# Patient Record
Sex: Female | Born: 1960 | Race: Black or African American | Hispanic: No | Marital: Married | Smoking: Current every day smoker
Health system: Southern US, Community
[De-identification: ages and names within clinical notes are randomized; demographics above are authoritative.]

## PROBLEM LIST (undated history)

## (undated) DIAGNOSIS — R519 Headache, unspecified: Secondary | ICD-10-CM

## (undated) DIAGNOSIS — G629 Polyneuropathy, unspecified: Secondary | ICD-10-CM

## (undated) DIAGNOSIS — D649 Anemia, unspecified: Secondary | ICD-10-CM

## (undated) DIAGNOSIS — M1990 Unspecified osteoarthritis, unspecified site: Secondary | ICD-10-CM

## (undated) DIAGNOSIS — I10 Essential (primary) hypertension: Secondary | ICD-10-CM

## (undated) DIAGNOSIS — C50919 Malignant neoplasm of unspecified site of unspecified female breast: Secondary | ICD-10-CM

## (undated) DIAGNOSIS — R51 Headache: Secondary | ICD-10-CM

## (undated) DIAGNOSIS — E079 Disorder of thyroid, unspecified: Secondary | ICD-10-CM

## (undated) DIAGNOSIS — Z923 Personal history of irradiation: Secondary | ICD-10-CM

## (undated) HISTORY — DX: Unspecified osteoarthritis, unspecified site: M19.90

## (undated) HISTORY — PX: DILATION AND CURETTAGE OF UTERUS: SHX78

## (undated) HISTORY — PX: NOVASURE ABLATION: SHX5394

## (undated) HISTORY — DX: Disorder of thyroid, unspecified: E07.9

## (undated) HISTORY — DX: Polyneuropathy, unspecified: G62.9

## (undated) HISTORY — PX: ANTERIOR CRUCIATE LIGAMENT REPAIR: SHX115

## (undated) HISTORY — PX: MASTECTOMY: SHX3

## (undated) HISTORY — DX: Anemia, unspecified: D64.9

---

## 2005-03-24 ENCOUNTER — Ambulatory Visit: Payer: Self-pay | Admitting: Unknown Physician Specialty

## 2005-04-20 ENCOUNTER — Ambulatory Visit: Payer: Self-pay | Admitting: Internal Medicine

## 2005-04-30 ENCOUNTER — Ambulatory Visit: Payer: Self-pay | Admitting: Internal Medicine

## 2010-04-28 ENCOUNTER — Ambulatory Visit: Payer: Self-pay | Admitting: Internal Medicine

## 2010-05-11 ENCOUNTER — Ambulatory Visit: Payer: Self-pay | Admitting: Internal Medicine

## 2011-06-15 ENCOUNTER — Ambulatory Visit: Payer: Self-pay | Admitting: Internal Medicine

## 2012-06-15 ENCOUNTER — Ambulatory Visit: Payer: Self-pay | Admitting: Internal Medicine

## 2012-11-01 HISTORY — PX: COLONOSCOPY W/ POLYPECTOMY: SHX1380

## 2013-06-19 ENCOUNTER — Ambulatory Visit: Payer: Self-pay | Admitting: Internal Medicine

## 2013-10-18 ENCOUNTER — Ambulatory Visit: Payer: Self-pay | Admitting: Anesthesiology

## 2013-10-18 DIAGNOSIS — I10 Essential (primary) hypertension: Secondary | ICD-10-CM

## 2013-10-18 LAB — POTASSIUM: Potassium: 3.6 mmol/L (ref 3.5–5.1)

## 2013-10-19 ENCOUNTER — Ambulatory Visit: Payer: Self-pay | Admitting: Obstetrics and Gynecology

## 2013-10-19 DIAGNOSIS — R079 Chest pain, unspecified: Secondary | ICD-10-CM

## 2014-04-15 ENCOUNTER — Ambulatory Visit: Payer: Self-pay | Admitting: Gastroenterology

## 2014-05-23 ENCOUNTER — Ambulatory Visit: Payer: Self-pay | Admitting: Internal Medicine

## 2014-11-01 DIAGNOSIS — Z923 Personal history of irradiation: Secondary | ICD-10-CM

## 2014-11-01 HISTORY — DX: Personal history of irradiation: Z92.3

## 2015-02-21 NOTE — Op Note (Signed)
PATIENT NAME:  Michelle Walker, Michelle Walker MR#:  [id number] DATE OF BIRTH:  1961-09-08  DATE OF PROCEDURE:  10/19/2013  PREOPERATIVE DIAGNOSIS: Menorrhagia and anemia.   POSTOPERATIVE DIAGNOSIS: Menorrhagia and anemia.   PROCEDURE:  NovaSure endometrial ablation.   SURGEON: Elgie Collard, M.D.   ASSISTANT: None.   COMPLICATIONS: None.   ESTIMATED BLOOD LOSS: None.   FINDINGS: Normal uterine cavity. Cavity width was 3 cm, cervical length was 4 cm, cavity length 4 cm, sounding length 8 cm, the time for ablation was 2 minutes at a power of 66 watts. The patient was taken to the OR where she was prepped and draped in the usual sterile fashion in the dorsal supine lithotomy position using bumblebee stirrups. A side-opening speculum was placed in the vagina. The cervix was stabilized using a single-tooth tenaculum. The cervix was serially dilated after sounding and the NovaSure instrument was placed into the endometrial cavity. The cavity width was obtained and CO2 cavity assessment was performed and the cavity was patent. The NovaSure console was deployed and the ablation proceeded without complications. Time 2 minutes, power of 66 watts. At the conclusion of the procedure, the NovaSure instrument was removed from the uterine cavity. All other instruments were removed from the vagina. The cervix was noted to be hemostatic. The patient was returned to the dorsal supine position. She was awakened from anesthesia in the operating room in stable condition and returned to the PACU for postoperative care. Discharge to home with office follow-up.    ____________________________ Shelby Mattocks. Georga Bora, MD [id number] D: 10/19/2013 12:46:11 ET T: 10/19/2013 13:17:09 ET JOB#: [id number]  cc: Shelby Mattocks. Georga Bora, MD, <Dictator>

## 2015-05-27 ENCOUNTER — Other Ambulatory Visit: Payer: Self-pay | Admitting: Internal Medicine

## 2015-05-27 DIAGNOSIS — Z1231 Encounter for screening mammogram for malignant neoplasm of breast: Secondary | ICD-10-CM

## 2015-06-03 ENCOUNTER — Ambulatory Visit: Payer: Managed Care, Other (non HMO) | Attending: Internal Medicine

## 2015-06-24 ENCOUNTER — Ambulatory Visit
Admission: RE | Admit: 2015-06-24 | Discharge: 2015-06-24 | Disposition: A | Discharge status: Home / Self Care | Payer: Managed Care, Other (non HMO) | Source: Ambulatory Visit | Attending: Internal Medicine | Admitting: Internal Medicine

## 2015-06-24 DIAGNOSIS — Z1231 Encounter for screening mammogram for malignant neoplasm of breast: Secondary | ICD-10-CM | POA: Insufficient documentation

## 2015-06-24 DIAGNOSIS — R921 Mammographic calcification found on diagnostic imaging of breast: Secondary | ICD-10-CM | POA: Diagnosis not present

## 2015-06-27 ENCOUNTER — Other Ambulatory Visit: Payer: Self-pay | Admitting: Internal Medicine

## 2015-06-27 DIAGNOSIS — R928 Other abnormal and inconclusive findings on diagnostic imaging of breast: Secondary | ICD-10-CM

## 2015-06-27 DIAGNOSIS — R921 Mammographic calcification found on diagnostic imaging of breast: Secondary | ICD-10-CM

## 2015-07-04 ENCOUNTER — Ambulatory Visit
Admission: RE | Admit: 2015-07-04 | Discharge: 2015-07-04 | Disposition: A | Discharge status: Home / Self Care | Payer: Managed Care, Other (non HMO) | Source: Ambulatory Visit | Attending: Internal Medicine | Admitting: Internal Medicine

## 2015-07-04 DIAGNOSIS — R928 Other abnormal and inconclusive findings on diagnostic imaging of breast: Secondary | ICD-10-CM | POA: Diagnosis present

## 2015-07-04 DIAGNOSIS — R921 Mammographic calcification found on diagnostic imaging of breast: Secondary | ICD-10-CM | POA: Insufficient documentation

## 2015-07-08 ENCOUNTER — Other Ambulatory Visit: Payer: Self-pay | Admitting: Internal Medicine

## 2015-07-08 DIAGNOSIS — R921 Mammographic calcification found on diagnostic imaging of breast: Secondary | ICD-10-CM

## 2015-07-08 DIAGNOSIS — R928 Other abnormal and inconclusive findings on diagnostic imaging of breast: Secondary | ICD-10-CM

## 2015-07-15 ENCOUNTER — Ambulatory Visit: Payer: Managed Care, Other (non HMO)

## 2015-07-17 ENCOUNTER — Ambulatory Visit
Admission: RE | Admit: 2015-07-17 | Discharge: 2015-07-17 | Disposition: A | Discharge status: Home / Self Care | Payer: Managed Care, Other (non HMO) | Source: Ambulatory Visit | Attending: Internal Medicine | Admitting: Internal Medicine

## 2015-07-17 DIAGNOSIS — R921 Mammographic calcification found on diagnostic imaging of breast: Secondary | ICD-10-CM

## 2015-07-17 DIAGNOSIS — R928 Other abnormal and inconclusive findings on diagnostic imaging of breast: Secondary | ICD-10-CM | POA: Insufficient documentation

## 2015-07-17 HISTORY — PX: BREAST BIOPSY: SHX20

## 2015-07-28 ENCOUNTER — Encounter: Payer: Self-pay | Admitting: General Surgery

## 2015-07-29 ENCOUNTER — Telehealth: Payer: Self-pay

## 2015-07-29 NOTE — Telephone Encounter (Signed)
  Oncology Nurse Navigator Documentation    Navigator Encounter Type: Introductory phone call (07/29/15 1700) Patient Visit Type: Initial (07/29/15 1700)   Barriers/Navigation Needs: Education (07/29/15 1700)   Interventions: Coordination of Care;Education Method (Breast Cancer Treatment Handbook) (07/29/15 1700)   Coordination of Care: MD Appointments (07/29/15 1700) Education Method: Verbal (07/29/15 1700)      Time Spent with Patient: 15 (07/29/15 1700)  Phoned patient to introduce Navigation service.  She is scheduled for surgical consult on 08/04/15 with Dr Jamal Collin. Appointment is at 3:00 p.m.   Risk assessment completed for case conference.  Per patient request Breast Cancer Treatment Handbook delivered to [facility] to pick up at consult visit.

## 2015-08-02 DIAGNOSIS — C50919 Malignant neoplasm of unspecified site of unspecified female breast: Secondary | ICD-10-CM

## 2015-08-02 HISTORY — DX: Malignant neoplasm of unspecified site of unspecified female breast: C50.919

## 2015-08-04 ENCOUNTER — Ambulatory Visit (INDEPENDENT_AMBULATORY_CARE_PROVIDER_SITE_OTHER): Payer: Managed Care, Other (non HMO) | Admitting: General Surgery

## 2015-08-04 ENCOUNTER — Encounter: Payer: Self-pay | Admitting: General Surgery

## 2015-08-04 DIAGNOSIS — C50412 Malignant neoplasm of upper-outer quadrant of left female breast: Secondary | ICD-10-CM | POA: Insufficient documentation

## 2015-08-04 DIAGNOSIS — Z170 Estrogen receptor positive status [ER+]: Secondary | ICD-10-CM | POA: Insufficient documentation

## 2015-08-04 DIAGNOSIS — C50411 Malignant neoplasm of upper-outer quadrant of right female breast: Secondary | ICD-10-CM

## 2015-08-04 LAB — SURGICAL PATHOLOGY

## 2015-08-04 NOTE — Progress Notes (Signed)
Patient ID: Michelle Walker, female   DOB: 04-29-1961, 53 y.o.   MRN: [id number]  Chief Complaint  Patient presents with  . Breast Cancer    HPI Michelle Walker is a 54 y.o. female here today for an evaluation for right breast cancer, she had a mammogram 06/24/15 and a  right breast stereotactic biopsy done 07/24/15. She states she did not feel any lumps or tenderness prior to getting her yearly checkup.  She states that sometimes she has to have two mammograms a year. 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  HPI  Past Medical History  Diagnosis Date  . Arthritis   . Anemia   . Thyroid disease   . Hypertension   . Cancer [facility])     Past Surgical History  Procedure Laterality Date  . Breast biopsy Right 07/17/2015    path pending  . Colonoscopy w/ polypectomy  2014  . Dilation and curettage of uterus      20 years ago    Family History  Problem Relation Age of Onset  . Stroke Mother   . Cancer Sister     sarcoma on arm; chemo    Social History Social History  Substance Use Topics  . Smoking status: Current Every Day Smoker -- 0.50 packs/day for 35 years    Types: Cigarettes  . Smokeless tobacco: Never Used  . Alcohol Use: 1.2 oz/week    2 Standard drinks or equivalent per week    No Known Allergies  Current Outpatient Prescriptions  Medication Sig Dispense Refill  . amLODipine (NORVASC) 10 MG tablet Take 10 mg by mouth at bedtime.   0  . losartan (COZAAR) 100 MG tablet Take 100 mg by mouth at bedtime.   0  . metoprolol succinate (TOPROL-XL) 25 MG 24 hr tablet Take 25 mg by mouth at bedtime.   0  . Vitamin D, Ergocalciferol, (DRISDOL) 50000 UNITS CAPS capsule Take 50,000 Units by mouth every 7 (seven) days. On Thursday  0  . allopurinol (ZYLOPRIM) 100 MG tablet Take 100 mg by mouth at bedtime.    [name] HYDROcodone-acetaminophen (NORCO/VICODIN) 5-325 MG tablet Take 1 tablet by mouth every 4 (four) hours as needed for moderate pain. 20 tablet 0    No current facility-administered medications for this visit.    Review of Systems Review of Systems  Constitutional: Negative.   Respiratory: Negative.   Cardiovascular: Negative.     Blood pressure 140/70, pulse 82, resp. rate 12, height 5' 5" (1.651 m), weight 190 lb (86.183 kg).  Physical Exam Physical Exam  Constitutional: She is oriented to person, place, and time. She appears well-developed and well-nourished.  Eyes: Conjunctivae are normal. No scleral icterus.  Neck: Neck supple.  Cardiovascular: Normal rate, regular rhythm and normal heart sounds.   Pulmonary/Chest: Effort normal and breath sounds normal. Right breast exhibits no inverted nipple, no mass, no nipple discharge, no skin change and no tenderness. Left breast exhibits no inverted nipple, no mass, no nipple discharge, no skin change and no tenderness.  Abdominal: Soft. Bowel sounds are normal.  Lymphadenopathy:    She has no cervical adenopathy.    She has no axillary adenopathy.  Neurological: She is alert and oriented to person, place, and time.  Skin: Skin is warm and dry.    Data Reviewed Mammogram and pathology report invasive lobular cancer. Imaging size is 7-56m. T1b,N0 Assessment    Right breast invasive lobular breast cancer, ER+/PR+. Her2 results  being faxed today.    Plan   Traeatment options discussed. Lumpectomy and sentinel node biopsy of right breast recommended, followed by radiation and antihormonal therapy Additional treatment dependent on Her2 results.       PCP: Michelle Carls, MD   Michelle Walker 08/05/2015, 1:11 PM

## 2015-08-04 NOTE — Patient Instructions (Signed)
Lumpectomy A lumpectomy is a form of "breast conserving" or "breast preservation" surgery. It may also be referred to as a partial mastectomy. During a lumpectomy, the portion of the breast that contains the cancerous tumor or breast mass (the lump) is removed. Some normal tissue around the lump may also be removed to make sure all of the tumor has been removed.  LET YOUR HEALTH CARE PROVIDER KNOW ABOUT:  Any allergies you have.  All medicines you are taking, including vitamins, herbs, eye drops, creams, and over-the-counter medicines.  Previous problems you or members of your family have had with the use of anesthetics.  Any blood disorders you have.  Previous surgeries you have had.  Medical conditions you have. RISKS AND COMPLICATIONS Generally, this is a safe procedure. However, problems can occur and include:  Bleeding.  Infection.  Pain.  Temporary swelling.  Change in the shape of the breast, particularly if a large portion is removed. BEFORE THE PROCEDURE  Ask your health care provider about changing or stopping your regular medicines. This is especially important if you are taking diabetes medicines or blood thinners.  Do not eat or drink anything after midnight on the night before the procedure or as directed by your health care provider. Ask your health care provider if you can take a sip of water with any approved medicines.  On the day of surgery, your health care provider will use a mammogram or ultrasound to locate and mark the tumor in your breast. These markings on your breast will show where the cut (incision) will be made. PROCEDURE   An IV tube will be put into one of your veins.  You may be given medicine to help you relax before the surgery (sedative). You will be given one of the following:  A medicine that numbs the area (local anesthetic).  A medicine that makes you fall asleep (general anesthetic).  Your health care provider will use a kind of  electric scalpel that uses heat to minimize bleeding (electrocautery knife).  A curved incision (like a smile or frown) that follows the natural curve of your breast is made, to allow for minimal scarring and better healing.  The tumor will be removed with some of the surrounding tissue. This will be sent to the lab for analysis. Your health care provider may also remove your lymph nodes at this time if needed.  Sometimes, but not always, a rubber tube called a drain will be surgically inserted into your breast area or armpit to collect excess fluid that may accumulate in the space where the tumor was. This drain is connected to a plastic bulb on the outside of your body. This drain creates suction to help remove the fluid.  The incisions will be closed with stitches (sutures).  A bandage may be placed over the incisions. AFTER THE PROCEDURE  You will be taken to the recovery area.  You will be given medicine for pain.  A small rubber drain may be placed in the breast for 2-3 days to prevent a collection of blood (hematoma) from developing in the breast. You will be given instructions on caring for the drain before you go home.  A pressure bandage (dressing) will be applied for 1-2 days to prevent bleeding. Ask your health care provider how to care for your bandage at home. Document Released: 11/29/2006 Document Revised: 03/04/2014 Document Reviewed: 03/23/2013 ExitCare Patient Information 2015 [employer]. This information is not intended to replace advice given to you by   your health care provider. Make sure you discuss any questions you have with your health care provider.  

## 2015-08-05 ENCOUNTER — Encounter: Payer: Self-pay | Admitting: General Surgery

## 2015-08-05 ENCOUNTER — Encounter: Payer: Self-pay | Admitting: Emergency Medicine

## 2015-08-05 ENCOUNTER — Emergency Department
Admission: EM | Admit: 2015-08-05 | Discharge: 2015-08-05 | Disposition: A | Discharge status: Home / Self Care | Payer: Managed Care, Other (non HMO) | Attending: Emergency Medicine | Admitting: Emergency Medicine

## 2015-08-05 ENCOUNTER — Emergency Department: Payer: Managed Care, Other (non HMO)

## 2015-08-05 DIAGNOSIS — R1032 Left lower quadrant pain: Secondary | ICD-10-CM | POA: Diagnosis present

## 2015-08-05 DIAGNOSIS — R109 Unspecified abdominal pain: Secondary | ICD-10-CM

## 2015-08-05 DIAGNOSIS — Z79899 Other long term (current) drug therapy: Secondary | ICD-10-CM | POA: Diagnosis not present

## 2015-08-05 DIAGNOSIS — Z720 Tobacco use: Secondary | ICD-10-CM | POA: Diagnosis not present

## 2015-08-05 HISTORY — DX: Essential (primary) hypertension: I10

## 2015-08-05 LAB — COMPREHENSIVE METABOLIC PANEL
ALT: 12 U/L — ABNORMAL LOW (ref 14–54)
AST: 23 U/L (ref 15–41)
Albumin: 3.5 g/dL (ref 3.5–5.0)
Alkaline Phosphatase: 61 U/L (ref 38–126)
Anion gap: 3 — ABNORMAL LOW (ref 5–15)
BUN: 14 mg/dL (ref 6–20)
CO2: 23 mmol/L (ref 22–32)
Calcium: 9 mg/dL (ref 8.9–10.3)
Chloride: 111 mmol/L (ref 101–111)
Creatinine, Ser: 0.77 mg/dL (ref 0.44–1.00)
GFR calc Af Amer: >60 mL/min (ref >60)
GFR calc non Af Amer: >60 mL/min (ref >60)
Glucose, Bld: 100 mg/dL — ABNORMAL HIGH (ref 65–99)
Potassium: 3.8 mmol/L (ref 3.5–5.1)
Sodium: 137 mmol/L (ref 135–145)
Total Bilirubin: 0.5 mg/dL (ref 0.3–1.2)
Total Protein: 6.8 g/dL (ref 6.5–8.1)

## 2015-08-05 LAB — URINALYSIS COMPLETE WITH MICROSCOPIC (ARMC ONLY)
Bilirubin Urine: NEGATIVE
Glucose, UA: NEGATIVE mg/dL
Hgb urine dipstick: NEGATIVE
Ketones, ur: NEGATIVE mg/dL
Leukocytes, UA: NEGATIVE
Nitrite: NEGATIVE
Protein, ur: NEGATIVE mg/dL
RBC / HPF: NONE SEEN RBC/hpf (ref 0–5)
Specific Gravity, Urine: 1.019 (ref 1.005–1.030)
pH: 5 (ref 5.0–8.0)

## 2015-08-05 LAB — CBC WITH DIFFERENTIAL/PLATELET
Basophils Absolute: 0 10*3/uL (ref 0–0.1)
Basophils Relative: 1 %
Eosinophils Absolute: 0.2 10*3/uL (ref 0–0.7)
Eosinophils Relative: 3 %
HCT: 39.4 % (ref 35.0–47.0)
Hemoglobin: 12.9 g/dL (ref 12.0–16.0)
Lymphocytes Relative: 29 %
Lymphs Abs: 1.9 10*3/uL (ref 1.0–3.6)
MCH: 27 pg (ref 26.0–34.0)
MCHC: 32.8 g/dL (ref 32.0–36.0)
MCV: 82.5 fL (ref 80.0–100.0)
Monocytes Absolute: 0.6 10*3/uL (ref 0.2–0.9)
Monocytes Relative: 9 %
Neutro Abs: 4 10*3/uL (ref 1.4–6.5)
Neutrophils Relative %: 58 %
Platelets: 172 10*3/uL (ref 150–440)
RBC: 4.78 MIL/uL (ref 3.80–5.20)
RDW: 13.2 % (ref 11.5–14.5)
WBC: 6.7 10*3/uL (ref 3.6–11.0)

## 2015-08-05 LAB — LIPASE, BLOOD: Lipase: 36 U/L (ref 22–51)

## 2015-08-05 MED ORDER — HYDROCODONE-ACETAMINOPHEN 5-325 MG PO TABS: 1.0000 | ORAL_TABLET | ORAL | Status: DC | PRN

## 2015-08-05 MED ORDER — ONDANSETRON HCL 4 MG/2ML IJ SOLN
4.0000 mg | Freq: Once | INTRAMUSCULAR | Status: AC
  Administered 2015-08-05: 4 mg via INTRAVENOUS
  Filled 2015-08-05: qty 2

## 2015-08-05 MED ORDER — MORPHINE SULFATE (PF) 4 MG/ML IV SOLN
4.0000 mg | Freq: Once | INTRAVENOUS | Status: AC
  Administered 2015-08-05: 4 mg via INTRAVENOUS
  Filled 2015-08-05: qty 1

## 2015-08-05 MED ORDER — IOHEXOL 240 MG/ML SOLN
25.0000 mL | Freq: Once | INTRAMUSCULAR | Status: AC | PRN
  Administered 2015-08-05: 25 mL via ORAL

## 2015-08-05 MED ORDER — IOHEXOL 300 MG/ML  SOLN
100.0000 mL | Freq: Once | INTRAMUSCULAR | Status: AC | PRN
  Administered 2015-08-05: 100 mL via INTRAVENOUS

## 2015-08-05 NOTE — ED Provider Notes (Signed)
Assumed care from Dr. Archie Balboa. He has been follow-up on the CT scan. CT scan shows no acute abnormalities. I discussed with the patient that cause of her pain is unclear although she reports she has no pain now. I just we do a pelvic ultrasound which she declined and stated she would rather go home and rest. She will return to the ED if her pain returns.  Michelle Drafts, MD 08/05/15 [phone]

## 2015-08-05 NOTE — ED Notes (Signed)
Pt arrived to the ED via EMS from work for severe abdominal pain. Pt states that she went to the bathroom and after she was done, she began to experience LLQ abdominal pain rated at a 10 in a 0-10 pain scale. Pt states that she never had kidney stones but was diagnosed about 1 year ago with gallbladder stones. Pt is AOx4 in severe pain distress. MD notified.

## 2015-08-05 NOTE — ED Provider Notes (Signed)
[facility] Emergency Department Provider Note   ____________________________________________  Time seen: 0525  I have reviewed the triage vital signs and the nursing notes.   HISTORY  Chief Complaint Abdominal Pain   History limited by: Not Limited   HPI Michelle Walker is a 54 y.o. female who presents to the emergency department today with concerns for left lower quadrant pain. The patient states that this pain started abruptly. She states she just finished a bowel movement. She describes the pain as sharp. It is located in the left lower quadrant with some radiation to the rectum. She denies any abnormality about her bowel movement. She denies any blood or diarrhea with the bowel movement. She denies having similar pain in the past. She denies any nausea or vomiting.No recent fevers.    Past Medical History  Diagnosis Date  . Arthritis   . Anemia   . Thyroid disease     Patient Active Problem List   Diagnosis Date Noted  . Breast cancer of upper-outer quadrant of right female breast ([place]) 08/04/2015    Past Surgical History  Procedure Laterality Date  . Breast biopsy Right 07/17/2015    path pending  . Colonoscopy w/ polypectomy  2014  . Dilation and curettage of uterus      20 years ago    Current Outpatient Rx  Name  Route  Sig  Dispense  Refill  . amLODipine (NORVASC) 10 MG tablet   Oral   Take 10 mg by mouth daily.      0   . losartan (COZAAR) 100 MG tablet   Oral   Take 100 mg by mouth daily.       0   . metoprolol succinate (TOPROL-XL) 25 MG 24 hr tablet   Oral   Take 25 mg by mouth daily.       0   . Vitamin D, Ergocalciferol, (DRISDOL) 50000 UNITS CAPS capsule      50,000 Units every 7 (seven) days.       0     Allergies Review of patient's allergies indicates no known allergies.  Family History  Problem Relation Age of Onset  . Stroke Mother   . Cancer Sister     sarcoma on arm; chemo    Social  History Social History  Substance Use Topics  . Smoking status: Current Every Day Smoker -- 0.50 packs/day for 35 years    Types: Cigarettes  . Smokeless tobacco: Never Used  . Alcohol Use: 1.2 oz/week    2 Standard drinks or equivalent per week    Review of Systems  Constitutional: Negative for fever. Cardiovascular: Negative for chest pain. Respiratory: Negative for shortness of breath. Gastrointestinal: Positive for left lower quadrant pain  Genitourinary: Negative for dysuria. Musculoskeletal: Negative for back pain. Skin: Negative for rash. Neurological: Negative for headaches, focal weakness or numbness.   10-point ROS otherwise negative.  ____________________________________________   PHYSICAL EXAM:  VITAL SIGNS: ED Triage Vitals  Enc Vitals Group     BP 08/05/15 0508 124/84 mmHg     Pulse Rate 08/05/15 0508 86     Resp 08/05/15 0508 18     Temp 08/05/15 0508 98.1 F (36.7 C)     Temp Source 08/05/15 0508 Oral     SpO2 08/05/15 0508 97 %     Weight 08/05/15 0508 190 lb (86.183 kg)     Height 08/05/15 0508 5\' 6"  (1.676 m)     Head Cir --  Peak Flow --      Pain Score 08/05/15 0509 8   Constitutional: Alert and oriented. Well appearing and in no distress. Eyes: Conjunctivae are normal. PERRL. Normal extraocular movements. ENT   Head: Normocephalic and atraumatic.   Nose: No congestion/rhinnorhea.   Mouth/Throat: Mucous membranes are moist.   Neck: No stridor. Hematological/Lymphatic/Immunilogical: No cervical lymphadenopathy. Cardiovascular: Normal rate, regular rhythm.  No murmurs, rubs, or gallops. Respiratory: Normal respiratory effort without tachypnea nor retractions. Breath sounds are clear and equal bilaterally. No wheezes/rales/rhonchi. Gastrointestinal: Soft and tender to palpation of the left lower quadrant. No distention. Genitourinary: Deferred Musculoskeletal: Normal range of motion in all extremities. No joint effusions.  No  lower extremity tenderness nor edema. Neurologic:  Normal speech and language. No gross focal neurologic deficits are appreciated. Speech is normal.  Skin:  Skin is warm, dry and intact. No rash noted. Psychiatric: Mood and affect are normal. Speech and behavior are normal. Patient exhibits appropriate insight and judgment.  ____________________________________________    LABS (pertinent positives/negatives)  Labs Reviewed  COMPREHENSIVE METABOLIC PANEL - Abnormal; Notable for the following:    Glucose, Bld 100 (*)    ALT 12 (*)    Anion gap 3 (*)    All other components within normal limits  URINALYSIS COMPLETEWITH MICROSCOPIC ([facility] ONLY) - Abnormal; Notable for the following:    Color, Urine YELLOW (*)    APPearance CLEAR (*)    Bacteria, UA RARE (*)    Squamous Epithelial / LPF 0-5 (*)    All other components within normal limits  CBC WITH DIFFERENTIAL/PLATELET  LIPASE, BLOOD     ____________________________________________   EKG  None  ____________________________________________    RADIOLOGY  CT scan pending ____________________________________________   PROCEDURES  Procedure(s) performed: None  Critical Care performed: No  ____________________________________________   INITIAL IMPRESSION / ASSESSMENT AND PLAN / ED COURSE  Pertinent labs & imaging results that were available during my care of the patient were reviewed by me and considered in my medical decision making (see chart for details).  Patient presents to the emergency department today with a left lower quadrant abdominal pain. Urine without any red blood cells or white Cells. Blood Results without Any concerning Findings. However Given the Significance of Pain Will Plan on Getting a CT Scan to Evaluate for Possible Diverticulitis or other intraabdominal source of pain.  ____________________________________________   FINAL CLINICAL IMPRESSION(S) / ED DIAGNOSES  Abdominal pain  Nance Pear, MD 08/05/15 [phone]

## 2015-08-05 NOTE — Discharge Instructions (Signed)
Abdominal Pain, Women °Abdominal (stomach, pelvic, or belly) pain can be caused by many things. It is important to tell your doctor: °· The location of the pain. °· Does it come and go or is it present all the time? °· Are there things that start the pain (eating certain foods, exercise)? °· Are there other symptoms associated with the pain (fever, nausea, vomiting, diarrhea)? °All of this is helpful to know when trying to find the cause of the pain. °CAUSES  °· Stomach: virus or bacteria infection, or ulcer. °· Intestine: appendicitis (inflamed appendix), regional ileitis (Crohn's disease), ulcerative colitis (inflamed colon), irritable bowel syndrome, diverticulitis (inflamed diverticulum of the colon), or cancer of the stomach or intestine. °· Gallbladder disease or stones in the gallbladder. °· Kidney disease, kidney stones, or infection. °· Pancreas infection or cancer. °· Fibromyalgia (pain disorder). °· Diseases of the female organs: °¨ Uterus: fibroid (non-cancerous) tumors or infection. °¨ Fallopian tubes: infection or tubal pregnancy. °¨ Ovary: cysts or tumors. °¨ Pelvic adhesions (scar tissue). °¨ Endometriosis (uterus lining tissue growing in the pelvis and on the pelvic organs). °¨ Pelvic congestion syndrome (female organs filling up with blood just before the menstrual period). °¨ Pain with the menstrual period. °¨ Pain with ovulation (producing an egg). °¨ Pain with an IUD (intrauterine device, birth control) in the uterus. °¨ Cancer of the female organs. °· Functional pain (pain not caused by a disease, may improve without treatment). °· Psychological pain. °· Depression. °DIAGNOSIS  °Your doctor will decide the seriousness of your pain by doing an examination. °· Blood tests. °· X-rays. °· Ultrasound. °· CT scan (computed tomography, special type of X-ray). °· MRI (magnetic resonance imaging). °· Cultures, for infection. °· Barium enema (dye inserted in the large intestine, to better view it with  X-rays). °· Colonoscopy (looking in intestine with a lighted tube). °· Laparoscopy (minor surgery, looking in abdomen with a lighted tube). °· Major abdominal exploratory surgery (looking in abdomen with a large incision). °TREATMENT  °The treatment will depend on the cause of the pain.  °· Many cases can be observed and treated at home. °· Over-the-counter medicines recommended by your caregiver. °· Prescription medicine. °· Antibiotics, for infection. °· Birth control pills, for painful periods or for ovulation pain. °· Hormone treatment, for endometriosis. °· Nerve blocking injections. °· Physical therapy. °· Antidepressants. °· Counseling with a psychologist or psychiatrist. °· Minor or major surgery. °HOME CARE INSTRUCTIONS  °· Do not take laxatives, unless directed by your caregiver. °· Take over-the-counter pain medicine only if ordered by your caregiver. Do not take aspirin because it can cause an upset stomach or bleeding. °· Try a clear liquid diet (broth or water) as ordered by your caregiver. Slowly move to a bland diet, as tolerated, if the pain is related to the stomach or intestine. °· Have a thermometer and take your temperature several times a day, and record it. °· Bed rest and sleep, if it helps the pain. °· Avoid sexual intercourse, if it causes pain. °· Avoid stressful situations. °· Keep your follow-up appointments and tests, as your caregiver orders. °· If the pain does not go away with medicine or surgery, you may try: °¨ Acupuncture. °¨ Relaxation exercises (yoga, meditation). °¨ Group therapy. °¨ Counseling. °SEEK MEDICAL CARE IF:  °· You notice certain foods cause stomach pain. °· Your home care treatment is not helping your pain. °· You need stronger pain medicine. °· You want your IUD removed. °· You feel faint or   lightheaded. °· You develop nausea and vomiting. °· You develop a rash. °· You are having side effects or an allergy to your medicine. °SEEK IMMEDIATE MEDICAL CARE IF:  °· Your  pain does not go away or gets worse. °· You have a fever. °· Your pain is felt only in portions of the abdomen. The right side could possibly be appendicitis. The left lower portion of the abdomen could be colitis or diverticulitis. °· You are passing blood in your stools (bright red or black tarry stools, with or without vomiting). °· You have blood in your urine. °· You develop chills, with or without a fever. °· You pass out. °MAKE SURE YOU:  °· Understand these instructions. °· Will watch your condition. °· Will get help right away if you are not doing well or get worse. °Document Released: 08/15/2007 Document Revised: 03/04/2014 Document Reviewed: 09/04/2009 °ExitCare® Patient Information ©2015 [employer]. This information is not intended to replace advice given to you by your health care provider. Make sure you discuss any questions you have with your health care provider. ° °

## 2015-08-05 NOTE — ED Notes (Signed)
Pt resting in bed.

## 2015-08-06 ENCOUNTER — Other Ambulatory Visit: Payer: Self-pay | Admitting: General Surgery

## 2015-08-06 DIAGNOSIS — C50411 Malignant neoplasm of upper-outer quadrant of right female breast: Secondary | ICD-10-CM

## 2015-08-08 ENCOUNTER — Other Ambulatory Visit: Payer: Self-pay | Admitting: General Surgery

## 2015-08-08 DIAGNOSIS — C50411 Malignant neoplasm of upper-outer quadrant of right female breast: Secondary | ICD-10-CM

## 2015-08-13 MED ORDER — LIDOCAINE HCL (PF) 1 % IJ SOLN
INTRAMUSCULAR | Status: AC
  Filled 2015-08-13: qty 5

## 2015-08-14 ENCOUNTER — Other Ambulatory Visit: Payer: Managed Care, Other (non HMO)

## 2015-08-18 ENCOUNTER — Encounter: Payer: Self-pay | Admitting: *Deleted

## 2015-08-18 NOTE — Patient Instructions (Signed)
  Your procedure is scheduled on: 08-22-15 Report to [facility] (2ND DESK ON RIGHT) @ 8 AM   Remember: Instructions that are not followed completely may result in serious medical risk, up to and including death, or upon the discretion of your surgeon and anesthesiologist your surgery may need to be rescheduled.    _X___ 1. Do not eat food or drink liquids after midnight. No gum chewing or hard candies.     _X___ 2. No Alcohol for 24 hours before or after surgery.   ____ 3. Bring all medications with you on the day of surgery if instructed.    _X___ 4. Notify your doctor if there is any change in your medical condition     (cold, fever, infections).     Do not wear jewelry, make-up, hairpins, clips or nail polish.  Do not wear lotions, powders, or perfumes. You may wear deodorant.  Do not shave 48 hours prior to surgery. Men may shave face and neck.  Do not bring valuables to the hospital.    [facility] is not responsible for any belongings or valuables.               Contacts, dentures or bridgework may not be worn into surgery.  Leave your suitcase in the car. After surgery it may be brought to your room.  For patients admitted to the hospital, discharge time is determined by your treatment team.   Patients discharged the day of surgery will not be allowed to drive home.   Please read over the following fact sheets that you were given:      ____ Take these medicines the morning of surgery with A SIP OF WATER:    1. NONE  2.   3.   4.  5.  6.  ____ Fleet Enema (as directed)   ____ Use CHG Soap as directed  ____ Use inhalers on the day of surgery  ____ Stop metformin 2 days prior to surgery    ____ Take 1/2 of usual insulin dose the night before surgery and none on the morning of surgery.   ____ Stop Coumadin/Plavix/aspirin-N/A  ____ Stop Anti-inflammatories-NO NSAIDS OR ASPIRIN PRODUCTS-TYLENOL OK   ____ Stop supplements until after surgery.     ____ Bring C-Pap to the hospital.

## 2015-08-19 ENCOUNTER — Encounter
Admission: RE | Admit: 2015-08-19 | Discharge: 2015-08-19 | Disposition: A | Discharge status: Home / Self Care | Payer: Managed Care, Other (non HMO) | Source: Ambulatory Visit | Attending: Anesthesiology | Admitting: Anesthesiology

## 2015-08-19 DIAGNOSIS — Z823 Family history of stroke: Secondary | ICD-10-CM | POA: Diagnosis not present

## 2015-08-19 DIAGNOSIS — E079 Disorder of thyroid, unspecified: Secondary | ICD-10-CM | POA: Diagnosis not present

## 2015-08-19 DIAGNOSIS — F17210 Nicotine dependence, cigarettes, uncomplicated: Secondary | ICD-10-CM | POA: Diagnosis not present

## 2015-08-19 DIAGNOSIS — I10 Essential (primary) hypertension: Secondary | ICD-10-CM | POA: Diagnosis not present

## 2015-08-19 DIAGNOSIS — D0501 Lobular carcinoma in situ of right breast: Secondary | ICD-10-CM | POA: Diagnosis not present

## 2015-08-19 DIAGNOSIS — Z79899 Other long term (current) drug therapy: Secondary | ICD-10-CM | POA: Diagnosis not present

## 2015-08-19 DIAGNOSIS — Z808 Family history of malignant neoplasm of other organs or systems: Secondary | ICD-10-CM | POA: Diagnosis not present

## 2015-08-19 DIAGNOSIS — Z86010 Personal history of colonic polyps: Secondary | ICD-10-CM | POA: Diagnosis not present

## 2015-08-19 DIAGNOSIS — N6091 Unspecified benign mammary dysplasia of right breast: Secondary | ICD-10-CM | POA: Diagnosis not present

## 2015-08-19 DIAGNOSIS — Z170 Estrogen receptor positive status [ER+]: Secondary | ICD-10-CM | POA: Diagnosis not present

## 2015-08-19 DIAGNOSIS — C50911 Malignant neoplasm of unspecified site of right female breast: Secondary | ICD-10-CM | POA: Diagnosis present

## 2015-08-21 ENCOUNTER — Other Ambulatory Visit: Payer: Self-pay | Admitting: *Deleted

## 2015-08-21 DIAGNOSIS — Z3200 Encounter for pregnancy test, result unknown: Secondary | ICD-10-CM

## 2015-08-22 ENCOUNTER — Other Ambulatory Visit
Admission: RE | Admit: 2015-08-22 | Discharge: 2015-08-22 | Disposition: A | Discharge status: Home / Self Care | Payer: Managed Care, Other (non HMO) | Source: Ambulatory Visit | Attending: General Surgery | Admitting: General Surgery

## 2015-08-22 ENCOUNTER — Ambulatory Visit: Payer: Managed Care, Other (non HMO)

## 2015-08-22 ENCOUNTER — Ambulatory Visit: Payer: Managed Care, Other (non HMO) | Admitting: Anesthesiology

## 2015-08-22 ENCOUNTER — Encounter: Payer: Self-pay | Admitting: *Deleted

## 2015-08-22 ENCOUNTER — Encounter
Admission: RE | Disposition: A | Discharge status: Home / Self Care | Payer: Self-pay | Source: Ambulatory Visit | Attending: General Surgery

## 2015-08-22 ENCOUNTER — Other Ambulatory Visit: Payer: Self-pay | Admitting: General Surgery

## 2015-08-22 ENCOUNTER — Encounter: Payer: Self-pay | Admitting: Anesthesiology

## 2015-08-22 ENCOUNTER — Ambulatory Visit
Admission: RE | Admit: 2015-08-22 | Discharge: 2015-08-22 | Disposition: A | Discharge status: Home / Self Care | Payer: Managed Care, Other (non HMO) | Source: Ambulatory Visit | Attending: General Surgery | Admitting: General Surgery

## 2015-08-22 DIAGNOSIS — N6091 Unspecified benign mammary dysplasia of right breast: Secondary | ICD-10-CM | POA: Insufficient documentation

## 2015-08-22 DIAGNOSIS — Z86010 Personal history of colonic polyps: Secondary | ICD-10-CM | POA: Insufficient documentation

## 2015-08-22 DIAGNOSIS — C50911 Malignant neoplasm of unspecified site of right female breast: Secondary | ICD-10-CM

## 2015-08-22 DIAGNOSIS — I10 Essential (primary) hypertension: Secondary | ICD-10-CM | POA: Insufficient documentation

## 2015-08-22 DIAGNOSIS — Z79899 Other long term (current) drug therapy: Secondary | ICD-10-CM | POA: Insufficient documentation

## 2015-08-22 DIAGNOSIS — F17210 Nicotine dependence, cigarettes, uncomplicated: Secondary | ICD-10-CM | POA: Insufficient documentation

## 2015-08-22 DIAGNOSIS — E079 Disorder of thyroid, unspecified: Secondary | ICD-10-CM | POA: Insufficient documentation

## 2015-08-22 DIAGNOSIS — C50411 Malignant neoplasm of upper-outer quadrant of right female breast: Secondary | ICD-10-CM

## 2015-08-22 DIAGNOSIS — D0501 Lobular carcinoma in situ of right breast: Secondary | ICD-10-CM | POA: Insufficient documentation

## 2015-08-22 DIAGNOSIS — Z823 Family history of stroke: Secondary | ICD-10-CM | POA: Insufficient documentation

## 2015-08-22 DIAGNOSIS — Z170 Estrogen receptor positive status [ER+]: Secondary | ICD-10-CM | POA: Insufficient documentation

## 2015-08-22 DIAGNOSIS — Z808 Family history of malignant neoplasm of other organs or systems: Secondary | ICD-10-CM | POA: Insufficient documentation

## 2015-08-22 HISTORY — PX: BREAST LUMPECTOMY WITH SENTINEL LYMPH NODE BIOPSY: SHX5597

## 2015-08-22 HISTORY — DX: Headache: R51

## 2015-08-22 HISTORY — PX: BREAST LUMPECTOMY: SHX2

## 2015-08-22 HISTORY — DX: Headache, unspecified: R51.9

## 2015-08-22 LAB — HCG, QUANTITATIVE, PREGNANCY: hCG, Beta Chain, Quant, S: 1 m[IU]/mL (ref <5)

## 2015-08-22 SURGERY — BREAST LUMPECTOMY WITH SENTINEL LYMPH NODE BX
Anesthesia: General | Laterality: Right | Wound class: Clean

## 2015-08-22 MED ORDER — FAMOTIDINE 20 MG PO TABS
20.0000 mg | ORAL_TABLET | Freq: Once | ORAL | Status: AC
  Administered 2015-08-22: 20 mg via ORAL

## 2015-08-22 MED ORDER — FENTANYL CITRATE (PF) 100 MCG/2ML IJ SOLN
INTRAMUSCULAR | Status: AC
  Administered 2015-08-22: 25 ug via INTRAVENOUS
  Filled 2015-08-22: qty 2

## 2015-08-22 MED ORDER — DEXMEDETOMIDINE HCL IN NACL 200 MCG/50ML IV SOLN
INTRAVENOUS | Status: DC | PRN
  Administered 2015-08-22: 14 ug via INTRAVENOUS

## 2015-08-22 MED ORDER — ACETAMINOPHEN 10 MG/ML IV SOLN
INTRAVENOUS | Status: DC | PRN
  Administered 2015-08-22: 1000 mg via INTRAVENOUS

## 2015-08-22 MED ORDER — FAMOTIDINE 20 MG PO TABS
ORAL_TABLET | ORAL | Status: AC
  Filled 2015-08-22: qty 1

## 2015-08-22 MED ORDER — FENTANYL CITRATE (PF) 100 MCG/2ML IJ SOLN
INTRAMUSCULAR | Status: DC | PRN
  Administered 2015-08-22: 50 ug via INTRAVENOUS

## 2015-08-22 MED ORDER — GLYCOPYRROLATE 0.2 MG/ML IJ SOLN: INTRAMUSCULAR | Status: DC | PRN

## 2015-08-22 MED ORDER — OXYCODONE-ACETAMINOPHEN 5-325 MG PO TABS: 1.0000 | ORAL_TABLET | ORAL | Status: DC | PRN

## 2015-08-22 MED ORDER — CEFAZOLIN SODIUM-DEXTROSE 2-3 GM-% IV SOLR
2.0000 g | INTRAVENOUS | Status: AC
  Administered 2015-08-22: 2 g via INTRAVENOUS

## 2015-08-22 MED ORDER — OXYCODONE-ACETAMINOPHEN 5-325 MG PO TABS
ORAL_TABLET | ORAL | Status: AC
  Administered 2015-08-22: 1 via ORAL
  Filled 2015-08-22: qty 1

## 2015-08-22 MED ORDER — METHYLENE BLUE 1 % INJ SOLN
INTRAMUSCULAR | Status: DC | PRN
  Administered 2015-08-22: 5 mL via SUBMUCOSAL

## 2015-08-22 MED ORDER — METHYLENE BLUE 1 % INJ SOLN
INTRAMUSCULAR | Status: AC
  Filled 2015-08-22: qty 10

## 2015-08-22 MED ORDER — LACTATED RINGERS IV SOLN
INTRAVENOUS | Status: DC
  Administered 2015-08-22 (×2): via INTRAVENOUS

## 2015-08-22 MED ORDER — BUPIVACAINE HCL (PF) 0.5 % IJ SOLN
INTRAMUSCULAR | Status: AC
  Filled 2015-08-22: qty 30

## 2015-08-22 MED ORDER — CHLORHEXIDINE GLUCONATE 4 % EX LIQD: 1.0000 "application " | Freq: Once | CUTANEOUS | Status: DC

## 2015-08-22 MED ORDER — SODIUM CHLORIDE 0.9 % IJ SOLN
INTRAMUSCULAR | Status: AC
  Filled 2015-08-22: qty 50

## 2015-08-22 MED ORDER — ACETAMINOPHEN 10 MG/ML IV SOLN
INTRAVENOUS | Status: AC
  Filled 2015-08-22: qty 100

## 2015-08-22 MED ORDER — PROPOFOL 10 MG/ML IV BOLUS
INTRAVENOUS | Status: DC | PRN
Start: 2015-08-22 — End: 2015-08-22
  Administered 2015-08-22: 50 mg via INTRAVENOUS
  Administered 2015-08-22: 140 mg via INTRAVENOUS

## 2015-08-22 MED ORDER — FENTANYL CITRATE (PF) 100 MCG/2ML IJ SOLN
25.0000 ug | INTRAMUSCULAR | Status: AC | PRN
  Administered 2015-08-22 (×6): 25 ug via INTRAVENOUS

## 2015-08-22 MED ORDER — TECHNETIUM TC 99M SULFUR COLLOID
0.9700 | Freq: Once | INTRAVENOUS | Status: DC | PRN
  Administered 2015-08-22: 0.97 via INTRAVENOUS
  Filled 2015-08-22: qty 0.97

## 2015-08-22 MED ORDER — OXYCODONE-ACETAMINOPHEN 5-325 MG PO TABS
1.0000 | ORAL_TABLET | ORAL | Status: DC | PRN
  Administered 2015-08-22: 1 via ORAL

## 2015-08-22 MED ORDER — MIDAZOLAM HCL 2 MG/2ML IJ SOLN
INTRAMUSCULAR | Status: DC | PRN
  Administered 2015-08-22: 2 mg via INTRAVENOUS

## 2015-08-22 MED ORDER — ONDANSETRON HCL 4 MG/2ML IJ SOLN: 4.0000 mg | Freq: Once | INTRAMUSCULAR | Status: DC | PRN

## 2015-08-22 MED ORDER — CEFAZOLIN SODIUM-DEXTROSE 2-3 GM-% IV SOLR
INTRAVENOUS | Status: AC
Start: 2015-08-22 — End: 2015-08-22
  Administered 2015-08-22: 2 g via INTRAVENOUS
  Filled 2015-08-22: qty 50

## 2015-08-22 MED ORDER — PHENYLEPHRINE HCL 10 MG/ML IJ SOLN
INTRAMUSCULAR | Status: DC | PRN
  Administered 2015-08-22: 100 ug via INTRAVENOUS
  Administered 2015-08-22: 200 ug via INTRAVENOUS

## 2015-08-22 MED ORDER — LIDOCAINE HCL (CARDIAC) 20 MG/ML IV SOLN
INTRAVENOUS | Status: DC | PRN
  Administered 2015-08-22: 60 mg via INTRAVENOUS

## 2015-08-22 SURGICAL SUPPLY — 51 items
APPLIER CLIP 11 MED OPEN (CLIP)
BLADE SURG 15 STRL SS SAFETY (BLADE) ×4 IMPLANT
BULB RESERV EVAC DRAIN JP 100C (MISCELLANEOUS) IMPLANT
CANISTER SUCT 1200ML W/VALVE (MISCELLANEOUS) ×2 IMPLANT
CHLORAPREP W/TINT 26ML (MISCELLANEOUS) ×2 IMPLANT
CLIP APPLIE 11 MED OPEN (CLIP) IMPLANT
CNTNR SPEC 2.5X3XGRAD LEK (MISCELLANEOUS) ×1
CONT SPEC 4OZ STER OR WHT (MISCELLANEOUS) ×1
CONTAINER SPEC 2.5X3XGRAD LEK (MISCELLANEOUS) ×1 IMPLANT
COVER PROBE FLX POLY STRL (MISCELLANEOUS) ×2 IMPLANT
DEVICE LOCALIZATION ULTRAWIRE (WIRE) IMPLANT
DRAIN CHANNEL JP 15F RND 16 (MISCELLANEOUS) IMPLANT
DRAPE LAPAROTOMY TRNSV 106X77 (MISCELLANEOUS) ×2 IMPLANT
DRSG TELFA 3X8 NADH (GAUZE/BANDAGES/DRESSINGS) ×2 IMPLANT
GLOVE BIO SURGEON STRL SZ7 (GLOVE) ×2 IMPLANT
GOWN STRL REUS W/ TWL LRG LVL3 (GOWN DISPOSABLE) ×3 IMPLANT
GOWN STRL REUS W/TWL LRG LVL3 (GOWN DISPOSABLE) ×3
HARMONIC SCALPEL FOCUS (MISCELLANEOUS) IMPLANT
KIT RM TURNOVER STRD PROC AR (KITS) ×2 IMPLANT
LABEL OR SOLS (LABEL) ×2 IMPLANT
LIQUID BAND (GAUZE/BANDAGES/DRESSINGS) ×2 IMPLANT
MARGIN MAP 10MM (MISCELLANEOUS) ×2 IMPLANT
NDL SAFETY 22GX1.5 (NEEDLE) ×2 IMPLANT
NEEDLE HYPO 25X1 1.5 SAFETY (NEEDLE) ×2 IMPLANT
NS IRRIG 1000ML POUR BTL (IV SOLUTION) ×2 IMPLANT
PACK BASIN MINOR ARMC (MISCELLANEOUS) ×2 IMPLANT
PAD GROUND ADULT SPLIT (MISCELLANEOUS) ×2 IMPLANT
SLEVE PROBE SENORX GAMMA FIND (MISCELLANEOUS) ×2 IMPLANT
SPONGE LAP 18X18 5 PK (GAUZE/BANDAGES/DRESSINGS) IMPLANT
STRIP CLOSURE SKIN 1/2X4 (GAUZE/BANDAGES/DRESSINGS) IMPLANT
SUT ETH BLK MONO 3 0 FS 1 12/B (SUTURE) ×2 IMPLANT
SUT MNCRL AB 3-0 PS2 27 (SUTURE) ×2 IMPLANT
SUT SILK 2 0 (SUTURE)
SUT SILK 2-0 18XBRD TIE 12 (SUTURE) IMPLANT
SUT SILK 3 0 (SUTURE)
SUT SILK 3-0 (SUTURE) IMPLANT
SUT SILK 3-0 18XBRD TIE 12 (SUTURE) IMPLANT
SUT SILK 4 0 (SUTURE)
SUT SILK 4-0 18XBRD TIE 12 (SUTURE) IMPLANT
SUT VIC AB 2-0 BRD 54 (SUTURE) ×2 IMPLANT
SUT VIC AB 2-0 CT1 27 (SUTURE)
SUT VIC AB 2-0 CT1 TAPERPNT 27 (SUTURE) IMPLANT
SUT VIC AB 2-0 CT2 27 (SUTURE) ×4 IMPLANT
SUT VIC AB 4-0 FS2 27 (SUTURE) ×2 IMPLANT
SUT VICRYL+ 3-0 144IN (SUTURE) IMPLANT
SWABSTK COMLB BENZOIN TINCTURE (MISCELLANEOUS) IMPLANT
SYR BULB IRRIG 60ML STRL (SYRINGE) IMPLANT
SYRINGE 10CC LL (SYRINGE) ×2 IMPLANT
TAPE TRANSPORE STRL 2 31045 (GAUZE/BANDAGES/DRESSINGS) ×2 IMPLANT
ULTRAWIRE LOCALIZATION DEVICE (WIRE)
WATER STERILE IRR 1000ML POUR (IV SOLUTION) ×2 IMPLANT

## 2015-08-22 NOTE — Anesthesia Preprocedure Evaluation (Addendum)
Anesthesia Evaluation  Patient identified by MRN, date of birth, ID band Patient awake    Reviewed: Allergy & Precautions, NPO status , Patient's Chart, lab work & pertinent test results, reviewed documented beta blocker date and time   Airway Mallampati: II  TM Distance: >3 FB Neck ROM: Full    Dental no notable dental hx. (+) Chipped   Pulmonary Current Smoker,    Pulmonary exam normal breath sounds clear to auscultation       Cardiovascular hypertension, Pt. on medications and Pt. on home beta blockers      Neuro/Psych  Headaches, negative psych ROS   GI/Hepatic negative GI ROS, Neg liver ROS,   Endo/Other  negative endocrine ROS  Renal/GU negative Renal ROS  negative genitourinary   Musculoskeletal  (+) Arthritis , Osteoarthritis,    Abdominal Normal abdominal exam  (+)   Peds negative pediatric ROS (+)  Hematology  (+) anemia ,   Anesthesia Other Findings   Reproductive/Obstetrics                           Anesthesia Physical Anesthesia Plan  ASA: II  Anesthesia Plan: General   Post-op Pain Management:    Induction: Intravenous  Airway Management Planned: LMA  Additional Equipment:   Intra-op Plan:   Post-operative Plan: Extubation in OR  Informed Consent: I have reviewed the patients History and Physical, chart, labs and discussed the procedure including the risks, benefits and alternatives for the proposed anesthesia with the patient or authorized representative who has indicated his/her understanding and acceptance.   Dental advisory given  Plan Discussed with: CRNA and Surgeon  Anesthesia Plan Comments:         Anesthesia Quick Evaluation

## 2015-08-22 NOTE — Discharge Instructions (Signed)
AMBULATORY SURGERY  DISCHARGE INSTRUCTIONS   1) The drugs that you were given will stay in your system until tomorrow so for the next 24 hours you should not:  A) Drive an automobile B) Make any legal decisions C) Drink any alcoholic beverage   2) You may resume regular meals tomorrow.  Today it is better to start with liquids and gradually work up to solid foods.  You may eat anything you prefer, but it is better to start with liquids, then soup and crackers, and gradually work up to solid foods.   3) Please notify your doctor immediately if you have any unusual bleeding, trouble breathing, redness and pain at the surgery site, drainage, fever, or pain not relieved by medication.    4) Additional Instructions:     Breast Biopsy A breast biopsy is a test during which a sample of tissue is taken from your breast. The breast tissue is looked at under a microscope for cancer cells.  BEFORE THE PROCEDURE  Make plans to have someone drive you home after the test.  Do not smoke for 2 weeks before the test. Stop smoking, if you smoke.  Do not drink alcohol for 24 hours before the test.  Wear a good support bra to the test.  Your doctor may do a procedure to place a wire or a seed that gives off radiation in the breast lump. The wire or seed will help your doctor see the lump when doing the biopsy. PROCEDURE  You may be given one of the following:  A medicine to numb the breast area (local anesthetic).  A medicine to make you fall asleep (general anesthetic). There are different types of breast biopsies. They include:  Fine-needle aspiration.  A needle is put into the breast lump.  The needle takes out fluid and cells from the lump.  Ultrasound imaging may be used to help find the lump and to put the needle in the right spot.  Core-needle biopsy.  A needle is put into the breast lump.  The needle is put in your breast 3-6 times.  The needle removes breast  tissue.  An ultrasound image or X-ray is often used to find the right spot to put in the needle.  Stereotactic biopsy.  X-rays and a computer are used to study X-ray pictures of the breast lump.  The computer finds where the needle needs to be put into the breast.  Tissue samples are taken out.  Vacuum-assisted biopsy.  A small cut (incision) is made in your breast.  A biopsy device is put through the cut and into the breast tissue.  The biopsy device draws abnormal breast tissue into the biopsy device.  A large tissue sample is often removed.  No stitches are needed.  Ultrasound-guided core-needle biopsy.  Ultrasound imaging helps guide the needle into the area of the breast that is not normal.  A cut is made in the breast. The needle is put into the breast lump.  Tissue samples are taken out.  Open biopsy.  A large cut is made in the breast.  Your doctor will try to remove the whole breast lump or as much as possible. All tissue, fluid, or cell samples are looked at under a microscope.  AFTER THE PROCEDURE  You will be taken to an area to recover. You will be able to go home once you are doing well and are without problems.  You may have bruising on your breast. This is normal.  A pressure bandage (dressing) may be put on your breast for 24-48 hours. This type of bandage is wrapped tightly around your chest. It helps stop fluid from building up underneath tissues.   This information is not intended to replace advice given to you by your health care provider. Make sure you discuss any questions you have with your health care provider.   Document Released: 01/10/2012 Document Revised: 07/09/2015 Document Reviewed: 01/10/2012 Elsevier Interactive Patient Education 2016 [employer].     Please contact your physician with any problems or Same Day Surgery at [phone], Monday through Friday 6 am to 4 pm, or [place] [facility] number at  [phone].Lumpectomy, Care After Refer to this sheet in the next few weeks. These instructions provide you with information on caring for yourself after your procedure. Your health care provider may also give you more specific instructions. Your treatment has been planned according to current medical practices, but problems sometimes occur. Call your health care provider if you have any problems or questions after your procedure. WHAT TO EXPECT AFTER THE PROCEDURE After your procedure, it is typical to have soreness, bruising, and swelling of your breast. This is normal. You will be given medicines to control your pain. HOME CARE INSTRUCTIONS  Take medicines only as directed by your health care provider.  Resume a normal diet as directed by your health care provider.  Resume normal activity as directed by your health care provider. Avoid strenuous activity that affects the arm on the side that the surgical cut (incision) was made. Avoid playing tennis, swimming, lifting heavy objects (those that weigh more than 10 pounds [4.5 kg]), and pulling for 2 weeks.  Change bandages (dressings) as directed by your health care provider.  Consider wearing a bra to bed if you feel discomfort at the breast. Wearing a bra also helps keep dressings on.  Keep all follow-up visits as directed by your health care provider. This is important.  Call for the results of your procedure as instructed by your surgeon. It is your responsibility to get your test results. Do not assume everything is fine if you have not heard from your health care provider.  Keep the incision site dry.  If the incision site is tender, applying an ice pack may relieve some discomfort. To do this:  Put ice in a plastic bag.  Place a towel between your skin and the bag.  Leave the ice on for 15-20 minutes, 3-4 times a day. SEEK MEDICAL CARE IF:   You have increased bleeding from the incision site.  You notice redness, swelling,  or increasing pain in the incision.  You have pus coming from the incision site.  You have a fever.  You notice a foul smell coming from the incision or dressing. SEEK IMMEDIATE MEDICAL CARE IF:   You develop a rash.  You have shortness of breath.  You have chest pain.   This information is not intended to replace advice given to you by your health care provider. Make sure you discuss any questions you have with your health care provider.   Document Released: 11/03/2006 Document Revised: 11/08/2014 Document Reviewed: 05/17/2013 Elsevier Interactive Patient Education [employer].

## 2015-08-22 NOTE — Progress Notes (Signed)
  Oncology Nurse Navigator Documentation    Navigator Encounter Type: Other (visit during surgery) (08/22/15 1100) Patient Visit Type: Follow-up (08/22/15 1100) Treatment Phase: Other (surgery) (08/22/15 1100) Barriers/Navigation Needs: No barriers at this time (08/22/15 1100)   Interventions: None required (08/22/15 1100)    Met patient today prior to surgery.  Offered support.  She is to call if she has any questions or needs.        Time Spent with Patient: 15 (08/22/15 1100)

## 2015-08-22 NOTE — Interval H&P Note (Signed)
History and Physical Interval Note:  08/22/2015 8:56 AM  Michelle Walker  has presented today for surgery, with the diagnosis of RIGHT BREAST CANCER  The various methods of treatment have been discussed with the patient and family. After consideration of risks, benefits and other options for treatment, the patient has consented to  Procedure(s): BREAST LUMPECTOMY WITH SENTINEL LYMPH NODE BX (Right) AXILLARY LYMPH NODE DISSECTION (Right) as a surgical intervention .  The patient's history has been reviewed, patient examined, no change in status, stable for surgery.  I have reviewed the patient's chart and labs.  Questions were answered to the patient's satisfaction.     Ashon Rosenberg G

## 2015-08-22 NOTE — H&P (View-Only) (Signed)
Patient ID: Michelle Walker, female   DOB: [birth year], 54 y.o.   MRN: [id number]  Chief Complaint  Patient presents with  . Breast Cancer    HPI Michelle Walker is a 54 y.o. female here today for an evaluation for right breast cancer, she had a mammogram 06/24/15 and a  right breast stereotactic biopsy done 07/24/15. She states she did not feel any lumps or tenderness prior to getting her yearly checkup.  She states that sometimes she has to have two mammograms a year. 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  HPI  Past Medical History  Diagnosis Date  . Arthritis   . Anemia   . Thyroid disease   . Hypertension   . Cancer [facility])     Past Surgical History  Procedure Laterality Date  . Breast biopsy Right 07/17/2015    path pending  . Colonoscopy w/ polypectomy  2014  . Dilation and curettage of uterus      20 years ago    Family History  Problem Relation Age of Onset  . Stroke Mother   . Cancer Sister     sarcoma on arm; chemo    Social History Social History  Substance Use Topics  . Smoking status: Current Every Day Smoker -- 0.50 packs/day for 35 years    Types: Cigarettes  . Smokeless tobacco: Never Used  . Alcohol Use: 1.2 oz/week    2 Standard drinks or equivalent per week    No Known Allergies  Current Outpatient Prescriptions  Medication Sig Dispense Refill  . amLODipine (NORVASC) 10 MG tablet Take 10 mg by mouth at bedtime.   0  . losartan (COZAAR) 100 MG tablet Take 100 mg by mouth at bedtime.   0  . metoprolol succinate (TOPROL-XL) 25 MG 24 hr tablet Take 25 mg by mouth at bedtime.   0  . Vitamin D, Ergocalciferol, (DRISDOL) 50000 UNITS CAPS capsule Take 50,000 Units by mouth every 7 (seven) days. On Thursday  0  . allopurinol (ZYLOPRIM) 100 MG tablet Take 100 mg by mouth at bedtime.    [name] HYDROcodone-acetaminophen (NORCO/VICODIN) 5-325 MG tablet Take 1 tablet by mouth every 4 (four) hours as needed for moderate pain. 20 tablet 0    No current facility-administered medications for this visit.    Review of Systems Review of Systems  Constitutional: Negative.   Respiratory: Negative.   Cardiovascular: Negative.     Blood pressure 140/70, pulse 82, resp. rate 12, height 5' 5" (1.651 m), weight 190 lb (86.183 kg).  Physical Exam Physical Exam  Constitutional: She is oriented to person, place, and time. She appears well-developed and well-nourished.  Eyes: Conjunctivae are normal. No scleral icterus.  Neck: Neck supple.  Cardiovascular: Normal rate, regular rhythm and normal heart sounds.   Pulmonary/Chest: Effort normal and breath sounds normal. Right breast exhibits no inverted nipple, no mass, no nipple discharge, no skin change and no tenderness. Left breast exhibits no inverted nipple, no mass, no nipple discharge, no skin change and no tenderness.  Abdominal: Soft. Bowel sounds are normal.  Lymphadenopathy:    She has no cervical adenopathy.    She has no axillary adenopathy.  Neurological: She is alert and oriented to person, place, and time.  Skin: Skin is warm and dry.    Data Reviewed Mammogram and pathology report invasive lobular cancer. Imaging size is 7-41m. T1b,N0 Assessment    Right breast invasive lobular breast cancer, ER+/PR+. Her2 results  being faxed today.    Plan   Traeatment options discussed. Lumpectomy and sentinel node biopsy of right breast recommended, followed by radiation and antihormonal therapy Additional treatment dependent on Her2 results.       PCP: Casilda Carls, MD   Junie Panning G 08/05/2015, 1:11 PM

## 2015-08-22 NOTE — Transfer of Care (Signed)
Immediate Anesthesia Transfer of Care Note  Patient: Michelle Walker  Procedure(s) Performed: Procedure(s): BREAST LUMPECTOMY WITH SENTINEL LYMPH NODE BX (Right)  Patient Location: PACU  Anesthesia Type:General  Level of Consciousness: sedated  Airway & Oxygen Therapy: Patient Spontanous Breathing and Patient connected to face mask oxygen  Post-op Assessment: Report given to RN and Post -op Vital signs reviewed and stable  Post vital signs: Reviewed and stable  Last Vitals: There were no vitals filed for this visit.  Complications: No apparent anesthesia complications

## 2015-08-22 NOTE — Anesthesia Procedure Notes (Signed)
Procedure Name: LMA Insertion Date/Time: 08/22/2015 10:36 AM Performed by: Justus Memory Pre-anesthesia Checklist: Patient identified, Emergency Drugs available, Suction available and Patient being monitored Patient Re-evaluated:Patient Re-evaluated prior to inductionOxygen Delivery Method: Circle system utilized Preoxygenation: Pre-oxygenation with 100% oxygen Intubation Type: IV induction Ventilation: Mask ventilation without difficulty LMA: LMA inserted LMA Size: 4.5 Dental Injury: Teeth and Oropharynx as per pre-operative assessment

## 2015-08-22 NOTE — Op Note (Signed)
Preop diagnosis: Carcinoma right breast  Post op diagnosis: Same  Operation: Right breast lumpectomy and sentinel node biopsy  Surgeon: S.G.Krystie Leiter  Assistant:     Anesthesia: Gen.  Complications: None  EBL: Less than 25 mL  Drains: None  Description: Patient underwent nuclear contrast injection preoperatively along with wire localization of the previously biopsied site along with an gentle adjacent the area of microcalcification 2 wires were used for this. Patient was brought to the operating room put to sleep in the supine position the operating table. The right breast and axilla were prepped and draped as sterile field. 10 mL of 50% diluted methylene blue was injected in the subareolar tissue for additional sentinel node identification. Timeout was performed the Gamma finder showed intense activity in a focal area in the inferior medial portion the axilla. Skin incision was made overlying this in a transverse orientation. Bleeding was controlled cautery and dissection was carried out until the sentinel node containing intense activity as long and along with the bluish discoloration was identified. This was removed and sent off as sentinel node #1. An additional node just superior medial to this was identified which was bluish in color but had only limited activity on the Gamma finder. This one was removed and sent off as sentinel node #2. Touch implants in both these did not reveal any evidence of macro metastases. Lumpectomy was then performed the area that was previously biopsied was located slightly superior to the other area of microcalcification which is 4 cm from that. A curvilinear incision was made and skin and subcutaneous tissue with an elevated on both sides both wires were free from the skin. Lumpectomy was accomplished by going around both wires not allowing for an adequate amount of tissue on all sides excision was completed and the specimen was tagged for margins. Specimen was  sent for mammogram showing presence of both the calcifications of concern and also the clip where the previous biopsy had been completed. The wound was irrigated and closed both incision closed with the 2 layers of 2-0 Vicryl in the deep tissue and the skin with subcuticular 3-0 Monocryl.Liqui ban was applied. Patient subsequently returned to PACU in stable condition.

## 2015-08-25 LAB — SURGICAL PATHOLOGY

## 2015-08-28 ENCOUNTER — Encounter: Payer: Self-pay | Admitting: General Surgery

## 2015-08-28 ENCOUNTER — Ambulatory Visit (INDEPENDENT_AMBULATORY_CARE_PROVIDER_SITE_OTHER): Payer: Managed Care, Other (non HMO) | Admitting: General Surgery

## 2015-08-28 VITALS — BP 152/82 | HR 86 | Resp 14 | Ht 65.0 in | Wt 192.0 lb

## 2015-08-28 DIAGNOSIS — C50411 Malignant neoplasm of upper-outer quadrant of right female breast: Secondary | ICD-10-CM

## 2015-08-28 NOTE — Patient Instructions (Signed)
Follow up with Dr. Donella Stade to discuss radiation therapy. Call the office if any questions or concerns in meantime.

## 2015-08-28 NOTE — Progress Notes (Signed)
Here today for postoperative visit, right breast lumpectomy and SN biopsy on 08-22-15. She states she is doing well, the area is a little "sore". I have reviewed the history of present illness with the patient.  Pathology report showed LCIS, ADH, no residual invasive CA- complete surgical resection was achieved during procedure. ER PR positive.Her 2 negative. SN(2) neg  Surgical site from lumpectomy healing well with no signs of infection. Advised patient to continue wearing compression bra over site to decrease risk of seroma formation.   Will set up appointment to discuss potential radiation and further treatment with Dr. Baruch Gouty. Will discuss antihormonal therapy after radiation is complete.         PCP:  Casilda Carls

## 2015-08-29 ENCOUNTER — Telehealth: Payer: Self-pay

## 2015-08-29 NOTE — Telephone Encounter (Signed)
Message left for patient to inform her that her appointment with Dr Baruch Gouty at the [facility] is scheduled for Wednesday November 2nd at 9:00 am. She may call here with any questions.

## 2015-09-01 NOTE — Anesthesia Postprocedure Evaluation (Signed)
  Anesthesia Post-op Note  Patient: Michelle Walker  Procedure(s) Performed: Procedure(s): BREAST LUMPECTOMY WITH SENTINEL LYMPH NODE BX (Right)  Anesthesia type:General  Patient location: PACU  Post pain: Pain level controlled  Post assessment: Post-op Vital signs reviewed, Patient's Cardiovascular Status Stable, Respiratory Function Stable, Patent Airway and No signs of Nausea or vomiting  Post vital signs: Reviewed and stable  Last Vitals:  Filed Vitals:   08/22/15 1359  BP: 124/83  Pulse: 71  Temp:   Resp: 16    Level of consciousness: awake, alert  and patient cooperative  Complications: No apparent anesthesia complications

## 2015-09-03 ENCOUNTER — Encounter: Payer: Self-pay | Admitting: Radiation Oncology

## 2015-09-03 ENCOUNTER — Encounter (INDEPENDENT_AMBULATORY_CARE_PROVIDER_SITE_OTHER): Payer: Self-pay

## 2015-09-03 ENCOUNTER — Ambulatory Visit
Admission: RE | Admit: 2015-09-03 | Discharge: 2015-09-03 | Disposition: A | Discharge status: Home / Self Care | Payer: Managed Care, Other (non HMO) | Source: Ambulatory Visit | Attending: Radiation Oncology | Admitting: Radiation Oncology

## 2015-09-03 VITALS — BP 142/90 | HR 76 | Temp 98.6°F | Wt 194.6 lb

## 2015-09-03 DIAGNOSIS — F17210 Nicotine dependence, cigarettes, uncomplicated: Secondary | ICD-10-CM | POA: Insufficient documentation

## 2015-09-03 DIAGNOSIS — C50411 Malignant neoplasm of upper-outer quadrant of right female breast: Secondary | ICD-10-CM | POA: Insufficient documentation

## 2015-09-03 DIAGNOSIS — Z510 Encounter for antineoplastic radiation therapy: Secondary | ICD-10-CM | POA: Insufficient documentation

## 2015-09-03 DIAGNOSIS — C50911 Malignant neoplasm of unspecified site of right female breast: Secondary | ICD-10-CM

## 2015-09-03 DIAGNOSIS — I10 Essential (primary) hypertension: Secondary | ICD-10-CM | POA: Insufficient documentation

## 2015-09-03 DIAGNOSIS — Z170 Estrogen receptor positive status [ER+]: Secondary | ICD-10-CM | POA: Insufficient documentation

## 2015-09-03 DIAGNOSIS — Z79899 Other long term (current) drug therapy: Secondary | ICD-10-CM | POA: Insufficient documentation

## 2015-09-03 NOTE — Consult Note (Signed)
Except an outstanding is perfect of Radiation Oncology NEW PATIENT EVALUATION  Name: Michelle Walker  MRN: [id number]  Date:   09/03/2015     DOB: Feb 08, 1961   This 54 y.o. female patient presents to the clinic for initial evaluation of stage IA (T1 1 N0 M0) invasive lobular carcinoma of the right breast status post wide local excision and sentinel node biopsy tumor is ER/PR positive.  REFERRING PHYSICIAN: Casilda Carls, MD  CHIEF COMPLAINT:  Chief Complaint  Patient presents with  . Breast Cancer    Initial evaluation    DIAGNOSIS: The encounter diagnosis was Malignant neoplasm of right female breast, unspecified site of breast ([place]).   PREVIOUS INVESTIGATIONS:  Surgical pathology reports reviewed Clinical notes reviewed Mammogram ultrasound reviewed  HPI: Patient is a 54 year old female who presented with an abnormal screening mammogram showing calcifications which were suspicious in the right breast. Follow-up films show suspicious calcifications the posterior upper outer quadrant of the right breast. She underwent a needle localization showing a 1.5 mm region of invasive lobular carcinoma. She went on to have a wide local excision showing no residual invasive carcinoma although lobular carcinoma in situ was present. 2 sentinel lymph nodes were negative for metastatic disease. She has done well postoperatively. She is referred today for consideration of adjuvant radiation therapy. She specifically denies breast tenderness cough or bone pain.  PLANNED TREATMENT REGIMEN: Whole breast radiation  PAST MEDICAL HISTORY:  has a past medical history of Arthritis; Anemia; Thyroid disease; Hypertension; Cancer ([place]); and Headache.    PAST SURGICAL HISTORY:  Past Surgical History  Procedure Laterality Date  . Colonoscopy w/ polypectomy  2014  . Dilation and curettage of uterus      20 years ago  . Anterior cruciate ligament repair Right   . Novasure ablation    . Breast lumpectomy  with sentinel lymph node biopsy Right 08/22/2015    Procedure: BREAST LUMPECTOMY WITH SENTINEL LYMPH NODE BX;  Surgeon: Christene Lye, MD;  Location: [facility] ORS;  Service: General;  Laterality: Right;  . Breast biopsy Right 07/17/2015    INVASIVE LOBULAR CARCINOMA, CLASSIC TYPE (1.5 MM), ARISING IN A     FAMILY HISTORY: family history includes Cancer in her sister; Stroke in her mother.  SOCIAL HISTORY:  reports that she has been smoking Cigarettes.  She has a 17.5 pack-year smoking history. She has never used smokeless tobacco. She reports that she drinks about 1.2 oz of alcohol per week. She reports that she does not use illicit drugs.  ALLERGIES: Tape  MEDICATIONS:  Current Outpatient Prescriptions  Medication Sig Dispense Refill  . allopurinol (ZYLOPRIM) 100 MG tablet Take 100 mg by mouth at bedtime.    [name] amLODipine (NORVASC) 10 MG tablet Take 10 mg by mouth at bedtime.   0  . losartan (COZAAR) 100 MG tablet Take 100 mg by mouth at bedtime.   0  . metoprolol succinate (TOPROL-XL) 25 MG 24 hr tablet Take 25 mg by mouth at bedtime.   0  . Vitamin D, Ergocalciferol, (DRISDOL) 50000 UNITS CAPS capsule Take 50,000 Units by mouth every 7 (seven) days. On Thursday  0   No current facility-administered medications for this encounter.    ECOG PERFORMANCE STATUS:  0 - Asymptomatic  REVIEW OF SYSTEMS:  Patient denies any weight loss, fatigue, weakness, fever, chills or night sweats. Patient denies any loss of vision, blurred vision. Patient denies any ringing  of the ears or hearing loss. No irregular heartbeat. Patient denies  heart murmur or history of fainting. Patient denies any chest pain or pain radiating to her upper extremities. Patient denies any shortness of breath, difficulty breathing at night, cough or hemoptysis. Patient denies any swelling in the lower legs. Patient denies any nausea vomiting, vomiting of blood, or coffee ground material in the vomitus. Patient denies any  stomach pain. Patient states has had normal bowel movements no significant constipation or diarrhea. Patient denies any dysuria, hematuria or significant nocturia. Patient denies any problems walking, swelling in the joints or loss of balance. Patient denies any skin changes, loss of hair or loss of weight. Patient denies any excessive worrying or anxiety or significant depression. Patient denies any problems with insomnia. Patient denies excessive thirst, polyuria, polydipsia. Patient denies any swollen glands, patient denies easy bruising or easy bleeding. Patient denies any recent infections, allergies or URI. Patient "s visual fields have not changed significantly in recent time.    PHYSICAL EXAM: BP 142/90 mmHg  Pulse 76  Temp(Src) 98.6 F (37 C)  Wt 194 lb 8.9 oz (88.25 kg)  LMP 07/19/2015 Patient status post wide local excision in approximately the 9:00 position of the right breast with some scarring in lumpectomy cavity noted. No other dominant mass or nodularity is noted in either breast. Breasts are large and pendulous. No axillary or supraclavicular adenopathy is appreciated. Well-developed well-nourished patient in NAD. HEENT reveals PERLA, EOMI, discs not visualized.  Oral cavity is clear. No oral mucosal lesions are identified. Neck is clear without evidence of cervical or supraclavicular adenopathy. Lungs are clear to A&P. Cardiac examination is essentially unremarkable with regular rate and rhythm without murmur rub or thrill. Abdomen is benign with no organomegaly or masses noted. Motor sensory and DTR levels are equal and symmetric in the upper and lower extremities. Cranial nerves II through XII are grossly intact. Proprioception is intact. No peripheral adenopathy or edema is identified. No motor or sensory levels are noted. Crude visual fields are within normal range.  LABORATORY DATA: Pathology reports reviewed    RADIOLOGY RESULTS: Mammograms and ultrasound  reviewed   IMPRESSION: Stage Ia invasive lobular carcinoma the right breast status post wide local excision and sentinel node biopsy ER/PR positive in 54 year old female  PLAN: I have done a literature review of treating lobular carcinoma in situ with accelerated partial breast radiation and it is not a strong contraindication but according to some studies including a [place] series up to 40% breast recurrence risk after accelerated partial breast irradiation for lobular carcinoma in situ. I believe based in her young age and LCIS patient would benefit from probable tamoxifen therapy plus to whole breast radiation. I would recommend 5000 cGy over 5 weeks boosting her scar another 1600 cGy using electrons. Risks and benefits of treatment including skin reaction which may be accentuated secondary to her large breast size, fatigue, alteration of blood counts, and inclusion of some superficial lung all were discussed in detail with the patient. She seems to comprehend my treatment plan well. I have set up and ordered CT simulation on her this week. I've discussed the case personally with surgeon.  I would like to take this opportunity for allowing me to participate in the care of your patient.Armstead Peaks., MD

## 2015-09-04 ENCOUNTER — Ambulatory Visit
Admission: RE | Admit: 2015-09-04 | Discharge: 2015-09-04 | Disposition: A | Discharge status: Home / Self Care | Payer: Managed Care, Other (non HMO) | Source: Ambulatory Visit | Attending: Radiation Oncology | Admitting: Radiation Oncology

## 2015-09-04 DIAGNOSIS — F17210 Nicotine dependence, cigarettes, uncomplicated: Secondary | ICD-10-CM | POA: Diagnosis not present

## 2015-09-04 DIAGNOSIS — Z510 Encounter for antineoplastic radiation therapy: Secondary | ICD-10-CM | POA: Diagnosis present

## 2015-09-04 DIAGNOSIS — Z79899 Other long term (current) drug therapy: Secondary | ICD-10-CM | POA: Diagnosis not present

## 2015-09-04 DIAGNOSIS — Z170 Estrogen receptor positive status [ER+]: Secondary | ICD-10-CM | POA: Diagnosis not present

## 2015-09-04 DIAGNOSIS — I10 Essential (primary) hypertension: Secondary | ICD-10-CM | POA: Diagnosis not present

## 2015-09-04 DIAGNOSIS — C50411 Malignant neoplasm of upper-outer quadrant of right female breast: Secondary | ICD-10-CM | POA: Diagnosis not present

## 2015-09-05 ENCOUNTER — Other Ambulatory Visit: Payer: Self-pay | Admitting: *Deleted

## 2015-09-05 DIAGNOSIS — C50411 Malignant neoplasm of upper-outer quadrant of right female breast: Secondary | ICD-10-CM

## 2015-09-10 DIAGNOSIS — Z510 Encounter for antineoplastic radiation therapy: Secondary | ICD-10-CM | POA: Diagnosis not present

## 2015-09-11 ENCOUNTER — Ambulatory Visit
Admission: RE | Admit: 2015-09-11 | Discharge: 2015-09-11 | Disposition: A | Discharge status: Home / Self Care | Payer: Managed Care, Other (non HMO) | Source: Ambulatory Visit | Attending: Radiation Oncology | Admitting: Radiation Oncology

## 2015-09-11 DIAGNOSIS — Z510 Encounter for antineoplastic radiation therapy: Secondary | ICD-10-CM | POA: Diagnosis not present

## 2015-09-15 ENCOUNTER — Ambulatory Visit: Payer: Managed Care, Other (non HMO)

## 2015-09-15 ENCOUNTER — Ambulatory Visit
Admission: RE | Admit: 2015-09-15 | Discharge: 2015-09-15 | Disposition: A | Discharge status: Home / Self Care | Payer: Managed Care, Other (non HMO) | Source: Ambulatory Visit | Attending: Radiation Oncology | Admitting: Radiation Oncology

## 2015-09-15 DIAGNOSIS — Z510 Encounter for antineoplastic radiation therapy: Secondary | ICD-10-CM | POA: Diagnosis not present

## 2015-09-16 ENCOUNTER — Ambulatory Visit
Admission: RE | Admit: 2015-09-16 | Discharge: 2015-09-16 | Disposition: A | Discharge status: Home / Self Care | Payer: Managed Care, Other (non HMO) | Source: Ambulatory Visit | Attending: Radiation Oncology | Admitting: Radiation Oncology

## 2015-09-16 ENCOUNTER — Ambulatory Visit: Payer: Managed Care, Other (non HMO)

## 2015-09-16 DIAGNOSIS — Z510 Encounter for antineoplastic radiation therapy: Secondary | ICD-10-CM | POA: Diagnosis not present

## 2015-09-17 ENCOUNTER — Ambulatory Visit
Admission: RE | Admit: 2015-09-17 | Discharge: 2015-09-17 | Disposition: A | Discharge status: Home / Self Care | Payer: Managed Care, Other (non HMO) | Source: Ambulatory Visit | Attending: Radiation Oncology | Admitting: Radiation Oncology

## 2015-09-17 ENCOUNTER — Ambulatory Visit: Payer: Managed Care, Other (non HMO)

## 2015-09-17 DIAGNOSIS — Z510 Encounter for antineoplastic radiation therapy: Secondary | ICD-10-CM | POA: Diagnosis not present

## 2015-09-18 ENCOUNTER — Ambulatory Visit: Payer: Managed Care, Other (non HMO)

## 2015-09-18 ENCOUNTER — Ambulatory Visit
Admission: RE | Admit: 2015-09-18 | Discharge: 2015-09-18 | Disposition: A | Discharge status: Home / Self Care | Payer: Managed Care, Other (non HMO) | Source: Ambulatory Visit | Attending: Radiation Oncology | Admitting: Radiation Oncology

## 2015-09-18 DIAGNOSIS — Z510 Encounter for antineoplastic radiation therapy: Secondary | ICD-10-CM | POA: Diagnosis not present

## 2015-09-19 ENCOUNTER — Ambulatory Visit: Payer: Managed Care, Other (non HMO)

## 2015-09-19 ENCOUNTER — Ambulatory Visit
Admission: RE | Admit: 2015-09-19 | Discharge: 2015-09-19 | Disposition: A | Discharge status: Home / Self Care | Payer: Managed Care, Other (non HMO) | Source: Ambulatory Visit | Attending: Radiation Oncology | Admitting: Radiation Oncology

## 2015-09-19 DIAGNOSIS — Z510 Encounter for antineoplastic radiation therapy: Secondary | ICD-10-CM | POA: Diagnosis not present

## 2015-09-22 ENCOUNTER — Ambulatory Visit
Admission: RE | Admit: 2015-09-22 | Discharge: 2015-09-22 | Disposition: A | Discharge status: Home / Self Care | Payer: Managed Care, Other (non HMO) | Source: Ambulatory Visit | Attending: Radiation Oncology | Admitting: Radiation Oncology

## 2015-09-22 ENCOUNTER — Ambulatory Visit: Payer: Managed Care, Other (non HMO)

## 2015-09-22 DIAGNOSIS — Z510 Encounter for antineoplastic radiation therapy: Secondary | ICD-10-CM | POA: Diagnosis not present

## 2015-09-23 ENCOUNTER — Inpatient Hospital Stay: Payer: Managed Care, Other (non HMO) | Attending: Radiation Oncology

## 2015-09-23 ENCOUNTER — Ambulatory Visit: Payer: Managed Care, Other (non HMO)

## 2015-09-23 ENCOUNTER — Encounter: Payer: Self-pay | Admitting: General Surgery

## 2015-09-23 ENCOUNTER — Ambulatory Visit: Payer: Managed Care, Other (non HMO) | Admitting: General Surgery

## 2015-09-23 ENCOUNTER — Ambulatory Visit (INDEPENDENT_AMBULATORY_CARE_PROVIDER_SITE_OTHER): Payer: Managed Care, Other (non HMO) | Admitting: General Surgery

## 2015-09-23 ENCOUNTER — Ambulatory Visit
Admission: RE | Admit: 2015-09-23 | Discharge: 2015-09-23 | Disposition: A | Discharge status: Home / Self Care | Payer: Managed Care, Other (non HMO) | Source: Ambulatory Visit | Attending: Radiation Oncology | Admitting: Radiation Oncology

## 2015-09-23 VITALS — BP 130/78 | HR 76 | Resp 12 | Ht 65.0 in | Wt 190.0 lb

## 2015-09-23 DIAGNOSIS — C50411 Malignant neoplasm of upper-outer quadrant of right female breast: Secondary | ICD-10-CM | POA: Insufficient documentation

## 2015-09-23 DIAGNOSIS — Z510 Encounter for antineoplastic radiation therapy: Secondary | ICD-10-CM | POA: Diagnosis not present

## 2015-09-23 DIAGNOSIS — I808 Phlebitis and thrombophlebitis of other sites: Secondary | ICD-10-CM

## 2015-09-23 LAB — CBC
HCT: 42.6 % (ref 35.0–47.0)
Hemoglobin: 14 g/dL (ref 12.0–16.0)
MCH: 26.8 pg (ref 26.0–34.0)
MCHC: 32.8 g/dL (ref 32.0–36.0)
MCV: 81.9 fL (ref 80.0–100.0)
Platelets: 198 10*3/uL (ref 150–440)
RBC: 5.2 MIL/uL (ref 3.80–5.20)
RDW: 13 % (ref 11.5–14.5)
WBC: 5.1 10*3/uL (ref 3.6–11.0)

## 2015-09-23 MED ORDER — MELOXICAM 7.5 MG PO TABS: 7.5000 mg | ORAL_TABLET | Freq: Every day | ORAL | Status: DC

## 2015-09-23 NOTE — Patient Instructions (Signed)
Patient to return in six weeks.  

## 2015-09-23 NOTE — Progress Notes (Signed)
Patient ID: Michelle Walker, female   DOB: 12/12/1960, 54 y.o.   MRN: [id number]  Chief Complaint  Patient presents with  . Other    pain at right axillary site    HPI Michelle Walker is a 54 y.o. female here today for pist op follow up.  Lumpectomy and sentinel node biopsy was done on 08/22/2015. Radiation was started last week.She states she has been having pain in her right arm for about thee week. Patient states it feels like someone is pulling her arm. I have reviewed the history of present illness with the patient. HPI  Past Medical History  Diagnosis Date  . Arthritis   . Anemia   . Thyroid disease   . Hypertension   . Cancer ([place])   . Headache     Past Surgical History  Procedure Laterality Date  . Colonoscopy w/ polypectomy  2014  . Dilation and curettage of uterus      20 years ago  . Anterior cruciate ligament repair Right   . Novasure ablation    . Breast lumpectomy with sentinel lymph node biopsy Right 08/22/2015    Procedure: BREAST LUMPECTOMY WITH SENTINEL LYMPH NODE BX;  Surgeon: Christene Lye, MD;  Location: [facility] ORS;  Service: General;  Laterality: Right;  . Breast biopsy Right 07/17/2015    INVASIVE LOBULAR CARCINOMA, CLASSIC TYPE (1.5 MM), ARISING IN A     Family History  Problem Relation Age of Onset  . Stroke Mother   . Cancer Sister     sarcoma on arm; chemo    Social History Social History  Substance Use Topics  . Smoking status: Current Every Day Smoker -- 0.50 packs/day for 35 years    Types: Cigarettes  . Smokeless tobacco: Never Used  . Alcohol Use: 1.2 oz/week    2 Standard drinks or equivalent per week     Comment: BEER 1-2 QD    Allergies  Allergen Reactions  . Tape     SURGICAL TAPE AFTER BREAST BIOPSY    Current Outpatient Prescriptions  Medication Sig Dispense Refill  . allopurinol (ZYLOPRIM) 100 MG tablet Take 100 mg by mouth at bedtime.    [name] amLODipine (NORVASC) 10 MG tablet Take 10 mg by mouth at bedtime.   0   . losartan (COZAAR) 100 MG tablet Take 100 mg by mouth at bedtime.   0  . meloxicam (MOBIC) 7.5 MG tablet Take 1 tablet (7.5 mg total) by mouth daily. 30 tablet 0  . metoprolol succinate (TOPROL-XL) 25 MG 24 hr tablet Take 25 mg by mouth at bedtime.   0  . Vitamin D, Ergocalciferol, (DRISDOL) 50000 UNITS CAPS capsule Take 50,000 Units by mouth every 7 (seven) days. On Thursday  0   No current facility-administered medications for this visit.    Review of Systems Review of Systems  Constitutional: Negative.   Respiratory: Negative.   Cardiovascular: Negative.     Blood pressure 130/78, pulse 76, resp. rate 12, height 5\' 5"  (1.651 m), weight 190 lb (86.183 kg).  Physical Exam Physical Exam  Constitutional: She is oriented to person, place, and time. She appears well-nourished.  Pulmonary/Chest: Right breast exhibits no inverted nipple, no mass, no nipple discharge, no skin change and no tenderness.  Lumpectomy site and node biopsy  Sites are clean and healed.  Musculoskeletal:       Arms: Lymphadenopathy:    She has no cervical adenopathy.    She has no axillary adenopathy.  Neurological:  She is alert and oriented to person, place, and time.  Skin: Skin is warm and dry.    Data Reviewed Notes reviewed   Assessment   CA right breast, now receiving radiation. Superficial phlebitis right arm. Rx with heat, Mobic 7.5 mg daily.  Pt advised fully     Plan   Rx sent for Mobic 7.5 mg daily     Patient to use heat prn. PCP:  Vidal Schwalbe 09/23/2015, 11:14 AM

## 2015-09-24 ENCOUNTER — Ambulatory Visit: Payer: Managed Care, Other (non HMO)

## 2015-09-24 ENCOUNTER — Inpatient Hospital Stay: Payer: Managed Care, Other (non HMO)

## 2015-09-29 ENCOUNTER — Ambulatory Visit
Admission: RE | Admit: 2015-09-29 | Discharge: 2015-09-29 | Disposition: A | Discharge status: Home / Self Care | Payer: Managed Care, Other (non HMO) | Source: Ambulatory Visit | Attending: Radiation Oncology | Admitting: Radiation Oncology

## 2015-09-29 ENCOUNTER — Ambulatory Visit: Payer: Managed Care, Other (non HMO)

## 2015-09-29 DIAGNOSIS — Z510 Encounter for antineoplastic radiation therapy: Secondary | ICD-10-CM | POA: Diagnosis not present

## 2015-09-30 ENCOUNTER — Ambulatory Visit
Admission: RE | Admit: 2015-09-30 | Discharge: 2015-09-30 | Disposition: A | Discharge status: Home / Self Care | Payer: Managed Care, Other (non HMO) | Source: Ambulatory Visit | Attending: Radiation Oncology | Admitting: Radiation Oncology

## 2015-09-30 ENCOUNTER — Ambulatory Visit: Payer: Managed Care, Other (non HMO)

## 2015-09-30 DIAGNOSIS — Z510 Encounter for antineoplastic radiation therapy: Secondary | ICD-10-CM | POA: Diagnosis not present

## 2015-10-01 ENCOUNTER — Ambulatory Visit
Admission: RE | Admit: 2015-10-01 | Discharge: 2015-10-01 | Disposition: A | Discharge status: Home / Self Care | Payer: Managed Care, Other (non HMO) | Source: Ambulatory Visit | Attending: Radiation Oncology | Admitting: Radiation Oncology

## 2015-10-01 ENCOUNTER — Ambulatory Visit: Payer: Managed Care, Other (non HMO)

## 2015-10-01 DIAGNOSIS — Z510 Encounter for antineoplastic radiation therapy: Secondary | ICD-10-CM | POA: Diagnosis not present

## 2015-10-02 ENCOUNTER — Ambulatory Visit
Admission: RE | Admit: 2015-10-02 | Discharge: 2015-10-02 | Disposition: A | Discharge status: Home / Self Care | Payer: Managed Care, Other (non HMO) | Source: Ambulatory Visit | Attending: Radiation Oncology | Admitting: Radiation Oncology

## 2015-10-02 ENCOUNTER — Ambulatory Visit: Admission: RE | Admit: 2015-10-02 | Payer: Managed Care, Other (non HMO) | Source: Ambulatory Visit

## 2015-10-02 ENCOUNTER — Ambulatory Visit: Payer: Managed Care, Other (non HMO)

## 2015-10-03 ENCOUNTER — Ambulatory Visit
Admission: RE | Admit: 2015-10-03 | Discharge: 2015-10-03 | Disposition: A | Discharge status: Home / Self Care | Payer: Managed Care, Other (non HMO) | Source: Ambulatory Visit | Attending: Radiation Oncology | Admitting: Radiation Oncology

## 2015-10-03 ENCOUNTER — Ambulatory Visit: Payer: Managed Care, Other (non HMO)

## 2015-10-03 DIAGNOSIS — Z510 Encounter for antineoplastic radiation therapy: Secondary | ICD-10-CM | POA: Diagnosis not present

## 2015-10-06 ENCOUNTER — Ambulatory Visit: Payer: Managed Care, Other (non HMO)

## 2015-10-06 ENCOUNTER — Ambulatory Visit
Admission: RE | Admit: 2015-10-06 | Discharge: 2015-10-06 | Disposition: A | Discharge status: Home / Self Care | Payer: Managed Care, Other (non HMO) | Source: Ambulatory Visit | Attending: Radiation Oncology | Admitting: Radiation Oncology

## 2015-10-06 DIAGNOSIS — Z510 Encounter for antineoplastic radiation therapy: Secondary | ICD-10-CM | POA: Diagnosis not present

## 2015-10-07 ENCOUNTER — Ambulatory Visit: Payer: Managed Care, Other (non HMO)

## 2015-10-07 ENCOUNTER — Ambulatory Visit
Admission: RE | Admit: 2015-10-07 | Discharge: 2015-10-07 | Disposition: A | Discharge status: Home / Self Care | Payer: Managed Care, Other (non HMO) | Source: Ambulatory Visit | Attending: Radiation Oncology | Admitting: Radiation Oncology

## 2015-10-07 DIAGNOSIS — Z510 Encounter for antineoplastic radiation therapy: Secondary | ICD-10-CM | POA: Diagnosis not present

## 2015-10-08 ENCOUNTER — Ambulatory Visit: Payer: Managed Care, Other (non HMO)

## 2015-10-08 ENCOUNTER — Inpatient Hospital Stay: Payer: Managed Care, Other (non HMO) | Attending: Radiation Oncology

## 2015-10-08 ENCOUNTER — Ambulatory Visit
Admission: RE | Admit: 2015-10-08 | Discharge: 2015-10-08 | Disposition: A | Discharge status: Home / Self Care | Payer: Managed Care, Other (non HMO) | Source: Ambulatory Visit | Attending: Radiation Oncology | Admitting: Radiation Oncology

## 2015-10-08 DIAGNOSIS — C50411 Malignant neoplasm of upper-outer quadrant of right female breast: Secondary | ICD-10-CM | POA: Diagnosis present

## 2015-10-08 DIAGNOSIS — Z510 Encounter for antineoplastic radiation therapy: Secondary | ICD-10-CM | POA: Diagnosis not present

## 2015-10-08 LAB — CBC
HCT: 42.2 % (ref 35.0–47.0)
Hemoglobin: 13.7 g/dL (ref 12.0–16.0)
MCH: 26.7 pg (ref 26.0–34.0)
MCHC: 32.5 g/dL (ref 32.0–36.0)
MCV: 82.3 fL (ref 80.0–100.0)
Platelets: 176 10*3/uL (ref 150–440)
RBC: 5.12 MIL/uL (ref 3.80–5.20)
RDW: 12.5 % (ref 11.5–14.5)
WBC: 4.6 10*3/uL (ref 3.6–11.0)

## 2015-10-09 ENCOUNTER — Ambulatory Visit: Payer: Managed Care, Other (non HMO)

## 2015-10-09 ENCOUNTER — Ambulatory Visit
Admission: RE | Admit: 2015-10-09 | Discharge: 2015-10-09 | Disposition: A | Discharge status: Home / Self Care | Payer: Managed Care, Other (non HMO) | Source: Ambulatory Visit | Attending: Radiation Oncology | Admitting: Radiation Oncology

## 2015-10-09 DIAGNOSIS — Z510 Encounter for antineoplastic radiation therapy: Secondary | ICD-10-CM | POA: Diagnosis not present

## 2015-10-10 ENCOUNTER — Ambulatory Visit
Admission: RE | Admit: 2015-10-10 | Discharge: 2015-10-10 | Disposition: A | Discharge status: Home / Self Care | Payer: Managed Care, Other (non HMO) | Source: Ambulatory Visit | Attending: Radiation Oncology | Admitting: Radiation Oncology

## 2015-10-10 ENCOUNTER — Ambulatory Visit: Payer: Managed Care, Other (non HMO)

## 2015-10-10 DIAGNOSIS — Z510 Encounter for antineoplastic radiation therapy: Secondary | ICD-10-CM | POA: Diagnosis not present

## 2015-10-13 ENCOUNTER — Ambulatory Visit: Payer: Managed Care, Other (non HMO)

## 2015-10-13 ENCOUNTER — Ambulatory Visit
Admission: RE | Admit: 2015-10-13 | Discharge: 2015-10-13 | Disposition: A | Discharge status: Home / Self Care | Payer: Managed Care, Other (non HMO) | Source: Ambulatory Visit | Attending: Radiation Oncology | Admitting: Radiation Oncology

## 2015-10-13 DIAGNOSIS — Z510 Encounter for antineoplastic radiation therapy: Secondary | ICD-10-CM | POA: Diagnosis not present

## 2015-10-14 ENCOUNTER — Encounter: Payer: Self-pay | Admitting: Pathology

## 2015-10-14 ENCOUNTER — Ambulatory Visit
Admission: RE | Admit: 2015-10-14 | Discharge: 2015-10-14 | Disposition: A | Discharge status: Home / Self Care | Payer: Managed Care, Other (non HMO) | Source: Ambulatory Visit | Attending: Radiation Oncology | Admitting: Radiation Oncology

## 2015-10-14 ENCOUNTER — Ambulatory Visit: Payer: Managed Care, Other (non HMO)

## 2015-10-14 DIAGNOSIS — Z510 Encounter for antineoplastic radiation therapy: Secondary | ICD-10-CM | POA: Diagnosis not present

## 2015-10-15 ENCOUNTER — Ambulatory Visit: Payer: Managed Care, Other (non HMO)

## 2015-10-15 ENCOUNTER — Ambulatory Visit
Admission: RE | Admit: 2015-10-15 | Discharge: 2015-10-15 | Disposition: A | Discharge status: Home / Self Care | Payer: Managed Care, Other (non HMO) | Source: Ambulatory Visit | Attending: Radiation Oncology | Admitting: Radiation Oncology

## 2015-10-15 DIAGNOSIS — Z510 Encounter for antineoplastic radiation therapy: Secondary | ICD-10-CM | POA: Diagnosis not present

## 2015-10-16 ENCOUNTER — Ambulatory Visit: Payer: Managed Care, Other (non HMO)

## 2015-10-16 ENCOUNTER — Ambulatory Visit
Admission: RE | Admit: 2015-10-16 | Discharge: 2015-10-16 | Disposition: A | Discharge status: Home / Self Care | Payer: Managed Care, Other (non HMO) | Source: Ambulatory Visit | Attending: Radiation Oncology | Admitting: Radiation Oncology

## 2015-10-16 DIAGNOSIS — Z510 Encounter for antineoplastic radiation therapy: Secondary | ICD-10-CM | POA: Diagnosis not present

## 2015-10-17 ENCOUNTER — Ambulatory Visit
Admission: RE | Admit: 2015-10-17 | Discharge: 2015-10-17 | Disposition: A | Discharge status: Home / Self Care | Payer: Managed Care, Other (non HMO) | Source: Ambulatory Visit | Attending: Radiation Oncology | Admitting: Radiation Oncology

## 2015-10-17 ENCOUNTER — Ambulatory Visit: Payer: Managed Care, Other (non HMO)

## 2015-10-17 DIAGNOSIS — Z510 Encounter for antineoplastic radiation therapy: Secondary | ICD-10-CM | POA: Diagnosis not present

## 2015-10-20 ENCOUNTER — Ambulatory Visit
Admission: RE | Admit: 2015-10-20 | Discharge: 2015-10-20 | Disposition: A | Discharge status: Home / Self Care | Payer: Managed Care, Other (non HMO) | Source: Ambulatory Visit | Attending: Radiation Oncology | Admitting: Radiation Oncology

## 2015-10-20 ENCOUNTER — Ambulatory Visit: Payer: Managed Care, Other (non HMO)

## 2015-10-20 DIAGNOSIS — Z510 Encounter for antineoplastic radiation therapy: Secondary | ICD-10-CM | POA: Diagnosis not present

## 2015-10-21 ENCOUNTER — Ambulatory Visit: Payer: Managed Care, Other (non HMO)

## 2015-10-21 ENCOUNTER — Ambulatory Visit
Admission: RE | Admit: 2015-10-21 | Discharge: 2015-10-21 | Disposition: A | Discharge status: Home / Self Care | Payer: Managed Care, Other (non HMO) | Source: Ambulatory Visit | Attending: Radiation Oncology | Admitting: Radiation Oncology

## 2015-10-21 DIAGNOSIS — Z510 Encounter for antineoplastic radiation therapy: Secondary | ICD-10-CM | POA: Diagnosis not present

## 2015-10-22 ENCOUNTER — Inpatient Hospital Stay: Payer: Managed Care, Other (non HMO)

## 2015-10-22 ENCOUNTER — Ambulatory Visit: Payer: Managed Care, Other (non HMO)

## 2015-10-23 ENCOUNTER — Ambulatory Visit: Payer: Managed Care, Other (non HMO)

## 2015-10-23 ENCOUNTER — Ambulatory Visit
Admission: RE | Admit: 2015-10-23 | Discharge: 2015-10-23 | Disposition: A | Discharge status: Home / Self Care | Payer: Managed Care, Other (non HMO) | Source: Ambulatory Visit | Attending: Radiation Oncology | Admitting: Radiation Oncology

## 2015-10-23 DIAGNOSIS — Z510 Encounter for antineoplastic radiation therapy: Secondary | ICD-10-CM | POA: Diagnosis not present

## 2015-10-24 ENCOUNTER — Ambulatory Visit
Admission: RE | Admit: 2015-10-24 | Discharge: 2015-10-24 | Disposition: A | Discharge status: Home / Self Care | Payer: Managed Care, Other (non HMO) | Source: Ambulatory Visit | Attending: Radiation Oncology | Admitting: Radiation Oncology

## 2015-10-24 ENCOUNTER — Ambulatory Visit: Payer: Managed Care, Other (non HMO)

## 2015-10-24 DIAGNOSIS — Z510 Encounter for antineoplastic radiation therapy: Secondary | ICD-10-CM | POA: Diagnosis not present

## 2015-10-27 ENCOUNTER — Ambulatory Visit: Payer: Managed Care, Other (non HMO)

## 2015-10-28 ENCOUNTER — Ambulatory Visit
Admission: RE | Admit: 2015-10-28 | Discharge: 2015-10-28 | Disposition: A | Discharge status: Home / Self Care | Payer: Managed Care, Other (non HMO) | Source: Ambulatory Visit | Attending: Radiation Oncology | Admitting: Radiation Oncology

## 2015-10-28 ENCOUNTER — Ambulatory Visit: Payer: Managed Care, Other (non HMO)

## 2015-10-28 DIAGNOSIS — Z510 Encounter for antineoplastic radiation therapy: Secondary | ICD-10-CM | POA: Diagnosis not present

## 2015-10-29 ENCOUNTER — Ambulatory Visit: Payer: Managed Care, Other (non HMO)

## 2015-10-29 DIAGNOSIS — Z510 Encounter for antineoplastic radiation therapy: Secondary | ICD-10-CM | POA: Diagnosis not present

## 2015-10-30 ENCOUNTER — Ambulatory Visit
Admission: RE | Admit: 2015-10-30 | Discharge: 2015-10-30 | Disposition: A | Discharge status: Home / Self Care | Payer: Managed Care, Other (non HMO) | Source: Ambulatory Visit | Attending: Radiation Oncology | Admitting: Radiation Oncology

## 2015-10-30 ENCOUNTER — Ambulatory Visit: Payer: Managed Care, Other (non HMO)

## 2015-10-30 DIAGNOSIS — Z510 Encounter for antineoplastic radiation therapy: Secondary | ICD-10-CM | POA: Diagnosis not present

## 2015-10-31 ENCOUNTER — Ambulatory Visit
Admission: RE | Admit: 2015-10-31 | Discharge: 2015-10-31 | Disposition: A | Discharge status: Home / Self Care | Payer: Managed Care, Other (non HMO) | Source: Ambulatory Visit | Attending: Radiation Oncology | Admitting: Radiation Oncology

## 2015-10-31 DIAGNOSIS — Z510 Encounter for antineoplastic radiation therapy: Secondary | ICD-10-CM | POA: Diagnosis not present

## 2015-11-03 ENCOUNTER — Ambulatory Visit: Payer: Managed Care, Other (non HMO)

## 2015-11-04 ENCOUNTER — Ambulatory Visit
Admission: RE | Admit: 2015-11-04 | Discharge: 2015-11-04 | Disposition: A | Discharge status: Home / Self Care | Payer: Managed Care, Other (non HMO) | Source: Ambulatory Visit | Attending: Radiation Oncology | Admitting: Radiation Oncology

## 2015-11-04 DIAGNOSIS — Z510 Encounter for antineoplastic radiation therapy: Secondary | ICD-10-CM | POA: Diagnosis not present

## 2015-11-05 ENCOUNTER — Ambulatory Visit
Admission: RE | Admit: 2015-11-05 | Discharge: 2015-11-05 | Disposition: A | Discharge status: Home / Self Care | Payer: Managed Care, Other (non HMO) | Source: Ambulatory Visit | Attending: Radiation Oncology | Admitting: Radiation Oncology

## 2015-11-05 ENCOUNTER — Encounter: Payer: Self-pay | Admitting: General Surgery

## 2015-11-05 ENCOUNTER — Ambulatory Visit (INDEPENDENT_AMBULATORY_CARE_PROVIDER_SITE_OTHER): Payer: Managed Care, Other (non HMO) | Admitting: General Surgery

## 2015-11-05 VITALS — BP 146/86 | HR 88 | Resp 12 | Ht 65.0 in | Wt 199.0 lb

## 2015-11-05 DIAGNOSIS — C50911 Malignant neoplasm of unspecified site of right female breast: Secondary | ICD-10-CM

## 2015-11-05 DIAGNOSIS — Z510 Encounter for antineoplastic radiation therapy: Secondary | ICD-10-CM | POA: Diagnosis not present

## 2015-11-05 MED ORDER — TAMOXIFEN CITRATE 20 MG PO TABS: 20.0000 mg | ORAL_TABLET | Freq: Every day | ORAL | Status: DC

## 2015-11-05 NOTE — Patient Instructions (Signed)
Patient to return in three months with a right breast diagnotic mammogram.

## 2015-11-05 NOTE — Progress Notes (Signed)
Patient ID: Michelle Walker, female   DOB: 02-16-1961, 55 y.o.   MRN: [id number]  Chief Complaint  Patient presents with  . Follow-up    breast cancer    HPI Michelle Walker is a 55 y.o. female here today following up from right breast cancer. Patient states she is still doing radiation treatments.Patient last treatment is next Wednesday.  Her right arm is doing better- not swelling much except in dorsum of hand. I have reviewed the history of present illness with the patient.  HPI  Past Medical History  Diagnosis Date  . Arthritis   . Anemia   . Thyroid disease   . Hypertension   . Cancer ([place])   . Headache     Past Surgical History  Procedure Laterality Date  . Colonoscopy w/ polypectomy  2014  . Dilation and curettage of uterus      20 years ago  . Anterior cruciate ligament repair Right   . Novasure ablation    . Breast lumpectomy with sentinel lymph node biopsy Right 08/22/2015    Procedure: BREAST LUMPECTOMY WITH SENTINEL LYMPH NODE BX;  Surgeon: Christene Lye, MD;  Location: [facility] ORS;  Service: General;  Laterality: Right;  . Breast biopsy Right 07/17/2015    INVASIVE LOBULAR CARCINOMA, CLASSIC TYPE (1.5 MM), ARISING IN A     Family History  Problem Relation Age of Onset  . Stroke Mother   . Cancer Sister     sarcoma on arm; chemo    Social History Social History  Substance Use Topics  . Smoking status: Current Every Day Smoker -- 0.50 packs/day for 35 years    Types: Cigarettes  . Smokeless tobacco: Never Used  . Alcohol Use: 1.2 oz/week    2 Standard drinks or equivalent per week     Comment: BEER 1-2 QD    Allergies  Allergen Reactions  . Tape     SURGICAL TAPE AFTER BREAST BIOPSY    Current Outpatient Prescriptions  Medication Sig Dispense Refill  . allopurinol (ZYLOPRIM) 100 MG tablet Take 100 mg by mouth at bedtime.    [name] amLODipine (NORVASC) 10 MG tablet Take 10 mg by mouth at bedtime.   0  . losartan (COZAAR) 100 MG tablet Take 100  mg by mouth at bedtime.   0  . meloxicam (MOBIC) 7.5 MG tablet Take 1 tablet (7.5 mg total) by mouth daily. 30 tablet 0  . metoprolol succinate (TOPROL-XL) 25 MG 24 hr tablet Take 25 mg by mouth at bedtime.   0  . Vitamin D, Ergocalciferol, (DRISDOL) 50000 UNITS CAPS capsule Take 50,000 Units by mouth every 7 (seven) days. On Thursday  0  . tamoxifen (NOLVADEX) 20 MG tablet Take 1 tablet (20 mg total) by mouth daily. 30 tablet 12   No current facility-administered medications for this visit.    Review of Systems Review of Systems  Constitutional: Negative.   Respiratory: Negative.   Cardiovascular: Negative.     Blood pressure 146/86, pulse 88, resp. rate 12, height '5\' 5"'  (1.651 m), weight 199 lb (90.266 kg), last menstrual period 11/02/2015.  Physical Exam Physical Exam  Constitutional: She is oriented to person, place, and time. She appears well-developed and well-nourished.  Eyes: Conjunctivae are normal. No scleral icterus.  Neck: Neck supple.  Pulmonary/Chest: Left breast exhibits skin change ( do to radiation). Left breast exhibits no inverted nipple, no mass, no nipple discharge and no tenderness.  Lymphadenopathy:    She has no cervical  adenopathy.  Neurological: She is alert and oriented to person, place, and time.  Skin: Skin is warm and dry.    Data Reviewed Notes reviewed  Assessment    Right breast stage 1, Invasive Lobular Carcinoma. T1a,N0,M0. ER/PR pos, Her2 neg. Completing radiation. To start on antihormonal therapy. Pt still has periods. Discussed with oncology, will start on Tamoxifen and switch to AI in 1-2 yrs. Pt advised fully including side effects of anti hormonal therapoy    Plan  Start Tamoxifen a couple of days after radiation. Patient to return in three months with a right breast diagnotic mammogram. PCP:  Rosario Jacks,  This information has been scribed by Gaspar Cola CMA.    Brandyce Dimario G 11/05/2015, 9:26 AM

## 2015-11-06 ENCOUNTER — Ambulatory Visit: Payer: Managed Care, Other (non HMO)

## 2015-11-06 ENCOUNTER — Ambulatory Visit
Admission: RE | Admit: 2015-11-06 | Discharge: 2015-11-06 | Disposition: A | Discharge status: Home / Self Care | Payer: Managed Care, Other (non HMO) | Source: Ambulatory Visit | Attending: Radiation Oncology | Admitting: Radiation Oncology

## 2015-11-06 DIAGNOSIS — Z510 Encounter for antineoplastic radiation therapy: Secondary | ICD-10-CM | POA: Diagnosis not present

## 2015-11-06 NOTE — Addendum Note (Signed)
Encounter addended by: Coral Spikes, Rad Tech on: 11/06/2015  1:21 PM<BR>     Documentation filed: Charges VN

## 2015-11-06 NOTE — Addendum Note (Signed)
Encounter addended by: Wayne Both on: 11/06/2015  1:45 PM<BR>     Documentation filed: Charges VN

## 2015-11-07 ENCOUNTER — Ambulatory Visit
Admission: RE | Admit: 2015-11-07 | Discharge: 2015-11-07 | Disposition: A | Discharge status: Home / Self Care | Payer: Managed Care, Other (non HMO) | Source: Ambulatory Visit | Attending: Radiation Oncology | Admitting: Radiation Oncology

## 2015-11-07 ENCOUNTER — Ambulatory Visit: Payer: Managed Care, Other (non HMO)

## 2015-11-07 DIAGNOSIS — Z510 Encounter for antineoplastic radiation therapy: Secondary | ICD-10-CM | POA: Diagnosis not present

## 2015-11-10 ENCOUNTER — Ambulatory Visit
Admission: RE | Admit: 2015-11-10 | Discharge: 2015-11-10 | Disposition: A | Discharge status: Home / Self Care | Payer: Managed Care, Other (non HMO) | Source: Ambulatory Visit | Attending: Radiation Oncology | Admitting: Radiation Oncology

## 2015-11-10 ENCOUNTER — Ambulatory Visit: Payer: Managed Care, Other (non HMO)

## 2015-11-11 ENCOUNTER — Ambulatory Visit
Admission: RE | Admit: 2015-11-11 | Discharge: 2015-11-11 | Disposition: A | Discharge status: Home / Self Care | Payer: Managed Care, Other (non HMO) | Source: Ambulatory Visit | Attending: Radiation Oncology | Admitting: Radiation Oncology

## 2015-11-11 DIAGNOSIS — Z510 Encounter for antineoplastic radiation therapy: Secondary | ICD-10-CM | POA: Diagnosis not present

## 2015-11-12 ENCOUNTER — Ambulatory Visit: Payer: Managed Care, Other (non HMO)

## 2015-11-12 ENCOUNTER — Ambulatory Visit
Admission: RE | Admit: 2015-11-12 | Discharge: 2015-11-12 | Disposition: A | Discharge status: Home / Self Care | Payer: Managed Care, Other (non HMO) | Source: Ambulatory Visit | Attending: Radiation Oncology | Admitting: Radiation Oncology

## 2015-11-12 DIAGNOSIS — Z510 Encounter for antineoplastic radiation therapy: Secondary | ICD-10-CM | POA: Diagnosis not present

## 2015-11-13 ENCOUNTER — Ambulatory Visit
Admission: RE | Admit: 2015-11-13 | Discharge: 2015-11-13 | Disposition: A | Discharge status: Home / Self Care | Payer: Managed Care, Other (non HMO) | Source: Ambulatory Visit | Attending: Radiation Oncology | Admitting: Radiation Oncology

## 2015-11-13 DIAGNOSIS — Z510 Encounter for antineoplastic radiation therapy: Secondary | ICD-10-CM | POA: Diagnosis not present

## 2015-12-02 ENCOUNTER — Other Ambulatory Visit: Payer: Self-pay | Admitting: *Deleted

## 2015-12-02 DIAGNOSIS — C50411 Malignant neoplasm of upper-outer quadrant of right female breast: Secondary | ICD-10-CM

## 2015-12-09 ENCOUNTER — Encounter: Payer: Self-pay | Admitting: *Deleted

## 2015-12-09 NOTE — Progress Notes (Signed)
  Oncology Nurse Navigator Documentation  Navigator Location: [facility] (12/09/15 1300) Navigator Encounter Type: Letter/Fax/Email (card) (12/09/15 1300)           Patient Visit Type: Other (12/09/15 1300)                              Time Spent with Patient: 15 (12/09/15 1300)   Thinking of you card mailed to patient.

## 2015-12-10 ENCOUNTER — Encounter: Payer: Self-pay | Admitting: *Deleted

## 2015-12-11 ENCOUNTER — Telehealth: Payer: Self-pay | Admitting: *Deleted

## 2015-12-11 MED ORDER — FLUCONAZOLE 150 MG PO TABS: 150.0000 mg | ORAL_TABLET | ORAL | Status: DC

## 2015-12-11 NOTE — Telephone Encounter (Signed)
She called with complaints of a yeast infection with vaginal burning and itching.  She completed her radiation 11-13-15 and started her tamoxifen 2 weeks ago. She has tried OTC creams/ointments with no relief. She would like diflucan, ok per Dr Jamal Collin #2. RX sent

## 2015-12-15 ENCOUNTER — Ambulatory Visit: Payer: Managed Care, Other (non HMO) | Attending: Radiation Oncology | Admitting: Radiation Oncology

## 2015-12-17 ENCOUNTER — Encounter: Payer: Self-pay | Admitting: *Deleted

## 2016-01-01 ENCOUNTER — Ambulatory Visit (INDEPENDENT_AMBULATORY_CARE_PROVIDER_SITE_OTHER): Payer: Managed Care, Other (non HMO) | Admitting: Podiatry

## 2016-01-01 ENCOUNTER — Encounter: Payer: Self-pay | Admitting: Podiatry

## 2016-01-01 VITALS — BP 141/92 | HR 94 | Resp 18

## 2016-01-01 DIAGNOSIS — B351 Tinea unguium: Secondary | ICD-10-CM | POA: Diagnosis not present

## 2016-01-01 DIAGNOSIS — L600 Ingrowing nail: Secondary | ICD-10-CM | POA: Diagnosis not present

## 2016-01-01 NOTE — Patient Instructions (Signed)
Ingrown Toenail  An ingrown toenail occurs when the corner or sides of your toenail grow into the surrounding skin. The big toe is most commonly affected, but it can happen to any of your toes. If your ingrown toenail is not treated, you will be at risk for infection.  CAUSES  This condition may be caused by:  · Wearing shoes that are too small or tight.  · Injury or trauma, such as stubbing your toe or having your toe stepped on.  · Improper cutting or care of your toenails.  · Being born with (congenital) nail or foot abnormalities, such as having a nail that is too big for your toe.  RISK FACTORS  Risk factors for an ingrown toenail include:  · Age. Your nails tend to thicken as you get older, so ingrown nails are more common in older people.  · Diabetes.  · Cutting your toenails incorrectly.  · Blood circulation problems.  SYMPTOMS  Symptoms may include:  · Pain, soreness, or tenderness.  · Redness.  · Swelling.  · Hardening of the skin surrounding the toe.  Your ingrown toenail may be infected if there is fluid, pus, or drainage.  DIAGNOSIS   An ingrown toenail may be diagnosed by medical history and physical exam. If your toenail is infected, your health care provider may test a sample of the drainage.  TREATMENT  Treatment depends on the severity of your ingrown toenail. Some ingrown toenails may be treated at home. More severe or infected ingrown toenails may require surgery to remove all or part of the nail. Infected ingrown toenails may also be treated with antibiotic medicines.  HOME CARE INSTRUCTIONS  · If you were prescribed an antibiotic medicine, finish all of it even if you start to feel better.  · Soak your foot in warm soapy water for 20 minutes, 3 times per day or as directed by your health care provider.  · Carefully lift the edge of the nail away from the sore skin by wedging a small piece of cotton under the corner of the nail. This may help with the pain.  Be careful not to cause more injury  to the area.  · Wear shoes that fit well. If your ingrown toenail is causing you pain, try wearing sandals, if possible.  · Trim your toenails regularly and carefully. Do not cut them in a curved shape. Cut your toenails straight across. This prevents injury to the skin at the corners of the toenail.  · Keep your feet clean and dry.  · If you are having trouble walking and are given crutches by your health care provider, use them as directed.  · Do not pick at your toenail or try to remove it yourself.  · Take medicines only as directed by your health care provider.  · Keep all follow-up visits as directed by your health care provider. This is important.  SEEK MEDICAL CARE IF:  · Your symptoms do not improve with treatment.  SEEK IMMEDIATE MEDICAL CARE IF:  · You have red streaks that start at your foot and go up your leg.  · You have a fever.  · You have increased redness, swelling, or pain.  · You have fluid, blood, or pus coming from your toenail.     This information is not intended to replace advice given to you by your health care provider. Make sure you discuss any questions you have with your health care provider.     Document Released:   10/15/2000 Document Revised: 03/04/2015 Document Reviewed: 09/11/2014  Elsevier Interactive Patient Education ©2016 [employer]

## 2016-01-01 NOTE — Progress Notes (Signed)
   Subjective:    Patient ID: Michelle Walker, female    DOB: Sep 23, 1961, 55 y.o.   MRN: [id number]  HPI  55 year old female presents the office they for concerns of an ingrown toenail to the right big toe. She said that she had a pedicure done 3 weeks ago and afterwards the toenail became very sore. She denies any redness or drainage or any swelling. She was taking ibuprofen which seems to help. She states that over the last day or so the pain is resolved however. No other complaints at this time.   Review of Systems  All other systems reviewed and are negative.      Objective:   Physical Exam General: AAO x3, NAD  Dermatological: Nails appear to be hypertrophic, dystrophic, brittle, discolored. There is incurvation along the nail border of the right hallux toenail. There is no edema, erythema, drainage or pus or other signs of infection. There is no tenderness the other nails.  Vascular:  DP/PT pulses 2/4, CRT less than 3 seconds. There is no pain with calf compression, swelling, warmth, erythema.   Neruologic: Grossly intact via light touch bilateral. Vibratory intact via tuning fork bilateral. Protective threshold with Semmes Wienstein monofilament intact to all pedal sites bilateral. Patellar and Achilles deep tendon reflexes 2+ bilateral. No Babinski or clonus noted bilateral.   Musculoskeletal: No gross boney pedal deformities bilateral. No pain, crepitus, or limitation noted with foot and ankle range of motion bilateral. Muscular strength 5/5 in all groups tested bilateral.  Gait: Unassisted, Nonantalgic.      Assessment & Plan:   55 year old female ingrown toenail right hallux without infection  -Treatment options discussed including all alternatives, risks, and complications -Etiology of symptoms were discussed -Discussed with a partial nail avulsion however she wishes to hold off. That is one of the nail was debrided without any complications or bleeding. Epson salt soaks  recommend it. If the area continues to be symptomatic she'll likely have the partial nail avulsion performed.  -Follow-up visit symptoms continue or worsen or any change. Call if questions or concerns.   Celesta Gentile, DPM

## 2016-02-03 ENCOUNTER — Other Ambulatory Visit: Payer: Self-pay | Admitting: General Surgery

## 2016-02-03 ENCOUNTER — Ambulatory Visit
Admission: RE | Admit: 2016-02-03 | Discharge: 2016-02-03 | Disposition: A | Discharge status: Home / Self Care | Payer: Managed Care, Other (non HMO) | Source: Ambulatory Visit | Attending: General Surgery | Admitting: General Surgery

## 2016-02-03 DIAGNOSIS — C50411 Malignant neoplasm of upper-outer quadrant of right female breast: Secondary | ICD-10-CM

## 2016-02-03 DIAGNOSIS — Z853 Personal history of malignant neoplasm of breast: Secondary | ICD-10-CM | POA: Diagnosis present

## 2016-02-03 DIAGNOSIS — N63 Unspecified lump in breast: Secondary | ICD-10-CM | POA: Insufficient documentation

## 2016-02-03 HISTORY — DX: Malignant neoplasm of unspecified site of unspecified female breast: C50.919

## 2016-02-04 ENCOUNTER — Other Ambulatory Visit: Payer: Managed Care, Other (non HMO)

## 2016-02-09 ENCOUNTER — Encounter: Payer: Self-pay | Admitting: General Surgery

## 2016-02-09 ENCOUNTER — Ambulatory Visit (INDEPENDENT_AMBULATORY_CARE_PROVIDER_SITE_OTHER): Payer: Managed Care, Other (non HMO) | Admitting: General Surgery

## 2016-02-09 VITALS — BP 124/72 | HR 74 | Resp 14 | Ht 66.0 in | Wt 197.0 lb

## 2016-02-09 DIAGNOSIS — C50411 Malignant neoplasm of upper-outer quadrant of right female breast: Secondary | ICD-10-CM

## 2016-02-09 NOTE — Progress Notes (Signed)
Patient ID: Michelle Walker, female   DOB: 04/23/1961, 55 y.o.   MRN: [id number]  Chief Complaint  Patient presents with  . Follow-up    mammogram    HPI Michelle Walker is a 55 y.o. female.  who presents for a breast cancer follow up. The most recent mammogram was done on 02-03-16 .  Patient does perform regular self breast checks and gets regular mammograms done.  Only c/o some mild pain in right breast lumpectomy site. I have reviewed the history of present illness with the patient.  HPI  Past Medical History  Diagnosis Date  . Arthritis   . Anemia   . Thyroid disease   . Hypertension   . Cancer ([place])   . Headache   . Breast cancer [facility])     Radiation + right breast    Past Surgical History  Procedure Laterality Date  . Colonoscopy w/ polypectomy  2014  . Dilation and curettage of uterus      20 years ago  . Anterior cruciate ligament repair Right   . Novasure ablation    . Breast lumpectomy with sentinel lymph node biopsy Right 08/22/2015    Procedure: BREAST LUMPECTOMY WITH SENTINEL LYMPH NODE BX;  Surgeon: Michelle Lye, MD;  Location: [facility] ORS;  Service: General;  Laterality: Right;  . Breast biopsy Right 07/17/2015    INVASIVE LOBULAR CARCINOMA, CLASSIC TYPE (1.5 MM), ARISING IN A     Family History  Problem Relation Age of Onset  . Stroke Mother   . Cancer Sister     sarcoma on arm; chemo    Social History Social History  Substance Use Topics  . Smoking status: Current Every Day Smoker -- 0.50 packs/day for 35 years    Types: Cigarettes  . Smokeless tobacco: Never Used  . Alcohol Use: 1.2 oz/week    2 Standard drinks or equivalent per week     Comment: BEER 1-2 QD    Allergies  Allergen Reactions  . Tape     SURGICAL TAPE AFTER BREAST BIOPSY    Current Outpatient Prescriptions  Medication Sig Dispense Refill  . allopurinol (ZYLOPRIM) 100 MG tablet Take 100 mg by mouth at bedtime.    [name] amLODipine (NORVASC) 10 MG tablet Take 10 mg by mouth  at bedtime.   0  . fluconazole (DIFLUCAN) 150 MG tablet Take 1 tablet (150 mg total) by mouth as directed. Take one tablet today and then one tablet 2 days later 2 tablet 0  . losartan (COZAAR) 100 MG tablet Take 100 mg by mouth at bedtime.   0  . meloxicam (MOBIC) 7.5 MG tablet Take 1 tablet (7.5 mg total) by mouth daily. 30 tablet 0  . metoprolol succinate (TOPROL-XL) 25 MG 24 hr tablet Take 25 mg by mouth at bedtime.   0  . tamoxifen (NOLVADEX) 20 MG tablet Take 1 tablet (20 mg total) by mouth daily. 30 tablet 12  . Vitamin D, Ergocalciferol, (DRISDOL) 50000 UNITS CAPS capsule Take 50,000 Units by mouth every 7 (seven) days. On Thursday  0   No current facility-administered medications for this visit.    Review of Systems Review of Systems  Constitutional: Negative.   Respiratory: Negative.   Cardiovascular: Negative.     Blood pressure 124/72, pulse 74, resp. rate 14, height 5\' 6"  (1.676 m), weight 197 lb (89.359 kg).  Physical Exam Physical Exam  Constitutional: She is oriented to person, place, and time. She appears well-developed and well-nourished.  Eyes: Conjunctivae are normal. No scleral icterus.  Cardiovascular: Normal rate, regular rhythm and normal heart sounds.   Pulmonary/Chest: Effort normal and breath sounds normal. Right breast exhibits no inverted nipple, no mass, no nipple discharge, no skin change and no tenderness. Left breast exhibits no inverted nipple, no mass, no nipple discharge, no skin change and no tenderness.  Right breast - some radiational changes , lumpectomy scar  Left breast  - normal   Abdominal: Soft. Bowel sounds are normal. She exhibits no mass. There is no hepatomegaly. There is no tenderness.  Lymphadenopathy:    She has no cervical adenopathy.    She has no axillary adenopathy.  Neurological: She is alert and oriented to person, place, and time.  Skin: Skin is warm and dry.    Data Reviewed Mammogram reviewed-post surgical changes.    Assessment    Stable Exam. Invasive lobular cancer right breast, T1,N0,M0. ER/PR pos, her 2 neg. 60mos post lumpectomy/radiation    Plan    The patient has been asked to return to the office in 6 months with a bilateral diagnostic mammogram.  Consider switching off of Tamoxofen  To AI in one year.   She has not had  GYN eval in a while. Advised on need to be monitored for endometrial/ovarian CA     An appointment has been scheduled for patient to see Dr. Enzo Walker at [facility] for 02-26-16 at 1:30 pm.  PCP:  Michelle Walker This information has been scribed by Michelle Walker CMA.  SANKAR,SEEPLAPUTHUR G 02/09/2016, 1:42 PM

## 2016-02-09 NOTE — Patient Instructions (Addendum)
The patient has been asked to return to the office in 6 months with a bilateral diagnostic mammogram.  Concider switching off of Tamoxofen in one year.  An appointment has been scheduled for patient to see Dr. Enzo Bi at [facility] for 02-26-16 at 1:30 pm.

## 2016-02-26 ENCOUNTER — Ambulatory Visit (INDEPENDENT_AMBULATORY_CARE_PROVIDER_SITE_OTHER): Payer: Managed Care, Other (non HMO) | Admitting: Obstetrics and Gynecology

## 2016-02-26 ENCOUNTER — Encounter: Payer: Self-pay | Admitting: Obstetrics and Gynecology

## 2016-02-26 VITALS — BP 126/71 | HR 84 | Ht 66.0 in | Wt 193.0 lb

## 2016-02-26 DIAGNOSIS — Z9889 Other specified postprocedural states: Secondary | ICD-10-CM | POA: Diagnosis not present

## 2016-02-26 DIAGNOSIS — C50919 Malignant neoplasm of unspecified site of unspecified female breast: Secondary | ICD-10-CM | POA: Diagnosis not present

## 2016-02-26 NOTE — Patient Instructions (Signed)
1. Pelvic ultrasound is ordered 2. Follow-up in 3 months for repeat examination and ultrasound

## 2016-02-26 NOTE — Progress Notes (Signed)
Patient ID: Michelle Walker, female   DOB: [birth year], 55 y.o.   MRN: [id number] ANNUAL PRE[facility]IVE C[facility] GYN  ENCOUNTER NOTE  Subjective:       Michelle Walker is a 55 y.o. G19P2002 female here For consultation regarding tamoxifen therapy and breast cancer.   Patient has history of invasive lobular breast cancer, right, T1, and 00, ER/PR positive, HER-2/neu negative. Status post lumpectomy with sentinel node biopsy and XRT therapy Currently on tamoxifen therapy since February 2017  Patient states that she has developed significant vasomotor symptoms since starting tamoxifen therapy. She also states that she DOES not like how she feels on hormone". She cannot specifically described her symptomatology. Patient reports significant concerns about the tamoxifen therapy and has a fear of developing endometrial cancer. This is compounded by the fact that she has had an endometrial ablation and does understand that there is no easy way to monitor for abnormal stimulation of the endometrium.  Gynecologic  History No LMP recorded. Patient has had an ablation. Contraception: none Last Pap: 05/2015 . Results were: normal Last mammogram: 05/2015. Results were: abnormal  Obstetric History OB History  Gravida Para Term Preterm AB SAB TAB Ectopic Multiple Living  '2 2 2 ' 0 0 0 0 0 0 2    # Outcome Date GA Lbr Len/2nd Weight Sex Delivery Anes PTL Lv  2 Term 1999    M Vag-Spont   Y  1 Term 1996    F Vag-Spont   Y    Obstetric Comments  1st Menstrual Cycle:  10  1st Pregnancy:  32    Past Medical History  Diagnosis Date  . Arthritis   . Anemia   . Hypertension   . Headache   . Cancer ([place])   . Breast cancer ([place]) 08/2015    Radiation + right breast  . Thyroid disease     Past Surgical History  Procedure Laterality Date  . Colonoscopy w/ polypectomy  2014  . Anterior cruciate ligament repair Right   . Novasure ablation    . Breast lumpectomy with sentinel lymph node biopsy Right 08/22/2015    Procedure: BREAST LUMPECTOMY WITH SENTINEL LYMPH NODE BX;  Surgeon: Christene Lye, MD;  Location: [facility] ORS;  Service: General;  Laterality: Right;  . Breast biopsy Right 07/17/2015    INVASIVE LOBULAR CARCINOMA, CLASSIC TYPE (1.5 MM), ARISING IN A   . Dilation and curettage of uterus      20 years ago    Current Outpatient Prescriptions on File Prior to Visit  Medication Sig Dispense Refill  . allopurinol (ZYLOPRIM) 100 MG tablet Take 100 mg by mouth at bedtime.    [name] amLODipine (NORVASC) 10 MG tablet Take 10 mg by mouth at bedtime.   0  . losartan (COZAAR) 100 MG tablet Take 100 mg by mouth at bedtime.   0  . metoprolol succinate (TOPROL-XL) 25 MG 24 hr tablet Take 25 mg by mouth at bedtime.   0  . tamoxifen (NOLVADEX) 20 MG tablet Take 1 tablet (20 mg total) by mouth daily. 30 tablet 12  . Vitamin D, Ergocalciferol, (DRISDOL) 50000 UNITS CAPS capsule Take 50,000 Units by mouth every 7 (seven) days. On Thursday  0   No current facility-administered medications on file prior to visit.    Allergies  Allergen Reactions  . Tape     SURGICAL TAPE AFTER BREAST BIOPSY    Social History   Social History  . Marital Status: Married    Spouse  Name: N/A  . Number of Children: N/A  . Years of Education: N/A   Occupational History  . Not on file.   Social History Main Topics  . Smoking status: Current Every Day Smoker -- 0.50 packs/day for 35 years    Types: Cigarettes  . Smokeless tobacco: Never Used  . Alcohol Use: 1.2 oz/week    2 Standard drinks or equivalent per week     Comment: BEER 1-2 QD  . Drug Use: No  . Sexual Activity: Yes     Comment: ablation   Other Topics Concern  . Not on file   Social History Narrative    Family History  Problem Relation Age of Onset  . Stroke Mother   . Cancer Sister     sarcoma on arm; chemo  . Diabetes Sister   . Diabetes Brother   . Breast cancer Neg Hx   . Ovarian cancer Neg Hx   . Colon cancer Neg Hx   . Heart disease  Neg Hx     The following portions of the patient's history were reviewed and updated as appropriate: allergies, current medications, past family history, past medical history, past social history, past surgical history and problem list.  Review of Systems ROS Review of Systems - General ROS: negative for - chills, fatigue, fever, hot flashes, night sweats, weight gain or weight loss Psychological ROS: negative for - anxiety, decreased libido, depression, mood swings, physical abuse or sexual abuse Ophthalmic ROS: negative for - blurry vision, eye pain or loss of vision ENT ROS: negative for - headaches, hearing change, visual changes or vocal changes Allergy and Immunology ROS: negative for - hives, itchy/watery eyes or seasonal allergies Hematological and Lymphatic ROS: negative for - bleeding problems, bruising, swollen lymph nodes or weight loss Endocrine ROS: negative for - galactorrhea, hair pattern changes, hot flashes, malaise/lethargy, mood swings, palpitations, polydipsia/polyuria, skin changes, temperature intolerance or unexpected weight changes Breast ROS: negative for - new or changing breast lumps or nipple discharge Respiratory ROS: negative for - cough or shortness of breath Cardiovascular ROS: negative for - chest pain, irregular heartbeat, palpitations or shortness of breath Gastrointestinal ROS: no abdominal pain, change in bowel habits, or black or bloody stools Genito-Urinary ROS: no dysuria, trouble voiding, or hematuria Musculoskeletal ROS: negative for - joint pain or joint stiffness Neurological ROS: negative for - bowel and bladder control changes Dermatological ROS: negative for rash and skin lesion changes   Objective:   BP 126/71 mmHg  Pulse 84  Ht '5\' 6"'  (1.676 m)  Wt 193 lb (87.544 kg)  BMI 31.17 kg/m2 CONSTITUTIONAL: Well-developed, well-nourished female in no acute distress.  PSYCHIATRIC: Normal mood and affect. Normal behavior. Normal judgment and  thought content. [place]: Alert and oriented to person, place, and time. Normal muscle tone coordination. No cranial nerve deficit noted. HENT:  Normocephalic, atraumatic, External right and left ear normal. Oropharynx is clear and moist EYES: Conjunctivae and EOM are normal. Pupils are equal, round, and reactive to light. No scleral icterus.  NECK: Not examined SKIN: Skin is warm and dry. No rash noted. Not diaphoretic. No erythema. No pallor. CARDIOVASCULAR: Not examined RESPIRATORY: Not examined BREASTS: Not examined ABDOMEN: Soft, normal bowel sounds, no distention noted.  No tenderness, rebound or guarding.  BLADDER: Normal PELVIC:  External Genitalia: Normal  BUS: Normal  Vagina: Normal  Cervix: Normal; no lesions; no cervical motion tenderness  Uterus: Normal; midplane, normal size and shape, mobile, nontender  Adnexa: Normal  RV: External Exam  NormaI  MUSCULOSKELETAL: Normal range of motion. No tenderness.  No cyanosis, clubbing, or edema.  2+ distal pulses. LYMPHATIC: No Axillary, Supraclavicular, or Inguinal Adenopathy.    Assessment:   1. History of invasive lobular cancer, right breast. ER/PR positive; HER-2 negative; status post lumpectomy and radiation therapy 2.  History of endometrial ablation 3. Currently on tamoxifen therapy  Plan:  1. Management options of patients on tamoxifen therapy with history of endometrial ablation are as follows:  Serial ultrasounds to assess for stability of endometrium, with endometrial sampling if ultrasound changes are identified or if patient develops abnormal uterine bleeding or atypical discharge  Consider laparoscopic BSO with subsequent discontinuation of tamoxifen and consideration of alternative therapies for breast cancer recurrence risk reduction  2. Pelvic ultrasound is ordered 3. Return in 3 months for follow-up and repeat ultrasound 4. Follow-up as needed.  A total of 30 minutes were spent face-to-face with the  patient during the encounter with greater than 50% dealing with counseling and coordination of care.   Brayton Mars, MD   Note: This dictation was prepared with Dragon dictation along with smaller phrase technology. Any transcriptional errors that result from this process are unintentional.

## 2016-03-04 ENCOUNTER — Other Ambulatory Visit: Payer: Managed Care, Other (non HMO)

## 2016-03-10 ENCOUNTER — Other Ambulatory Visit: Payer: Managed Care, Other (non HMO)

## 2016-03-16 ENCOUNTER — Inpatient Hospital Stay: Payer: Managed Care, Other (non HMO) | Attending: Oncology | Admitting: Oncology

## 2016-03-16 VITALS — BP 128/81 | HR 76 | Temp 98.3°F | Resp 16 | Ht 66.54 in | Wt 192.5 lb

## 2016-03-16 DIAGNOSIS — E079 Disorder of thyroid, unspecified: Secondary | ICD-10-CM | POA: Diagnosis not present

## 2016-03-16 DIAGNOSIS — C50911 Malignant neoplasm of unspecified site of right female breast: Secondary | ICD-10-CM | POA: Insufficient documentation

## 2016-03-16 DIAGNOSIS — Z79899 Other long term (current) drug therapy: Secondary | ICD-10-CM | POA: Insufficient documentation

## 2016-03-16 DIAGNOSIS — F17210 Nicotine dependence, cigarettes, uncomplicated: Secondary | ICD-10-CM | POA: Diagnosis not present

## 2016-03-16 DIAGNOSIS — M1990 Unspecified osteoarthritis, unspecified site: Secondary | ICD-10-CM | POA: Insufficient documentation

## 2016-03-16 DIAGNOSIS — F419 Anxiety disorder, unspecified: Secondary | ICD-10-CM | POA: Insufficient documentation

## 2016-03-16 DIAGNOSIS — C50411 Malignant neoplasm of upper-outer quadrant of right female breast: Secondary | ICD-10-CM

## 2016-03-16 DIAGNOSIS — R232 Flushing: Secondary | ICD-10-CM | POA: Diagnosis not present

## 2016-03-16 DIAGNOSIS — Z170 Estrogen receptor positive status [ER+]: Secondary | ICD-10-CM | POA: Insufficient documentation

## 2016-03-16 DIAGNOSIS — Z79810 Long term (current) use of selective estrogen receptor modulators (SERMs): Secondary | ICD-10-CM | POA: Insufficient documentation

## 2016-03-16 DIAGNOSIS — I10 Essential (primary) hypertension: Secondary | ICD-10-CM | POA: Diagnosis not present

## 2016-03-16 NOTE — Progress Notes (Signed)
Patient has completed XRT in 11/2015 and has not been evaluated by medical oncologist.  Her surgeon, Dr. Jamal Collin, prescribed Tamoxifen.  Since taking the Tamoxifen she is having bad hot flashes.

## 2016-03-21 NOTE — Progress Notes (Signed)
[facility]  [phone] [phone]  ID: Michelle Walker OB: 18-Jul-1961  MR#: [id number]  [id number]  Patient Care Team: Casilda Carls, MD as PCP - General (Internal Medicine) Casilda Carls, MD as Consulting Physician (Internal Medicine) Christene Lye, MD (General Surgery)  CHIEF COMPLAINT:  Chief Complaint  Patient presents with  . Breast Cancer    INTERVAL HISTORY: Patient is a 55 year old female who is status post lumpectomy and XRT for a stage Ia lobular breast cancer diagnosed in September 2016.  Despite malignancy, patient has never been evaluated by a medical oncologist. She is having significant hot flashes with tamoxifen and has been referred for further evaluation. She also concerned about developing endometrial cancer while on tamoxifen. She otherwise feels well. She has no neurologic complaints. She denies any recent fevers or illnesses. She has good appetite and denies weight loss. She denies any pain. She denies any chest pain or shortness of breath. She denies any nausea, vomiting, constipation, or diarrhea. She has no urinary complaints. Patient otherwise feels well and offers no further specific complaints.  REVIEW OF SYSTEMS:   Review of Systems  Constitutional: Negative.  Negative for fever, weight loss and malaise/fatigue.  Respiratory: Negative.  Negative for cough and shortness of breath.   Cardiovascular: Negative.  Negative for chest pain.  Gastrointestinal: Negative.   Genitourinary: Negative.   Musculoskeletal: Negative.   Neurological: Positive for sensory change. Negative for weakness.  Psychiatric/Behavioral: The patient is nervous/anxious.     As per HPI. Otherwise, a complete review of systems is negatve.  PAST MEDICAL HISTORY: Past Medical History  Diagnosis Date  . Arthritis   . Anemia   . Hypertension   . Headache   . Cancer ([place])   . Breast cancer ([place]) 08/2015    Radiation + right breast   . Thyroid disease     PAST SURGICAL HISTORY: Past Surgical History  Procedure Laterality Date  . Colonoscopy w/ polypectomy  2014  . Anterior cruciate ligament repair Right   . Novasure ablation    . Breast lumpectomy with sentinel lymph node biopsy Right 08/22/2015    Procedure: BREAST LUMPECTOMY WITH SENTINEL LYMPH NODE BX;  Surgeon: Christene Lye, MD;  Location: [facility] ORS;  Service: General;  Laterality: Right;  . Breast biopsy Right 07/17/2015    INVASIVE LOBULAR CARCINOMA, CLASSIC TYPE (1.5 MM), ARISING IN A   . Dilation and curettage of uterus      20 years ago    FAMILY HISTORY Family History  Problem Relation Age of Onset  . Stroke Mother   . Cancer Sister     sarcoma on arm; chemo  . Diabetes Sister   . Diabetes Brother   . Breast cancer Neg Hx   . Ovarian cancer Neg Hx   . Colon cancer Neg Hx   . Heart disease Neg Hx        ADVANCED DIRECTIVES:    HEALTH MAINTENANCE: Social History  Substance Use Topics  . Smoking status: Current Every Day Smoker -- 0.50 packs/day for 35 years    Types: Cigarettes  . Smokeless tobacco: Never Used  . Alcohol Use: 1.2 oz/week    2 Standard drinks or equivalent per week     Comment: BEER 1-2 QD     Colonoscopy:  PAP:  Bone density:  Lipid panel:  Allergies  Allergen Reactions  . Tape     SURGICAL TAPE AFTER BREAST BIOPSY    Current Outpatient Prescriptions  Medication  Sig Dispense Refill  . allopurinol (ZYLOPRIM) 100 MG tablet Take 100 mg by mouth at bedtime.    [name] amLODipine (NORVASC) 10 MG tablet Take 10 mg by mouth at bedtime.   0  . losartan (COZAAR) 100 MG tablet Take 100 mg by mouth at bedtime.   0  . metoprolol succinate (TOPROL-XL) 25 MG 24 hr tablet Take 25 mg by mouth at bedtime.   0  . tamoxifen (NOLVADEX) 20 MG tablet Take 1 tablet (20 mg total) by mouth daily. 30 tablet 12  . Vitamin D, Ergocalciferol, (DRISDOL) 50000 UNITS CAPS capsule Take 50,000 Units by mouth every 7 (seven) days. On  Thursday  0   No current facility-administered medications for this visit.    OBJECTIVE: Filed Vitals:   03/16/16 1109  BP: 128/81  Pulse: 76  Temp: 98.3 F (36.8 C)  Resp: 16     Body mass index is 30.57 kg/(m^2).    ECOG FS:0 - Asymptomatic  General: Well-developed, well-nourished, no acute distress. Eyes: Pink conjunctiva, anicteric sclera. HEENT: Normocephalic, moist mucous membranes, clear oropharnyx. Breasts: Patient requested exam be deferred today. Lungs: Clear to auscultation bilaterally. Heart: Regular rate and rhythm. No rubs, murmurs, or gallops. Abdomen: Soft, nontender, nondistended. No organomegaly noted, normoactive bowel sounds. Musculoskeletal: No edema, cyanosis, or clubbing. Neuro: Alert, answering all questions appropriately. Cranial nerves grossly intact. Skin: No rashes or petechiae noted. Psych: Normal affect. Lymphatics: No cervical, calvicular, axillary or inguinal LAD.   LAB RESULTS:  Lab Results  Component Value Date   NA 137 08/05/2015   K 3.8 08/05/2015   CL 111 08/05/2015   CO2 23 08/05/2015   GLUCOSE 100* 08/05/2015   BUN 14 08/05/2015   CREATININE 0.77 08/05/2015   CALCIUM 9.0 08/05/2015   PROT 6.8 08/05/2015   ALBUMIN 3.5 08/05/2015   AST 23 08/05/2015   ALT 12* 08/05/2015   ALKPHOS 61 08/05/2015   BILITOT 0.5 08/05/2015   GFRNONAA >60 08/05/2015   GFRAA >60 08/05/2015    Lab Results  Component Value Date   WBC 4.6 10/08/2015   NEUTROABS 4.0 08/05/2015   HGB 13.7 10/08/2015   HCT 42.2 10/08/2015   MCV 82.3 10/08/2015   PLT 176 10/08/2015     STUDIES: No results found.  ASSESSMENT: Stage Ia ER/PR positive invasive lobular carcinoma of the right breast.  PLAN:    1. Breast cancer: Patient had her lumpectomy in October 2016. She completed XRT in approximately January 2017. She was initiated on tamoxifen in February 2017 and has had poor toleration of this medication. She is also concerned about the risk of endometrial  cancer stating she has had an endometrial ablation procedure in the past. Patient has seen gynecology and continues to have routine monitoring and checkup with them. She also is unsure if she is perimenopausal. After lengthy discussion with the patient, she will hold tamoxifen to see if she feels improved and her symptoms resolve. She will return to clinic in 3 months for further evaluation and additional discussion of reinitiating either tamoxifen or an aromatase inhibitor.  Approximately 45 minutes was spent in discussion of which greater than 50% was consultation.  Patient expressed understanding and was in agreement with this plan. She also understands that She can call clinic at any time with any questions, concerns, or complaints.    Lloyd Huger, MD   03/21/2016 8:54 AM

## 2016-06-01 ENCOUNTER — Ambulatory Visit: Payer: Managed Care, Other (non HMO) | Admitting: Obstetrics and Gynecology

## 2016-06-02 IMAGING — MG MM BREAST SURGICAL SPECIMEN
1 series · 1 of 1 positions shown · non-contrast
Comparison: Previous exam(s).

CLINICAL DATA: Specimen radiograph status post lumpectomy of the
right breast.

EXAM:
SPECIMEN RADIOGRAPH OF THE RIGHT BREAST

[R SPECIMEN]
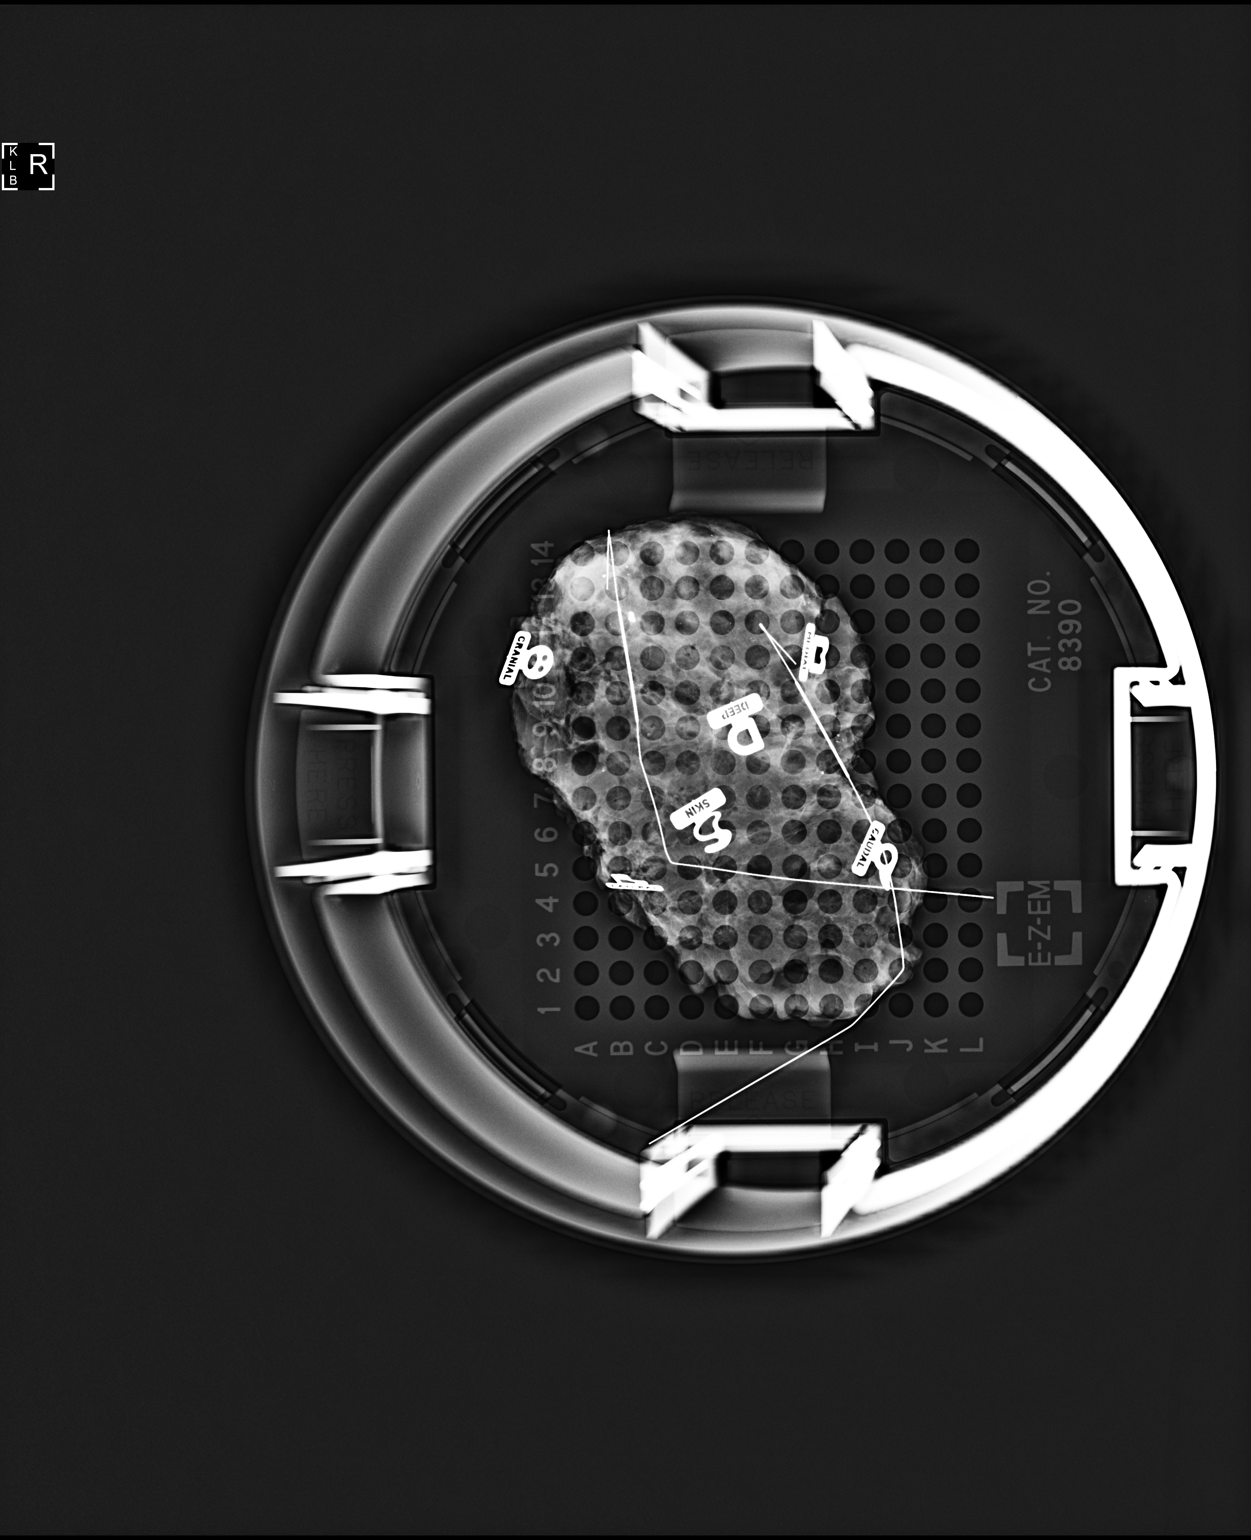

[1 of 1 positions shown; findings below may reference images not displayed]

FINDINGS: Status post excision of the right breast. The 2 wire tips, biopsy
marker clip and calcifications are present within the specimen.
IMPRESSION: Specimen radiograph of the right breast.

## 2016-06-08 ENCOUNTER — Ambulatory Visit: Payer: Managed Care, Other (non HMO) | Admitting: Obstetrics and Gynecology

## 2016-06-15 ENCOUNTER — Ambulatory Visit: Payer: Managed Care, Other (non HMO) | Admitting: Obstetrics and Gynecology

## 2016-06-15 NOTE — Progress Notes (Signed)
[place]  [phone] Fax:(336) [phone]  ID: Michelle Walker OB: 08-21-1961  MR#: [id number]  [id number]  Patient Care Team: Casilda Carls, MD as PCP - General (Internal Medicine) Casilda Carls, MD as Consulting Physician (Internal Medicine) Christene Lye, MD (General Surgery)  CHIEF COMPLAINT: Stage Ia ER/PR positive invasive lobular carcinoma of the upper outer quadrant of the right breast.  INTERVAL HISTORY: Patient returns to clinic today for routine 3 month valuation. She has been off tamoxifen since her last clinic appointment and feels back to her baseline. She is no longer having hot flashes. She currently feels well and is asymptomatic. She has no neurologic complaints. She denies any recent fevers or illnesses. She has good appetite and denies weight loss. She denies any pain. She denies any chest pain or shortness of breath. She denies any nausea, vomiting, constipation, or diarrhea. She has no urinary complaints. Patient offers no specific complaints today.  REVIEW OF SYSTEMS:   Review of Systems  Constitutional: Negative.  Negative for fever, malaise/fatigue and weight loss.  Respiratory: Negative.  Negative for cough and shortness of breath.   Cardiovascular: Negative.  Negative for chest pain.  Gastrointestinal: Negative.   Genitourinary: Negative.   Musculoskeletal: Negative.   Neurological: Negative for sensory change and weakness.  Psychiatric/Behavioral: The patient is not nervous/anxious.     As per HPI. Otherwise, a complete review of systems is negatve.  PAST MEDICAL HISTORY: Past Medical History:  Diagnosis Date  . Anemia   . Arthritis   . Breast cancer ([place]) 08/2015   Radiation + right breast  . Cancer ([place])   . Headache   . Hypertension   . Thyroid disease     PAST SURGICAL HISTORY: Past Surgical History:  Procedure Laterality Date  . ANTERIOR CRUCIATE LIGAMENT REPAIR Right   . BREAST BIOPSY Right  07/17/2015   INVASIVE LOBULAR CARCINOMA, CLASSIC TYPE (1.5 MM), ARISING IN A   . BREAST LUMPECTOMY WITH SENTINEL LYMPH NODE BIOPSY Right 08/22/2015   Procedure: BREAST LUMPECTOMY WITH SENTINEL LYMPH NODE BX;  Surgeon: Christene Lye, MD;  Location: [facility] ORS;  Service: General;  Laterality: Right;  . COLONOSCOPY W/ POLYPECTOMY  2014  . DILATION AND CURETTAGE OF UTERUS     20 years ago  . NOVASURE ABLATION      FAMILY HISTORY Family History  Problem Relation Age of Onset  . Stroke Mother   . Cancer Sister     sarcoma on arm; chemo  . Diabetes Sister   . Diabetes Brother   . Breast cancer Neg Hx   . Ovarian cancer Neg Hx   . Colon cancer Neg Hx   . Heart disease Neg Hx        ADVANCED DIRECTIVES:    HEALTH MAINTENANCE: Social History  Substance Use Topics  . Smoking status: Current Every Day Smoker    Packs/day: 0.50    Years: 35.00    Types: Cigarettes  . Smokeless tobacco: Never Used  . Alcohol use 1.2 oz/week    2 Standard drinks or equivalent per week     Comment: BEER 1-2 QD     Colonoscopy:  PAP:  Bone density:  Lipid panel:  Allergies  Allergen Reactions  . Tape     SURGICAL TAPE AFTER BREAST BIOPSY    Current Outpatient Prescriptions  Medication Sig Dispense Refill  . allopurinol (ZYLOPRIM) 100 MG tablet Take 100 mg by mouth at bedtime.    [name] amLODipine (NORVASC) 10 MG  tablet Take 10 mg by mouth at bedtime.   0  . losartan (COZAAR) 100 MG tablet Take 100 mg by mouth at bedtime.   0  . metoprolol succinate (TOPROL-XL) 25 MG 24 hr tablet Take 25 mg by mouth at bedtime.   0  . tamoxifen (NOLVADEX) 20 MG tablet Take 1 tablet (20 mg total) by mouth daily. (Patient not taking: Reported on 06/16/2016) 30 tablet 12   No current facility-administered medications for this visit.     OBJECTIVE: Vitals:   06/16/16 1043  BP: (!) 146/89  Pulse: 90  Temp: 97.2 F (36.2 C)     Body mass index is 31.27 kg/m.    ECOG FS:0 - Asymptomatic  General:  Well-developed, well-nourished, no acute distress. Eyes: Pink conjunctiva, anicteric sclera. Breasts: Patient requested exam be deferred today. Lungs: Clear to auscultation bilaterally. Heart: Regular rate and rhythm. No rubs, murmurs, or gallops. Abdomen: Soft, nontender, nondistended. No organomegaly noted, normoactive bowel sounds. Musculoskeletal: No edema, cyanosis, or clubbing. Neuro: Alert, answering all questions appropriately. Cranial nerves grossly intact. Skin: No rashes or petechiae noted. Psych: Normal affect.   LAB RESULTS:  Lab Results  Component Value Date   NA 137 08/05/2015   K 3.8 08/05/2015   CL 111 08/05/2015   CO2 23 08/05/2015   GLUCOSE 100 (H) 08/05/2015   BUN 14 08/05/2015   CREATININE 0.77 08/05/2015   CALCIUM 9.0 08/05/2015   PROT 6.8 08/05/2015   ALBUMIN 3.5 08/05/2015   AST 23 08/05/2015   ALT 12 (L) 08/05/2015   ALKPHOS 61 08/05/2015   BILITOT 0.5 08/05/2015   GFRNONAA >60 08/05/2015   GFRAA >60 08/05/2015    Lab Results  Component Value Date   WBC 4.6 10/08/2015   NEUTROABS 4.0 08/05/2015   HGB 13.7 10/08/2015   HCT 42.2 10/08/2015   MCV 82.3 10/08/2015   PLT 176 10/08/2015     STUDIES: No results found.  ASSESSMENT: Stage Ia ER/PR positive invasive lobular carcinoma of the upper outer quadrant of the right breast.  PLAN:    1. Stage Ia ER/PR positive invasive lobular carcinoma of the upper outer quadrant of the right breast: Patient had her lumpectomy in October 2016. She completed XRT in approximately January 2017. She was initiated on tamoxifen in February 2017 and has had poor toleration of this medication. Patient's symptoms resolved after discontinue tamoxifen. After lengthy discussion, she does not wish to reinitiate treatment or attempt another treatment such as letrozole. Patient expressed understanding that by not taking adjuvant hormonal therapy this increases her risk of recurrence. No further intervention is needed at  this time. Patient's most recent mammogram on February 03, 2016 was reported as BI-RADS 2, repeat in April 2018.  Return to clinic in 6 months with repeat laboratory work and further evaluation.   Patient expressed understanding and was in agreement with this plan. She also understands that She can call clinic at any time with any questions, concerns, or complaints.    Lloyd Huger, MD   06/16/2016 11:19 AM

## 2016-06-16 ENCOUNTER — Inpatient Hospital Stay: Payer: Managed Care, Other (non HMO) | Attending: Oncology | Admitting: Oncology

## 2016-06-16 ENCOUNTER — Encounter (INDEPENDENT_AMBULATORY_CARE_PROVIDER_SITE_OTHER): Payer: Self-pay

## 2016-06-16 VITALS — BP 146/89 | HR 90 | Temp 97.2°F | Wt 196.9 lb

## 2016-06-16 DIAGNOSIS — Z79899 Other long term (current) drug therapy: Secondary | ICD-10-CM | POA: Diagnosis not present

## 2016-06-16 DIAGNOSIS — Z923 Personal history of irradiation: Secondary | ICD-10-CM | POA: Diagnosis not present

## 2016-06-16 DIAGNOSIS — D649 Anemia, unspecified: Secondary | ICD-10-CM | POA: Insufficient documentation

## 2016-06-16 DIAGNOSIS — I10 Essential (primary) hypertension: Secondary | ICD-10-CM | POA: Insufficient documentation

## 2016-06-16 DIAGNOSIS — M129 Arthropathy, unspecified: Secondary | ICD-10-CM | POA: Diagnosis not present

## 2016-06-16 DIAGNOSIS — Z8669 Personal history of other diseases of the nervous system and sense organs: Secondary | ICD-10-CM | POA: Insufficient documentation

## 2016-06-16 DIAGNOSIS — Z808 Family history of malignant neoplasm of other organs or systems: Secondary | ICD-10-CM | POA: Diagnosis not present

## 2016-06-16 DIAGNOSIS — Z853 Personal history of malignant neoplasm of breast: Secondary | ICD-10-CM | POA: Diagnosis not present

## 2016-06-16 DIAGNOSIS — E079 Disorder of thyroid, unspecified: Secondary | ICD-10-CM | POA: Diagnosis not present

## 2016-06-16 DIAGNOSIS — F17210 Nicotine dependence, cigarettes, uncomplicated: Secondary | ICD-10-CM

## 2016-06-16 DIAGNOSIS — C50411 Malignant neoplasm of upper-outer quadrant of right female breast: Secondary | ICD-10-CM

## 2016-06-16 NOTE — Progress Notes (Signed)
Patient ambulates without assistance, brought to exam room 9.  Patient denies pain and discomfort at this time.  Vitals: BP 146/89 HR 90, vitals documented. Medication record updated information provided by patient

## 2016-07-19 ENCOUNTER — Other Ambulatory Visit: Payer: Self-pay | Admitting: General Surgery

## 2016-07-19 DIAGNOSIS — C50411 Malignant neoplasm of upper-outer quadrant of right female breast: Secondary | ICD-10-CM

## 2016-07-26 ENCOUNTER — Encounter: Payer: Self-pay | Admitting: *Deleted

## 2016-08-19 ENCOUNTER — Other Ambulatory Visit: Payer: Managed Care, Other (non HMO)

## 2016-08-19 ENCOUNTER — Ambulatory Visit: Payer: Managed Care, Other (non HMO)

## 2016-08-31 ENCOUNTER — Ambulatory Visit: Payer: Managed Care, Other (non HMO) | Admitting: General Surgery

## 2016-09-02 ENCOUNTER — Ambulatory Visit
Admission: RE | Admit: 2016-09-02 | Discharge: 2016-09-02 | Disposition: A | Discharge status: Home / Self Care | Payer: Managed Care, Other (non HMO) | Source: Ambulatory Visit | Attending: General Surgery | Admitting: General Surgery

## 2016-09-02 ENCOUNTER — Other Ambulatory Visit: Payer: Self-pay | Admitting: General Surgery

## 2016-09-02 DIAGNOSIS — C50411 Malignant neoplasm of upper-outer quadrant of right female breast: Secondary | ICD-10-CM | POA: Diagnosis present

## 2016-09-02 HISTORY — DX: Personal history of irradiation: Z92.3

## 2016-09-06 ENCOUNTER — Encounter: Payer: Self-pay | Admitting: *Deleted

## 2016-09-08 ENCOUNTER — Encounter: Payer: Self-pay | Admitting: General Surgery

## 2016-09-08 ENCOUNTER — Ambulatory Visit (INDEPENDENT_AMBULATORY_CARE_PROVIDER_SITE_OTHER): Payer: Managed Care, Other (non HMO) | Admitting: General Surgery

## 2016-09-08 VITALS — BP 128/74 | HR 76 | Resp 12 | Ht 66.0 in | Wt 196.0 lb

## 2016-09-08 DIAGNOSIS — C50911 Malignant neoplasm of unspecified site of right female breast: Secondary | ICD-10-CM

## 2016-09-08 NOTE — Progress Notes (Signed)
Patient ID: Michelle Walker, female   DOB: 04/22/1961, 55 y.o.   MRN: [id number]  Chief Complaint  Patient presents with  . Follow-up    mammogram    HPI Michelle Walker is a 55 y.o. female who presents for her 6 month follow up of right breast cancer. History of right breast lumpectomy and radiation. The most recent mammogram was done on 09-02-16. Patient does perform regular self breast checks and gets regular mammograms done.   She stopped taking Tamoxifen about 1.5 months ago because she "felt uneasy taking it" and did not like how it affected her body, including symptoms such as hot flashes and mood swings. She states she is going through menopause. I have reviewed the history of present illness with the patient.   HPI  Past Medical History:  Diagnosis Date  . Anemia   . Arthritis   . Breast cancer ([place]) 08/2015   Radiation + right breast  . Cancer ([place])   . Headache   . Hypertension   . Personal history of radiation therapy   . Thyroid disease     Past Surgical History:  Procedure Laterality Date  . ANTERIOR CRUCIATE LIGAMENT REPAIR Right   . BREAST BIOPSY Right 07/17/2015   INVASIVE LOBULAR CARCINOMA, CLASSIC TYPE (1.5 MM), ARISING IN A   . BREAST LUMPECTOMY WITH SENTINEL LYMPH NODE BIOPSY Right 08/22/2015   Procedure: BREAST LUMPECTOMY WITH SENTINEL LYMPH NODE BX;  Surgeon: Michelle Lye, MD;  Location: [facility] ORS;  Service: General;  Laterality: Right;  . COLONOSCOPY W/ POLYPECTOMY  2014  . DILATION AND CURETTAGE OF UTERUS     20 years ago  . NOVASURE ABLATION      Family History  Problem Relation Age of Onset  . Stroke Mother   . Cancer Sister     sarcoma on arm; chemo  . Diabetes Sister   . Diabetes Brother   . Breast cancer Neg Hx   . Ovarian cancer Neg Hx   . Colon cancer Neg Hx   . Heart disease Neg Hx     Social History Social History  Substance Use Topics  . Smoking status: Current Every Day Smoker    Packs/day: 0.50    Years: 35.00     Types: Cigarettes  . Smokeless tobacco: Never Used  . Alcohol use 1.2 oz/week    2 Standard drinks or equivalent per week     Comment: BEER 1-2 QD    Allergies  Allergen Reactions  . Tape     SURGICAL TAPE AFTER BREAST BIOPSY    Current Outpatient Prescriptions  Medication Sig Dispense Refill  . allopurinol (ZYLOPRIM) 100 MG tablet Take 100 mg by mouth at bedtime.    [name] amLODipine (NORVASC) 10 MG tablet Take 10 mg by mouth at bedtime.   0  . losartan (COZAAR) 100 MG tablet Take 100 mg by mouth at bedtime.   0  . metoprolol succinate (TOPROL-XL) 25 MG 24 hr tablet Take 25 mg by mouth at bedtime.   0  . Vitamin D, Ergocalciferol, (DRISDOL) 50000 units CAPS capsule Take 50,000 Units by mouth every 7 (seven) days.      No current facility-administered medications for this visit.     Review of Systems Review of Systems  Constitutional: Negative.   Respiratory: Negative.   Cardiovascular: Negative.     Blood pressure 128/74, pulse 76, resp. rate 12, height 5\' 6"  (1.676 m), weight 196 lb (88.9 kg).  Physical Exam Physical Exam  Constitutional: She is oriented to person, place, and time. She appears well-developed and well-nourished.  HENT:  Mouth/Throat: Oropharynx is clear and moist.  Eyes: Conjunctivae are normal. No scleral icterus.  Neck: Neck supple.  Cardiovascular: Normal rate, regular rhythm and normal heart sounds.   Pulmonary/Chest: Effort normal and breath sounds normal. Right breast exhibits skin change. Right breast exhibits no inverted nipple, no mass, no nipple discharge and no tenderness. Left breast exhibits no inverted nipple, no mass, no nipple discharge, no skin change and no tenderness.  Right breast skin changes due to radiation   Abdominal: Soft. Normal appearance. There is no hepatomegaly. There is no tenderness.  Lymphadenopathy:    She has no cervical adenopathy.    She has no axillary adenopathy.  Neurological: She is alert and oriented to person,  place, and time.  Skin: Skin is warm and dry.  Psychiatric: Her behavior is normal.    Data Reviewed Mammogram reviewed.  Assessment    1 year post right breast lumpectomy, invasive lobular breast cancer, T1,N0. ER/PR pos, Her 2 neg Completed radiation but has stopped taking Tamoxifen    Plan     Advised to discuss use of alternative medication with Dr. Grayland Ormond, as she is no longer taking Tamoxifen. Explained the risks associated with discontinuation of medication. The patient has been asked to return to the office in one year with a bilateral diagnostic mammogram.      This information has been scribed by Michelle Fetch RN, BSN,BC.   Michelle Walker G 09/08/2016, 11:52 AM

## 2016-09-08 NOTE — Patient Instructions (Signed)
The patient is aware to call back for any questions or concerns.  

## 2016-12-19 NOTE — Progress Notes (Signed)
[place]  Telephone:(336) [phone] Fax:(336) [phone]  ID: Michelle Walker OB: 10/29/1961  MR#: [id number]  [id number]  Patient Care Team: Casilda Carls as PCP - General (Internal Medicine) Casilda Carls as Consulting Physician (Internal Medicine) Christene Lye, MD (General Surgery)  CHIEF COMPLAINT: Stage Ia ER/PR positive invasive lobular carcinoma of the upper outer quadrant of the right breast.  INTERVAL HISTORY: Patient returns to clinic today for routine 3 month valuation. She no longer is taking hormonal treatment and feels back to her baseline. She is no longer having hot flashes. She currently feels well and is asymptomatic. She has no neurologic complaints. She denies any recent fevers or illnesses. She has a good appetite and denies weight loss. She denies any pain. She denies any chest pain or shortness of breath. She denies any nausea, vomiting, constipation, or diarrhea. She has no urinary complaints. Patient offers no specific complaints today.  REVIEW OF SYSTEMS:   Review of Systems  Constitutional: Negative.  Negative for fever, malaise/fatigue and weight loss.  Respiratory: Negative.  Negative for cough and shortness of breath.   Cardiovascular: Negative.  Negative for chest pain and leg swelling.  Gastrointestinal: Negative.  Negative for abdominal pain.  Genitourinary: Negative.   Musculoskeletal: Negative.   Neurological: Negative.  Negative for sensory change and weakness.  Psychiatric/Behavioral: The patient is not nervous/anxious.     As per HPI. Otherwise, a complete review of systems is negative.  PAST MEDICAL HISTORY: Past Medical History:  Diagnosis Date  . Anemia   . Arthritis   . Breast cancer ([place]) 08/2015   Radiation + right breast  . Headache   . Hypertension   . Personal history of radiation therapy   . Thyroid disease     PAST SURGICAL HISTORY: Past Surgical History:  Procedure Laterality Date  .  ANTERIOR CRUCIATE LIGAMENT REPAIR Right   . BREAST BIOPSY Right 07/17/2015   INVASIVE LOBULAR CARCINOMA, CLASSIC TYPE (1.5 MM), ARISING IN A   . BREAST LUMPECTOMY WITH SENTINEL LYMPH NODE BIOPSY Right 08/22/2015   Procedure: BREAST LUMPECTOMY WITH SENTINEL LYMPH NODE BX;  Surgeon: Christene Lye, MD;  Location: [facility] ORS;  Service: General;  Laterality: Right;  . COLONOSCOPY W/ POLYPECTOMY  2014  . DILATION AND CURETTAGE OF UTERUS     20 years ago  . NOVASURE ABLATION      FAMILY HISTORY Family History  Problem Relation Age of Onset  . Stroke Mother   . Cancer Sister     sarcoma on arm; chemo  . Diabetes Sister   . Stroke Sister   . Diabetes Brother   . Breast cancer Neg Hx   . Ovarian cancer Neg Hx   . Colon cancer Neg Hx   . Heart disease Neg Hx        ADVANCED DIRECTIVES:    HEALTH MAINTENANCE: Social History  Substance Use Topics  . Smoking status: Current Every Day Smoker    Packs/day: 0.50    Years: 35.00    Types: Cigarettes  . Smokeless tobacco: Never Used  . Alcohol use 1.2 oz/week    2 Standard drinks or equivalent per week     Comment: BEER 1-2 QD     Colonoscopy:  PAP:  Bone density:  Lipid panel:  Allergies  Allergen Reactions  . Tape     SURGICAL TAPE AFTER BREAST BIOPSY    Current Outpatient Prescriptions  Medication Sig Dispense Refill  . allopurinol (ZYLOPRIM) 100 MG tablet Take 100  mg by mouth at bedtime.    [name] amLODipine (NORVASC) 10 MG tablet Take 10 mg by mouth at bedtime.   0  . losartan (COZAAR) 100 MG tablet Take 100 mg by mouth at bedtime.   0  . metoprolol succinate (TOPROL-XL) 25 MG 24 hr tablet Take 25 mg by mouth at bedtime.   0  . Vitamin D, Ergocalciferol, (DRISDOL) 50000 units CAPS capsule Take 50,000 Units by mouth every 7 (seven) days.      No current facility-administered medications for this visit.     OBJECTIVE: Vitals:   12/20/16 1025  BP: (!) 160/94  Pulse: 93  Resp: 18  Temp: 98.3 F (36.8 C)     Body  mass index is 31.35 kg/m.    ECOG FS:0 - Asymptomatic  General: Well-developed, well-nourished, no acute distress. Eyes: Pink conjunctiva, anicteric sclera. Breasts: Patient requested exam be deferred today. Lungs: Clear to auscultation bilaterally. Heart: Regular rate and rhythm. No rubs, murmurs, or gallops. Abdomen: Soft, nontender, nondistended. No organomegaly noted, normoactive bowel sounds. Musculoskeletal: No edema, cyanosis, or clubbing. Neuro: Alert, answering all questions appropriately. Cranial nerves grossly intact. Skin: No rashes or petechiae noted. Psych: Normal affect.   LAB RESULTS:  Lab Results  Component Value Date   NA 137 08/05/2015   K 3.8 08/05/2015   CL 111 08/05/2015   CO2 23 08/05/2015   GLUCOSE 100 (H) 08/05/2015   BUN 14 08/05/2015   CREATININE 0.77 08/05/2015   CALCIUM 9.0 08/05/2015   PROT 6.8 08/05/2015   ALBUMIN 3.5 08/05/2015   AST 23 08/05/2015   ALT 12 (L) 08/05/2015   ALKPHOS 61 08/05/2015   BILITOT 0.5 08/05/2015   GFRNONAA >60 08/05/2015   GFRAA >60 08/05/2015    Lab Results  Component Value Date   WBC 4.6 10/08/2015   NEUTROABS 4.0 08/05/2015   HGB 13.7 10/08/2015   HCT 42.2 10/08/2015   MCV 82.3 10/08/2015   PLT 176 10/08/2015     STUDIES: No results found.  ASSESSMENT: Stage Ia ER/PR positive invasive lobular carcinoma of the upper outer quadrant of the right breast.  PLAN:    1. Stage Ia ER/PR positive invasive lobular carcinoma of the upper outer quadrant of the right breast: Patient had her lumpectomy in October 2016. She completed XRT in approximately January 2017. She was initiated on tamoxifen in February 2017 and has had poor toleration of this medication. Patient's symptoms resolved after discontinue tamoxifen. After lengthy discussion, she did not wish to reinitiate treatment or attempt another treatment such as letrozole. Patient expressed understanding that by not taking adjuvant hormonal therapy this  increases her risk of recurrence. No further intervention is needed at this time. Patient's most recent mammogram on February 03, 2016 was reported as BI-RADS 2, repeat in April 2018.  Return to clinic in 6 months with repeat laboratory work and further evaluation. At that point, can consider discharging patient from clinic and having her primary care physician continue to monitor mammograms.  Approximately 20 minutes was spent in discussion of which greater than 50% was consultation.  Patient expressed understanding and was in agreement with this plan. She also understands that She can call clinic at any time with any questions, concerns, or complaints.    Lloyd Huger, MD   12/21/2016 4:18 PM

## 2016-12-20 ENCOUNTER — Inpatient Hospital Stay: Payer: Managed Care, Other (non HMO) | Attending: Oncology | Admitting: Oncology

## 2016-12-20 ENCOUNTER — Encounter: Payer: Self-pay | Admitting: Oncology

## 2016-12-20 ENCOUNTER — Encounter (INDEPENDENT_AMBULATORY_CARE_PROVIDER_SITE_OTHER): Payer: Self-pay

## 2016-12-20 VITALS — BP 160/94 | HR 93 | Temp 98.3°F | Resp 18 | Ht 66.0 in | Wt 194.2 lb

## 2016-12-20 DIAGNOSIS — F17210 Nicotine dependence, cigarettes, uncomplicated: Secondary | ICD-10-CM | POA: Diagnosis not present

## 2016-12-20 DIAGNOSIS — M129 Arthropathy, unspecified: Secondary | ICD-10-CM | POA: Diagnosis not present

## 2016-12-20 DIAGNOSIS — E079 Disorder of thyroid, unspecified: Secondary | ICD-10-CM | POA: Diagnosis not present

## 2016-12-20 DIAGNOSIS — I10 Essential (primary) hypertension: Secondary | ICD-10-CM | POA: Diagnosis not present

## 2016-12-20 DIAGNOSIS — Z853 Personal history of malignant neoplasm of breast: Secondary | ICD-10-CM | POA: Insufficient documentation

## 2016-12-20 DIAGNOSIS — C50411 Malignant neoplasm of upper-outer quadrant of right female breast: Secondary | ICD-10-CM

## 2016-12-20 DIAGNOSIS — Z923 Personal history of irradiation: Secondary | ICD-10-CM

## 2016-12-20 DIAGNOSIS — D649 Anemia, unspecified: Secondary | ICD-10-CM | POA: Insufficient documentation

## 2016-12-20 DIAGNOSIS — Z170 Estrogen receptor positive status [ER+]: Secondary | ICD-10-CM

## 2016-12-20 DIAGNOSIS — Z809 Family history of malignant neoplasm, unspecified: Secondary | ICD-10-CM | POA: Diagnosis not present

## 2016-12-20 DIAGNOSIS — Z79899 Other long term (current) drug therapy: Secondary | ICD-10-CM | POA: Diagnosis not present

## 2016-12-20 NOTE — Progress Notes (Signed)
No concerns. 

## 2017-02-02 ENCOUNTER — Ambulatory Visit: Payer: Managed Care, Other (non HMO) | Admitting: General Surgery

## 2017-06-20 ENCOUNTER — Inpatient Hospital Stay: Payer: Managed Care, Other (non HMO) | Attending: Oncology | Admitting: Oncology

## 2017-06-20 ENCOUNTER — Encounter: Payer: Self-pay | Admitting: Oncology

## 2017-06-20 ENCOUNTER — Inpatient Hospital Stay: Payer: Managed Care, Other (non HMO)

## 2017-06-20 VITALS — BP 165/98 | HR 80 | Temp 96.7°F | Resp 18 | Ht 66.0 in | Wt 198.6 lb

## 2017-06-20 DIAGNOSIS — Z170 Estrogen receptor positive status [ER+]: Principal | ICD-10-CM

## 2017-06-20 DIAGNOSIS — Z79899 Other long term (current) drug therapy: Secondary | ICD-10-CM

## 2017-06-20 DIAGNOSIS — C50411 Malignant neoplasm of upper-outer quadrant of right female breast: Secondary | ICD-10-CM | POA: Insufficient documentation

## 2017-06-20 DIAGNOSIS — E079 Disorder of thyroid, unspecified: Secondary | ICD-10-CM | POA: Diagnosis not present

## 2017-06-20 DIAGNOSIS — Z808 Family history of malignant neoplasm of other organs or systems: Secondary | ICD-10-CM | POA: Diagnosis not present

## 2017-06-20 DIAGNOSIS — Z923 Personal history of irradiation: Secondary | ICD-10-CM | POA: Diagnosis not present

## 2017-06-20 DIAGNOSIS — F17210 Nicotine dependence, cigarettes, uncomplicated: Secondary | ICD-10-CM

## 2017-06-20 DIAGNOSIS — I10 Essential (primary) hypertension: Secondary | ICD-10-CM | POA: Diagnosis not present

## 2017-06-20 DIAGNOSIS — D649 Anemia, unspecified: Secondary | ICD-10-CM | POA: Diagnosis not present

## 2017-06-20 NOTE — Progress Notes (Signed)
[place]  [phone] [phone]) [phone]  ID: Michelle Walker OB: 04-04-1961  MR#: [id number]  AOZ#:[id number]  Patient Care Team: Casilda Carls, MD as PCP - General (Internal Medicine) Casilda Carls, MD as Consulting Physician (Internal Medicine) Christene Lye, MD (General Surgery)  CHIEF COMPLAINT: Stage Ia ER/PR positive invasive lobular carcinoma of the upper outer quadrant of the right breast.  INTERVAL HISTORY: Patient returns to clinic today for routine 3 month valuation. She is no longer on hormonal treatment because she was having severe hot flashes and body aches. She also states that she researched Tamoxifen and the riskd did not outweigh the benefits. She was on Tamoxifen for 5 months. A few weeks ago she developed a cyst on her right breast, below her nipple that she was able to drain with "white pus". She called Sr. Jamal Collin to see if she should be seen and he stated she did not need to be. Otherwise, she feels well and offers no complaints. She has no neurologic complaints. She denies any recent fevers or illnesses. She has a good appetite and denies weight loss. She denies any pain. She denies any chest pain or shortness of breath. She denies any nausea, vomiting, constipation, or diarrhea. She has no urinary complaints. Patient offers no specific complaints today.  REVIEW OF SYSTEMS:   Review of Systems  Constitutional: Negative.  Negative for fever, malaise/fatigue and weight loss.  Respiratory: Negative.  Negative for cough and shortness of breath.   Cardiovascular: Negative.  Negative for chest pain and leg swelling.  Gastrointestinal: Negative.  Negative for abdominal pain.  Genitourinary: Negative.   Musculoskeletal: Negative.   Skin: Negative for itching and rash.       Right breast cyst below nipple: Resolved about 2 weeks ago  Neurological: Negative.  Negative for sensory change and weakness.  Psychiatric/Behavioral: The  patient is not nervous/anxious.     As per HPI. Otherwise, a complete review of systems is negative.  PAST MEDICAL HISTORY: Past Medical History:  Diagnosis Date  . Anemia   . Arthritis   . Breast cancer ([place]) 08/2015   Radiation + right breast  . Headache   . Hypertension   . Personal history of radiation therapy   . Thyroid disease     PAST SURGICAL HISTORY: Past Surgical History:  Procedure Laterality Date  . ANTERIOR CRUCIATE LIGAMENT REPAIR Right   . BREAST BIOPSY Right 07/17/2015   INVASIVE LOBULAR CARCINOMA, CLASSIC TYPE (1.5 MM), ARISING IN A   . BREAST LUMPECTOMY WITH SENTINEL LYMPH NODE BIOPSY Right 08/22/2015   Procedure: BREAST LUMPECTOMY WITH SENTINEL LYMPH NODE BX;  Surgeon: Christene Lye, MD;  Location: [facility] ORS;  Service: General;  Laterality: Right;  . COLONOSCOPY W/ POLYPECTOMY  2014  . DILATION AND CURETTAGE OF UTERUS     20 years ago  . NOVASURE ABLATION      FAMILY HISTORY Family History  Problem Relation Age of Onset  . Stroke Mother   . Cancer Sister        sarcoma on arm; chemo  . Diabetes Sister   . Stroke Sister   . Diabetes Brother   . Breast cancer Neg Hx   . Ovarian cancer Neg Hx   . Colon cancer Neg Hx   . Heart disease Neg Hx        ADVANCED DIRECTIVES:    HEALTH MAINTENANCE: Social History  Substance Use Topics  . Smoking status: Current Every Day Smoker  Packs/day: 0.50    Years: 35.00    Types: Cigarettes  . Smokeless tobacco: Never Used  . Alcohol use 1.2 oz/week    2 Standard drinks or equivalent per week     Comment: BEER 1-2 QD     Colonoscopy:  PAP:  Bone density:  Lipid panel:  Allergies  Allergen Reactions  . Tape     SURGICAL TAPE AFTER BREAST BIOPSY    Current Outpatient Prescriptions  Medication Sig Dispense Refill  . allopurinol (ZYLOPRIM) 100 MG tablet Take 100 mg by mouth at bedtime.    [name] amLODipine (NORVASC) 10 MG tablet Take 10 mg by mouth at bedtime.   0  . losartan (COZAAR) 100  MG tablet Take 100 mg by mouth at bedtime.   0  . metoprolol succinate (TOPROL-XL) 25 MG 24 hr tablet Take 25 mg by mouth at bedtime.   0  . Vitamin D, Ergocalciferol, (DRISDOL) 50000 units CAPS capsule Take 50,000 Units by mouth every 7 (seven) days.      No current facility-administered medications for this visit.     OBJECTIVE: Vitals:   06/20/17 1023  BP: (!) 165/98  Pulse: 80  Resp: 18  Temp: (!) 96.7 F (35.9 C)     Body mass index is 32.05 kg/m.    ECOG FS:0 - Asymptomatic  General: Well-developed, well-nourished, no acute distress. Eyes: Pink conjunctiva, anicteric sclera. Breasts: Breast exam unremarkable. Right breast small scar from recent cyst. Scar from lumpectomy with some scar tissue noted on right breast at 8 o'clock to left of nipple. Next mammogram in November 2018. Lungs: Clear to auscultation bilaterally. Heart: Regular rate and rhythm. No rubs, murmurs, or gallops. Abdomen: Soft, nontender, nondistended. No organomegaly noted, normoactive bowel sounds. Musculoskeletal: No edema, cyanosis, or clubbing. Neuro: Alert, answering all questions appropriately. Cranial nerves grossly intact. Skin: No rashes or petechiae noted. Psych: Normal affect.   LAB RESULTS:  Lab Results  Component Value Date   NA 137 08/05/2015   K 3.8 08/05/2015   CL 111 08/05/2015   CO2 23 08/05/2015   GLUCOSE 100 (H) 08/05/2015   BUN 14 08/05/2015   CREATININE 0.77 08/05/2015   CALCIUM 9.0 08/05/2015   PROT 6.8 08/05/2015   ALBUMIN 3.5 08/05/2015   AST 23 08/05/2015   ALT 12 (L) 08/05/2015   ALKPHOS 61 08/05/2015   BILITOT 0.5 08/05/2015   GFRNONAA >60 08/05/2015   GFRAA >60 08/05/2015    Lab Results  Component Value Date   WBC 4.6 10/08/2015   NEUTROABS 4.0 08/05/2015   HGB 13.7 10/08/2015   HCT 42.2 10/08/2015   MCV 82.3 10/08/2015   PLT 176 10/08/2015     STUDIES: No results found.  ASSESSMENT: Stage Ia ER/PR positive invasive lobular carcinoma of the upper  outer quadrant of the right breast.  PLAN:    1. Stage Ia ER/PR positive invasive lobular carcinoma of the upper outer quadrant of the right breast: Patient had her lumpectomy in October 2016. She completed XRT in approximately January 2017. She was initiated on tamoxifen in February 2017 and had poor toleration of this medication. Patient's symptoms resolved after discontinue tamoxifen. After lengthy discussion, she did not wish to reinitiate treatment or attempt another treatment such as letrozole. Today, she is wanting to know more about additional hormonal therapy to prevent recurrence. After discussing with Dr. Grayland Ormond, it was decided to check her FSH/LH level to see if she is postmenopausal and an AI is an option. Her last mammogram  was April, 2017 and was reported as BI-RADS 2. Next mammogram scheduled for November 2018. She would like to continue being monitored at this time. Will call her with results of lab and see if she would like more information on Aromatase Inhibitors if this is an option.   Patient expressed understanding and was in agreement with this plan. She also understands that She can call clinic at any time with any questions, concerns, or complaints.    Jacquelin Hawking, NP   06/21/2017 10:14 AM

## 2017-06-20 NOTE — Progress Notes (Signed)
Pt her with concerns about her care with doctor. I have addressed with pt that a member of management about her concerns.  About 2 months ago she had a milk duct that was stopped up and it got better with warm compresses and she called her surgeon.

## 2017-06-21 LAB — FSH/LH
FSH: 68.5 m[IU]/mL
LH: 61.7 m[IU]/mL

## 2017-06-23 ENCOUNTER — Telehealth: Payer: Self-pay | Admitting: Oncology

## 2017-06-23 NOTE — Telephone Encounter (Signed)
Appointment rescheduled from Dr Woodfin Ganja to Dr Janese Banks, per Freida Busman (verbal). 06/23/17 MF Patient is unhappy with Dr Valda Lamb services. Freida Busman and Judeen Hammans notified of new appointment date and time with Dr Janese Banks.

## 2017-09-05 ENCOUNTER — Ambulatory Visit
Admission: RE | Admit: 2017-09-05 | Discharge: 2017-09-05 | Disposition: A | Discharge status: Home / Self Care | Payer: Managed Care, Other (non HMO) | Source: Ambulatory Visit | Attending: Oncology | Admitting: Oncology

## 2017-09-05 ENCOUNTER — Other Ambulatory Visit: Payer: Self-pay | Admitting: Oncology

## 2017-09-05 DIAGNOSIS — Z170 Estrogen receptor positive status [ER+]: Principal | ICD-10-CM

## 2017-09-05 DIAGNOSIS — C50411 Malignant neoplasm of upper-outer quadrant of right female breast: Secondary | ICD-10-CM

## 2017-09-05 DIAGNOSIS — Z853 Personal history of malignant neoplasm of breast: Secondary | ICD-10-CM | POA: Diagnosis not present

## 2017-09-07 ENCOUNTER — Encounter: Payer: Self-pay | Admitting: General Surgery

## 2017-09-07 ENCOUNTER — Ambulatory Visit: Payer: 59 | Admitting: General Surgery

## 2017-09-07 VITALS — BP 138/82 | HR 90 | Resp 12 | Ht 66.0 in | Wt 196.0 lb

## 2017-09-07 DIAGNOSIS — C50911 Malignant neoplasm of unspecified site of right female breast: Secondary | ICD-10-CM

## 2017-09-07 NOTE — Patient Instructions (Addendum)
The patient is aware to call back for any questions or new concerns.  The patient has been asked to return to the office in one year with a bilateral diagnostic mammogram with Dr Bary Castilla.

## 2017-09-07 NOTE — Progress Notes (Signed)
Patient ID: Michelle Walker, female   DOB: 12-06-1960, 56 y.o.   MRN: [id number]  Chief Complaint  Patient presents with  . Follow-up    HPI Michelle Walker is a 56 y.o. female.  who presents for her follow up right breast cancer (invasive lobular carcinoma, T1, N0, ER/PR positive, Her2 negative, s/p lumpectomy and radiation) and a breast evaluation. The most recent mammogram was done on 09-05-17. Stopped taking tamoxifen over 1 year ago, because she "felt uneasy taking it" and did not like tolerate side effects well, including symptoms such as hot flashes and mood swings. Last menstrual period was March 2018. Admits to premenopausal symptoms. No new breast issues.  HPI  Past Medical History:  Diagnosis Date  . Anemia   . Arthritis   . Breast cancer ([place]) 08/2015   Radiation + right breast, T1,N0. ER/PR pos, Her 2 neg  . Headache   . Hypertension   . Personal history of radiation therapy   . Thyroid disease     Past Surgical History:  Procedure Laterality Date  . ANTERIOR CRUCIATE LIGAMENT REPAIR Right   . BREAST BIOPSY Right 07/17/2015   INVASIVE LOBULAR CARCINOMA, CLASSIC TYPE (1.5 MM), ARISING IN A   . COLONOSCOPY W/ POLYPECTOMY  2014  . DILATION AND CURETTAGE OF UTERUS     20 years ago  . NOVASURE ABLATION      Family History  Problem Relation Age of Onset  . Stroke Mother   . Cancer Sister        sarcoma on arm; chemo  . Diabetes Sister   . Stroke Sister   . Diabetes Brother   . Breast cancer Neg Hx   . Ovarian cancer Neg Hx   . Colon cancer Neg Hx   . Heart disease Neg Hx     Social History Social History   Tobacco Use  . Smoking status: Current Every Day Smoker    Packs/day: 0.50    Years: 35.00    Pack years: 17.50    Types: Cigarettes  . Smokeless tobacco: Never Used  Substance Use Topics  . Alcohol use: Yes    Alcohol/week: 1.2 oz    Types: 2 Standard drinks or equivalent per week    Comment: BEER 1-2 QD  . Drug use: No    Allergies  Allergen  Reactions  . Tape     SURGICAL TAPE AFTER BREAST BIOPSY    Current Outpatient Medications  Medication Sig Dispense Refill  . allopurinol (ZYLOPRIM) 100 MG tablet Take 100 mg by mouth at bedtime.    [name] amLODipine (NORVASC) 10 MG tablet Take 10 mg by mouth at bedtime.   0  . losartan (COZAAR) 100 MG tablet Take 100 mg by mouth at bedtime.   0  . metoprolol succinate (TOPROL-XL) 25 MG 24 hr tablet Take 25 mg by mouth at bedtime.   0  . Vitamin D, Ergocalciferol, (DRISDOL) 50000 units CAPS capsule Take 50,000 Units by mouth every 7 (seven) days.      No current facility-administered medications for this visit.     Review of Systems Review of Systems  Constitutional: Negative.   Respiratory: Negative.   Cardiovascular: Negative.     Blood pressure 138/82, pulse 90, resp. rate 12, height '5\' 6"'  (1.676 m), weight 196 lb (88.9 kg), last menstrual period 01/05/2017.  Physical Exam Physical Exam  Constitutional: She is oriented to person, place, and time. She appears well-developed and well-nourished.  HENT:  Mouth/Throat: Oropharynx is  clear and moist.  Eyes: Conjunctivae are normal. No scleral icterus.  Neck: Neck supple.  Cardiovascular: Normal rate, regular rhythm and normal heart sounds.  Pulmonary/Chest: Effort normal and breath sounds normal. No respiratory distress. Right breast exhibits skin change ( Moderate skin changed noted in upper outer quadrant due to radiation, stable). Right breast exhibits no inverted nipple, no mass, no nipple discharge and no tenderness. Left breast exhibits no inverted nipple, no mass, no nipple discharge, no skin change and no tenderness.    Moderate skin changes right breast, incision clean  Abdominal: Soft. Normal appearance. There is no hepatosplenomegaly.  Lymphadenopathy:    She has no cervical adenopathy.    She has no axillary adenopathy.  Neurological: She is alert and oriented to person, place, and time.  Skin: Skin is warm and dry.   Psychiatric: Her behavior is normal.    Data Reviewed Mammogram, previous notes Mammogram revealed stable lumpecomy changes in the right breast. No evidence of malignancy in either breast.     Assessment    2 years post right breast cancer- invasive lobular carcinoma, T1, N0, ER/PR positive, Her2 negative, s/p lumpectomy and radiation. Stopped taking tamoxifen over one year ago, did not tolerate side effects well. Discussed importance of medications for cancer prevention with patient. Encouraged her to discuss medication options other than tamoxifen with her oncologist for optimal prevention management. Mammogram and physical exam stable.      Plan    Follow up with by Dr Grayland Ormond as scheduled.   The patient has been asked to return to the office in one year with a bilateral diagnostic mammogram with Dr Bary Castilla.   HPI, Physical Exam, Assessment and Plan have been scribed under the direction and in the presence of Mckinley Jewel, MD Karie Fetch, RN  I have completed the exam and reviewed the above documentation for accuracy and completeness.  I agree with the above.  Haematologist has been used and any errors in dictation or transcription are unintentional.  Seeplaputhur G. Jamal Collin, M.D., F.A.C.S.  Junie Panning G 09/09/2017, 11:59 AM

## 2017-09-19 ENCOUNTER — Ambulatory Visit: Payer: Managed Care, Other (non HMO) | Admitting: Oncology

## 2017-09-19 NOTE — Progress Notes (Signed)
Hematology/Oncology Consult note [facility]  [phone] Fax:([phone]  Patient Care Team: Casilda Carls, MD as PCP - General (Internal Medicine) Casilda Carls, MD as Consulting Physician (Internal Medicine) Christene Lye, MD (General Surgery)   Name of the patient: Michelle Walker  [id number]  Mar 21, 1961   Date of visit: 09/19/17  Diagnosis- Stage Ia ER/PR positive invasive lobular carcinoma of the upper outer quadrant of the right breast.   Chief complaint/ Reason for visit- routine f/u of breast cancer  Heme/Onc history: Patient was diagnosed with stage Ia ER PR positive invasive lobular carcinoma of the right breast in 2016 status post lumpectomy in October 2016.  Initial tumor on biopsy was 1.5 mm grade 1 tumor.  LCIS and atypical ductal hyperplasia was noted on final pathology as well.  No residual carcinoma was identified on final pathology.  She completed radiation treatment in January 2017.  She did not require adjuvant chemotherapy.  She was started on tamoxifen in February 2017 but did not tolerate the medication well and stopped it after 6 months.  She did not want to try another treatment with letrozole.  She had hormone levels checked and her LH levels were 61.7 and FSH was 68.5.  Estradiol levels were not checked.  Her last regular menstrual cycle was in March 2018.  She has had endometrial ablation treatment done 2 years ago and since then her periods have been minimal to irregular  Interval history-patient has been off hormone therapy for over a year now.  She does not wish to go back on tamoxifen.  She still has on and off hot flashes which are not as bad as they were when she was on tamoxifen.  She also reports baseline arthritis  ECOG PS- 0 Pain scale- 0   Review of systems- Review of Systems  Constitutional: Negative for chills, fever, malaise/fatigue and weight loss.  HENT: Negative for congestion, ear  discharge and nosebleeds.   Eyes: Negative for blurred vision.  Respiratory: Negative for cough, hemoptysis, sputum production, shortness of breath and wheezing.   Cardiovascular: Negative for chest pain, palpitations, orthopnea and claudication.  Gastrointestinal: Negative for abdominal pain, blood in stool, constipation, diarrhea, heartburn, melena, nausea and vomiting.  Genitourinary: Negative for dysuria, flank pain, frequency, hematuria and urgency.  Musculoskeletal: Negative for back pain, joint pain and myalgias.  Skin: Negative for rash.  Neurological: Negative for dizziness, tingling, focal weakness, seizures, weakness and headaches.  Endo/Heme/Allergies: Does not bruise/bleed easily.  Psychiatric/Behavioral: Negative for depression and suicidal ideas. The patient does not have insomnia.        Allergies  Allergen Reactions  . Tape     SURGICAL TAPE AFTER BREAST BIOPSY     Past Medical History:  Diagnosis Date  . Anemia   . Arthritis   . Breast cancer ([place]) 08/2015   Radiation + right breast, T1,N0. ER/PR pos, Her 2 neg  . Headache   . Hypertension   . Personal history of radiation therapy   . Thyroid disease      Past Surgical History:  Procedure Laterality Date  . ANTERIOR CRUCIATE LIGAMENT REPAIR Right   . BREAST BIOPSY Right 07/17/2015   INVASIVE LOBULAR CARCINOMA, CLASSIC TYPE (1.5 MM), ARISING IN A   . BREAST LUMPECTOMY WITH SENTINEL LYMPH NODE BIOPSY Right 08/22/2015   Procedure: BREAST LUMPECTOMY WITH SENTINEL LYMPH NODE BX;  Surgeon: Christene Lye, MD;  Location: [facility] ORS;  Service: General;  Laterality: Right;  . COLONOSCOPY  W/ POLYPECTOMY  2014  . DILATION AND CURETTAGE OF UTERUS     20 years ago  . [place]      Social History   Socioeconomic History  . Marital status: Married    Spouse name: Not on file  . Number of children: Not on file  . Years of education: Not on file  . Highest education level: Not on file  Social  Needs  . Financial resource strain: Not on file  . Food insecurity - worry: Not on file  . Food insecurity - inability: Not on file  . Transportation needs - medical: Not on file  . Transportation needs - non-medical: Not on file  Occupational History  . Not on file  Tobacco Use  . Smoking status: Current Every Day Smoker    Packs/day: 0.50    Years: 35.00    Pack years: 17.50    Types: Cigarettes  . Smokeless tobacco: Never Used  Substance and Sexual Activity  . Alcohol use: Yes    Alcohol/week: 1.2 oz    Types: 2 Standard drinks or equivalent per week    Comment: BEER 1-2 QD  . Drug use: No  . Sexual activity: Yes    Comment: ablation  Other Topics Concern  . Not on file  Social History Narrative  . Not on file    Family History  Problem Relation Age of Onset  . Stroke Mother   . Cancer Sister        sarcoma on arm; chemo  . Diabetes Sister   . Stroke Sister   . Diabetes Brother   . Breast cancer Neg Hx   . Ovarian cancer Neg Hx   . Colon cancer Neg Hx   . Heart disease Neg Hx      Current Outpatient Medications:  .  allopurinol (ZYLOPRIM) 100 MG tablet, Take 100 mg by mouth at bedtime., Disp: , Rfl:  .  amLODipine (NORVASC) 10 MG tablet, Take 10 mg by mouth at bedtime. , Disp: , Rfl: 0 .  calcium-vitamin D (OSCAL WITH D) 500-200 MG-UNIT tablet, Take 1 tablet by mouth daily with breakfast., Disp: , Rfl:  .  losartan (COZAAR) 100 MG tablet, Take 100 mg by mouth at bedtime. , Disp: , Rfl: 0 .  metoprolol succinate (TOPROL-XL) 25 MG 24 hr tablet, Take 25 mg by mouth at bedtime. , Disp: , Rfl: 0  Physical exam:  Vitals:   09/20/17 1151  BP: (!) 146/90  Pulse: 91  Temp: 98 F (36.7 C)  TempSrc: Tympanic  Weight: 197 lb 14.4 oz (89.8 kg)   Physical Exam  Constitutional: She is oriented to person, place, and time and well-developed, well-nourished, and in no distress.  HENT:  Head: Normocephalic and atraumatic.  Eyes: EOM are normal. Pupils are equal,  round, and reactive to light.  Neck: Normal range of motion.  Cardiovascular: Normal rate, regular rhythm and normal heart sounds.  Pulmonary/Chest: Effort normal and breath sounds normal.  Abdominal: Soft. Bowel sounds are normal.  Neurological: She is alert and oriented to person, place, and time.  Skin: Skin is warm and dry.     CMP Latest Ref Rng & Units 08/05/2015  Glucose 65 - 99 mg/dL 100(H)  BUN 6 - 20 mg/dL 14  Creatinine 0.44 - 1.00 mg/dL 0.77  Sodium 135 - 145 mmol/L 137  Potassium 3.5 - 5.1 mmol/L 3.8  Chloride 101 - 111 mmol/L 111  CO2 22 - 32 mmol/L 23  Calcium  8.9 - 10.3 mg/dL 9.0  Total Protein 6.5 - 8.1 g/dL 6.8  Total Bilirubin 0.3 - 1.2 mg/dL 0.5  Alkaline Phos 38 - 126 U/L 61  AST 15 - 41 U/L 23  ALT 14 - 54 U/L 12(L)   CBC Latest Ref Rng & Units 10/08/2015  WBC 3.6 - 11.0 K/uL 4.6  Hemoglobin 12.0 - 16.0 g/dL 13.7  Hematocrit 35.0 - 47.0 % 42.2  Platelets 150 - 440 K/uL 176    No images are attached to the encounter.  Mm Diag Breast Tomo Bilateral  Result Date: 09/05/2017 CLINICAL DATA:  History of right breast cancer status post lumpectomy in 2016. EXAM: 2D DIGITAL DIAGNOSTIC BILATERAL MAMMOGRAM WITH CAD AND ADJUNCT TOMO COMPARISON:  Previous exam(s). ACR Breast Density Category b: There are scattered areas of fibroglandular density. FINDINGS: Stable lumpectomy changes are seen in the right breast. No mass or suspicious type microcalcifications identified in either breast. Mammographic images were processed with CAD. IMPRESSION: No evidence of malignancy in either breast. RECOMMENDATION: Bilateral diagnostic mammogram in 1 year is recommended. I have discussed the findings and recommendations with the patient. Results were also provided in writing at the conclusion of the visit. If applicable, a reminder letter will be sent to the patient regarding the next appointment. BI-RADS CATEGORY  2: Benign. Electronically Signed   By: Lillia Mountain M.D.   On: 09/05/2017  10:28     Assessment and plan- Patient is a 56 y.o. female with history of stage Ia invasive lobular carcinoma of the right breast ER PR positive HER-2/neu negative status post lumpectomy and adjuvant radiation treatment  I again discussed her pathology results from initial biopsy as well as final lumpectomy specimen.  Overall she had a very small 1.6 mm invasive lobular carcinoma.  There were also features of associated LCIS and atypical ductal hyperplasia noted on her final pathology.  She is therefore at an increased risk of recurrent breast cancer.  Given that she was ER PR positive in the past she ideally needs 5 years of hormone treatment  Patient is not definitively menopausal as her last menstrual cycle was in March 2018.  [place] and LH levels checked in August 2018 showed that her [facility] was 68.5 which would be in the menopausal range.  However I would like to obtain one more set of value today and I would like to check [facility] LH and estradiol to a certain if she is menopausal or not  If she is not menopausal than the only hormone therapy option would be tamoxifen which she does not want to try at this time  If she is menopausal based on her hormone levels she would like to think about trying aromatase inhibitor at this time.  Discussed risks and benefits of aromatase inhibitor including all but not limited to fatigue, hot flashes, mood swings, hypercholesterolemia, arthralgias and worsening bone health.-Patient is hesitant to start aromatase inhibitor given her pre-existing arthritis.  She also wanted to know if her chances of getting a recurrent breast cancer after taking aromatase inhibitors would be 0 and I said that would not be the case.  Endocrine therapy would only reduce her chances of having a recurrent breast cancer but would not eliminate the risk completely  We will call the patient after the results of her LH [facility] and estradiol are back and she will let us know if she is willing to  consider aromatase inhibitor therapy at this time.  Written information about Arimidex has been given  to her  I will see her back in February 2019.  No blood work.  She did have recent mammogram in November 2018 which did not show any evidence of malignancy.  She will be due for repeat mammogram in November 2019.   Visit Diagnosis 1. Malignant neoplasm of upper-outer quadrant of right breast in female, estrogen receptor positive ([place])      Dr. [name], MD, MPH [facility] Pager- [phone] 09/20/2017 12:51 PM

## 2017-09-20 ENCOUNTER — Encounter: Payer: Self-pay | Admitting: Oncology

## 2017-09-20 ENCOUNTER — Inpatient Hospital Stay: Payer: Managed Care, Other (non HMO)

## 2017-09-20 ENCOUNTER — Inpatient Hospital Stay: Payer: Managed Care, Other (non HMO) | Attending: Oncology | Admitting: Oncology

## 2017-09-20 ENCOUNTER — Other Ambulatory Visit: Payer: Self-pay

## 2017-09-20 VITALS — BP 146/90 | HR 91 | Temp 98.0°F | Wt 197.9 lb

## 2017-09-20 DIAGNOSIS — I10 Essential (primary) hypertension: Secondary | ICD-10-CM | POA: Insufficient documentation

## 2017-09-20 DIAGNOSIS — Z79899 Other long term (current) drug therapy: Secondary | ICD-10-CM | POA: Diagnosis not present

## 2017-09-20 DIAGNOSIS — E079 Disorder of thyroid, unspecified: Secondary | ICD-10-CM | POA: Diagnosis not present

## 2017-09-20 DIAGNOSIS — C50411 Malignant neoplasm of upper-outer quadrant of right female breast: Secondary | ICD-10-CM | POA: Insufficient documentation

## 2017-09-20 DIAGNOSIS — M1990 Unspecified osteoarthritis, unspecified site: Secondary | ICD-10-CM | POA: Diagnosis not present

## 2017-09-20 DIAGNOSIS — F17210 Nicotine dependence, cigarettes, uncomplicated: Secondary | ICD-10-CM | POA: Diagnosis not present

## 2017-09-20 DIAGNOSIS — Z170 Estrogen receptor positive status [ER+]: Secondary | ICD-10-CM | POA: Diagnosis not present

## 2017-09-20 DIAGNOSIS — Z808 Family history of malignant neoplasm of other organs or systems: Secondary | ICD-10-CM | POA: Diagnosis not present

## 2017-09-20 DIAGNOSIS — Z9223 Personal history of estrogen therapy: Secondary | ICD-10-CM

## 2017-09-20 DIAGNOSIS — G43909 Migraine, unspecified, not intractable, without status migrainosus: Secondary | ICD-10-CM | POA: Insufficient documentation

## 2017-09-20 DIAGNOSIS — Z923 Personal history of irradiation: Secondary | ICD-10-CM | POA: Diagnosis not present

## 2017-09-20 DIAGNOSIS — E119 Type 2 diabetes mellitus without complications: Secondary | ICD-10-CM | POA: Insufficient documentation

## 2017-09-20 DIAGNOSIS — M109 Gout, unspecified: Secondary | ICD-10-CM | POA: Insufficient documentation

## 2017-09-20 DIAGNOSIS — F32A Depression, unspecified: Secondary | ICD-10-CM | POA: Insufficient documentation

## 2017-09-20 DIAGNOSIS — F329 Major depressive disorder, single episode, unspecified: Secondary | ICD-10-CM | POA: Insufficient documentation

## 2017-09-20 HISTORY — DX: Essential (primary) hypertension: I10

## 2017-09-20 HISTORY — DX: Migraine, unspecified, not intractable, without status migrainosus: G43.909

## 2017-09-21 ENCOUNTER — Telehealth: Payer: Self-pay | Admitting: *Deleted

## 2017-09-21 LAB — FSH/LH
FSH: 68 m[IU]/mL
LH: 61.5 m[IU]/mL

## 2017-09-21 LAB — ESTRADIOL: Estradiol: 13.2 pg/mL

## 2017-09-21 NOTE — Telephone Encounter (Signed)
Left vm for patient regarding lab results from yesterday. Per Dr. Janese Banks patient is post menopausal according to labs that were drawn. Dr. Janese Banks recommends that patient started to AI if patient agrees.

## 2017-09-26 ENCOUNTER — Telehealth: Payer: Self-pay | Admitting: *Deleted

## 2017-09-26 NOTE — Telephone Encounter (Signed)
Left VM message on patient's cell phone to call back to discuss lab results and aromatase inhibitor.  dhs

## 2017-10-04 ENCOUNTER — Telehealth: Payer: Self-pay | Admitting: *Deleted

## 2017-10-04 NOTE — Progress Notes (Signed)
We will just wait for her next appointment

## 2017-10-04 NOTE — Telephone Encounter (Signed)
Left VM message for patient to call us back regarding lab results and starting aromatase inhibitor.     dhs

## 2017-10-20 ENCOUNTER — Inpatient Hospital Stay: Payer: 59 | Attending: Oncology | Admitting: Oncology

## 2017-10-20 VITALS — BP 156/92 | HR 98 | Temp 97.6°F | Wt 197.0 lb

## 2017-10-20 DIAGNOSIS — F17210 Nicotine dependence, cigarettes, uncomplicated: Secondary | ICD-10-CM | POA: Diagnosis not present

## 2017-10-20 DIAGNOSIS — Z923 Personal history of irradiation: Secondary | ICD-10-CM | POA: Insufficient documentation

## 2017-10-20 DIAGNOSIS — Z780 Asymptomatic menopausal state: Secondary | ICD-10-CM | POA: Diagnosis not present

## 2017-10-20 DIAGNOSIS — Z170 Estrogen receptor positive status [ER+]: Secondary | ICD-10-CM | POA: Diagnosis not present

## 2017-10-20 DIAGNOSIS — C50411 Malignant neoplasm of upper-outer quadrant of right female breast: Secondary | ICD-10-CM | POA: Diagnosis present

## 2017-10-20 DIAGNOSIS — M1990 Unspecified osteoarthritis, unspecified site: Secondary | ICD-10-CM | POA: Diagnosis not present

## 2017-10-20 DIAGNOSIS — Z79811 Long term (current) use of aromatase inhibitors: Secondary | ICD-10-CM | POA: Diagnosis not present

## 2017-10-20 DIAGNOSIS — Z85828 Personal history of other malignant neoplasm of skin: Secondary | ICD-10-CM

## 2017-10-20 DIAGNOSIS — R232 Flushing: Secondary | ICD-10-CM | POA: Insufficient documentation

## 2017-10-20 DIAGNOSIS — E079 Disorder of thyroid, unspecified: Secondary | ICD-10-CM | POA: Insufficient documentation

## 2017-10-20 DIAGNOSIS — Z79899 Other long term (current) drug therapy: Secondary | ICD-10-CM

## 2017-10-20 DIAGNOSIS — I10 Essential (primary) hypertension: Secondary | ICD-10-CM | POA: Diagnosis not present

## 2017-10-20 MED ORDER — ANASTROZOLE 1 MG PO TABS: 1.0000 mg | ORAL_TABLET | Freq: Every day | ORAL | 3 refills | Status: DC

## 2017-10-20 NOTE — Progress Notes (Signed)
Hematology/Oncology Consult note [facility]  Telephone:([phone] [phone]  Patient Care Team: Casilda Carls, MD as PCP - General (Internal Medicine) Casilda Carls, MD as Consulting Physician (Internal Medicine) Christene Lye, MD (General Surgery)   Name of the patient: Michelle Walker  [id number]  04-Mar-1961   Date of visit: 10/20/17  Diagnosis- Stage Ia ER/PR positive invasive lobular carcinoma of the upper outer quadrant of the right breast.   Chief complaint/ Reason for visit- routine f/u of breast cancer  Heme/Onc history: Patient was diagnosed with stage Ia ER PR positive invasive lobular carcinoma of the right breast in 2016 status post lumpectomy in October 2016.  Initial tumor on biopsy was 1.5 mm grade 1 tumor.  LCIS and atypical ductal hyperplasia was noted on final pathology as well.  No residual carcinoma was identified on final pathology.  She completed radiation treatment in January 2017.  She did not require adjuvant chemotherapy.  She was started on tamoxifen in February 2017 but did not tolerate the medication well and stopped it after 6 months.  She did not want to try another treatment with letrozole.  She had hormone levels checked and her LH levels were 61.7 and FSH was 68.5.  Estradiol levels were not checked.  Her last regular menstrual cycle was in March 2018.  She has had endometrial ablation treatment done 2 years ago and since then her periods have been minimal to irregular  patient has been off hormone therapy for over a year now.  She does not wish to go back on tamoxifen.  She still has on and off hot flashes which are not as bad as they were when she was on tamoxifen.  She also reports baseline arthritis  LSH estradiol levels checked on 09/20/17 were consistent in the post menopausal range  Interval history- Patient is now willing to try AI for hormone therapy. Doing well. Has baseline arthritis. Denies  other complaints  ECOG PS- 0 Pain scale- 0   Review of systems- Review of Systems  Constitutional: Negative for chills, fever, malaise/fatigue and weight loss.  HENT: Negative for congestion, ear discharge and nosebleeds.   Eyes: Negative for blurred vision.  Respiratory: Negative for cough, hemoptysis, sputum production, shortness of breath and wheezing.   Cardiovascular: Negative for chest pain, palpitations, orthopnea and claudication.  Gastrointestinal: Negative for abdominal pain, blood in stool, constipation, diarrhea, heartburn, melena, nausea and vomiting.  Genitourinary: Negative for dysuria, flank pain, frequency, hematuria and urgency.  Musculoskeletal: Negative for back pain, joint pain and myalgias.  Skin: Negative for rash.  Neurological: Negative for dizziness, tingling, focal weakness, seizures, weakness and headaches.  Endo/Heme/Allergies: Does not bruise/bleed easily.  Psychiatric/Behavioral: Negative for depression and suicidal ideas. The patient does not have insomnia.       Allergies  Allergen Reactions  . Tape     SURGICAL TAPE AFTER BREAST BIOPSY     Past Medical History:  Diagnosis Date  . Anemia   . Arthritis   . Breast cancer ([place]) 08/2015   Radiation + right breast, T1,N0. ER/PR pos, Her 2 neg  . Headache   . Hypertension   . Personal history of radiation therapy   . Thyroid disease      Past Surgical History:  Procedure Laterality Date  . ANTERIOR CRUCIATE LIGAMENT REPAIR Right   . BREAST BIOPSY Right 07/17/2015   INVASIVE LOBULAR CARCINOMA, CLASSIC TYPE (1.5 MM), ARISING IN A   . BREAST LUMPECTOMY WITH SENTINEL LYMPH  NODE BIOPSY Right 08/22/2015   Procedure: BREAST LUMPECTOMY WITH SENTINEL LYMPH NODE BX;  Surgeon: Christene Lye, MD;  Location: [facility] ORS;  Service: General;  Laterality: Right;  . COLONOSCOPY W/ POLYPECTOMY  2014  . DILATION AND CURETTAGE OF UTERUS     20 years ago  . [place]      Social History    Socioeconomic History  . Marital status: Married    Spouse name: Not on file  . Number of children: Not on file  . Years of education: Not on file  . Highest education level: Not on file  Social Needs  . Financial resource strain: Not on file  . Food insecurity - worry: Not on file  . Food insecurity - inability: Not on file  . Transportation needs - medical: Not on file  . Transportation needs - non-medical: Not on file  Occupational History  . Not on file  Tobacco Use  . Smoking status: Current Every Day Smoker    Packs/day: 0.50    Years: 35.00    Pack years: 17.50    Types: Cigarettes  . Smokeless tobacco: Never Used  Substance and Sexual Activity  . Alcohol use: Yes    Alcohol/week: 1.2 oz    Types: 2 Standard drinks or equivalent per week    Comment: BEER 1-2 QD  . Drug use: No  . Sexual activity: Yes    Comment: ablation  Other Topics Concern  . Not on file  Social History Narrative  . Not on file    Family History  Problem Relation Age of Onset  . Stroke Mother   . Cancer Sister        sarcoma on arm; chemo  . Diabetes Sister   . Stroke Sister   . Diabetes Brother   . Breast cancer Neg Hx   . Ovarian cancer Neg Hx   . Colon cancer Neg Hx   . Heart disease Neg Hx      Current Outpatient Medications:  .  allopurinol (ZYLOPRIM) 100 MG tablet, Take 100 mg by mouth at bedtime., Disp: , Rfl:  .  amLODipine (NORVASC) 10 MG tablet, Take 10 mg by mouth at bedtime. , Disp: , Rfl: 0 .  calcium-vitamin D (OSCAL WITH D) 500-200 MG-UNIT tablet, Take 1 tablet by mouth daily with breakfast., Disp: , Rfl:  .  losartan (COZAAR) 100 MG tablet, Take 100 mg by mouth at bedtime. , Disp: , Rfl: 0 .  metoprolol succinate (TOPROL-XL) 25 MG 24 hr tablet, Take 25 mg by mouth at bedtime. , Disp: , Rfl: 0 .  anastrozole (ARIMIDEX) 1 MG tablet, Take 1 tablet (1 mg total) by mouth daily., Disp: 30 tablet, Rfl: 3  Physical exam:  Vitals:   10/20/17 1449  BP: (!) 156/92   Pulse: 98  Temp: 97.6 F (36.4 C)  TempSrc: Tympanic  Weight: 197 lb (89.4 kg)   Physical Exam  Constitutional: She is oriented to person, place, and time and well-developed, well-nourished, and in no distress.  HENT:  Head: Normocephalic and atraumatic.  Eyes: EOM are normal. Pupils are equal, round, and reactive to light.  Neck: Normal range of motion.  Cardiovascular: Normal rate, regular rhythm and normal heart sounds.  Pulmonary/Chest: Effort normal and breath sounds normal.  Abdominal: Soft. Bowel sounds are normal.  Neurological: She is alert and oriented to person, place, and time.  Skin: Skin is warm and dry.     CMP Latest Ref Rng & Units  08/05/2015  Glucose 65 - 99 mg/dL 100(H)  BUN 6 - 20 mg/dL 14  Creatinine 0.44 - 1.00 mg/dL 0.77  Sodium 135 - 145 mmol/L 137  Potassium 3.5 - 5.1 mmol/L 3.8  Chloride 101 - 111 mmol/L 111  CO2 22 - 32 mmol/L 23  Calcium 8.9 - 10.3 mg/dL 9.0  Total Protein 6.5 - 8.1 g/dL 6.8  Total Bilirubin 0.3 - 1.2 mg/dL 0.5  Alkaline Phos 38 - 126 U/L 61  AST 15 - 41 U/L 23  ALT 14 - 54 U/L 12(L)   CBC Latest Ref Rng & Units 10/08/2015  WBC 3.6 - 11.0 K/uL 4.6  Hemoglobin 12.0 - 16.0 g/dL 13.7  Hematocrit 35.0 - 47.0 % 42.2  Platelets 150 - 440 K/uL 176     Assessment and plan- Patient is a 56 y.o. female history of stage Ia invasive lobular carcinoma of the right breast ER PR positive HER-2/neu negative status post lumpectomy and adjuvant radiation treatment  Since patient is willing to try AI for hormone therapy, I discussed risks and benefits of Arimidex including all but not limited to fatigue, joint pains, worsening bone health, hypercholesterolemia.  Patient understands and agrees to proceed.  We will prescribe Arimidex 1 mg p.o. daily.  She will also take calcium and vitamin D 1200 mg and 800 international units respectively daily.  Recent mammogram from 09/05/2017 was within normal limits and she is due for next mammogram in November  2019.  I will obtain a baseline bone density at this time.  I will see her back in 6 weeks time to see how she is tolerating her Arimidex.  Bone density prior   Visit Diagnosis 1. Malignant neoplasm of upper-outer quadrant of right breast in female, estrogen receptor positive ([place])   2. Postmenopausal      Dr. [name], MD, MPH [facility] Pager- [phone] 10/20/2017 3:23 PM

## 2017-10-24 ENCOUNTER — Encounter: Payer: Self-pay | Admitting: Oncology

## 2017-10-31 ENCOUNTER — Other Ambulatory Visit: Payer: Managed Care, Other (non HMO)

## 2017-12-07 ENCOUNTER — Inpatient Hospital Stay: Payer: 59 | Attending: Oncology

## 2017-12-07 DIAGNOSIS — M1990 Unspecified osteoarthritis, unspecified site: Secondary | ICD-10-CM | POA: Insufficient documentation

## 2017-12-07 DIAGNOSIS — C50411 Malignant neoplasm of upper-outer quadrant of right female breast: Secondary | ICD-10-CM | POA: Insufficient documentation

## 2017-12-07 DIAGNOSIS — R232 Flushing: Secondary | ICD-10-CM | POA: Diagnosis not present

## 2017-12-07 DIAGNOSIS — M25552 Pain in left hip: Secondary | ICD-10-CM | POA: Insufficient documentation

## 2017-12-07 DIAGNOSIS — Z79899 Other long term (current) drug therapy: Secondary | ICD-10-CM | POA: Diagnosis not present

## 2017-12-07 DIAGNOSIS — Z170 Estrogen receptor positive status [ER+]: Secondary | ICD-10-CM | POA: Insufficient documentation

## 2017-12-07 DIAGNOSIS — Z79811 Long term (current) use of aromatase inhibitors: Secondary | ICD-10-CM | POA: Diagnosis not present

## 2017-12-07 DIAGNOSIS — F17210 Nicotine dependence, cigarettes, uncomplicated: Secondary | ICD-10-CM | POA: Insufficient documentation

## 2017-12-07 DIAGNOSIS — E079 Disorder of thyroid, unspecified: Secondary | ICD-10-CM | POA: Insufficient documentation

## 2017-12-07 DIAGNOSIS — Z780 Asymptomatic menopausal state: Secondary | ICD-10-CM | POA: Insufficient documentation

## 2017-12-07 DIAGNOSIS — Z85828 Personal history of other malignant neoplasm of skin: Secondary | ICD-10-CM | POA: Diagnosis not present

## 2017-12-07 DIAGNOSIS — Z923 Personal history of irradiation: Secondary | ICD-10-CM | POA: Diagnosis not present

## 2017-12-07 DIAGNOSIS — I10 Essential (primary) hypertension: Secondary | ICD-10-CM | POA: Insufficient documentation

## 2017-12-07 LAB — COMPREHENSIVE METABOLIC PANEL
ALT: 9 U/L — ABNORMAL LOW (ref 14–54)
AST: 18 U/L (ref 15–41)
Albumin: 3.5 g/dL (ref 3.5–5.0)
Alkaline Phosphatase: 87 U/L (ref 38–126)
Anion gap: 12 (ref 5–15)
BUN: 16 mg/dL (ref 6–20)
CO2: 24 mmol/L (ref 22–32)
Calcium: 9.8 mg/dL (ref 8.9–10.3)
Chloride: 103 mmol/L (ref 101–111)
Creatinine, Ser: 0.77 mg/dL (ref 0.44–1.00)
GFR calc Af Amer: >60 mL/min (ref >60)
GFR calc non Af Amer: >60 mL/min (ref >60)
Glucose, Bld: 90 mg/dL (ref 65–99)
Potassium: 3.8 mmol/L (ref 3.5–5.1)
Sodium: 139 mmol/L (ref 135–145)
Total Bilirubin: 0.4 mg/dL (ref 0.3–1.2)
Total Protein: 7.5 g/dL (ref 6.5–8.1)

## 2017-12-08 ENCOUNTER — Inpatient Hospital Stay: Payer: 59 | Admitting: Oncology

## 2017-12-08 ENCOUNTER — Other Ambulatory Visit: Payer: Self-pay | Admitting: *Deleted

## 2017-12-08 ENCOUNTER — Encounter: Payer: Self-pay | Admitting: Oncology

## 2017-12-08 VITALS — BP 160/97 | HR 87 | Temp 98.3°F | Resp 18 | Ht 66.0 in | Wt 195.2 lb

## 2017-12-08 DIAGNOSIS — Z923 Personal history of irradiation: Secondary | ICD-10-CM | POA: Diagnosis not present

## 2017-12-08 DIAGNOSIS — M25552 Pain in left hip: Secondary | ICD-10-CM

## 2017-12-08 DIAGNOSIS — F17210 Nicotine dependence, cigarettes, uncomplicated: Secondary | ICD-10-CM | POA: Diagnosis not present

## 2017-12-08 DIAGNOSIS — C50411 Malignant neoplasm of upper-outer quadrant of right female breast: Secondary | ICD-10-CM

## 2017-12-08 DIAGNOSIS — Z79899 Other long term (current) drug therapy: Secondary | ICD-10-CM

## 2017-12-08 DIAGNOSIS — R232 Flushing: Secondary | ICD-10-CM

## 2017-12-08 DIAGNOSIS — Z79811 Long term (current) use of aromatase inhibitors: Secondary | ICD-10-CM | POA: Diagnosis not present

## 2017-12-08 DIAGNOSIS — Z85828 Personal history of other malignant neoplasm of skin: Secondary | ICD-10-CM

## 2017-12-08 DIAGNOSIS — E079 Disorder of thyroid, unspecified: Secondary | ICD-10-CM

## 2017-12-08 DIAGNOSIS — Z170 Estrogen receptor positive status [ER+]: Secondary | ICD-10-CM | POA: Diagnosis not present

## 2017-12-08 DIAGNOSIS — Z780 Asymptomatic menopausal state: Secondary | ICD-10-CM | POA: Diagnosis not present

## 2017-12-08 DIAGNOSIS — I10 Essential (primary) hypertension: Secondary | ICD-10-CM | POA: Diagnosis not present

## 2017-12-08 NOTE — Progress Notes (Signed)
Patient only c/o of (right hip pain).

## 2017-12-09 NOTE — Progress Notes (Signed)
Hematology/Oncology Consult note [facility]  Telephone:([phone] [phone]  Patient Care Team: Casilda Carls, MD as PCP - General (Internal Medicine) Casilda Carls, MD as Consulting Physician (Internal Medicine) Christene Lye, MD (General Surgery)   Name of the patient: Michelle Walker  [id number]  09-17-1961   Date of visit: 12/09/17   Diagnosis-Stage IA ER/PR positive invasive lobular carcinoma of the upper outer quadrant of the right breast.   Chief complaint/ Reason for visit-routine f/u of breast cancer  Heme/Onc history:Patient was diagnosed with stage Ia ER PR positive invasive lobular carcinoma of the right breast in 2016 status post lumpectomy in October 2016. Initial tumor on biopsy was 1.5 mm grade 1 tumor. LCIS and atypical ductalhyperplasia was noted on final pathology as well. No residual carcinoma was identified on final pathology. She completed radiation treatment in January 2017. She did not require adjuvant chemotherapy. She was started on tamoxifen in February 2017 but did not tolerate the medication well and stopped it after 6 months. She did not want to try another treatment with letrozole. She had hormone levels checked and her LH levels were 61.7 and FSH was 68.5. Estradiol levels were not checked.Her last regular menstrual cycle was in March 2018. She has had endometrial ablation treatment done 2 years ago and since then her periods have been minimal to irregular  patient has been off hormone therapy for over a year now. She does not wish to go back on tamoxifen. She still has on and off hot flashes which are not as bad as they were when she was on tamoxifen. She also reports baseline arthritis  LSH estradiol levels checked on 09/20/17 were consistent in the post menopausal range   Interval history- reports some pain in her left hip and knees since she started arimidex which is self limited  and not affecting her day to day life. Also has self limited hot flashes  ECOG PS- 0 Pain scale- 0   Review of systems- Review of Systems  Constitutional: Negative for chills, fever, malaise/fatigue and weight loss.  HENT: Negative for congestion, ear discharge and nosebleeds.   Eyes: Negative for blurred vision.  Respiratory: Negative for cough, hemoptysis, sputum production, shortness of breath and wheezing.   Cardiovascular: Negative for chest pain, palpitations, orthopnea and claudication.  Gastrointestinal: Negative for abdominal pain, blood in stool, constipation, diarrhea, heartburn, melena, nausea and vomiting.  Genitourinary: Negative for dysuria, flank pain, frequency, hematuria and urgency.  Musculoskeletal: Positive for joint pain. Negative for back pain and myalgias.  Skin: Negative for rash.  Neurological: Negative for dizziness, tingling, focal weakness, seizures, weakness and headaches.  Endo/Heme/Allergies: Does not bruise/bleed easily.       Hot flashes +  Psychiatric/Behavioral: Negative for depression and suicidal ideas. The patient does not have insomnia.      Allergies  Allergen Reactions  . Tape     SURGICAL TAPE AFTER BREAST BIOPSY     Past Medical History:  Diagnosis Date  . Anemia   . Arthritis   . Breast cancer ([place]) 08/2015   Radiation + right breast, T1,N0. ER/PR pos, Her 2 neg  . Headache   . Hypertension   . Personal history of radiation therapy   . Thyroid disease      Past Surgical History:  Procedure Laterality Date  . ANTERIOR CRUCIATE LIGAMENT REPAIR Right   . BREAST BIOPSY Right 07/17/2015   INVASIVE LOBULAR CARCINOMA, CLASSIC TYPE (1.5 MM), ARISING IN A   .  BREAST LUMPECTOMY WITH SENTINEL LYMPH NODE BIOPSY Right 08/22/2015   Procedure: BREAST LUMPECTOMY WITH SENTINEL LYMPH NODE BX;  Surgeon: Christene Lye, MD;  Location: [facility] ORS;  Service: General;  Laterality: Right;  . COLONOSCOPY W/ POLYPECTOMY  2014  . DILATION AND  CURETTAGE OF UTERUS     20 years ago  . [place]      Social History   Socioeconomic History  . Marital status: Married    Spouse name: Not on file  . Number of children: Not on file  . Years of education: Not on file  . Highest education level: Not on file  Social Needs  . Financial resource strain: Not on file  . Food insecurity - worry: Not on file  . Food insecurity - inability: Not on file  . Transportation needs - medical: Not on file  . Transportation needs - non-medical: Not on file  Occupational History  . Not on file  Tobacco Use  . Smoking status: Current Every Day Smoker    Packs/day: 0.50    Years: 35.00    Pack years: 17.50    Types: Cigarettes  . Smokeless tobacco: Never Used  Substance and Sexual Activity  . Alcohol use: Yes    Alcohol/week: 1.2 oz    Types: 2 Standard drinks or equivalent per week    Comment: BEER 1-2 QD  . Drug use: No  . Sexual activity: Yes    Comment: ablation  Other Topics Concern  . Not on file  Social History Narrative  . Not on file    Family History  Problem Relation Age of Onset  . Stroke Mother   . Cancer Sister        sarcoma on arm; chemo  . Diabetes Sister   . Stroke Sister   . Diabetes Brother   . Breast cancer Neg Hx   . Ovarian cancer Neg Hx   . Colon cancer Neg Hx   . Heart disease Neg Hx      Current Outpatient Medications:  .  allopurinol (ZYLOPRIM) 100 MG tablet, Take 100 mg by mouth at bedtime., Disp: , Rfl:  .  amLODipine (NORVASC) 10 MG tablet, Take 10 mg by mouth at bedtime. , Disp: , Rfl: 0 .  anastrozole (ARIMIDEX) 1 MG tablet, Take 1 tablet (1 mg total) by mouth daily., Disp: 30 tablet, Rfl: 3 .  calcium-vitamin D (OSCAL WITH D) 500-200 MG-UNIT tablet, Take 1 tablet by mouth daily with breakfast., Disp: , Rfl:  .  losartan (COZAAR) 100 MG tablet, Take 100 mg by mouth at bedtime. , Disp: , Rfl: 0 .  metoprolol succinate (TOPROL-XL) 25 MG 24 hr tablet, Take 25 mg by mouth at bedtime. ,  Disp: , Rfl: 0  Physical exam:  Vitals:   12/08/17 1537  BP: (!) 160/97  Pulse: 87  Resp: 18  Temp: 98.3 F (36.8 C)  TempSrc: Tympanic  Weight: 195 lb 3.2 oz (88.5 kg)  Height: '5\' 6"'  (1.676 m)   Physical Exam  Constitutional: She is oriented to person, place, and time and well-developed, well-nourished, and in no distress.  HENT:  Head: Normocephalic and atraumatic.  Eyes: EOM are normal. Pupils are equal, round, and reactive to light.  Neck: Normal range of motion.  Cardiovascular: Normal rate, regular rhythm and normal heart sounds.  Pulmonary/Chest: Effort normal and breath sounds normal.  Abdominal: Soft. Bowel sounds are normal.  Neurological: She is alert and oriented to person, place, and time.  Skin: Skin is warm and dry.     CMP Latest Ref Rng & Units 12/07/2017  Glucose 65 - 99 mg/dL 90  BUN 6 - 20 mg/dL 16  Creatinine 0.44 - 1.00 mg/dL 0.77  Sodium 135 - 145 mmol/L 139  Potassium 3.5 - 5.1 mmol/L 3.8  Chloride 101 - 111 mmol/L 103  CO2 22 - 32 mmol/L 24  Calcium 8.9 - 10.3 mg/dL 9.8  Total Protein 6.5 - 8.1 g/dL 7.5  Total Bilirubin 0.3 - 1.2 mg/dL 0.4  Alkaline Phos 38 - 126 U/L 87  AST 15 - 41 U/L 18  ALT 14 - 54 U/L 9(L)   CBC Latest Ref Rng & Units 10/08/2015  WBC 3.6 - 11.0 K/uL 4.6  Hemoglobin 12.0 - 16.0 g/dL 13.7  Hematocrit 35.0 - 47.0 % 42.2  Platelets 150 - 440 K/uL 176    No images are attached to the encounter.  No results found.   Assessment and plan- Patient is a 57 y.o. female of stage Ia invasive lobular carcinoma of the right breast ER PR positive HER-2/neu negative status post lumpectomy and adjuvant radiation treatment. Patent took tamoxifen for a year then stopped due to side effects. Now back on arimidex after gap of 2 years  Patient is tolerating arimidex relatively well with minor arthralgias and hot flashes. She wishes to continue taking it at this time. She will also take calcium and Vit D supplements. Bone density scan  scheduled this month. Hormone levels are post menopausal.  I will see her back in 4 months with cmp     Visit Diagnosis 1. Malignant neoplasm of upper-outer quadrant of right breast in female, estrogen receptor positive ([place])      Dr. [name], MD, MPH [place] [facility] Pager- [phone] 12/09/2017 1:01 PM

## 2017-12-14 ENCOUNTER — Other Ambulatory Visit: Payer: Managed Care, Other (non HMO)

## 2017-12-20 ENCOUNTER — Ambulatory Visit: Payer: Managed Care, Other (non HMO) | Admitting: Oncology

## 2017-12-21 ENCOUNTER — Other Ambulatory Visit: Payer: Managed Care, Other (non HMO)

## 2018-03-09 ENCOUNTER — Ambulatory Visit: Payer: 59 | Admitting: Oncology

## 2018-03-09 ENCOUNTER — Other Ambulatory Visit: Payer: 59

## 2018-04-06 ENCOUNTER — Inpatient Hospital Stay: Payer: 59

## 2018-04-06 ENCOUNTER — Inpatient Hospital Stay: Payer: 59 | Admitting: Oncology

## 2018-04-13 ENCOUNTER — Inpatient Hospital Stay: Payer: 59 | Attending: Oncology

## 2018-04-13 ENCOUNTER — Inpatient Hospital Stay (HOSPITAL_BASED_OUTPATIENT_CLINIC_OR_DEPARTMENT_OTHER): Payer: 59 | Admitting: Oncology

## 2018-04-13 ENCOUNTER — Encounter: Payer: Self-pay | Admitting: Oncology

## 2018-04-13 VITALS — BP 149/97 | HR 92 | Temp 96.7°F | Resp 18 | Ht 66.0 in | Wt 189.2 lb

## 2018-04-13 DIAGNOSIS — C50411 Malignant neoplasm of upper-outer quadrant of right female breast: Secondary | ICD-10-CM | POA: Diagnosis present

## 2018-04-13 DIAGNOSIS — E079 Disorder of thyroid, unspecified: Secondary | ICD-10-CM | POA: Diagnosis not present

## 2018-04-13 DIAGNOSIS — R5381 Other malaise: Secondary | ICD-10-CM | POA: Diagnosis not present

## 2018-04-13 DIAGNOSIS — Z85828 Personal history of other malignant neoplasm of skin: Secondary | ICD-10-CM | POA: Diagnosis not present

## 2018-04-13 DIAGNOSIS — R5383 Other fatigue: Secondary | ICD-10-CM | POA: Insufficient documentation

## 2018-04-13 DIAGNOSIS — Z9223 Personal history of estrogen therapy: Secondary | ICD-10-CM | POA: Insufficient documentation

## 2018-04-13 DIAGNOSIS — I10 Essential (primary) hypertension: Secondary | ICD-10-CM | POA: Diagnosis not present

## 2018-04-13 DIAGNOSIS — F17210 Nicotine dependence, cigarettes, uncomplicated: Secondary | ICD-10-CM

## 2018-04-13 DIAGNOSIS — Z923 Personal history of irradiation: Secondary | ICD-10-CM

## 2018-04-13 DIAGNOSIS — Z170 Estrogen receptor positive status [ER+]: Secondary | ICD-10-CM | POA: Diagnosis not present

## 2018-04-13 DIAGNOSIS — Z780 Asymptomatic menopausal state: Secondary | ICD-10-CM

## 2018-04-13 DIAGNOSIS — M1990 Unspecified osteoarthritis, unspecified site: Secondary | ICD-10-CM | POA: Diagnosis not present

## 2018-04-13 DIAGNOSIS — Z79899 Other long term (current) drug therapy: Secondary | ICD-10-CM

## 2018-04-13 DIAGNOSIS — Z08 Encounter for follow-up examination after completed treatment for malignant neoplasm: Secondary | ICD-10-CM

## 2018-04-13 DIAGNOSIS — Z853 Personal history of malignant neoplasm of breast: Secondary | ICD-10-CM

## 2018-04-13 NOTE — Progress Notes (Signed)
No new changes noted today 

## 2018-04-14 NOTE — Progress Notes (Signed)
Hematology/Oncology Consult note [facility]  [phone] [phone]  Patient Care Team: Casilda Carls, MD as PCP - General (Internal Medicine) Casilda Carls, MD as Consulting Physician (Internal Medicine) Christene Lye, MD (General Surgery)   Name of the patient: Michelle Walker  [id number]  [birth year]   Date of visit: 04/14/18  Diagnosis- Stage IA ER/PR positive invasive lobular carcinoma of the upper outer quadrant of the right breast.  Chief complaint/ Reason for visit- routine f/u of breast cancer  Heme/Onc history: Patient was diagnosed with stage Ia ER PR positive invasive lobular carcinoma of the right breast in 2016 status post lumpectomy in October 2016. Initial tumor on biopsy was 1.5 mm grade 1 tumor. LCIS and atypical ductalhyperplasia was noted on final pathology as well. No residual carcinoma was identified on final pathology. She completed radiation treatment in January 2017. She did not require adjuvant chemotherapy. She was started on tamoxifen in February 2017 but did not tolerate the medication well and stopped it after 6 months. She did not want to try another treatment with letrozole. She had hormone levels checked and her LH levels were 61.7 and FSH was 68.5. Estradiol levels were not checked.Her last regular menstrual cycle was in March 2018. She has had endometrial ablation treatment done 2 years ago and since then her periods have been minimal to irregular  patient has been off hormone therapy for over a year now. She does not wish to go back on tamoxifen. She still has on and off hot flashes which are not as bad as they were when she was on tamoxifen. She also reports baseline arthritis  LSH estradiol levels checked on 09/20/17 were consistent in the post menopausal range. Patient started arimidex in jan 2019 but stopped 2-3 months later due to arthralgias and mood swings   Interval  history- Patient reports having a menstrual cycle last month after not having had that in a long time. She is now off arimidex due to modd swings and arthralgias  ECOG PS- 0 Pain scale- 0   Review of systems- Review of Systems  Constitutional: Positive for malaise/fatigue. Negative for chills, fever and weight loss.  HENT: Negative for congestion, ear discharge and nosebleeds.   Eyes: Negative for blurred vision.  Respiratory: Negative for cough, hemoptysis, sputum production, shortness of breath and wheezing.   Cardiovascular: Negative for chest pain, palpitations, orthopnea and claudication.  Gastrointestinal: Negative for abdominal pain, blood in stool, constipation, diarrhea, heartburn, melena, nausea and vomiting.  Genitourinary: Negative for dysuria, flank pain, frequency, hematuria and urgency.  Musculoskeletal: Negative for back pain, joint pain and myalgias.  Skin: Negative for rash.  Neurological: Negative for dizziness, tingling, focal weakness, seizures, weakness and headaches.  Endo/Heme/Allergies: Does not bruise/bleed easily.  Psychiatric/Behavioral: Negative for depression and suicidal ideas. The patient does not have insomnia.       Allergies  Allergen Reactions  . Tape     SURGICAL TAPE AFTER BREAST BIOPSY     Past Medical History:  Diagnosis Date  . Anemia   . Arthritis   . Breast cancer ([place]) 08/2015   Radiation + right breast, T1,N0. ER/PR pos, Her 2 neg  . Headache   . Hypertension   . Personal history of radiation therapy   . Thyroid disease      Past Surgical History:  Procedure Laterality Date  . ANTERIOR CRUCIATE LIGAMENT REPAIR Right   . BREAST BIOPSY Right 07/17/2015   INVASIVE LOBULAR CARCINOMA, CLASSIC TYPE (  1.5 MM), ARISING IN A   . BREAST LUMPECTOMY WITH SENTINEL LYMPH NODE BIOPSY Right 08/22/2015   Procedure: BREAST LUMPECTOMY WITH SENTINEL LYMPH NODE BX;  Surgeon: Christene Lye, MD;  Location: [facility] ORS;  Service: General;   Laterality: Right;  . COLONOSCOPY W/ POLYPECTOMY  2014  . DILATION AND CURETTAGE OF UTERUS     20 years ago  . [place]      Social History   Socioeconomic History  . Marital status: Married    Spouse name: Not on file  . Number of children: Not on file  . Years of education: Not on file  . Highest education level: Not on file  Occupational History  . Not on file  Social Needs  . Financial resource strain: Not on file  . Food insecurity:    Worry: Not on file    Inability: Not on file  . Transportation needs:    Medical: Not on file    Non-medical: Not on file  Tobacco Use  . Smoking status: Current Every Day Smoker    Packs/day: 0.50    Years: 35.00    Pack years: 17.50    Types: Cigarettes  . Smokeless tobacco: Never Used  Substance and Sexual Activity  . Alcohol use: Yes    Alcohol/week: 1.2 oz    Types: 2 Standard drinks or equivalent per week    Comment: BEER 1-2 QD  . Drug use: No  . Sexual activity: Yes    Comment: ablation  Lifestyle  . Physical activity:    Days per week: Not on file    Minutes per session: Not on file  . Stress: Not on file  Relationships  . Social connections:    Talks on phone: Not on file    Gets together: Not on file    Attends religious service: Not on file    Active member of club or organization: Not on file    Attends meetings of clubs or organizations: Not on file    Relationship status: Not on file  . Intimate partner violence:    Fear of current or ex partner: Not on file    Emotionally abused: Not on file    Physically abused: Not on file    Forced sexual activity: Not on file  Other Topics Concern  . Not on file  Social History Narrative  . Not on file    Family History  Problem Relation Age of Onset  . Stroke Mother   . Cancer Sister        sarcoma on arm; chemo  . Diabetes Sister   . Stroke Sister   . Diabetes Brother   . Breast cancer Neg Hx   . Ovarian cancer Neg Hx   . Colon cancer Neg Hx     . Heart disease Neg Hx      Current Outpatient Medications:  .  allopurinol (ZYLOPRIM) 100 MG tablet, Take 100 mg by mouth at bedtime., Disp: , Rfl:  .  amLODipine (NORVASC) 10 MG tablet, Take 10 mg by mouth at bedtime. , Disp: , Rfl: 0 .  calcium-vitamin D (OSCAL WITH D) 500-200 MG-UNIT tablet, Take 1 tablet by mouth daily with breakfast., Disp: , Rfl:  .  losartan (COZAAR) 100 MG tablet, Take 100 mg by mouth at bedtime. , Disp: , Rfl: 0 .  metoprolol succinate (TOPROL-XL) 25 MG 24 hr tablet, Take 25 mg by mouth at bedtime. , Disp: , Rfl: 0 .  anastrozole (  ARIMIDEX) 1 MG tablet, Take 1 tablet (1 mg total) by mouth daily. (Patient not taking: Reported on 04/13/2018), Disp: 30 tablet, Rfl: 3  Physical exam:  Vitals:   04/13/18 0934  BP: (!) 149/97  Pulse: 92  Resp: 18  Temp: (!) 96.7 F (35.9 C)  TempSrc: Tympanic  Weight: 189 lb 3.2 oz (85.8 kg)  Height: '5\' 6"'  (1.676 m)   Physical Exam  Constitutional: She is oriented to person, place, and time. She appears well-developed and well-nourished.  HENT:  Head: Normocephalic and atraumatic.  Eyes: Pupils are equal, round, and reactive to light. EOM are normal.  Neck: Normal range of motion.  Cardiovascular: Normal rate, regular rhythm and normal heart sounds.  Pulmonary/Chest: Effort normal and breath sounds normal.  Abdominal: Soft. Bowel sounds are normal.  Neurological: She is alert and oriented to person, place, and time.  Skin: Skin is warm and dry.   Breast exam was performed in seated and lying down position. Patient is status post right lumpectomy with a well-healed surgical scar. No evidence of any palpable masses. No evidence of axillary adenopathy. No evidence of any palpable masses or lumps in the left breast. No evidence of leftt axillary adenopathy   CMP Latest Ref Rng & Units 12/07/2017  Glucose 65 - 99 mg/dL 90  BUN 6 - 20 mg/dL 16  Creatinine 0.44 - 1.00 mg/dL 0.77  Sodium 135 - 145 mmol/L 139  Potassium 3.5 -  5.1 mmol/L 3.8  Chloride 101 - 111 mmol/L 103  CO2 22 - 32 mmol/L 24  Calcium 8.9 - 10.3 mg/dL 9.8  Total Protein 6.5 - 8.1 g/dL 7.5  Total Bilirubin 0.3 - 1.2 mg/dL 0.4  Alkaline Phos 38 - 126 U/L 87  AST 15 - 41 U/L 18  ALT 14 - 54 U/L 9(L)   CBC Latest Ref Rng & Units 10/08/2015  WBC 3.6 - 11.0 K/uL 4.6  Hemoglobin 12.0 - 16.0 g/dL 13.7  Hematocrit 35.0 - 47.0 % 42.2  Platelets 150 - 440 K/uL 176    Assessment and plan- Patient is a 57 y.o. female female of stage Ia invasive lobular carcinoma of the right breast ER PR positive HER-2/neu negative status post lumpectomy and adjuvant radiation treatment. Patent took tamoxifen for a year then stopped due to side effects. She could not tolerate arimidex as well  Given that patient had a menstrual cycle a month ago despite post menopausal hormone levels, she is still pre menopausal and cannot be rechallenged with any other AI. She has not tolerated tamoxifen as well. She will therefore remain off endocrine therapy at this time and only remain on clinical surveillance.  Clinically she is doing well. No evidence of recurrence on todays exam. We will schedule mammogram in nov 2019   Visit Diagnosis 1. Malignant neoplasm of upper-outer quadrant of right breast in female, estrogen receptor positive ([place])   2. Encounter for follow-up surveillance of breast cancer      Dr. [name], MD, MPH [facility] [phone] 04/14/2018 1:21 PM

## 2018-09-11 ENCOUNTER — Ambulatory Visit
Admission: RE | Admit: 2018-09-11 | Discharge: 2018-09-11 | Disposition: A | Discharge status: Home / Self Care | Payer: 59 | Source: Ambulatory Visit | Attending: Oncology | Admitting: Oncology

## 2018-09-11 DIAGNOSIS — C50411 Malignant neoplasm of upper-outer quadrant of right female breast: Secondary | ICD-10-CM | POA: Insufficient documentation

## 2018-09-11 DIAGNOSIS — Z170 Estrogen receptor positive status [ER+]: Principal | ICD-10-CM

## 2018-09-19 ENCOUNTER — Ambulatory Visit: Payer: 59 | Admitting: General Surgery

## 2018-10-12 ENCOUNTER — Ambulatory Visit: Payer: 59 | Admitting: General Surgery

## 2018-10-13 ENCOUNTER — Ambulatory Visit: Payer: 59 | Admitting: Oncology

## 2018-11-06 ENCOUNTER — Inpatient Hospital Stay: Payer: 59 | Attending: Oncology | Admitting: Oncology

## 2018-11-07 ENCOUNTER — Ambulatory Visit: Payer: 59 | Admitting: General Surgery

## 2018-12-12 ENCOUNTER — Other Ambulatory Visit: Payer: Self-pay

## 2018-12-12 ENCOUNTER — Ambulatory Visit (INDEPENDENT_AMBULATORY_CARE_PROVIDER_SITE_OTHER): Payer: 59 | Admitting: General Surgery

## 2018-12-12 ENCOUNTER — Encounter: Payer: Self-pay | Admitting: General Surgery

## 2018-12-12 VITALS — BP 138/78 | HR 83 | Resp 12 | Ht 66.0 in | Wt 189.0 lb

## 2018-12-12 DIAGNOSIS — C50911 Malignant neoplasm of unspecified site of right female breast: Secondary | ICD-10-CM | POA: Diagnosis not present

## 2018-12-12 NOTE — Progress Notes (Signed)
Patient ID: Michelle Walker, female   DOB: 1960-12-03, 58 y.o.   MRN: [id number]  Chief Complaint  Patient presents with  . Follow-up    f/u bil diag mammo [facility] 09/11/18(Dr Janese Banks)    HPI Michelle Walker is a 58 y.o. female.  who presents for her right breast cancer and a breast evaluation, former patient of Dr Jamal Collin. The most recent mammogram was done on 09-11-18.  Patient does perform regular self breast checks and gets regular mammograms done.   She states she was not able to tolerate the Arimidex due to the hot flashes and irritability and stopped it around January 2019.  HPI  Past Medical History:  Diagnosis Date  . Anemia   . Arthritis   . Breast cancer ([place]) 08/2015   Radiation + right breast, T1,N0. ER/PR pos, Her 2 neg  . Headache   . Hypertension   . Personal history of radiation therapy   . Thyroid disease     Past Surgical History:  Procedure Laterality Date  . ANTERIOR CRUCIATE LIGAMENT REPAIR Right   . BREAST BIOPSY Right 07/17/2015   INVASIVE LOBULAR CARCINOMA, CLASSIC TYPE (1.5 MM), ARISING IN A   . BREAST LUMPECTOMY Right 08/22/2015  . BREAST LUMPECTOMY WITH SENTINEL LYMPH NODE BIOPSY Right 08/22/2015   Procedure: BREAST LUMPECTOMY WITH SENTINEL LYMPH NODE BX;  Surgeon: Christene Lye, MD;  Location: [facility] ORS;  Service: General;  Laterality: Right;  . COLONOSCOPY W/ POLYPECTOMY  2014  . DILATION AND CURETTAGE OF UTERUS     20 years ago  . NOVASURE ABLATION      Family History  Problem Relation Age of Onset  . Stroke Mother   . Cancer Sister        sarcoma on arm; chemo  . Diabetes Sister   . Stroke Sister   . Diabetes Brother   . Breast cancer Neg Hx   . Ovarian cancer Neg Hx   . Colon cancer Neg Hx   . Heart disease Neg Hx     Social History Social History   Tobacco Use  . Smoking status: Current Every Day Smoker    Packs/day: 0.50    Years: 35.00    Pack years: 17.50    Types: Cigarettes  . Smokeless tobacco: Never Used  Substance  Use Topics  . Alcohol use: Yes    Alcohol/week: 2.0 standard drinks    Types: 2 Standard drinks or equivalent per week    Comment: BEER 1-2 QD  . Drug use: No    Allergies  Allergen Reactions  . Tape     SURGICAL TAPE AFTER BREAST BIOPSY    Current Outpatient Medications  Medication Sig Dispense Refill  . allopurinol (ZYLOPRIM) 100 MG tablet Take 100 mg by mouth at bedtime.    [name] amLODipine (NORVASC) 10 MG tablet Take 10 mg by mouth at bedtime.   0  . calcium-vitamin D (OSCAL WITH D) 500-200 MG-UNIT tablet Take 1 tablet by mouth daily with breakfast.    . losartan (COZAAR) 100 MG tablet Take 100 mg by mouth at bedtime.   0  . metoprolol succinate (TOPROL-XL) 25 MG 24 hr tablet Take 25 mg by mouth at bedtime.   0   No current facility-administered medications for this visit.     Review of Systems Review of Systems  Constitutional: Negative.   Respiratory: Negative.   Cardiovascular: Negative.     Blood pressure 138/78, pulse 83, resp. rate 12, height 5\' 6"  (1.676  m), weight 189 lb (85.7 kg), SpO2 97 %.  Physical Exam Physical Exam Exam conducted with a chaperone present.  Constitutional:      Appearance: Normal appearance.  Eyes:     Pupils: Pupils are equal, round, and reactive to light.  Neck:     Musculoskeletal: Normal range of motion and neck supple.  Cardiovascular:     Rate and Rhythm: Normal rate and regular rhythm.     Heart sounds: Normal heart sounds.  Pulmonary:     Effort: Pulmonary effort is normal.     Breath sounds: Normal breath sounds.  Chest:    Lymphadenopathy:     Cervical: No cervical adenopathy.     Upper Body:     Right upper body: No supraclavicular or axillary adenopathy.     Left upper body: No supraclavicular or axillary adenopathy.  Neurological:     Mental Status: She is alert.     Data Reviewed Bilateral diagnostic mammograms dated September 11, 2018 were reviewed.  Postsurgical changes.  BI-RADS-2.  Assessment    No  evidence of recurrent disease.    Plan  We will arrange for a follow-up examination at the end of 2020 to keep in line with her scheduled mammograms.  Patient made use of tamoxifen for 6 months and discontinued secondary to vasomotor symptoms.  Declined restart.  Aware of slight increased risk of recurrence without estrogen blockade.  This is been reviewed with the patient by Dr. Janese Banks.    HPI, assessment, plan and physical exam has been scribed under the direction and in the presence of Robert Bellow, MD. Karie Fetch, RN     I have completed the exam and reviewed the above documentation for accuracy and completeness.  I agree with the above.  Haematologist has been used and any errors in dictation or transcription are unintentional.  Hervey Ard, M.D., F.A.C.S.    Michelle Walker 12/13/2018, 7:03 PM

## 2018-12-12 NOTE — Patient Instructions (Addendum)
[facility];  [address] [place] [address]  [employer] [facility] [address] [place] [address]

## 2018-12-13 ENCOUNTER — Encounter: Payer: Self-pay | Admitting: General Surgery

## 2019-02-07 ENCOUNTER — Telehealth: Payer: Self-pay

## 2019-02-07 NOTE — Telephone Encounter (Signed)
LVM informing patient that her colonoscopy is due in June 2020 with Dr. Allen Norris and to contact office to schedule.  Thanks [employer]

## 2019-02-20 ENCOUNTER — Encounter: Payer: Self-pay | Admitting: Gastroenterology

## 2019-02-26 ENCOUNTER — Encounter: Payer: Self-pay | Admitting: *Deleted

## 2019-03-14 ENCOUNTER — Telehealth: Payer: Self-pay | Admitting: Gastroenterology

## 2019-03-14 NOTE — Telephone Encounter (Signed)
Pt is calling to schedule procedure for a Friday  She is aware Sharyn Lull will call her from a Private call

## 2019-03-15 NOTE — Telephone Encounter (Signed)
Unable to contact patient to schedule her colonoscopy.  Her voicemail is currently full.  Thanks [employer]

## 2019-04-05 ENCOUNTER — Other Ambulatory Visit: Payer: Self-pay

## 2019-04-05 DIAGNOSIS — Z86010 Personal history of colonic polyps: Secondary | ICD-10-CM

## 2019-04-10 ENCOUNTER — Other Ambulatory Visit: Payer: Self-pay

## 2019-04-10 ENCOUNTER — Encounter: Payer: Self-pay | Admitting: *Deleted

## 2019-04-12 ENCOUNTER — Other Ambulatory Visit
Admission: RE | Admit: 2019-04-12 | Discharge: 2019-04-12 | Disposition: A | Discharge status: Home / Self Care | Payer: 59 | Source: Ambulatory Visit | Attending: Gastroenterology | Admitting: Gastroenterology

## 2019-04-12 ENCOUNTER — Other Ambulatory Visit: Payer: Self-pay

## 2019-04-12 ENCOUNTER — Telehealth: Payer: Self-pay

## 2019-04-12 DIAGNOSIS — Z1159 Encounter for screening for other viral diseases: Secondary | ICD-10-CM | POA: Insufficient documentation

## 2019-04-13 ENCOUNTER — Other Ambulatory Visit: Payer: Self-pay

## 2019-04-13 LAB — NOVEL CORONAVIRUS, NAA (HOSP ORDER, SEND-OUT TO REF LAB; TAT 18-24 HRS): SARS-CoV-2, NAA: NOT DETECTED

## 2019-04-13 NOTE — Discharge Instructions (Signed)
General Anesthesia, Adult, Care After  This sheet gives you information about how to care for yourself after your procedure. Your health care provider may also give you more specific instructions. If you have problems or questions, contact your health care provider.  What can I expect after the procedure?  After the procedure, the following side effects are common:  Pain or discomfort at the IV site.  Nausea.  Vomiting.  Sore throat.  Trouble concentrating.  Feeling cold or chills.  Weak or tired.  Sleepiness and fatigue.  Soreness and body aches. These side effects can affect parts of the body that were not involved in surgery.  Follow these instructions at home:    For at least 24 hours after the procedure:  Have a responsible adult stay with you. It is important to have someone help care for you until you are awake and alert.  Rest as needed.  Do not:  Participate in activities in which you could fall or become injured.  Drive.  Use heavy machinery.  Drink alcohol.  Take sleeping pills or medicines that cause drowsiness.  Make important decisions or sign legal documents.  Take care of children on your own.  Eating and drinking  Follow any instructions from your health care provider about eating or drinking restrictions.  When you feel hungry, start by eating small amounts of foods that are soft and easy to digest (bland), such as toast. Gradually return to your regular diet.  Drink enough fluid to keep your urine pale yellow.  If you vomit, rehydrate by drinking water, juice, or clear broth.  General instructions  If you have sleep apnea, surgery and certain medicines can increase your risk for breathing problems. Follow instructions from your health care provider about wearing your sleep device:  Anytime you are sleeping, including during daytime naps.  While taking prescription pain medicines, sleeping medicines, or medicines that make you drowsy.  Return to your normal activities as told by your health care  provider. Ask your health care provider what activities are safe for you.  Take over-the-counter and prescription medicines only as told by your health care provider.  If you smoke, do not smoke without supervision.  Keep all follow-up visits as told by your health care provider. This is important.  Contact a health care provider if:  You have nausea or vomiting that does not get better with medicine.  You cannot eat or drink without vomiting.  You have pain that does not get better with medicine.  You are unable to pass urine.  You develop a skin rash.  You have a fever.  You have redness around your IV site that gets worse.  Get help right away if:  You have difficulty breathing.  You have chest pain.  You have blood in your urine or stool, or you vomit blood.  Summary  After the procedure, it is common to have a sore throat or nausea. It is also common to feel tired.  Have a responsible adult stay with you for the first 24 hours after general anesthesia. It is important to have someone help care for you until you are awake and alert.  When you feel hungry, start by eating small amounts of foods that are soft and easy to digest (bland), such as toast. Gradually return to your regular diet.  Drink enough fluid to keep your urine pale yellow.  Return to your normal activities as told by your health care provider. Ask your health care   provider what activities are safe for you.  This information is not intended to replace advice given to you by your health care provider. Make sure you discuss any questions you have with your health care provider.  Document Released: 01/24/2001 Document Revised: 06/03/2017 Document Reviewed: 06/03/2017  Elsevier Interactive Patient Education  2019 [employer] Inc[employer]

## 2019-04-16 ENCOUNTER — Ambulatory Visit: Payer: 59 | Admitting: Anesthesiology

## 2019-04-16 ENCOUNTER — Ambulatory Visit
Admission: RE | Admit: 2019-04-16 | Discharge: 2019-04-16 | Disposition: A | Discharge status: Home / Self Care | Payer: 59 | Attending: Gastroenterology | Admitting: Gastroenterology

## 2019-04-16 ENCOUNTER — Other Ambulatory Visit: Payer: Self-pay

## 2019-04-16 ENCOUNTER — Encounter
Admission: RE | Disposition: A | Discharge status: Home / Self Care | Payer: Self-pay | Source: Home / Self Care | Attending: Gastroenterology

## 2019-04-16 DIAGNOSIS — F329 Major depressive disorder, single episode, unspecified: Secondary | ICD-10-CM | POA: Diagnosis not present

## 2019-04-16 DIAGNOSIS — D649 Anemia, unspecified: Secondary | ICD-10-CM | POA: Insufficient documentation

## 2019-04-16 DIAGNOSIS — Z833 Family history of diabetes mellitus: Secondary | ICD-10-CM | POA: Diagnosis not present

## 2019-04-16 DIAGNOSIS — D125 Benign neoplasm of sigmoid colon: Secondary | ICD-10-CM | POA: Diagnosis not present

## 2019-04-16 DIAGNOSIS — Z853 Personal history of malignant neoplasm of breast: Secondary | ICD-10-CM | POA: Insufficient documentation

## 2019-04-16 DIAGNOSIS — E669 Obesity, unspecified: Secondary | ICD-10-CM | POA: Insufficient documentation

## 2019-04-16 DIAGNOSIS — K641 Second degree hemorrhoids: Secondary | ICD-10-CM | POA: Diagnosis not present

## 2019-04-16 DIAGNOSIS — Z6829 Body mass index (BMI) 29.0-29.9, adult: Secondary | ICD-10-CM | POA: Diagnosis not present

## 2019-04-16 DIAGNOSIS — E079 Disorder of thyroid, unspecified: Secondary | ICD-10-CM | POA: Diagnosis not present

## 2019-04-16 DIAGNOSIS — F17210 Nicotine dependence, cigarettes, uncomplicated: Secondary | ICD-10-CM | POA: Diagnosis not present

## 2019-04-16 DIAGNOSIS — I10 Essential (primary) hypertension: Secondary | ICD-10-CM | POA: Insufficient documentation

## 2019-04-16 DIAGNOSIS — Z923 Personal history of irradiation: Secondary | ICD-10-CM | POA: Diagnosis not present

## 2019-04-16 DIAGNOSIS — Z1211 Encounter for screening for malignant neoplasm of colon: Secondary | ICD-10-CM | POA: Insufficient documentation

## 2019-04-16 DIAGNOSIS — K5730 Diverticulosis of large intestine without perforation or abscess without bleeding: Secondary | ICD-10-CM | POA: Insufficient documentation

## 2019-04-16 DIAGNOSIS — D122 Benign neoplasm of ascending colon: Secondary | ICD-10-CM | POA: Diagnosis not present

## 2019-04-16 DIAGNOSIS — K635 Polyp of colon: Secondary | ICD-10-CM

## 2019-04-16 DIAGNOSIS — Z809 Family history of malignant neoplasm, unspecified: Secondary | ICD-10-CM | POA: Diagnosis not present

## 2019-04-16 DIAGNOSIS — R51 Headache: Secondary | ICD-10-CM | POA: Diagnosis not present

## 2019-04-16 DIAGNOSIS — Z823 Family history of stroke: Secondary | ICD-10-CM | POA: Insufficient documentation

## 2019-04-16 DIAGNOSIS — E119 Type 2 diabetes mellitus without complications: Secondary | ICD-10-CM | POA: Diagnosis not present

## 2019-04-16 DIAGNOSIS — M1990 Unspecified osteoarthritis, unspecified site: Secondary | ICD-10-CM | POA: Insufficient documentation

## 2019-04-16 DIAGNOSIS — Z79899 Other long term (current) drug therapy: Secondary | ICD-10-CM | POA: Diagnosis not present

## 2019-04-16 DIAGNOSIS — Z86010 Personal history of colonic polyps: Secondary | ICD-10-CM | POA: Insufficient documentation

## 2019-04-16 HISTORY — PX: POLYPECTOMY: SHX5525

## 2019-04-16 HISTORY — PX: COLONOSCOPY WITH PROPOFOL: SHX5780

## 2019-04-16 LAB — POCT PREGNANCY, URINE: Preg Test, Ur: NEGATIVE

## 2019-04-16 SURGERY — COLONOSCOPY WITH PROPOFOL
Anesthesia: General | Site: Rectum

## 2019-04-16 MED ORDER — PROPOFOL 10 MG/ML IV BOLUS
INTRAVENOUS | Status: DC | PRN
  Administered 2019-04-16: 30 mg via INTRAVENOUS
  Administered 2019-04-16: 20 mg via INTRAVENOUS
  Administered 2019-04-16: 50 mg via INTRAVENOUS
  Administered 2019-04-16: 20 mg via INTRAVENOUS
  Administered 2019-04-16: 30 mg via INTRAVENOUS
  Administered 2019-04-16: 100 mg via INTRAVENOUS
  Administered 2019-04-16: 50 mg via INTRAVENOUS
  Administered 2019-04-16: 20 mg via INTRAVENOUS
  Administered 2019-04-16: 50 mg via INTRAVENOUS
  Administered 2019-04-16: 30 mg via INTRAVENOUS
  Administered 2019-04-16: 50 mg via INTRAVENOUS

## 2019-04-16 MED ORDER — LIDOCAINE HCL (CARDIAC) PF 100 MG/5ML IV SOSY
PREFILLED_SYRINGE | INTRAVENOUS | Status: DC | PRN
  Administered 2019-04-16: 40 mg via INTRAVENOUS

## 2019-04-16 MED ORDER — STERILE WATER FOR IRRIGATION IR SOLN
Status: DC | PRN
  Administered 2019-04-16: 15 mL

## 2019-04-16 MED ORDER — LACTATED RINGERS IV SOLN
INTRAVENOUS | Status: DC
  Administered 2019-04-16: 09:00:00 via INTRAVENOUS

## 2019-04-16 MED ORDER — SODIUM CHLORIDE 0.9 % IV SOLN: INTRAVENOUS | Status: DC

## 2019-04-16 MED ORDER — ONDANSETRON HCL 4 MG/2ML IJ SOLN: 4.0000 mg | Freq: Once | INTRAMUSCULAR | Status: DC | PRN

## 2019-04-16 SURGICAL SUPPLY — 7 items
CANISTER SUCT 1200ML W/VALVE (MISCELLANEOUS) ×2 IMPLANT
GOWN CVR UNV OPN BCK APRN NK (MISCELLANEOUS) ×2 IMPLANT
GOWN ISOL THUMB LOOP REG UNIV (MISCELLANEOUS) ×2
KIT ENDO PROCEDURE OLY (KITS) ×2 IMPLANT
SNARE SHORT THROW 13M SML OVAL (MISCELLANEOUS) ×2 IMPLANT
TRAP ETRAP POLY (MISCELLANEOUS) ×2 IMPLANT
WATER STERILE IRR 250ML POUR (IV SOLUTION) ×2 IMPLANT

## 2019-04-16 NOTE — Anesthesia Postprocedure Evaluation (Signed)
Anesthesia Post Note  Patient: Michelle Walker  Procedure(s) Performed: COLONOSCOPY WITH BIOPSIES (N/A Rectum) POLYPECTOMY (N/A Rectum)  Patient location during evaluation: PACU Anesthesia Type: General Level of consciousness: awake Pain management: pain level controlled Vital Signs Assessment: post-procedure vital signs reviewed and stable Respiratory status: respiratory function stable Cardiovascular status: stable Postop Assessment: no signs of nausea or vomiting Anesthetic complications: no    Veda Canning

## 2019-04-16 NOTE — H&P (Signed)
Lucilla Lame, MD [facility] [address] [place], [place] [address] Phone:[phone] Fax : [phone]  Primary Care Physician:  Casilda Carls, MD Primary Gastroenterologist:  Dr. Allen Norris  Pre-Procedure History & Physical: HPI:  Michelle Walker is a 58 y.o. female is here for an colonoscopy.   Past Medical History:  Diagnosis Date  . Anemia   . Arthritis   . Breast cancer ([place]) 08/2015   1.5 mm invasive lobular cancer, 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 Wide excision/ RT, T1a,N0. ER/PR pos, Her 2.neg. Tamoxifen x 6 months, d/c secondary to vasomotor sympotms.   . Headache   . Hypertension   . Personal history of radiation therapy   . Thyroid disease     Past Surgical History:  Procedure Laterality Date  . ANTERIOR CRUCIATE LIGAMENT REPAIR Right   . BREAST BIOPSY Right 07/17/2015   INVASIVE LOBULAR CARCINOMA, CLASSIC TYPE (1.5 MM), ARISING IN A   . BREAST LUMPECTOMY Right 08/22/2015  . BREAST LUMPECTOMY WITH SENTINEL LYMPH NODE BIOPSY Right 08/22/2015   Procedure: BREAST LUMPECTOMY WITH SENTINEL LYMPH NODE BX;  Surgeon: Christene Lye, MD;  Location: [facility] ORS;  Service: General;  Laterality: Right;  . COLONOSCOPY W/ POLYPECTOMY  2014  . DILATION AND CURETTAGE OF UTERUS     20 years ago  . NOVASURE ABLATION      Prior to Admission medications   Medication Sig Start Date End Date Taking? Authorizing Provider  allopurinol (ZYLOPRIM) 100 MG tablet Take 100 mg by mouth at bedtime.   Yes [provider]  amLODipine (NORVASC) 10 MG tablet Take 10 mg by mouth at bedtime.    Yes [provider]  calcium-vitamin D (OSCAL WITH D) 500-200 MG-UNIT tablet Take 1 tablet by mouth daily with breakfast.   Yes [provider]  losartan (COZAAR) 100 MG tablet Take 100 mg by mouth at bedtime.    Yes [provider]  metoprolol succinate (TOPROL-XL) 25 MG 24 hr tablet Take 25 mg by mouth at bedtime.    Yes [provider]    Allergies as of  04/05/2019 - Review Complete 12/12/2018  Allergen Reaction Noted  . Tape  08/18/2015    Family History  Problem Relation Age of Onset  . Stroke Mother   . Cancer Sister        sarcoma on arm; chemo  . Diabetes Sister   . Stroke Sister   . Diabetes Brother   . Breast cancer Neg Hx   . Ovarian cancer Neg Hx   . Colon cancer Neg Hx   . Heart disease Neg Hx     Social History   Socioeconomic History  . Marital status: Married    Spouse name: Not on file  . Number of children: Not on file  . Years of education: Not on file  . Highest education level: Not on file  Occupational History  . Not on file  Social Needs  . Financial resource strain: Not on file  . Food insecurity    Worry: Not on file    Inability: Not on file  . Transportation needs    Medical: Not on file    Non-medical: Not on file  Tobacco Use  . Smoking status: Current Every Day Smoker    Packs/day: 0.50    Years: 40.00    Pack years: 20.00    Types: Cigarettes  . Smokeless tobacco: Never Used  Substance and Sexual Activity  . Alcohol use: Yes    Alcohol/week: 2.0  standard drinks    Types: 2 Standard drinks or equivalent per week    Comment: BEER 1-2 QD  . Drug use: No  . Sexual activity: Yes    Comment: ablation  Lifestyle  . Physical activity    Days per week: Not on file    Minutes per session: Not on file  . Stress: Not on file  Relationships  . Social Herbalist on phone: Not on file    Gets together: Not on file    Attends religious service: Not on file    Active member of club or organization: Not on file    Attends meetings of clubs or organizations: Not on file    Relationship status: Not on file  . Intimate partner violence    Fear of current or ex partner: Not on file    Emotionally abused: Not on file    Physically abused: Not on file    Forced sexual activity: Not on file  Other Topics Concern  . Not on file  Social History Narrative  . Not on file    Review  of Systems: See HPI, otherwise negative ROS  Physical Exam: BP (!) 144/97   Pulse 94   Temp 98.1 F (36.7 C) (Temporal)   Ht 5\' 6"  (1.676 m)   Wt 82.1 kg   SpO2 100%   BMI 29.21 kg/m  General:   Alert,  pleasant and cooperative in NAD Head:  Normocephalic and atraumatic. Neck:  Supple; no masses or thyromegaly. Lungs:  Clear throughout to auscultation.    Heart:  Regular rate and rhythm. Abdomen:  Soft, nontender and nondistended. Normal bowel sounds, without guarding, and without rebound.   Neurologic:  Alert and  oriented x4;  grossly normal neurologically.  Impression/Plan: Michelle Walker is here for an colonoscopy to be performed for history of colon polyps 04/15/2014  Risks, benefits, limitations, and alternatives regarding  colonoscopy have been reviewed with the patient.  Questions have been answered.  All parties agreeable.   Lucilla Lame, MD  04/16/2019, 9:15 AM

## 2019-04-16 NOTE — Anesthesia Procedure Notes (Signed)
Procedure Name: MAC Date/Time: 04/16/2019 9:42 AM Performed by: Janna Arch, CRNA Pre-anesthesia Checklist: Patient identified, Emergency Drugs available, Suction available, Timeout performed and Patient being monitored Patient Re-evaluated:Patient Re-evaluated prior to induction Oxygen Delivery Method: Nasal cannula Placement Confirmation: positive ETCO2

## 2019-04-16 NOTE — Op Note (Signed)
[facility] Patient Name: Michelle Walker Procedure Date: 04/16/2019 9:35 AM MRN: [id number] Account #: [id number] Date of Birth: [birth year] Admit Type: Outpatient Age: 58 Room: [facility] OR ROOM 01 Gender: Female Note Status: Finalized Procedure:            Colonoscopy Indications:          High risk colon cancer surveillance: Personal history                        of colonic polyps Providers:            Lucilla Lame MD, MD Referring MD:         Casilda Carls, MD (Referring MD) Medicines:            Propofol per Anesthesia Complications:        No immediate complications. Procedure:            Pre-Anesthesia Assessment:                       - Prior to the procedure, a History and Physical was                        performed, and patient medications and allergies were                        reviewed. The patient's tolerance of previous                        anesthesia was also reviewed. The risks and benefits of                        the procedure and the sedation options and risks were                        discussed with the patient. All questions were                        answered, and informed consent was obtained. Prior                        Anticoagulants: The patient has taken no previous                        anticoagulant or antiplatelet agents. ASA Grade                        Assessment: II - A patient with mild systemic disease.                        After reviewing the risks and benefits, the patient was                        deemed in satisfactory condition to undergo the                        procedure.                       After obtaining informed consent, the colonoscope was  passed under direct vision. Throughout the procedure,                        the patient's blood pressure, pulse, and oxygen                        saturations were monitored continuously. The was   introduced through the anus and advanced to the the                        cecum, identified by appendiceal orifice and ileocecal                        valve. The colonoscopy was performed without                        difficulty. The patient tolerated the procedure well.                        The quality of the bowel preparation was excellent. Findings:      The perianal and digital rectal examinations were normal.      A 4 mm polyp was found in the ascending colon. The polyp was sessile.       The polyp was removed with a cold snare. Resection and retrieval were       complete.      A 5 mm polyp was found in the sigmoid colon. The polyp was sessile. The       polyp was removed with a cold snare. Resection and retrieval were       complete.      Multiple small-mouthed diverticula were found in the entire colon.      Non-bleeding internal hemorrhoids were found during retroflexion. The       hemorrhoids were Grade II (internal hemorrhoids that prolapse but reduce       spontaneously). Impression:           - One 4 mm polyp in the ascending colon, removed with a                        cold snare. Resected and retrieved.                       - One 5 mm polyp in the sigmoid colon, removed with a                        cold snare. Resected and retrieved.                       - Diverticulosis in the entire examined colon.                       - Non-bleeding internal hemorrhoids. Recommendation:       - Discharge patient to home.                       - Resume previous diet.                       - Continue present medications.                       -  Await pathology results.                       - Repeat colonoscopy in 5 years for surveillance. Procedure Code(s):    --- Professional ---                       [phone], Colonoscopy, flexible; with removal of tumor(s),                        polyp(s), or other lesion(s) by snare technique Diagnosis Code(s):    --- Professional ---                        Z86.010, Personal history of colonic polyps                       K63.5, Polyp of colon CPT copyright 2019 American Medical Association. All rights reserved. The codes documented in this report are preliminary and upon coder review may  be revised to meet current compliance requirements. Lucilla Lame MD, MD 04/16/2019 10:09:42 AM This report has been signed electronically. Number of Addenda: 0 Note Initiated On: 04/16/2019 9:35 AM Scope Withdrawal Time: 0 hours 7 minutes 44 seconds  Total Procedure Duration: 0 hours 14 minutes 39 seconds  Estimated Blood Loss: Estimated blood loss: none.      [facility]

## 2019-04-16 NOTE — Transfer of Care (Signed)
Immediate Anesthesia Transfer of Care Note  Patient: Michelle Walker  Procedure(s) Performed: COLONOSCOPY WITH BIOPSIES (N/A Rectum) POLYPECTOMY (N/A Rectum)  Patient Location: PACU  Anesthesia Type: General  Level of Consciousness: awake, alert  and patient cooperative  Airway and Oxygen Therapy: Patient Spontanous Breathing and Patient connected to supplemental oxygen  Post-op Assessment: Post-op Vital signs reviewed, Patient's Cardiovascular Status Stable, Respiratory Function Stable, Patent Airway and No signs of Nausea or vomiting  Post-op Vital Signs: Reviewed and stable  Complications: No apparent anesthesia complications

## 2019-04-16 NOTE — Anesthesia Preprocedure Evaluation (Signed)
Anesthesia Evaluation  Patient identified by MRN, date of birth, ID band Patient awake    Reviewed: Allergy & Precautions, NPO status , Patient's Chart, lab work & pertinent test results  Airway Mallampati: II  TM Distance: >3 FB     Dental   Pulmonary Current Smoker,    breath sounds clear to auscultation       Cardiovascular hypertension,  Rhythm:Regular Rate:Normal     Neuro/Psych  Headaches, Depression    GI/Hepatic   Endo/Other  diabetes, Type 2Obesity - BMI 32  Renal/GU      Musculoskeletal  (+) Arthritis ,   Abdominal   Peds  Hematology   Anesthesia Other Findings Hx breast cancer  Reproductive/Obstetrics                             Anesthesia Physical Anesthesia Plan  ASA: III  Anesthesia Plan: General   Post-op Pain Management:    Induction: Intravenous  PONV Risk Score and Plan:   Airway Management Planned: Natural Airway and Nasal Cannula  Additional Equipment:   Intra-op Plan:   Post-operative Plan:   Informed Consent: I have reviewed the patients History and Physical, chart, labs and discussed the procedure including the risks, benefits and alternatives for the proposed anesthesia with the patient or authorized representative who has indicated his/her understanding and acceptance.       Plan Discussed with: CRNA  Anesthesia Plan Comments:         Anesthesia Quick Evaluation

## 2019-04-17 ENCOUNTER — Encounter: Payer: Self-pay | Admitting: Gastroenterology

## 2019-04-18 ENCOUNTER — Encounter: Payer: Self-pay | Admitting: Gastroenterology

## 2019-04-18 NOTE — Telephone Encounter (Signed)
error 

## 2019-05-14 ENCOUNTER — Telehealth: Payer: Self-pay | Admitting: Gastroenterology

## 2019-05-14 NOTE — Telephone Encounter (Signed)
Patient called & thinks her diverticulitis has flare up. Please advise.

## 2019-05-14 NOTE — Telephone Encounter (Signed)
Pt will need an appt. She hasn't seen Dr. Allen Norris in 5 years and that was for a colonoscopy. She can also call her PCP if she needs to get in sooner.

## 2019-05-17 NOTE — Telephone Encounter (Signed)
I CALLED & L/M FOR PATIENT  (SEE GINGER'S MESSAGE)

## 2019-05-23 ENCOUNTER — Encounter: Payer: Self-pay | Admitting: General Surgery

## 2019-06-25 ENCOUNTER — Encounter (INDEPENDENT_AMBULATORY_CARE_PROVIDER_SITE_OTHER): Payer: Self-pay

## 2019-06-25 ENCOUNTER — Encounter: Payer: Self-pay | Admitting: Gastroenterology

## 2019-06-25 ENCOUNTER — Other Ambulatory Visit: Payer: Self-pay

## 2019-06-25 ENCOUNTER — Ambulatory Visit (INDEPENDENT_AMBULATORY_CARE_PROVIDER_SITE_OTHER): Payer: 59 | Admitting: Gastroenterology

## 2019-06-25 VITALS — BP 161/97 | HR 87 | Temp 97.5°F | Ht 66.0 in | Wt 183.8 lb

## 2019-06-25 DIAGNOSIS — K5730 Diverticulosis of large intestine without perforation or abscess without bleeding: Secondary | ICD-10-CM | POA: Diagnosis not present

## 2019-06-25 DIAGNOSIS — R1084 Generalized abdominal pain: Secondary | ICD-10-CM

## 2019-06-25 NOTE — Progress Notes (Signed)
Primary Care Physician: Casilda Carls, MD  Primary Gastroenterologist:  Dr. Lucilla Lame  Chief Complaint  Patient presents with  . history of diverticulitis    HPI: Michelle Walker is a 58 y.o. female here who comes here for follow-up after having her colonoscopy.  The patient was found to have diverticulosis throughout her entire colon.  The patient thinks that she has been having attacks of diverticulitis although she does not have any fevers or chills.  She does report some abdominal pain that was diffuse on both sides and lasted for about 5 days.  The patient did not seek help nor did she have a CT scan.  The patient was not been on antibiotics and the symptoms resolved on their own.  The patient denies any abdominal pain at the present time.  There is no report of any black stools or bloody stools.  Current Outpatient Medications  Medication Sig Dispense Refill  . allopurinol (ZYLOPRIM) 100 MG tablet Take 100 mg by mouth at bedtime.    [name] amLODipine (NORVASC) 10 MG tablet Take 10 mg by mouth at bedtime.   0  . calcium-vitamin D (OSCAL WITH D) 500-200 MG-UNIT tablet Take 1 tablet by mouth daily with breakfast.    . losartan (COZAAR) 100 MG tablet Take 100 mg by mouth at bedtime.   0  . metoprolol succinate (TOPROL-XL) 25 MG 24 hr tablet Take 25 mg by mouth at bedtime.   0   No current facility-administered medications for this visit.     Allergies as of 06/25/2019 - Review Complete 06/25/2019  Allergen Reaction Noted  . Tape Rash 08/18/2015    ROS:  General: Negative for anorexia, weight loss, fever, chills, fatigue, weakness. ENT: Negative for hoarseness, difficulty swallowing , nasal congestion. CV: Negative for chest pain, angina, palpitations, dyspnea on exertion, peripheral edema.  Respiratory: Negative for dyspnea at rest, dyspnea on exertion, cough, sputum, wheezing.  GI: See history of present illness. GU:  Negative for dysuria, hematuria, urinary incontinence,  urinary frequency, nocturnal urination.  Endo: Negative for unusual weight change.    Physical Examination:   BP (!) 161/97   Pulse 87   Temp (!) 97.5 F (36.4 C) (Temporal)   Ht 5\' 6"  (1.676 m)   Wt 183 lb 12.8 oz (83.4 kg)   BMI 29.67 kg/m   General: Well-nourished, well-developed in no acute distress.  Eyes: No icterus. Conjunctivae pink. Mouth: Oropharyngeal mucosa moist and pink , no lesions erythema or exudate. Lungs: Clear to auscultation bilaterally. Non-labored. Heart: Regular rate and rhythm, no murmurs rubs or gallops.  Abdomen: Bowel sounds are normal, nontender, nondistended, no hepatosplenomegaly or masses, no abdominal bruits or hernia , no rebound or guarding.   Extremities: No lower extremity edema. No clubbing or deformities. Neuro: Alert and oriented x 3.  Grossly intact. Skin: Warm and dry, no jaundice.   Psych: Alert and cooperative, normal mood and affect.  Labs:    Imaging Studies: No results found.  Assessment and Plan:   Michelle Walker is a 58 y.o. y/o female who reports intermittent attacks of abdominal pain that she thinks may be related to her diverticulosis although she does not have overt symptoms of diverticulitis when she has these attacks.  Patient has been told that when she gets these attacks again that she should undergo a CT scan to document that she is actually having diverticulitis.  She is also been told that since her diverticulosis is throughout the entire colon  surgery is not an easy option for her.  The patient has also been told that her polyp was adenomatous and reminded to have a repeat colonoscopy in 5 years.  The patient has been explained the plan and agrees with it.    Lucilla Lame, MD. Marval Regal   Note: This dictation was prepared with Dragon dictation along with smaller phrase technology. Any transcriptional errors that result from this process are unintentional.

## 2019-07-16 ENCOUNTER — Other Ambulatory Visit: Payer: Self-pay

## 2019-07-16 DIAGNOSIS — C50911 Malignant neoplasm of unspecified site of right female breast: Secondary | ICD-10-CM

## 2019-09-13 ENCOUNTER — Ambulatory Visit
Admission: RE | Admit: 2019-09-13 | Discharge: 2019-09-13 | Disposition: A | Discharge status: Home / Self Care | Payer: 59 | Source: Ambulatory Visit | Attending: Surgery | Admitting: Surgery

## 2019-09-13 DIAGNOSIS — C50911 Malignant neoplasm of unspecified site of right female breast: Secondary | ICD-10-CM

## 2019-09-17 ENCOUNTER — Telehealth: Payer: Self-pay

## 2019-09-17 NOTE — Telephone Encounter (Signed)
Patient notified of mammogram results and reminded of follow up appointment.

## 2019-09-19 ENCOUNTER — Ambulatory Visit: Payer: 59 | Admitting: Surgery

## 2019-09-25 ENCOUNTER — Ambulatory Visit: Payer: 59 | Admitting: Surgery

## 2019-09-25 ENCOUNTER — Other Ambulatory Visit: Payer: Self-pay

## 2019-09-25 ENCOUNTER — Encounter: Payer: Self-pay | Admitting: Surgery

## 2019-09-25 VITALS — BP 158/94 | HR 91 | Temp 97.5°F | Resp 16 | Ht 66.0 in | Wt 184.6 lb

## 2019-09-25 DIAGNOSIS — C50411 Malignant neoplasm of upper-outer quadrant of right female breast: Secondary | ICD-10-CM | POA: Diagnosis not present

## 2019-09-25 DIAGNOSIS — Z170 Estrogen receptor positive status [ER+]: Secondary | ICD-10-CM

## 2019-09-25 NOTE — Progress Notes (Signed)
Patient ID: Michelle Walker, female   DOB: [birth year], 58 y.o.   MRN: [id number]  Chief Complaint:   History of Present Illness Michelle Walker is a 58 y.o. 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  I understand she tolerated tamoxifen for about 6 months, tried an aromatase inhibitor but also could not tolerate it.  She presents today with follow-up imaging, and no complaints of breast pain, weight loss, bone pain/joint pain.  Denies abdominal pain, shortness of breath or cough.  Past Medical History Past Medical History:  Diagnosis Date  . Anemia   . Arthritis   . 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 Wide excision/ RT, T1a,N0. ER/PR pos, Her 2.neg. Tamoxifen x 6 months, d/c secondary to vasomotor sympotms.   . Headache   . Hypertension   . Personal history of radiation therapy 2016   RIGHT lumpectomy  . Thyroid disease       Past Surgical History:  Procedure Laterality Date  . ANTERIOR CRUCIATE LIGAMENT REPAIR Right   . BREAST BIOPSY Right 07/17/2015   INVASIVE LOBULAR CARCINOMA, CLASSIC TYPE (1.5 MM), ARISING IN A   . BREAST LUMPECTOMY Right 08/22/2015  . BREAST LUMPECTOMY WITH SENTINEL LYMPH NODE BIOPSY Right 08/22/2015   Procedure: BREAST LUMPECTOMY WITH SENTINEL LYMPH NODE BX;  Surgeon: Christene Lye, MD;  Location: [facility];  Service: General;  Laterality: Right;  . COLONOSCOPY W/ POLYPECTOMY  2014  . COLONOSCOPY WITH PROPOFOL N/A 04/16/2019   Procedure: COLONOSCOPY WITH BIOPSIES;  Surgeon: Lucilla Lame, MD;  Location: [place];  Service: Endoscopy;  Laterality: N/A;  . DILATION AND CURETTAGE OF UTERUS     20 years ago  . NOVASURE ABLATION    . POLYPECTOMY N/A 04/16/2019   Procedure: POLYPECTOMY;  Surgeon: Lucilla Lame, MD;  Location: [place];  Service: Endoscopy;  Laterality: N/A;    Allergies  Allergen Reactions  . Tape  Rash    SURGICAL TAPE AFTER BREAST BIOPSY    Current Outpatient Medications  Medication Sig Dispense Refill  . allopurinol (ZYLOPRIM) 100 MG tablet Take 100 mg by mouth at bedtime.    [name] amLODipine (NORVASC) 10 MG tablet Take 10 mg by mouth at bedtime.   0  . calcium-vitamin D (OSCAL WITH D) 500-200 MG-UNIT tablet Take 1 tablet by mouth daily with breakfast.    . losartan (COZAAR) 100 MG tablet Take 100 mg by mouth at bedtime.   0  . metoprolol succinate (TOPROL-XL) 25 MG 24 hr tablet Take 25 mg by mouth at bedtime.   0  . PROVENTIL HFA 108 (90 Base) MCG/ACT inhaler      No current facility-administered medications for this visit.     Family History Family History  Problem Relation Age of Onset  . Stroke Mother   . Cancer Sister        sarcoma on arm; chemo  . Diabetes Sister   . Stroke Sister   . Diabetes Brother   . Breast cancer Neg Hx   . Ovarian cancer Neg Hx   . Colon cancer Neg Hx   . Heart disease Neg Hx       Social History Social History   Tobacco Use  . Smoking status: Current Every Day Smoker    Packs/day: 0.50    Years: 40.00    Pack years: 20.00    Types: Cigarettes  . Smokeless  tobacco: Never Used  Substance Use Topics  . Alcohol use: Yes    Alcohol/week: 2.0 standard drinks    Types: 2 Standard drinks or equivalent per week    Comment: BEER 1-2 QD  . Drug use: No        Review of Systems  Constitutional: Negative.   HENT: Negative.   Eyes: Negative.   Respiratory: Negative.   Cardiovascular: Negative.   Gastrointestinal: Negative.   Skin: Negative.       Physical Exam Blood pressure (!) 158/94, pulse 91, temperature (!) 97.5 F (36.4 C), temperature source Temporal, resp. rate 16, height '5\' 6"'  (1.676 m), weight 184 lb 9.6 oz (83.7 kg), SpO2 98 %. Last Weight  Most recent update: 09/25/2019  9:22 AM   Weight  83.7 kg (184 lb 9.6 oz)            CONSTITUTIONAL: Well developed, and nourished, appropriately responsive and aware  without distress.  EYES: Sclera non-icteric.   EARS, NOSE, MOUTH AND THROAT: Mask worn.  Hearing is intact to voice.  NECK: Trachea is midline, and there is no jugular venous distension.  LYMPH NODES:  Lymph nodes in the neck are not enlarged. RESPIRATORY:  Lungs are clear, and breath sounds are equal bilaterally. Normal respiratory effort without pathologic use of accessory muscles. CARDIOVASCULAR: Heart is regular in rate and rhythm. GI: The abdomen is soft, nontender, and nondistended. There were no palpable masses. I did not appreciate hepatosplenomegaly.  GU: Left breast exam is unremarkable, no suspicious nodularity or palpable masses present.  Right breast is consistent with post radiation, lumpectomy and sentinel lymph node changes with the scar is appropriately healed.  Major density is surrounding the prior lumpectomy site in the upper outer quadrant, this is consistent with post radiation.  There are no suspicious nodules to the skin, subcutaneous tissues or breast tissues. MUSCULOSKELETAL:  Symmetrical muscle tone appreciated in all four extremities.    SKIN: Skin turgor is normal. No pathologic skin lesions appreciated.  NEUROLOGIC:  Motor and sensation appear grossly normal.  Cranial nerves are grossly without defect. PSYCH:  Alert and oriented to person, place and time. Affect is appropriate for situation.  Data Reviewed I have personally reviewed what is currently available of the patient's imaging, recent labs and medical records.    CLINICAL DATA:  Post right lumpectomy and radiation therapy for breast cancer in 2016.  EXAM: DIGITAL DIAGNOSTIC BILATERAL MAMMOGRAM WITH CAD AND TOMO  COMPARISON:  Previous exam(s).  ACR Breast Density Category b: There are scattered areas of fibroglandular density.  FINDINGS: Stable post lumpectomy changes on the right. No interval findings suspicious for malignancy in either breast.  Mammographic images were processed with  CAD.  IMPRESSION: No evidence of malignancy.  RECOMMENDATION: Bilateral diagnostic mammogram in 1 year.  I have discussed the findings and recommendations with the patient. If applicable, a reminder letter will be sent to the patient regarding the next appointment.  BI-RADS CATEGORY  2: Benign.   Electronically Signed   By: Claudie Revering M.D.   On: 09/13/2019 15:16   Assessment    History of T1N0 right breast cancer following breast conservation therapy, currently intolerant of hormonal blockade and therefore has not been able to complete the intended 5-year course.  Follow-up screening appears to be consistent without any evidence of active disease. Patient Active Problem List   Diagnosis Date Noted  . History of colonic polyps   . Polyp of ascending colon   . Depression 09/20/2017  .  DM2 (diabetes mellitus, type 2) ([place]) 09/20/2017  . Gout 09/20/2017  . HTN (hypertension) 09/20/2017  . Migraine 09/20/2017  . History of endometrial ablation 02/26/2016  . Breast cancer of upper-outer quadrant of right female breast ([place]) 08/04/2015    Plan    Will have follow-up annual diagnostic mammography and clinical exam in the office in 1 year.   Face-to-face time spent with the patient and accompanying care providers(if present) was 25 minutes, with more than 50% of the time spent counseling, educating, and coordinating care of the patient.      [name] 09/25/2019, 9:38 AM

## 2019-09-25 NOTE — Patient Instructions (Signed)
We will contact you in October 2021 to schedule your mammogram and breast exam for November 2021.   Please call if you have questions or concerns.

## 2020-07-25 ENCOUNTER — Other Ambulatory Visit: Payer: Self-pay

## 2020-07-25 DIAGNOSIS — C50411 Malignant neoplasm of upper-outer quadrant of right female breast: Secondary | ICD-10-CM

## 2020-08-11 ENCOUNTER — Encounter: Payer: Self-pay | Admitting: Podiatry

## 2020-08-11 ENCOUNTER — Ambulatory Visit (INDEPENDENT_AMBULATORY_CARE_PROVIDER_SITE_OTHER): Payer: 59 | Admitting: Podiatry

## 2020-08-11 ENCOUNTER — Ambulatory Visit: Payer: 59

## 2020-08-11 ENCOUNTER — Encounter: Payer: Self-pay | Admitting: *Deleted

## 2020-08-11 ENCOUNTER — Other Ambulatory Visit: Payer: Self-pay

## 2020-08-11 DIAGNOSIS — M722 Plantar fascial fibromatosis: Secondary | ICD-10-CM

## 2020-08-11 DIAGNOSIS — Q828 Other specified congenital malformations of skin: Secondary | ICD-10-CM | POA: Diagnosis not present

## 2020-08-11 NOTE — Progress Notes (Signed)
Subjective:  Patient ID: Michelle Walker, female    DOB: November 12, 1960,  MRN: [id number] HPI Chief Complaint  Patient presents with  . Callouses    Patient presents today for painful callous lesions bilat forefeet and painful corn right 5th toe x 1 month    59 y.o. female presents with the above complaint.   ROS: Denies fever chills nausea vomiting muscle aches pains calf pain back pain chest pain shortness of breath.  Past Medical History:  Diagnosis Date  . Anemia   . Arthritis   . 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 Wide excision/ RT, T1a,N0. ER/PR pos, Her 2.neg. Tamoxifen x 6 months, d/c secondary to vasomotor sympotms.   . Headache   . Hypertension   . Personal history of radiation therapy 2016   RIGHT lumpectomy  . Thyroid disease    Past Surgical History:  Procedure Laterality Date  . ANTERIOR CRUCIATE LIGAMENT REPAIR Right   . BREAST BIOPSY Right 07/17/2015   INVASIVE LOBULAR CARCINOMA, CLASSIC TYPE (1.5 MM), ARISING IN A   . BREAST LUMPECTOMY Right 08/22/2015  . BREAST LUMPECTOMY WITH SENTINEL LYMPH NODE BIOPSY Right 08/22/2015   Procedure: BREAST LUMPECTOMY WITH SENTINEL LYMPH NODE BX;  Surgeon: Christene Lye, MD;  Location: [facility];  Service: General;  Laterality: Right;  . COLONOSCOPY W/ POLYPECTOMY  2014  . COLONOSCOPY WITH PROPOFOL N/A 04/16/2019   Procedure: COLONOSCOPY WITH BIOPSIES;  Surgeon: Lucilla Lame, MD;  Location: [place];  Service: Endoscopy;  Laterality: N/A;  . DILATION AND CURETTAGE OF UTERUS     20 years ago  . NOVASURE ABLATION    . POLYPECTOMY N/A 04/16/2019   Procedure: POLYPECTOMY;  Surgeon: Lucilla Lame, MD;  Location: [place];  Service: Endoscopy;  Laterality: N/A;    Current Outpatient Medications:  .  allopurinol (ZYLOPRIM) 100 MG tablet, Take 100 mg by mouth at bedtime., Disp: , Rfl:  .  amLODipine (NORVASC) 10 MG tablet, Take 10 mg by mouth at bedtime. ,  Disp: , Rfl: 0 .  calcium-vitamin D (OSCAL WITH D) 500-200 MG-UNIT tablet, Take 1 tablet by mouth daily with breakfast., Disp: , Rfl:  .  D3-50 1.25 MG (50000 UT) capsule, Take 50,000 Units by mouth once a week., Disp: , Rfl:  .  losartan (COZAAR) 100 MG tablet, Take 100 mg by mouth at bedtime. , Disp: , Rfl: 0 .  metoprolol succinate (TOPROL-XL) 25 MG 24 hr tablet, Take 25 mg by mouth at bedtime. , Disp: , Rfl: 0 .  PROVENTIL HFA 108 (90 Base) MCG/ACT inhaler, , Disp: , Rfl:   Allergies  Allergen Reactions  . Triamcinolone Other (See Comments)    Hypopigmentation s/p steroid injection  . Tape Rash    SURGICAL TAPE AFTER BREAST BIOPSY   Review of Systems Objective:  There were no vitals filed for this visit.  General: Well developed, nourished, in no acute distress, alert and oriented x3   Dermatological: Skin is warm, dry and supple bilateral. Nails x 10 are well maintained; remaining integument appears unremarkable at this time. There are no open sores, no preulcerative lesions, no rash or signs of infection present.  Porokeratotic lesion subfirst and subfifth bilateral plantarflexed metatarsals pronated foot type  Vascular: Dorsalis Pedis artery and Posterior Tibial artery pedal pulses are 2/4 bilateral with immedate capillary fill time. Pedal hair growth present. No varicosities and no lower extremity edema present bilateral.   Neruologic: Grossly intact via  light touch bilateral. Vibratory intact via tuning fork bilateral. Protective threshold with Semmes Wienstein monofilament intact to all pedal sites bilateral. Patellar and Achilles deep tendon reflexes 2+ bilateral. No Babinski or clonus noted bilateral.   Musculoskeletal: No gross boney pedal deformities bilateral. No pain, crepitus, or limitation noted with foot and ankle range of motion bilateral. Muscular strength 5/5 in all groups tested bilateral.  Pronated foot type flexible in nature.  Gait: Unassisted, Nonantalgic.     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  She felt much better while she left the office.  I do want her to follow-up with Lil[facility] Channel for orthotics which will have a dropout subfirst and subfifth or at the site of the porokeratosis.     Carlesha Seiple T. [place], [place]

## 2020-09-03 ENCOUNTER — Ambulatory Visit (INDEPENDENT_AMBULATORY_CARE_PROVIDER_SITE_OTHER): Payer: 59 | Admitting: Orthotics

## 2020-09-03 ENCOUNTER — Other Ambulatory Visit: Payer: Self-pay

## 2020-09-03 DIAGNOSIS — Q828 Other specified congenital malformations of skin: Secondary | ICD-10-CM

## 2020-09-03 DIAGNOSIS — M722 Plantar fascial fibromatosis: Secondary | ICD-10-CM | POA: Diagnosis not present

## 2020-09-03 NOTE — Progress Notes (Signed)
Cast for f/o to address painful 1/5 keratomas; she works for [employer] and is on feet most of shift;  She will benefit with 1/5 offloads in a device w/ zote cover and paddding.

## 2020-09-15 ENCOUNTER — Ambulatory Visit
Admission: RE | Admit: 2020-09-15 | Discharge: 2020-09-15 | Disposition: A | Discharge status: Home / Self Care | Payer: 59 | Source: Ambulatory Visit | Attending: Surgery | Admitting: Surgery

## 2020-09-15 ENCOUNTER — Other Ambulatory Visit: Payer: Self-pay

## 2020-09-15 DIAGNOSIS — C50411 Malignant neoplasm of upper-outer quadrant of right female breast: Secondary | ICD-10-CM | POA: Diagnosis present

## 2020-09-15 DIAGNOSIS — Z170 Estrogen receptor positive status [ER+]: Secondary | ICD-10-CM | POA: Insufficient documentation

## 2020-09-16 ENCOUNTER — Other Ambulatory Visit: Payer: Self-pay | Admitting: Surgery

## 2020-09-16 DIAGNOSIS — R928 Other abnormal and inconclusive findings on diagnostic imaging of breast: Secondary | ICD-10-CM

## 2020-09-19 ENCOUNTER — Other Ambulatory Visit: Payer: Self-pay

## 2020-09-19 ENCOUNTER — Ambulatory Visit
Admission: RE | Admit: 2020-09-19 | Discharge: 2020-09-19 | Disposition: A | Discharge status: Home / Self Care | Payer: 59 | Source: Ambulatory Visit | Attending: Surgery | Admitting: Surgery

## 2020-09-19 DIAGNOSIS — R928 Other abnormal and inconclusive findings on diagnostic imaging of breast: Secondary | ICD-10-CM

## 2020-09-19 HISTORY — PX: BREAST BIOPSY: SHX20

## 2020-09-22 NOTE — Progress Notes (Signed)
Navigation initiated.  Patient scheduled with Dr. Christian Mate, and Dr. Janese Banks for consults on 10/07/20.  Hx or right breast cancer with lumpectomy 2016.   Patient states she stopped Tamoxifen related to side effects .

## 2020-10-07 ENCOUNTER — Encounter: Payer: Self-pay | Admitting: Surgery

## 2020-10-07 ENCOUNTER — Inpatient Hospital Stay: Payer: 59 | Attending: Oncology | Admitting: Oncology

## 2020-10-07 ENCOUNTER — Other Ambulatory Visit: Payer: Self-pay

## 2020-10-07 ENCOUNTER — Encounter: Payer: Self-pay | Admitting: Oncology

## 2020-10-07 ENCOUNTER — Ambulatory Visit (INDEPENDENT_AMBULATORY_CARE_PROVIDER_SITE_OTHER): Payer: 59 | Admitting: Surgery

## 2020-10-07 VITALS — BP 136/83 | HR 98 | Temp 98.2°F | Resp 16 | Ht 66.0 in | Wt 192.6 lb

## 2020-10-07 VITALS — BP 128/83 | HR 82 | Temp 97.9°F | Ht 66.0 in | Wt 193.2 lb

## 2020-10-07 DIAGNOSIS — I10 Essential (primary) hypertension: Secondary | ICD-10-CM | POA: Insufficient documentation

## 2020-10-07 DIAGNOSIS — F17210 Nicotine dependence, cigarettes, uncomplicated: Secondary | ICD-10-CM | POA: Insufficient documentation

## 2020-10-07 DIAGNOSIS — Z923 Personal history of irradiation: Secondary | ICD-10-CM | POA: Diagnosis not present

## 2020-10-07 DIAGNOSIS — C50412 Malignant neoplasm of upper-outer quadrant of left female breast: Secondary | ICD-10-CM

## 2020-10-07 DIAGNOSIS — E119 Type 2 diabetes mellitus without complications: Secondary | ICD-10-CM | POA: Insufficient documentation

## 2020-10-07 DIAGNOSIS — Z170 Estrogen receptor positive status [ER+]: Secondary | ICD-10-CM

## 2020-10-07 DIAGNOSIS — Z79899 Other long term (current) drug therapy: Secondary | ICD-10-CM | POA: Insufficient documentation

## 2020-10-07 DIAGNOSIS — C50411 Malignant neoplasm of upper-outer quadrant of right female breast: Secondary | ICD-10-CM | POA: Insufficient documentation

## 2020-10-07 DIAGNOSIS — Z79811 Long term (current) use of aromatase inhibitors: Secondary | ICD-10-CM | POA: Diagnosis not present

## 2020-10-07 DIAGNOSIS — F32A Depression, unspecified: Secondary | ICD-10-CM | POA: Diagnosis not present

## 2020-10-07 NOTE — Progress Notes (Signed)
Patient ID: Michelle Walker, female   DOB: 1961-06-24, 59 y.o.   MRN: [id number]  Chief Complaint: Left breast cancer  History of Present Illness Michelle Walker is a 59 y.o. female with a history of T1N0 HER-2 negative, ER/PR positive, who underwent her breast conservation therapy in 2016.  I understand she tolerated tamoxifen for about 6 months, tried an aromatase inhibitor but also could not tolerate it.   Recent screening imaging revealed suspicious lesions in the left breast, along with thickened cortex of an axillary node.  Ultrasound biopsies of 2 separate areas of the upper outer left breast were completed along with the axillary lymph node.  All came back positive for invasive mammary carcinoma, ER/PR positive and HER-2 2+ equivocal.  As far as we know the FISH was not sent, or is pending. Patient was made aware over the phone from the radiology department that she had cancer. She presents today to begin surgical planning for treatment.  She has an appointment to see Dr. Janese Banks later this afternoon.  Past Medical History Past Medical History:  Diagnosis Date  . Anemia   . Arthritis   . 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 Wide excision/ RT, T1a,N0. ER/PR pos, Her 2.neg. Tamoxifen x 6 months, d/c secondary to vasomotor sympotms.   . Headache   . Hypertension   . Personal history of radiation therapy 2016   RIGHT lumpectomy  . Thyroid disease       Past Surgical History:  Procedure Laterality Date  . ANTERIOR CRUCIATE LIGAMENT REPAIR Right   . BREAST BIOPSY Right 07/17/2015   INVASIVE LOBULAR CARCINOMA, CLASSIC TYPE (1.5 MM), ARISING IN A   . BREAST BIOPSY Left 09/19/2020   [place] bx 3 areas/ 1oc q clip/ 12 oc vision clip, axilla lymph node hydromark shape 3/ path pending  . BREAST LUMPECTOMY Right 08/22/2015  . BREAST LUMPECTOMY WITH SENTINEL LYMPH NODE BIOPSY Right 08/22/2015   Procedure: BREAST LUMPECTOMY WITH SENTINEL LYMPH NODE BX;   Surgeon: Christene Lye, MD;  Location: [facility];  Service: General;  Laterality: Right;  . COLONOSCOPY W/ POLYPECTOMY  2014  . COLONOSCOPY WITH PROPOFOL N/A 04/16/2019   Procedure: COLONOSCOPY WITH BIOPSIES;  Surgeon: Lucilla Lame, MD;  Location: [facility];  Service: Endoscopy;  Laterality: N/A;  . DILATION AND CURETTAGE OF UTERUS     20 years ago  . NOVASURE ABLATION    . POLYPECTOMY N/A 04/16/2019   Procedure: POLYPECTOMY;  Surgeon: Lucilla Lame, MD;  Location: [facility];  Service: Endoscopy;  Laterality: N/A;    Allergies  Allergen Reactions  . Triamcinolone Other (See Comments)    Hypopigmentation s/p steroid injection  . Tape Rash    SURGICAL TAPE AFTER BREAST BIOPSY    Current Outpatient Medications  Medication Sig Dispense Refill  . allopurinol (ZYLOPRIM) 100 MG tablet Take 100 mg by mouth at bedtime.    [name] amLODipine (NORVASC) 10 MG tablet Take 10 mg by mouth at bedtime.   0  . calcium-vitamin D (OSCAL WITH D) 500-200 MG-UNIT tablet Take 1 tablet by mouth daily with breakfast.    . D3-50 1.25 MG (50000 UT) capsule Take 50,000 Units by mouth once a week.    . losartan (COZAAR) 100 MG tablet Take 100 mg by mouth at bedtime.   0  . metoprolol succinate (TOPROL-XL) 25 MG 24 hr tablet Take 25 mg by mouth at bedtime.   0  . PROVENTIL  HFA 108 (90 Base) MCG/ACT inhaler Inhale 2 puffs into the lungs every 6 (six) hours as needed.      No current facility-administered medications for this visit.    Family History Family History  Problem Relation Age of Onset  . Stroke Mother   . Cancer Sister        sarcoma on arm; chemo  . Diabetes Sister   . Stroke Sister   . Diabetes Brother   . Breast cancer Neg Hx   . Ovarian cancer Neg Hx   . Colon cancer Neg Hx   . Heart disease Neg Hx       Social History Social History   Tobacco Use  . Smoking status: Current Every Day Smoker    Packs/day: 0.50    Years: 40.00    Pack years: 20.00    Types:  Cigarettes  . Smokeless tobacco: Never Used  Vaping Use  . Vaping Use: Never used  Substance Use Topics  . Alcohol use: Yes    Alcohol/week: 2.0 standard drinks    Types: 2 Standard drinks or equivalent per week    Comment: BEER 1-2 QD  . Drug use: No        Review of Systems  Constitutional: Negative.   HENT: Negative.   Eyes: Negative.   Respiratory: Negative.   Cardiovascular: Negative.   Gastrointestinal: Negative.   Genitourinary: Negative.   Skin: Negative.   Neurological: Negative.   Psychiatric/Behavioral: Negative.       Physical Exam Blood pressure 128/83, pulse 82, temperature 97.9 F (36.6 C), temperature source Oral, height 5' 6" (1.676 m), weight 193 lb 3.2 oz (87.6 kg), SpO2 94 %. Last Weight  Most recent update: 10/07/2020 10:02 AM   Weight  87.6 kg (193 lb 3.2 oz)             CONSTITUTIONAL: Well developed, and nourished, appropriately responsive and aware without distress.  EYES: Sclera non-icteric.   EARS, NOSE, MOUTH AND THROAT: Mask worn.  Hearing is intact to voice.  NECK: Trachea is midline, and there is no jugular venous distension.  LYMPH NODES:  Lymph nodes in the neck are not enlarged. RESPIRATORY:  Lungs are clear, and breath sounds are equal bilaterally. Normal respiratory effort without pathologic use of accessory muscles. CARDIOVASCULAR: Heart is regular in rate and rhythm. GI: The abdomen is soft, nontender, and nondistended. There were no palpable masses. I did not appreciate hepatosplenomegaly.  GU: Left breast exam vague mass upper left breast.  Right breast is consistent with post radiation, lumpectomy and sentinel lymph node changes with the scar is appropriately healed.  Major density is surrounding the prior lumpectomy site in the upper outer quadrant, this is consistent with post radiation.  There are no suspicious nodules to the skin, subcutaneous tissues or breast tissues. MUSCULOSKELETAL:  Symmetrical muscle tone appreciated in  all four extremities.    SKIN: Skin turgor is normal. No pathologic skin lesions appreciated.  NEUROLOGIC:  Motor and sensation appear grossly normal.  Cranial nerves are grossly without defect. PSYCH:  Alert and oriented to person, place and time. Affect is appropriate for situation.   Data Reviewed I have personally reviewed what is currently available of the patient's imaging, recent labs and medical records.   Labs:  CBC Latest Ref Rng & Units 10/08/2015 09/23/2015 08/05/2015  WBC 3.6 - 11.0 K/uL 4.6 5.1 6.7  Hemoglobin 12.0 - 16.0 g/dL 13.7 14.0 12.9  Hematocrit 35 - 47 % 42.2 42.6 39.4  Platelets 150 - 440 K/uL 176 198 172   CMP Latest Ref Rng & Units 12/07/2017 08/05/2015 10/18/2013  Glucose 65 - 99 mg/dL 90 100(H) -  BUN 6 - 20 mg/dL 16 14 -  Creatinine 0.44 - 1.00 mg/dL 0.77 0.77 -  Sodium 135 - 145 mmol/L 139 137 -  Potassium 3.5 - 5.1 mmol/L 3.8 3.8 3.6  Chloride 101 - 111 mmol/L 103 111 -  CO2 22 - 32 mmol/L 24 23 -  Calcium 8.9 - 10.3 mg/dL 9.8 9.0 -  Total Protein 6.5 - 8.1 g/dL 7.5 6.8 -  Total Bilirubin 0.3 - 1.2 mg/dL 0.4 0.5 -  Alkaline Phos 38 - 126 U/L 87 61 -  AST 15 - 41 U/L 18 23 -  ALT 14 - 54 U/L 9(L) 12(L) -     Imaging: Radiology review:   CLINICAL DATA:  Evaluate placement of Q biopsy clip (1 o'clock position mass), VISION biopsy clip (12 o'clock position mass) and HydroMARK biopsy clip (LEFT axillary lymph node) following ultrasound-guided LEFT breast and LEFT axillary biopsies.  EXAM: 3D DIAGNOSTIC LEFT MAMMOGRAM POST ULTRASOUND BIOPSY  COMPARISON:  Previous exam(s).  FINDINGS: 3D Mammographic images were obtained following ultrasound guided biopsies of the 0.4 cm mass at the 1 o'clock position 5 cm from the nipple (Q clip), the 2.2 cm mass at the 12 o'clock position (VISION clip) and a LEFT axillary lymph node (HydroMARK clip).  The Q biopsy marking clip is in expected position at the site of biopsy of the 0.4 cm mass at the 1 o'clock  position 5 cm from the nipple.  The VISION biopsy marking clip is in expected position at the site of biopsy of the 2.2 cm mass at the 12 o'clock position.  The Q and VISION clips are separated by a distance of 4.5 cm.  The [facility] clip is in expected position at the site of LEFT axillary lymph node biopsy.  IMPRESSION: Appropriate positioning of the Q shaped biopsy marking clip at the site of biopsy in the UPPER OUTER LEFT breast.  Appropriate positioning of the VISION shaped biopsy marking clip at the site of biopsy in the UPPER LEFT breast.  The Q and VISION clips are separated by a distance of 4.5 cm.  Appropriate positioning of the HydroMARK clip in the LEFT axilla.  Final Assessment: Post Procedure Mammograms for Marker Placement   Electronically Signed   By: Margarette Canada M.D.   On: 09/19/2020 10:44  CLINICAL DATA:  Patient with history of right breast lumpectomy 2016.  EXAM: DIGITAL DIAGNOSTIC BILATERAL MAMMOGRAM WITH CAD AND TOMO  ULTRASOUND LEFT BREAST  COMPARISON:  Previous exam(s).  ACR Breast Density Category c: The breast tissue is heterogeneously dense, which may obscure small masses.  FINDINGS: Within the upper-outer left breast extending from behind the nipple towards the outer left breast there are 3 focal areas of distortion demonstrated both on the cc and true lateral views. No additional new suspicious findings within the right or left breast. Stable postlumpectomy changes right breast.  Mammographic images were processed with CAD.  Targeted ultrasound is performed, showing a 2.2 x 1.9 x 1.9 cm irregular hypoechoic mass left breast 12 o'clock position 1 cm from the nipple.  There is a 0.8 x 0.5 x 0.7 cm irregular hypoechoic mass left breast 1 o'clock position 3 cm from nipple.  There is a 0.3 x 0.4 x 0.4 cm irregular hypoechoic mass left breast 1 o'clock position 5 cm from nipple.  There  is a 0.5 x 0.3 x 0.4 cm  lobular round mass left breast 12 o'clock position 5 cm from nipple.  There is a 0.5 x 0.3 x 0.4 cm lobular hypoechoic mass left breast 11 o'clock position 5 cm from nipple.  There is a 0.3 x 0.6 x 0.6 cm irregular hypoechoic mass left breast 12 o'clock position 6 cm from nipple.  Cortically thickened left axillary lymph node measuring 4 mm.  IMPRESSION: 1. Interval development of at least 3 focal areas of architectural distortion within the retroareolar and upper outer left breast. There are multiple new suspicious masses identified within the left breast from 11 o'clock-1 o'clock position as detailed above. 2. Stable postlumpectomy changes right breast.  RECOMMENDATION: 1. Ultrasound-guided core needle biopsy left breast mass 12 o'clock position 1 cm from the nipple. 2. Ultrasound-guided core needle biopsy left breast mass 1 o'clock position 5 cm from nipple. 3. Ultrasound-guided core needle biopsy left axillary lymph node. 4. Patient will need bilateral breast MRI for extent of disease. 5. If breast conservation is being considered, additional biopsies of the above described masses will need to be obtained for extent of disease confirmation.  I have discussed the findings and recommendations with the patient. If applicable, a reminder letter will be sent to the patient regarding the next appointment.  BI-RADS CATEGORY  5: Highly suggestive of malignancy.   Electronically Signed   By: Lovey Newcomer M.D.   On: 09/15/2020 15:42  Assessment    Second breast cancer, advanced with lymph node involvement now left breast, with prior history of right breast cancer. Patient Active Problem List   Diagnosis Date Noted  . History of colonic polyps   . Polyp of ascending colon   . Depression 09/20/2017  . DM2 (diabetes mellitus, type 2) ([place]) 09/20/2017  . Gout 09/20/2017  . HTN (hypertension) 09/20/2017  . Migraine 09/20/2017  . History of endometrial ablation  02/26/2016  . Breast cancer of upper-outer quadrant of right female breast ([place]) 08/04/2015    Plan    We discussed the role of MRI as far as utilizing it for monitoring response to chemotherapy.  I do not believe with the multiple lesions noted, that breast conservation would ever be considered should MRI she was an amazing response.  We are anticipating chemotherapy upfront due to her positive lymph node status.  We discussed the option of surgery first, and explained the strategies of chemo upfront. At the present she would likely need a left modified radical mastectomy with axillary dissection.  We did not discuss immediate reconstruction at this time, primarily because we are anticipating chemotherapy first. We did discuss placement of a Port-A-Cath if this is to be our approach from a multidisciplinary standpoint. I suspect ideally the Port-A-Cath should be done after the MRI is completed. Risks of port placement discussed with patient, and she seems to have prior experience with a relative.  Face-to-face time spent with the patient and accompanying care providers(if present) was 45 minutes, with more than 50% of the time spent counseling, educating, and coordinating care of the patient.      [name] M.D., FACS 10/07/2020, 12:15 PM

## 2020-10-07 NOTE — Patient Instructions (Addendum)
We have put in a MRI of the breasts. Michelle Walker from [facility] will call you with your appointment. Our surgery scheduler will call you within 24-48 hours to get you scheduled for port placement. If you have not heard anything after 48 hours, please call our office.     Implanted [facility] Guide An implanted port is a device that is placed under the skin. It is usually placed in the chest. The device can be used to give IV medicine, to take blood, or for dialysis. You may have an implanted port if:  You need IV medicine that would be irritating to the small veins in your hands or arms.  You need IV medicines, such as antibiotics, for a long period of time.  You need IV nutrition for a long period of time.  You need dialysis. Having a port means that your health care provider will not need to use the veins in your arms for these procedures. You may have fewer limitations when using a port than you would if you used other types of long-term IVs, and you will likely be able to return to normal activities after your incision heals. An implanted port has two main parts:  Reservoir. The reservoir is the part where a needle is inserted to give medicines or draw blood. The reservoir is round. After it is placed, it appears as a small, raised area under your skin.  Catheter. The catheter is a thin, flexible tube that connects the reservoir to a vein. Medicine that is inserted into the reservoir goes into the catheter and then into the vein. How is my port accessed? To access your port:  A numbing cream may be placed on the skin over the port site.  Your health care provider will put on a mask and sterile gloves.  The skin over your port will be cleaned carefully with a germ-killing soap and allowed to dry.  Your health care provider will gently pinch the port and insert a needle into it.  Your health care provider will check for a blood return to make sure the port is in the vein and  is not clogged.  If your port needs to remain accessed to get medicine continuously (constant infusion), your health care provider will place a clear bandage (dressing) over the needle site. The dressing and needle will need to be changed every week, or as told by your health care provider. What is flushing? Flushing helps keep the port from getting clogged. Follow instructions from your health care provider about how and when to flush the port. Ports are usually flushed with saline solution or a medicine called heparin. The need for flushing will depend on how the port is used:  If the port is only used from time to time to give medicines or draw blood, the port may need to be flushed: ? Before and after medicines have been given. ? Before and after blood has been drawn. ? As part of routine maintenance. Flushing may be recommended every 4-6 weeks.  If a constant infusion is running, the port may not need to be flushed.  Throw away any syringes in a disposal container that is meant for sharp items (sharps container). You can buy a sharps container from a pharmacy, or you can make one by using an empty hard plastic bottle with a cover. How long will my port stay implanted? The port can stay in for as long as your health care provider thinks it  is needed. When it is time for the port to come out, a surgery will be done to remove it. The surgery will be similar to the procedure that was done to put the port in. Follow these instructions at home:   Flush your port as told by your health care provider.  If you need an infusion over several days, follow instructions from your health care provider about how to take care of your port site. Make sure you: ? Wash your hands with soap and water before you change your dressing. If soap and water are not available, use alcohol-based hand sanitizer. ? Change your dressing as told by your health care provider. ? Place any used dressings or infusion bags  into a plastic bag. Throw that bag in the trash. ? Keep the dressing that covers the needle clean and dry. Do not get it wet. ? Do not use scissors or sharp objects near the tube. ? Keep the tube clamped, unless it is being used.  Check your port site every day for signs of infection. Check for: ? Redness, swelling, or pain. ? Fluid or blood. ? Pus or a bad smell.  Protect the skin around the port site. ? Avoid wearing bra straps that rub or irritate the site. ? Protect the skin around your port from seat belts. Place a soft pad over your chest if needed.  Bathe or shower as told by your health care provider. The site may get wet as long as you are not actively receiving an infusion.  Return to your normal activities as told by your health care provider. Ask your health care provider what activities are safe for you.  Carry a medical alert card or wear a medical alert bracelet at all times. This will let health care providers know that you have an implanted port in case of an emergency. Get help right away if:  You have redness, swelling, or pain at the port site.  You have fluid or blood coming from your port site.  You have pus or a bad smell coming from the port site.  You have a fever. Summary  Implanted ports are usually placed in the chest for long-term IV access.  Follow instructions from your health care provider about flushing the port and changing bandages (dressings).  Take care of the area around your port by avoiding clothing that puts pressure on the area, and by watching for signs of infection.  Protect the skin around your port from seat belts. Place a soft pad over your chest if needed.  Get help right away if you have a fever or you have redness, swelling, pain, drainage, or a bad smell at the port site. This information is not intended to replace advice given to you by your health care provider. Make sure you discuss any questions you have with your health  care provider. Document Revised: 02/09/2019 Document Reviewed: 11/20/2016 Elsevier Patient Education  2020 [employer].

## 2020-10-07 NOTE — Progress Notes (Signed)
Pt here for new dx of breast cancer on left side. Prior breast cancer on right in 2016. Pt is tearful about that she was told by surgeon she will need chemo. She is is here to speak with Janese Banks about plan for pt.

## 2020-10-09 ENCOUNTER — Other Ambulatory Visit: Payer: Self-pay | Admitting: *Deleted

## 2020-10-09 ENCOUNTER — Encounter: Payer: Self-pay | Admitting: Oncology

## 2020-10-09 DIAGNOSIS — C50411 Malignant neoplasm of upper-outer quadrant of right female breast: Secondary | ICD-10-CM

## 2020-10-09 NOTE — Progress Notes (Signed)
Hematology/Oncology Consult note [facility] Telephone:([phone] [phone]  Patient Care Team: Casilda Carls, MD as PCP - General (Internal Medicine) Casilda Carls, MD as Consulting Physician (Internal Medicine) Christene Lye, MD (General Surgery) Garrel Ridgel, DPM as Consulting Physician (Podiatry)   Name of the patient: Michelle Walker  [id number]  1961/05/11    Reason for referral-recurrent breast cancer lost to follow-up   Referring physician-Dr. 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  She did not require adjuvant chemotherapy.  She was initially on tamoxifen which she could not tolerate and was switched to letrozole.  Last seen by me in June 2019 at which point she had a menstrual cycle and therefore not given another AI.  She had subsequently did not follow-up with me and remained off hormone therapy.  More recently patient underwent a diagnostic bilateral mammogram which showed a 2.2 x 1.9 x 1.9 cm mass at the 12 o'clock position of the left breast 1 cm from the nipple.  She was also found to have 5 other smaller breast masses ranging from 0.3 to 0.8 cm.  Noted to have a solitary left axillary lymph node with cortical thickening.  She had a biopsy of the dominant left breast mass as well as another smaller breast mass and the left axillary lymph node all of which were positive for metastatic invasive mammary carcinoma grade 2.  Tumor was ER 91 200% positive PR 71 to 80% positive and HER-2 IHC equivocal.  Patient was seen by Dr. Christian Mate and referred to medical oncology for further management.  Currently patient reports mild fatigue but denies other complaints at this time.  She is tearful and anxious about her neck steps.  Patient reports she  has not had any menstrual cycles for the last year and a half  ECOG PS- 1  Pain scale- 0   Review of systems- Review of Systems  Constitutional: Positive for malaise/fatigue. Negative for chills, fever and weight loss.  HENT: Negative for congestion, ear discharge and nosebleeds.   Eyes: Negative for blurred vision.  Respiratory: Negative for cough, hemoptysis, sputum production, shortness of breath and wheezing.   Cardiovascular: Negative for chest pain, palpitations, orthopnea and claudication.  Gastrointestinal: Negative for abdominal pain, blood in stool, constipation, diarrhea, heartburn, melena, nausea and vomiting.  Genitourinary: Negative for dysuria, flank pain, frequency, hematuria and urgency.  Musculoskeletal: Negative for back pain, joint pain and myalgias.  Skin: Negative for rash.  Neurological: Negative for dizziness, tingling, focal weakness, seizures, weakness and headaches.  Endo/Heme/Allergies: Does not bruise/bleed easily.  Psychiatric/Behavioral: Negative for depression and suicidal ideas. The patient is nervous/anxious. The patient does not have insomnia.     Allergies  Allergen Reactions  . Triamcinolone Other (See Comments)    Hypopigmentation s/p steroid injection  . Tape Rash    SURGICAL TAPE AFTER BREAST BIOPSY    Patient Active Problem List   Diagnosis Date Noted  . History of colonic polyps   . Polyp of ascending colon   . Depression 09/20/2017  . DM2 (diabetes mellitus, type 2) ([place]) 09/20/2017  . Gout 09/20/2017  . HTN (hypertension) 09/20/2017  . Migraine 09/20/2017  . History of endometrial ablation 02/26/2016  . Breast cancer of upper-outer quadrant of right female breast ([place]) 08/04/2015     Past Medical  History:  Diagnosis Date  . Anemia   . Arthritis   . 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 Wide excision/ RT, T1a,N0. ER/PR pos, Her 2.neg. Tamoxifen x 6 months, d/c secondary to  vasomotor sympotms.   . Headache   . Hypertension   . Personal history of radiation therapy 2016   RIGHT lumpectomy  . Thyroid disease      Past Surgical History:  Procedure Laterality Date  . ANTERIOR CRUCIATE LIGAMENT REPAIR Right   . BREAST BIOPSY Right 07/17/2015   INVASIVE LOBULAR CARCINOMA, CLASSIC TYPE (1.5 MM), ARISING IN A   . BREAST BIOPSY Left 09/19/2020   us bx 3 areas/ 1oc q clip/ 12 oc vision clip, axilla lymph node hydromark shape 3/ path pending  . BREAST LUMPECTOMY Right 08/22/2015  . BREAST LUMPECTOMY WITH SENTINEL LYMPH NODE BIOPSY Right 08/22/2015   Procedure: BREAST LUMPECTOMY WITH SENTINEL LYMPH NODE BX;  Surgeon: Seeplaputhur G Sankar, MD;  Location: [facility];  Service: General;  Laterality: Right;  . COLONOSCOPY W/ POLYPECTOMY  2014  . COLONOSCOPY WITH PROPOFOL N/A 04/16/2019   Procedure: COLONOSCOPY WITH BIOPSIES;  Surgeon: Wohl, Darren, MD;  Location: [facility];  Service: Endoscopy;  Laterality: N/A;  . DILATION AND CURETTAGE OF UTERUS     20 years ago  . NOVASURE ABLATION    . POLYPECTOMY N/A 04/16/2019   Procedure: POLYPECTOMY;  Surgeon: Wohl, Darren, MD;  Location: [facility];  Service: Endoscopy;  Laterality: N/A;    Social History   Socioeconomic History  . Marital status: Married    Spouse name: Not on file  . Number of children: Not on file  . Years of education: Not on file  . Highest education level: Not on file  Occupational History  . Not on file  Tobacco Use  . Smoking status: Current Every Day Smoker    Packs/day: 0.50    Years: 40.00    Pack years: 20.00    Types: Cigarettes  . Smokeless tobacco: Never Used  Vaping Use  . Vaping Use: Never used  Substance and Sexual Activity  . Alcohol use: Yes    Alcohol/week: 2.0 standard drinks    Types: 2 Standard drinks or equivalent per week    Comment: BEER 1-2 QD  . Drug use: No  . Sexual activity: Yes    Comment: ablation  Other Topics Concern  . Not on file   Social History Narrative  . Not on file   Social Determinants of Health   Financial Resource Strain: Not on file  Food Insecurity: Not on file  Transportation Needs: Not on file  Physical Activity: Not on file  Stress: Not on file  Social Connections: Not on file  Intimate Partner Violence: Not on file     Family History  Problem Relation Age of Onset  . Stroke Mother   . Cancer Sister        sarcoma on arm; chemo  . Diabetes Sister   . Stroke Sister   . Diabetes Brother   . Breast cancer Neg Hx   . Ovarian cancer Neg Hx   . Colon cancer Neg Hx   . Heart disease Neg Hx      Current Outpatient Medications:  .  allopurinol (ZYLOPRIM) 100 MG tablet, Take 100 mg by mouth at bedtime., Disp: , Rfl:  .  amLODipine (NORVASC) 10 MG tablet, Take 10 mg by mouth at bedtime. , Disp: , Rfl:   0 .  calcium-vitamin D (OSCAL WITH D) 500-200 MG-UNIT tablet, Take 1 tablet by mouth daily with breakfast., Disp: , Rfl:  .  D3-50 1.25 MG (50000 UT) capsule, Take 50,000 Units by mouth once a week., Disp: , Rfl:  .  losartan (COZAAR) 100 MG tablet, Take 100 mg by mouth at bedtime. , Disp: , Rfl: 0 .  metoprolol succinate (TOPROL-XL) 25 MG 24 hr tablet, Take 25 mg by mouth at bedtime. , Disp: , Rfl: 0 .  PROVENTIL HFA 108 (90 Base) MCG/ACT inhaler, Inhale 2 puffs into the lungs every 6 (six) hours as needed. , Disp: , Rfl:    Physical exam:  Vitals:   10/07/20 1109  BP: 136/83  Pulse: 98  Resp: 16  Temp: 98.2 F (36.8 C)  TempSrc: Oral  Weight: 192 lb 9.6 oz (87.4 kg)  Height: 5' 6" (1.676 m)   Physical Exam Constitutional:      General: She is not in acute distress. Eyes:     Extraocular Movements: EOM normal.  Cardiovascular:     Rate and Rhythm: Normal rate and regular rhythm.     Heart sounds: Normal heart sounds.  Pulmonary:     Effort: Pulmonary effort is normal.     Breath sounds: Normal breath sounds.  Abdominal:     General: Bowel sounds are normal.     Palpations:  Abdomen is soft.  Skin:    General: Skin is warm and dry.  Neurological:     Mental Status: She is alert and oriented to person, place, and time.   Breast: Breast exam performed in sitting and lying down position.  Patient is s/p right lumpectomy with a well-healed surgical scar.  Left breast masses not distinctly palpable.  No palpable left axillary adenopathy    CMP Latest Ref Rng & Units 12/07/2017  Glucose 65 - 99 mg/dL 90  BUN 6 - 20 mg/dL 16  Creatinine 0.44 - 1.00 mg/dL 0.77  Sodium 135 - 145 mmol/L 139  Potassium 3.5 - 5.1 mmol/L 3.8  Chloride 101 - 111 mmol/L 103  CO2 22 - 32 mmol/L 24  Calcium 8.9 - 10.3 mg/dL 9.8  Total Protein 6.5 - 8.1 g/dL 7.5  Total Bilirubin 0.3 - 1.2 mg/dL 0.4  Alkaline Phos 38 - 126 U/L 87  AST 15 - 41 U/L 18  ALT 14 - 54 U/L 9(L)   CBC Latest Ref Rng & Units 10/08/2015  WBC 3.6 - 11.0 K/uL 4.6  Hemoglobin 12.0 - 16.0 g/dL 13.7  Hematocrit 35.0 - 47.0 % 42.2  Platelets 150 - 440 K/uL 176    No images are attached to the encounter.  US BREAST LTD UNI LEFT INC AXILLA  Result Date: 09/15/2020 CLINICAL DATA:  Patient with history of right breast lumpectomy 2016. EXAM: DIGITAL DIAGNOSTIC BILATERAL MAMMOGRAM WITH CAD AND TOMO ULTRASOUND LEFT BREAST COMPARISON:  Previous exam(s). ACR Breast Density Category c: The breast tissue is heterogeneously dense, which may obscure small masses. FINDINGS: Within the upper-outer left breast extending from behind the nipple towards the outer left breast there are 3 focal areas of distortion demonstrated both on the cc and true lateral views. No additional new suspicious findings within the right or left breast. Stable postlumpectomy changes right breast. Mammographic images were processed with CAD. Targeted ultrasound is performed, showing a 2.2 x 1.9 x 1.9 cm irregular hypoechoic mass left breast 12 o'clock position 1 cm from the nipple. There is a 0.8 x 0.5 x 0.7 cm   irregular hypoechoic mass left breast 1 o'clock  position 3 cm from nipple. There is a 0.3 x 0.4 x 0.4 cm irregular hypoechoic mass left breast 1 o'clock position 5 cm from nipple. There is a 0.5 x 0.3 x 0.4 cm lobular round mass left breast 12 o'clock position 5 cm from nipple. There is a 0.5 x 0.3 x 0.4 cm lobular hypoechoic mass left breast 11 o'clock position 5 cm from nipple. There is a 0.3 x 0.6 x 0.6 cm irregular hypoechoic mass left breast 12 o'clock position 6 cm from nipple. Cortically thickened left axillary lymph node measuring 4 mm. IMPRESSION: 1. Interval development of at least 3 focal areas of architectural distortion within the retroareolar and upper outer left breast. There are multiple new suspicious masses identified within the left breast from 11 o'clock-1 o'clock position as detailed above. 2. Stable postlumpectomy changes right breast. RECOMMENDATION: 1. Ultrasound-guided core needle biopsy left breast mass 12 o'clock position 1 cm from the nipple. 2. Ultrasound-guided core needle biopsy left breast mass 1 o'clock position 5 cm from nipple. 3. Ultrasound-guided core needle biopsy left axillary lymph node. 4. Patient will need bilateral breast MRI for extent of disease. 5. If breast conservation is being considered, additional biopsies of the above described masses will need to be obtained for extent of disease confirmation. I have discussed the findings and recommendations with the patient. If applicable, a reminder letter will be sent to the patient regarding the next appointment. BI-RADS CATEGORY  5: Highly suggestive of malignancy. Electronically Signed   By: Lovey Newcomer M.D.   On: 09/15/2020 15:42   MM DIAG BREAST TOMO BILATERAL  Result Date: 09/15/2020 CLINICAL DATA:  Patient with history of right breast lumpectomy 2016. EXAM: DIGITAL DIAGNOSTIC BILATERAL MAMMOGRAM WITH CAD AND TOMO ULTRASOUND LEFT BREAST COMPARISON:  Previous exam(s). ACR Breast Density Category c: The breast tissue is heterogeneously dense, which may obscure  small masses. FINDINGS: Within the upper-outer left breast extending from behind the nipple towards the outer left breast there are 3 focal areas of distortion demonstrated both on the cc and true lateral views. No additional new suspicious findings within the right or left breast. Stable postlumpectomy changes right breast. Mammographic images were processed with CAD. Targeted ultrasound is performed, showing a 2.2 x 1.9 x 1.9 cm irregular hypoechoic mass left breast 12 o'clock position 1 cm from the nipple. There is a 0.8 x 0.5 x 0.7 cm irregular hypoechoic mass left breast 1 o'clock position 3 cm from nipple. There is a 0.3 x 0.4 x 0.4 cm irregular hypoechoic mass left breast 1 o'clock position 5 cm from nipple. There is a 0.5 x 0.3 x 0.4 cm lobular round mass left breast 12 o'clock position 5 cm from nipple. There is a 0.5 x 0.3 x 0.4 cm lobular hypoechoic mass left breast 11 o'clock position 5 cm from nipple. There is a 0.3 x 0.6 x 0.6 cm irregular hypoechoic mass left breast 12 o'clock position 6 cm from nipple. Cortically thickened left axillary lymph node measuring 4 mm. IMPRESSION: 1. Interval development of at least 3 focal areas of architectural distortion within the retroareolar and upper outer left breast. There are multiple new suspicious masses identified within the left breast from 11 o'clock-1 o'clock position as detailed above. 2. Stable postlumpectomy changes right breast. RECOMMENDATION: 1. Ultrasound-guided core needle biopsy left breast mass 12 o'clock position 1 cm from the nipple. 2. Ultrasound-guided core needle biopsy left breast mass 1 o'clock position 5 cm from  nipple. 3. Ultrasound-guided core needle biopsy left axillary lymph node. 4. Patient will need bilateral breast MRI for extent of disease. 5. If breast conservation is being considered, additional biopsies of the above described masses will need to be obtained for extent of disease confirmation. I have discussed the findings and  recommendations with the patient. If applicable, a reminder letter will be sent to the patient regarding the next appointment. BI-RADS CATEGORY  5: Highly suggestive of malignancy. Electronically Signed   By: Lovey Newcomer M.D.   On: 09/15/2020 15:42   MM CLIP PLACEMENT LEFT  Result Date: 09/19/2020 CLINICAL DATA:  Evaluate placement of Q biopsy clip (1 o'clock position mass), VISION biopsy clip (12 o'clock position mass) and HydroMARK biopsy clip (LEFT axillary lymph node) following ultrasound-guided LEFT breast and LEFT axillary biopsies. EXAM: 3D DIAGNOSTIC LEFT MAMMOGRAM POST ULTRASOUND BIOPSY COMPARISON:  Previous exam(s). FINDINGS: 3D Mammographic images were obtained following ultrasound guided biopsies of the 0.4 cm mass at the 1 o'clock position 5 cm from the nipple (Q clip), the 2.2 cm mass at the 12 o'clock position (VISION clip) and a LEFT axillary lymph node (HydroMARK clip). The Q biopsy marking clip is in expected position at the site of biopsy of the 0.4 cm mass at the 1 o'clock position 5 cm from the nipple. The VISION biopsy marking clip is in expected position at the site of biopsy of the 2.2 cm mass at the 12 o'clock position. The Q and VISION clips are separated by a distance of 4.5 cm. The [facility] clip is in expected position at the site of LEFT axillary lymph node biopsy. IMPRESSION: Appropriate positioning of the Q shaped biopsy marking clip at the site of biopsy in the UPPER OUTER LEFT breast. Appropriate positioning of the VISION shaped biopsy marking clip at the site of biopsy in the UPPER LEFT breast. The Q and VISION clips are separated by a distance of 4.5 cm. Appropriate positioning of the HydroMARK clip in the LEFT axilla. Final Assessment: Post Procedure Mammograms for Marker Placement Electronically Signed   By: Margarette Canada M.D.   On: 09/19/2020 10:44   [place] LT BREAST BX W LOC DEV 1ST LESION IMG BX SPEC US GUIDE  Addendum Date: 09/23/2020   ADDENDUM REPORT: 09/23/2020 13:01  ADDENDUM: PATHOLOGY revealed: Site A. LEFT BREAST, 1:00 5CMFN; ULTRASOUND-GUIDED BIOPSY: - INVASIVE MAMMARY CARCINOMA, NO SPECIAL TYPE. 5 mm in this sample. Grade 2. 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 Lymphovascular invasion: Not identified. Pathology results are CONCORDANT with imaging findings, per Dr. Hassan Rowan with surgical excision recommended. PATHOLOGY revealed: Site B. LEFT BREAST, 12:00 1CMFN; ULTRASOUND-GUIDED BIOPSY: - INVASIVE MAMMARY CARCINOMA, AS DESCRIBED ABOVE (MORPHOLOGICALLY SIMILAR TO PART A). Pathology results are CONCORDANT with imaging findings, per Dr. Hassan Rowan. PATHOLOGY revealed: Site C. LYMPH NODE, LEFT AXILLARY; ULTRASOUND-GUIDED BIOPSY: - METASTATIC MAMMARY CARCINOMA, 3 MM IN THIS SAMPLE. Pathology results are CONCORDANT with imaging findings, per Dr. Hassan Rowan. Pathology results and recommendations below were discussed with patient by telephone on 09/22/2020. Patient reported biopsy site within normal limits with slight tenderness at the site. Post biopsy care instructions were reviewed, questions were answered and my direct phone number was provided to patient. Patient was instructed to call [facility] if any concerns or questions arise related to the biopsy. Recommendations: 1. Surgical and oncological consultations: Request for surgical and oncological consultation relayed to Al Pimple RN and Tanya Nones RN at [facility] by Electa Sniff RN on 11/23/2019. Patient  is currently established with Dr. Denny Rodenburg (surgeon) and Dr. Archana Rao (medical oncologist). Nurse navigator, Anne Shaver will contact these providers to inform of patient's current diagnosis. 2. If breast conservation is being considered, additional biopsies of additional LEFT breast masses are indicated, as well as bilateral MRI to assess for extent of breast disease. Pathology results reported by Linda Nash RN on 09/23/2020. Electronically Signed   By: Jeffrey  Hu  M.D.   On: 09/23/2020 13:01   Result Date: 09/23/2020 CLINICAL DATA:  58-year-old female with multiple new masses and areas of distortion within the LEFT breast and an abnormal LEFT axillary lymph node. For tissue sampling of the 0.4 cm 1 o'clock position mass, the 2.2 cm 12 o'clock position mass, and the abnormal LEFT axillary lymph node. EXAM: ULTRASOUND GUIDED LEFT BREAST CORE NEEDLE BIOPSY x 2 ULTRASOUND-GUIDED CORE NEEDLE BIOPSY OF A LEFT AXILLARY LYMPH NODE. COMPARISON:  Previous exam(s). FINDINGS: I met with the patient and we discussed the procedures of ultrasound-guided biopsy, including benefits and alternatives. We discussed the high likelihood of successful procedures. We discussed the risks of the procedures, including infection, bleeding, tissue injury, clip migration, and inadequate sampling. Informed written consent was given. The usual time-out protocol was performed immediately prior to the procedures. ULTRASOUND-GUIDED LEFT BREAST CORE NEEDLE BIOPSY #1 (0.4 cm mass at the 1 o'clock position 5 cm from the nipple - Q clip): Using sterile technique and 1% Lidocaine as local anesthetic, under direct ultrasound visualization, a 12 gauge spring-loaded device was used to perform biopsy of 0.4 cm mass at the 1 o'clock position of the LEFT breast 5 cm from the nipple using a MEDIAL approach. At the conclusion of the procedure a Q tissue marker clip was deployed into the biopsy cavity. Follow up 2 view mammogram was performed and dictated separately. ULTRASOUND-GUIDED LEFT BREAST CORE NEEDLE BIOPSY #2 (2.2 cm mass at the 12 o'clock position - VISION clip): Using sterile technique and 1% Lidocaine as local anesthetic, under direct ultrasound visualization, a 12 gauge spring-loaded device was used to perform biopsy of the 2.2 cm mass at the 12 o'clock position of the LEFT breast using a MEDIAL approach. At the conclusion of the procedure a VISION tissue marker clip was deployed into the biopsy cavity.  Follow up 2 view mammogram was performed and dictated separately. ULTRASOUND-GUIDED CORE NEEDLE BIOPSY OF A LEFT AXILLARY LYMPH NODE (HydroMARK clip): Using sterile technique and 1% Lidocaine as local anesthetic, under direct ultrasound visualization, a 14 gauge spring-loaded device was used to perform biopsy of the LEFT axillary lymph node with cortical thickening using a MEDIAL approach. At the conclusion of the procedure a HydroMARK tissue marker clip was deployed into the biopsy cavity. Follow up 2 view mammogram was performed and dictated separately. IMPRESSION: Ultrasound guided biopsies of a 0.4 cm UPPER-OUTER LEFT breast mass (Q clip), 2.2 cm UPPER LEFT breast mass (VISION clip) and an abnormal LEFT axillary lymph node (HydroMARK clip). No apparent complications. Electronically Signed: By: Jeffrey  Hu M.D. On: 09/19/2020 10:40   US LT BREAST BX W LOC DEV EA ADD LESION IMG BX SPEC US GUIDE  Addendum Date: 09/23/2020   ADDENDUM REPORT: 09/23/2020 13:01 ADDENDUM: PATHOLOGY revealed: Site A. LEFT BREAST, 1:00 5CMFN; ULTRASOUND-GUIDED BIOPSY: - INVASIVE MAMMARY CARCINOMA, NO SPECIAL TYPE. 5 mm in this sample. Grade 2. 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 Lymphovascular invasion: Not identified. Pathology results are CONCORDANT with imaging findings, per Dr. Jeff Hu with surgical excision recommended. PATHOLOGY revealed: Site B.   LEFT BREAST, 12:00 1CMFN; ULTRASOUND-GUIDED BIOPSY: - INVASIVE MAMMARY CARCINOMA, AS DESCRIBED ABOVE (MORPHOLOGICALLY SIMILAR TO PART A). Pathology results are CONCORDANT with imaging findings, per Dr. Hassan Rowan. PATHOLOGY revealed: Site C. LYMPH NODE, LEFT AXILLARY; ULTRASOUND-GUIDED BIOPSY: - METASTATIC MAMMARY CARCINOMA, 3 MM IN THIS SAMPLE. Pathology results are CONCORDANT with imaging findings, per Dr. Hassan Rowan. Pathology results and recommendations below were discussed with patient by telephone on 09/22/2020. Patient reported biopsy site within normal limits with  slight tenderness at the site. Post biopsy care instructions were reviewed, questions were answered and my direct phone number was provided to patient. Patient was instructed to call [facility] if any concerns or questions arise related to the biopsy. Recommendations: 1. Surgical and oncological consultations: Request for surgical and oncological consultation relayed to Al Pimple RN and Tanya Nones RN at [facility] by Electa Sniff RN on 11/23/2019. Patient is currently established with Dr. Budd Palmer (surgeon) and Dr. [name] (medical oncologist). Nurse navigator, Al Pimple will contact these providers to inform of patient's current diagnosis. 2. If breast conservation is being considered, additional biopsies of additional LEFT breast masses are indicated, as well as bilateral MRI to assess for extent of breast disease. Pathology results reported by Electa Sniff RN on 09/23/2020. Electronically Signed   By: Margarette Canada M.D.   On: 09/23/2020 13:01   Result Date: 09/23/2020 CLINICAL DATA:  59 year old female with multiple new masses and areas of distortion within the LEFT breast and an abnormal LEFT axillary lymph node. For tissue sampling of the 0.4 cm 1 o'clock position mass, the 2.2 cm 12 o'clock position mass, and the abnormal LEFT axillary lymph node. EXAM: ULTRASOUND GUIDED LEFT BREAST CORE NEEDLE BIOPSY x 2 ULTRASOUND-GUIDED CORE NEEDLE BIOPSY OF A LEFT AXILLARY LYMPH NODE. COMPARISON:  Previous exam(s). FINDINGS: I met with the patient and we discussed the procedures of ultrasound-guided biopsy, including benefits and alternatives. We discussed the high likelihood of successful procedures. We discussed the risks of the procedures, including infection, bleeding, tissue injury, clip migration, and inadequate sampling. Informed written consent was given. The usual time-out protocol was performed immediately prior to the procedures. ULTRASOUND-GUIDED LEFT BREAST CORE  NEEDLE BIOPSY #1 (0.4 cm mass at the 1 o'clock position 5 cm from the nipple - Q clip): Using sterile technique and 1% Lidocaine as local anesthetic, under direct ultrasound visualization, a 12 gauge spring-loaded device was used to perform biopsy of 0.4 cm mass at the 1 o'clock position of the LEFT breast 5 cm from the nipple using a MEDIAL approach. At the conclusion of the procedure a Q tissue marker clip was deployed into the biopsy cavity. Follow up 2 view mammogram was performed and dictated separately. ULTRASOUND-GUIDED LEFT BREAST CORE NEEDLE BIOPSY #2 (2.2 cm mass at the 12 o'clock position - VISION clip): Using sterile technique and 1% Lidocaine as local anesthetic, under direct ultrasound visualization, a 12 gauge spring-loaded device was used to perform biopsy of the 2.2 cm mass at the 12 o'clock position of the LEFT breast using a MEDIAL approach. At the conclusion of the procedure a VISION tissue marker clip was deployed into the biopsy cavity. Follow up 2 view mammogram was performed and dictated separately. ULTRASOUND-GUIDED CORE NEEDLE BIOPSY OF A LEFT AXILLARY LYMPH NODE (HydroMARK clip): Using sterile technique and 1% Lidocaine as local anesthetic, under direct ultrasound visualization, a 14 gauge spring-loaded device was used to perform biopsy of the LEFT axillary lymph node with cortical thickening using a MEDIAL  approach. At the conclusion of the procedure a HydroMARK tissue marker clip was deployed into the biopsy cavity. Follow up 2 view mammogram was performed and dictated separately. IMPRESSION: Ultrasound guided biopsies of a 0.4 cm UPPER-OUTER LEFT breast mass (Q clip), 2.2 cm UPPER LEFT breast mass (VISION clip) and an abnormal LEFT axillary lymph node (HydroMARK clip). No apparent complications. Electronically Signed: By: Jeffrey  Hu M.D. On: 09/19/2020 10:40   US LT BREAST BX W LOC DEV EA ADD LESION IMG BX SPEC US GUIDE  Addendum Date: 09/23/2020   ADDENDUM REPORT: 09/23/2020  13:01 ADDENDUM: PATHOLOGY revealed: Site A. LEFT BREAST, 1:00 5CMFN; ULTRASOUND-GUIDED BIOPSY: - INVASIVE MAMMARY CARCINOMA, NO SPECIAL TYPE. 5 mm in this sample. Grade 2. 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 Lymphovascular invasion: Not identified. Pathology results are CONCORDANT with imaging findings, per Dr. Jeff Hu with surgical excision recommended. PATHOLOGY revealed: Site B. LEFT BREAST, 12:00 1CMFN; ULTRASOUND-GUIDED BIOPSY: - INVASIVE MAMMARY CARCINOMA, AS DESCRIBED ABOVE (MORPHOLOGICALLY SIMILAR TO PART A). Pathology results are CONCORDANT with imaging findings, per Dr. Jeff Hu. PATHOLOGY revealed: Site C. LYMPH NODE, LEFT AXILLARY; ULTRASOUND-GUIDED BIOPSY: - METASTATIC MAMMARY CARCINOMA, 3 MM IN THIS SAMPLE. Pathology results are CONCORDANT with imaging findings, per Dr. Jeff Hu. Pathology results and recommendations below were discussed with patient by telephone on 09/22/2020. Patient reported biopsy site within normal limits with slight tenderness at the site. Post biopsy care instructions were reviewed, questions were answered and my direct phone number was provided to patient. Patient was instructed to call [facility] if any concerns or questions arise related to the biopsy. Recommendations: 1. Surgical and oncological consultations: Request for surgical and oncological consultation relayed to Anne Shaver RN and Sheena Lambert RN at [facility] by Linda Nash RN on 11/23/2019. Patient is currently established with Dr. Denny Rodenburg (surgeon) and Dr. Archana Rao (medical oncologist). Nurse navigator, Anne Shaver will contact these providers to inform of patient's current diagnosis. 2. If breast conservation is being considered, additional biopsies of additional LEFT breast masses are indicated, as well as bilateral MRI to assess for extent of breast disease. Pathology results reported by Linda Nash RN on 09/23/2020. Electronically Signed   By: Jeffrey   Hu M.D.   On: 09/23/2020 13:01   Result Date: 09/23/2020 CLINICAL DATA:  58-year-old female with multiple new masses and areas of distortion within the LEFT breast and an abnormal LEFT axillary lymph node. For tissue sampling of the 0.4 cm 1 o'clock position mass, the 2.2 cm 12 o'clock position mass, and the abnormal LEFT axillary lymph node. EXAM: ULTRASOUND GUIDED LEFT BREAST CORE NEEDLE BIOPSY x 2 ULTRASOUND-GUIDED CORE NEEDLE BIOPSY OF A LEFT AXILLARY LYMPH NODE. COMPARISON:  Previous exam(s). FINDINGS: I met with the patient and we discussed the procedures of ultrasound-guided biopsy, including benefits and alternatives. We discussed the high likelihood of successful procedures. We discussed the risks of the procedures, including infection, bleeding, tissue injury, clip migration, and inadequate sampling. Informed written consent was given. The usual time-out protocol was performed immediately prior to the procedures. ULTRASOUND-GUIDED LEFT BREAST CORE NEEDLE BIOPSY #1 (0.4 cm mass at the 1 o'clock position 5 cm from the nipple - Q clip): Using sterile technique and 1% Lidocaine as local anesthetic, under direct ultrasound visualization, a 12 gauge spring-loaded device was used to perform biopsy of 0.4 cm mass at the 1 o'clock position of the LEFT breast 5 cm from the nipple using a MEDIAL approach. At the conclusion of the procedure   a Q tissue marker clip was deployed into the biopsy cavity. Follow up 2 view mammogram was performed and dictated separately. ULTRASOUND-GUIDED LEFT BREAST CORE NEEDLE BIOPSY #2 (2.2 cm mass at the 12 o'clock position - VISION clip): Using sterile technique and 1% Lidocaine as local anesthetic, under direct ultrasound visualization, a 12 gauge spring-loaded device was used to perform biopsy of the 2.2 cm mass at the 12 o'clock position of the LEFT breast using a MEDIAL approach. At the conclusion of the procedure a VISION tissue marker clip was deployed into the biopsy cavity.  Follow up 2 view mammogram was performed and dictated separately. ULTRASOUND-GUIDED CORE NEEDLE BIOPSY OF A LEFT AXILLARY LYMPH NODE (HydroMARK clip): Using sterile technique and 1% Lidocaine as local anesthetic, under direct ultrasound visualization, a 14 gauge spring-loaded device was used to perform biopsy of the LEFT axillary lymph node with cortical thickening using a MEDIAL approach. At the conclusion of the procedure a HydroMARK tissue marker clip was deployed into the biopsy cavity. Follow up 2 view mammogram was performed and dictated separately. IMPRESSION: Ultrasound guided biopsies of a 0.4 cm UPPER-OUTER LEFT breast mass (Q clip), 2.2 cm UPPER LEFT breast mass (VISION clip) and an abnormal LEFT axillary lymph node (HydroMARK clip). No apparent complications. Electronically Signed: By: Margarette Canada M.D. On: 09/19/2020 10:40    Assessment and plan- Patient is a 59 y.o. 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  If HER-2 comes back positive patient would definitely benefit from neoadjuvant chemotherapy  And HER-2 FISH testing is negative, patient has an ER/PR positive HER-2 negative tumor which is biologically favorable.  Based on ultrasound and mammogram we are only seeing 1 solitary abnormal lymph node.  I would like to see what the MRI of her bilateral breast shows and if there is any more disease than what we already know on ultrasound and mammogram.  Patient had a normal mammogram a year ago and now has multifocal disease with lymph node involvement suggestive of fairly fast-growing tumor.  I have ordered Ki-67 on her pathology sample as well.  Based on all these factors I will decide if patient would benefit from neoadjuvant chemotherapy regardless versus MammaPrint testing on the biopsy specimen.  I will also  obtain a PET CT scan to complete her staging work-up.  I will see her back after PET CT scan and MRI is done to discuss further management  With history of recurrent breast cancer she would also benefit from genetics referral  Thank you for this kind referral and the opportunity to participate in the care of this patient   Visit Diagnosis 1. Malignant neoplasm of upper-outer quadrant of right breast in female, estrogen receptor positive ([place])     Dr. [name], MD, MPH [facility] at [facility] [phone] 10/09/2020  4:03 PM

## 2020-10-10 ENCOUNTER — Other Ambulatory Visit: Payer: Self-pay

## 2020-10-10 ENCOUNTER — Encounter: Payer: Self-pay | Admitting: Oncology

## 2020-10-10 DIAGNOSIS — C50411 Malignant neoplasm of upper-outer quadrant of right female breast: Secondary | ICD-10-CM

## 2020-10-15 ENCOUNTER — Ambulatory Visit: Payer: 59 | Admitting: Orthotics

## 2020-10-15 ENCOUNTER — Other Ambulatory Visit: Payer: Self-pay

## 2020-10-15 DIAGNOSIS — M722 Plantar fascial fibromatosis: Secondary | ICD-10-CM

## 2020-10-15 NOTE — Progress Notes (Signed)
Patient came in today to pick up custom made foot orthotics.  The goals were accomplished and the patient reported no dissatisfaction with said orthotics.  Patient was advised of breakin period and how to report any issues. 

## 2020-10-16 ENCOUNTER — Encounter: Payer: Self-pay | Admitting: Licensed Clinical Social Worker

## 2020-10-16 ENCOUNTER — Inpatient Hospital Stay (HOSPITAL_BASED_OUTPATIENT_CLINIC_OR_DEPARTMENT_OTHER): Payer: 59 | Admitting: Licensed Clinical Social Worker

## 2020-10-16 ENCOUNTER — Inpatient Hospital Stay: Payer: 59

## 2020-10-16 ENCOUNTER — Ambulatory Visit
Admission: RE | Admit: 2020-10-16 | Discharge: 2020-10-16 | Disposition: A | Discharge status: Home / Self Care | Payer: 59 | Source: Ambulatory Visit | Attending: Surgery | Admitting: Surgery

## 2020-10-16 DIAGNOSIS — Z170 Estrogen receptor positive status [ER+]: Secondary | ICD-10-CM | POA: Insufficient documentation

## 2020-10-16 DIAGNOSIS — C50412 Malignant neoplasm of upper-outer quadrant of left female breast: Secondary | ICD-10-CM | POA: Insufficient documentation

## 2020-10-16 DIAGNOSIS — C50411 Malignant neoplasm of upper-outer quadrant of right female breast: Secondary | ICD-10-CM | POA: Diagnosis not present

## 2020-10-16 MED ORDER — GADOBUTROL 1 MMOL/ML IV SOLN
8.0000 mL | Freq: Once | INTRAVENOUS | Status: AC | PRN
  Administered 2020-10-16: 8 mL via INTRAVENOUS

## 2020-10-16 NOTE — Progress Notes (Signed)
REFERRING PROVIDER: Sindy Guadeloupe, MD [place],  [place] [id number]  PRIMARY PROVIDER:  Casilda Carls, MD  PRIMARY REASON FOR VISIT:  1. Malignant neoplasm of upper-outer quadrant of right female breast, unspecified estrogen receptor status ([place])      HISTORY OF PRESENT ILLNESS:   Ms. Weissman, a 59 y.o. female, was seen for a [place] cancer genetics consultation at the request of Dr. Janese Banks due to a personal history of breast cancer.  Ms. Brossard presents to clinic today to discuss the possibility of a hereditary predisposition to cancer, genetic testing, and to further clarify her future cancer risks, as well as potential cancer risks for family members.   In 2016, at the age of 49, Ms. Gittings was diagnosed with invasive lobular carcinoma of the right breast. This was treated with lumpectomy, radiation and letrozole. In 2021, at the age of 33, Ms. Harries was diagnosed with invasive mammary carcinoma of the left breast, ER/PR+, Her2-.   CANCER HISTORY:  Oncology History   No history exists.     RISK FACTORS:  Menarche was at age 25.  First live birth at age 77.  OCP use for approximately 1 years.  Ovaries intact: yes.  Hysterectomy: no.  HRT use: 0 years. Colonoscopy: yes; reports about 4 polyps . Mammogram within the last year: yes. Number of breast biopsies: 2.   Past Medical History:  Diagnosis Date  . Anemia   . Arthritis   . Breast cancer ([place]) 08/2015   1.5 mm invasive lobular cancer, 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 Wide excision/ RT, T1a,N0. ER/PR pos, Her 2.neg. Tamoxifen x 6 months, d/c secondary to vasomotor sympotms.   . Headache   . Hypertension   . Personal history of radiation therapy 2016   RIGHT lumpectomy  . Thyroid disease     Past Surgical History:  Procedure Laterality Date  . ANTERIOR CRUCIATE LIGAMENT REPAIR Right   . BREAST BIOPSY Right 07/17/2015   INVASIVE LOBULAR CARCINOMA, CLASSIC TYPE (1.5 MM), ARISING IN A   . BREAST BIOPSY Left  09/19/2020   [place] bx 3 areas/ 1oc q clip/ 12 oc vision clip, axilla lymph node hydromark shape 3/ path pending  . BREAST LUMPECTOMY Right 08/22/2015  . BREAST LUMPECTOMY WITH SENTINEL LYMPH NODE BIOPSY Right 08/22/2015   Procedure: BREAST LUMPECTOMY WITH SENTINEL LYMPH NODE BX;  Surgeon: Christene Lye, MD;  Location: [facility];  Service: General;  Laterality: Right;  . COLONOSCOPY W/ POLYPECTOMY  2014  . COLONOSCOPY WITH PROPOFOL N/A 04/16/2019   Procedure: COLONOSCOPY WITH BIOPSIES;  Surgeon: Lucilla Lame, MD;  Location: [facility];  Service: Endoscopy;  Laterality: N/A;  . DILATION AND CURETTAGE OF UTERUS     20 years ago  . NOVASURE ABLATION    . POLYPECTOMY N/A 04/16/2019   Procedure: POLYPECTOMY;  Surgeon: Lucilla Lame, MD;  Location: [facility];  Service: Endoscopy;  Laterality: N/A;    Social History   Socioeconomic History  . Marital status: Married    Spouse name: Not on file  . Number of children: Not on file  . Years of education: Not on file  . Highest education level: Not on file  Occupational History  . Not on file  Tobacco Use  . Smoking status: Current Every Day Smoker    Packs/day: 0.50    Years: 40.00    Pack years: 20.00    Types: Cigarettes  . Smokeless tobacco: Never Used  Vaping Use  . Vaping Use: Never  used  Substance and Sexual Activity  . Alcohol use: Yes    Alcohol/week: 2.0 standard drinks    Types: 2 Standard drinks or equivalent per week    Comment: BEER 1-2 QD  . Drug use: No  . Sexual activity: Yes    Comment: ablation  Other Topics Concern  . Not on file  Social History Narrative  . Not on file   Social Determinants of Health   Financial Resource Strain: Not on file  Food Insecurity: Not on file  Transportation Needs: Not on file  Physical Activity: Not on file  Stress: Not on file  Social Connections: Not on file     FAMILY HISTORY:  We obtained a detailed, 4-generation family history.  Significant  diagnoses are listed below: Family History  Problem Relation Age of Onset  . Stroke Mother   . Cancer Sister        sarcoma on arm; chemo  . Diabetes Sister   . Stroke Sister   . Diabetes Brother   . Breast cancer Neg Hx   . Ovarian cancer Neg Hx   . Colon cancer Neg Hx   . Heart disease Neg Hx    Ms. Beringer has a son, 49 and a daughter, 48, no history of cancer. She has 3 grandchildren. She had 7 brothers and 4 sisters. One sister had sarcoma on her arm at 76 and living at 63.  Patient is unaware of any other family history of cancer. She had 18 maternal aunts/uncles, and 22 paternal aunts/uncles. All grandparents passed over the age of 44 with no known cancer history.  Ms. Mccorvey is unaware of previous family history of genetic testing for hereditary cancer risks. There is no reported Ashkenazi Jewish ancestry. There is no known consanguinity.    GENETIC COUNSELING ASSESSMENT: Ms. Viscomi is a 59 y.o. female with a personal history of breast cancer which is somewhat suggestive of a hereditary cancer syndrome and predisposition to cancer. We, therefore, discussed and recommended the following at today's visit.   DISCUSSION: We discussed that approximately 5-10% of breast  cancer is hereditary  Most cases of hereditary breast cancer are associated with BRCA1/BRCA2 genes, although there are other genes associated with hereditary  cancer as well including.  We discussed that testing is beneficial for several reasons including  knowing about other cancer risks, identifying potential screening and risk-reduction options that may be appropriate, and to understand if other family members could be at risk for cancer and allow them to undergo genetic testing.   We reviewed the characteristics, features and inheritance patterns of hereditary cancer syndromes. We also discussed genetic testing, including the appropriate family members to test, the process of testing, insurance coverage and turn-around-time  for results. We discussed the implications of a negative, positive and/or variant of uncertain significant result. We recommended Ms. Spelman pursue genetic testing for the [place] CancerNext-Expanded+RNA gene panel.   Based on Ms. Depace personal history of cancer, she meets medical criteria for genetic testing. Despite that she meets criteria, she may still have an out of pocket cost. We discussed that if her out of pocket cost for testing is over $100, the laboratory will call and confirm whether she wants to proceed with testing.  If the out of pocket cost of testing is less than $100 she will be billed by the genetic testing laboratory.   PLAN: After considering the risks, benefits, and limitations, Ms. Doolan did not wish to pursue genetic testing at today's visit.  We understand this decision and remain available to coordinate genetic testing at any time in the future. We, therefore, recommend Ms. Bordeaux continue to follow the cancer screening guidelines given by her primary healthcare provider.  Ms. Padia questions were answered to her satisfaction today. Our contact information was provided should additional questions or concerns arise. Thank you for the referral and allowing [place] to share in the care of your patient.   Faith Rogue, MS, [facility] Genetic Counselor [email or web address] Phone: [phone]  The patient was seen for a total of 25 minutes in face-to-face genetic counseling.  Dr. Grayland Ormond was available for discussion regarding this case.   _______________________________________________________________________ For Office Staff:  Number of people involved in session: 1 Was an Intern/ student involved with case: no

## 2020-10-28 ENCOUNTER — Encounter
Admission: RE | Admit: 2020-10-28 | Discharge: 2020-10-28 | Disposition: A | Discharge status: Home / Self Care | Payer: 59 | Source: Ambulatory Visit | Attending: Oncology | Admitting: Oncology

## 2020-10-28 ENCOUNTER — Ambulatory Visit
Admission: RE | Admit: 2020-10-28 | Discharge: 2020-10-28 | Disposition: A | Discharge status: Home / Self Care | Payer: 59 | Source: Ambulatory Visit | Attending: Oncology | Admitting: Oncology

## 2020-10-28 ENCOUNTER — Other Ambulatory Visit: Payer: Self-pay

## 2020-10-28 DIAGNOSIS — Z170 Estrogen receptor positive status [ER+]: Secondary | ICD-10-CM | POA: Insufficient documentation

## 2020-10-28 DIAGNOSIS — C50411 Malignant neoplasm of upper-outer quadrant of right female breast: Secondary | ICD-10-CM

## 2020-10-28 LAB — POCT I-STAT CREATININE: Creatinine, Ser: 0.7 mg/dL (ref 0.44–1.00)

## 2020-10-28 MED ORDER — TECHNETIUM TC 99M MEDRONATE IV KIT
20.0000 | PACK | Freq: Once | INTRAVENOUS | Status: AC | PRN
  Administered 2020-10-28: 20.561 via INTRAVENOUS

## 2020-10-28 MED ORDER — IOHEXOL 300 MG/ML  SOLN
100.0000 mL | Freq: Once | INTRAMUSCULAR | Status: AC | PRN
  Administered 2020-10-28: 100 mL via INTRAVENOUS

## 2020-10-29 ENCOUNTER — Encounter: Payer: Self-pay | Admitting: Oncology

## 2020-10-30 ENCOUNTER — Encounter: Payer: Self-pay | Admitting: Oncology

## 2020-11-03 ENCOUNTER — Encounter: Payer: Self-pay | Admitting: Oncology

## 2020-11-03 ENCOUNTER — Other Ambulatory Visit: Payer: Self-pay | Admitting: *Deleted

## 2020-11-03 DIAGNOSIS — C50411 Malignant neoplasm of upper-outer quadrant of right female breast: Secondary | ICD-10-CM

## 2020-11-04 ENCOUNTER — Inpatient Hospital Stay: Payer: 59 | Attending: Oncology | Admitting: Oncology

## 2020-11-04 ENCOUNTER — Encounter: Payer: Self-pay | Admitting: Oncology

## 2020-11-04 ENCOUNTER — Inpatient Hospital Stay: Payer: 59

## 2020-11-04 VITALS — BP 148/92 | HR 86 | Temp 98.7°F | Resp 20 | Wt 192.0 lb

## 2020-11-04 DIAGNOSIS — C50411 Malignant neoplasm of upper-outer quadrant of right female breast: Secondary | ICD-10-CM | POA: Insufficient documentation

## 2020-11-04 DIAGNOSIS — Z5111 Encounter for antineoplastic chemotherapy: Secondary | ICD-10-CM

## 2020-11-04 DIAGNOSIS — C773 Secondary and unspecified malignant neoplasm of axilla and upper limb lymph nodes: Secondary | ICD-10-CM | POA: Diagnosis not present

## 2020-11-04 DIAGNOSIS — I10 Essential (primary) hypertension: Secondary | ICD-10-CM | POA: Insufficient documentation

## 2020-11-04 DIAGNOSIS — Z79899 Other long term (current) drug therapy: Secondary | ICD-10-CM | POA: Diagnosis not present

## 2020-11-04 DIAGNOSIS — Z923 Personal history of irradiation: Secondary | ICD-10-CM | POA: Diagnosis not present

## 2020-11-04 DIAGNOSIS — Z170 Estrogen receptor positive status [ER+]: Secondary | ICD-10-CM | POA: Insufficient documentation

## 2020-11-04 DIAGNOSIS — Z7189 Other specified counseling: Secondary | ICD-10-CM | POA: Diagnosis not present

## 2020-11-04 DIAGNOSIS — C50812 Malignant neoplasm of overlapping sites of left female breast: Secondary | ICD-10-CM

## 2020-11-04 DIAGNOSIS — C50912 Malignant neoplasm of unspecified site of left female breast: Secondary | ICD-10-CM | POA: Insufficient documentation

## 2020-11-04 DIAGNOSIS — D649 Anemia, unspecified: Secondary | ICD-10-CM | POA: Diagnosis not present

## 2020-11-04 DIAGNOSIS — F17210 Nicotine dependence, cigarettes, uncomplicated: Secondary | ICD-10-CM | POA: Insufficient documentation

## 2020-11-04 MED ORDER — ONDANSETRON HCL 8 MG PO TABS: 8.0000 mg | ORAL_TABLET | Freq: Two times a day (BID) | ORAL | 1 refills | Status: DC | PRN

## 2020-11-04 MED ORDER — LIDOCAINE-PRILOCAINE 2.5-2.5 % EX CREA: TOPICAL_CREAM | CUTANEOUS | 3 refills | Status: DC

## 2020-11-04 MED ORDER — PROCHLORPERAZINE MALEATE 10 MG PO TABS: 10.0000 mg | ORAL_TABLET | Freq: Four times a day (QID) | ORAL | 1 refills | Status: DC | PRN

## 2020-11-04 MED ORDER — LORAZEPAM 0.5 MG PO TABS: 0.5000 mg | ORAL_TABLET | Freq: Four times a day (QID) | ORAL | 0 refills | Status: DC | PRN

## 2020-11-04 MED ORDER — DEXAMETHASONE 4 MG PO TABS: 8.0000 mg | ORAL_TABLET | Freq: Every day | ORAL | 1 refills | Status: DC

## 2020-11-04 NOTE — Progress Notes (Signed)
Hematology/Oncology Consult note [facility]  Telephone:([phone] [phone]  Patient Care Team: Casilda Carls, MD as PCP - General (Internal Medicine) Casilda Carls, MD as Consulting Physician (Internal Medicine) Christene Lye, MD (General Surgery) Garrel Ridgel, DPM as Consulting Physician (Podiatry)   Name of the patient: Michelle Walker  [id number]  [birth year]   Date of visit: 11/04/20  Diagnosis-recurrent left breast cancer stage II [id number] ER/PR positive HER-2 negative  Chief complaint/ Reason for visit-discuss MammaPrint and MRI results and further management  Heme/Onc history: Patient is a 60 year old female who was diagnosed with 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  She did not require adjuvant chemotherapy.  She was initially on tamoxifen which she could not tolerate and was switched to letrozole.  Last seen by me in June 2019 at which point she had a menstrual cycle and therefore not given another AI.  She had subsequently did not follow-up with me and remained off hormone therapy.    More recently patient underwent a diagnostic bilateral mammogram which showed a 2.2 x 1.9 x 1.9 cm mass at the 12 o'clock position of the left breast 1 cm from the nipple.  She was also found to have 5 other smaller breast masses ranging from 0.3 to 0.8 cm.  Noted to have a solitary left axillary lymph node with cortical thickening.  She had a biopsy of the dominant left breast mass as well as another smaller breast mass and the left axillary lymph node all of which were positive for metastatic invasive mammary carcinoma grade 2.  Tumor was ER 91 200% positive PR 71 to 80% positive and HER-2 IHC equivocal but negative by FISH  MRI of the bilateral breasts showed no abnormal findings in the right breast.  At least 6 discrete hypoechoic masses in the left breast with the  largest one measuring 2.2 cm with 1 abnormal left axillary lymph node.  The size of the other breast masses ranging from 66m to 1.4 cm.  The total area of masses and non-mass enhancement was about 10 x 5 x 6.5 cm.  CT chest abdomen and pelvis with contrast showed no evidence of distant metastatic disease.  Bone scan was also negative.  MammaPrint done on the biopsy specimen came back as high risk with greater than 12% chemotherapy benefit  Interval history-she is anxious today to review the results of her scans and tests.  She continues to be active and independent of her ADLs and IADLs  ECOG PS- 1 Pain scale- 0 Opioid associated constipation- no  Review of systems- Review of Systems  Constitutional: Negative for chills, fever, malaise/fatigue and weight loss.  HENT: Negative for congestion, ear discharge and nosebleeds.   Eyes: Negative for blurred vision.  Respiratory: Negative for cough, hemoptysis, sputum production, shortness of breath and wheezing.   Cardiovascular: Negative for chest pain, palpitations, orthopnea and claudication.  Gastrointestinal: Negative for abdominal pain, blood in stool, constipation, diarrhea, heartburn, melena, nausea and vomiting.  Genitourinary: Negative for dysuria, flank pain, frequency, hematuria and urgency.  Musculoskeletal: Negative for back pain, joint pain and myalgias.  Skin: Negative for rash.  Neurological: Negative for dizziness, tingling, focal weakness, seizures, weakness and headaches.  Endo/Heme/Allergies: Does not bruise/bleed easily.  Psychiatric/Behavioral: Negative for depression and suicidal ideas. The patient is nervous/anxious. The patient does not have insomnia.        Allergies  Allergen Reactions  .  Triamcinolone Other (See Comments)    Hypopigmentation s/p steroid injection  . Tape Rash    SURGICAL TAPE AFTER BREAST BIOPSY     Past Medical History:  Diagnosis Date  . Anemia   . Arthritis   . 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 Wide excision/ RT, T1a,N0. ER/PR pos, Her 2.neg. Tamoxifen x 6 months, d/c secondary to vasomotor sympotms.   . Headache   . Hypertension   . Personal history of radiation therapy 2016   RIGHT lumpectomy  . Thyroid disease      Past Surgical History:  Procedure Laterality Date  . ANTERIOR CRUCIATE LIGAMENT REPAIR Right   . BREAST BIOPSY Right 07/17/2015   INVASIVE LOBULAR CARCINOMA, CLASSIC TYPE (1.5 MM), ARISING IN A   . BREAST BIOPSY Left 09/19/2020   [place] bx 3 areas/ 1oc q clip/ 12 oc vision clip, axilla lymph node hydromark shape 3/ path pending  . BREAST LUMPECTOMY Right 08/22/2015  . BREAST LUMPECTOMY WITH SENTINEL LYMPH NODE BIOPSY Right 08/22/2015   Procedure: BREAST LUMPECTOMY WITH SENTINEL LYMPH NODE BX;  Surgeon: Christene Lye, MD;  Location: [facility];  Service: General;  Laterality: Right;  . COLONOSCOPY W/ POLYPECTOMY  2014  . COLONOSCOPY WITH PROPOFOL N/A 04/16/2019   Procedure: COLONOSCOPY WITH BIOPSIES;  Surgeon: Lucilla Lame, MD;  Location: [place];  Service: Endoscopy;  Laterality: N/A;  . DILATION AND CURETTAGE OF UTERUS     20 years ago  . NOVASURE ABLATION    . POLYPECTOMY N/A 04/16/2019   Procedure: POLYPECTOMY;  Surgeon: Lucilla Lame, MD;  Location: [place];  Service: Endoscopy;  Laterality: N/A;    Social History   Socioeconomic History  . Marital status: Married    Spouse name: Not on file  . Number of children: Not on file  . Years of education: Not on file  . Highest education level: Not on file  Occupational History  . Not on file  Tobacco Use  . Smoking status: Current Every Day Smoker    Packs/day: 0.50    Years: 40.00    Pack years: 20.00    Types: Cigarettes  . Smokeless tobacco: Never Used  Vaping Use  . Vaping Use: Never used  Substance and Sexual Activity  . Alcohol use: Yes    Alcohol/week: 2.0 standard drinks    Types: 2 Standard drinks or  equivalent per week    Comment: BEER 1-2 QD  . Drug use: No  . Sexual activity: Yes    Comment: ablation  Other Topics Concern  . Not on file  Social History Narrative  . Not on file   Social Determinants of Health   Financial Resource Strain: Not on file  Food Insecurity: Not on file  Transportation Needs: Not on file  Physical Activity: Not on file  Stress: Not on file  Social Connections: Not on file  Intimate Partner Violence: Not on file    Family History  Problem Relation Age of Onset  . Stroke Mother   . Cancer Sister        sarcoma on arm; chemo  . Diabetes Sister   . Stroke Sister   . Diabetes Brother   . Breast cancer Neg Hx   . Ovarian cancer Neg Hx   . Colon cancer Neg Hx   . Heart disease Neg Hx      Current Outpatient Medications:  .  allopurinol (ZYLOPRIM) 100 MG tablet, Take  100 mg by mouth at bedtime., Disp: , Rfl:  .  amLODipine (NORVASC) 10 MG tablet, Take 10 mg by mouth at bedtime. , Disp: , Rfl: 0 .  calcium-vitamin D (OSCAL WITH D) 500-200 MG-UNIT tablet, Take 1 tablet by mouth daily with breakfast., Disp: , Rfl:  .  D3-50 1.25 MG (50000 UT) capsule, Take 50,000 Units by mouth once a week., Disp: , Rfl:  .  losartan (COZAAR) 100 MG tablet, Take 100 mg by mouth at bedtime. , Disp: , Rfl: 0 .  metoprolol succinate (TOPROL-XL) 25 MG 24 hr tablet, Take 25 mg by mouth at bedtime. , Disp: , Rfl: 0 .  PROVENTIL HFA 108 (90 Base) MCG/ACT inhaler, Inhale 2 puffs into the lungs every 6 (six) hours as needed. , Disp: , Rfl:   Physical exam:  Vitals:   11/04/20 1016  BP: (!) 148/92  Pulse: 86  Resp: 20  Temp: 98.7 F (37.1 C)  TempSrc: Tympanic  Weight: 192 lb (87.1 kg)   Physical Exam Constitutional:      General: She is not in acute distress. Eyes:     Extraocular Movements: EOM normal.  Cardiovascular:     Rate and Rhythm: Normal rate and regular rhythm.     Heart sounds: Normal heart sounds.  Pulmonary:     Effort: Pulmonary effort is  normal.     Breath sounds: Normal breath sounds.  Abdominal:     General: Bowel sounds are normal.     Palpations: Abdomen is soft.  Skin:    General: Skin is warm and dry.  Neurological:     Mental Status: She is alert and oriented to person, place, and time.      CMP Latest Ref Rng & Units 10/28/2020  Glucose 65 - 99 mg/dL -  BUN 6 - 20 mg/dL -  Creatinine 0.44 - 1.00 mg/dL 0.70  Sodium 135 - 145 mmol/L -  Potassium 3.5 - 5.1 mmol/L -  Chloride 101 - 111 mmol/L -  CO2 22 - 32 mmol/L -  Calcium 8.9 - 10.3 mg/dL -  Total Protein 6.5 - 8.1 g/dL -  Total Bilirubin 0.3 - 1.2 mg/dL -  Alkaline Phos 38 - 126 U/L -  AST 15 - 41 U/L -  ALT 14 - 54 U/L -   CBC Latest Ref Rng & Units 10/08/2015  WBC 3.6 - 11.0 K/uL 4.6  Hemoglobin 12.0 - 16.0 g/dL 13.7  Hematocrit 35.0 - 47.0 % 42.2  Platelets 150 - 440 K/uL 176    No images are attached to the encounter.  NM Bone Scan Whole Body  Result Date: 10/29/2020 CLINICAL DATA:  Breast carcinoma EXAM: NUCLEAR MEDICINE WHOLE BODY BONE SCAN TECHNIQUE: Whole body anterior and posterior images were obtained approximately 3 hours after intravenous injection of radiopharmaceutical. RADIOPHARMACEUTICALS:  20.561 mCi Technetium-65mMDP IV COMPARISON:  CT chest, abdomen, and pelvis October 28, 2020 FINDINGS: There is increased radiotracer uptake in the wrists, shoulders, and right midfoot region, findings likely of arthropathic etiology. There are no findings suggesting bony metastatic disease. Kidneys noted in the flank positions bilaterally. IMPRESSION: No demonstrable bony metastatic disease. Increased uptake in major joints is likely of arthropathic etiology. Electronically Signed   By: WLowella GripIII M.D.   On: 10/29/2020 08:20   CT CHEST ABDOMEN PELVIS W CONTRAST  Result Date: 10/28/2020 CLINICAL DATA:  Left breast cancer initial workup, prior right lumpectomy in 2016. EXAM: CT CHEST, ABDOMEN, AND PELVIS WITH CONTRAST TECHNIQUE:  Multidetector  CT imaging of the chest, abdomen and pelvis was performed following the standard protocol during bolus administration of intravenous contrast. CONTRAST:  146m OMNIPAQUE IOHEXOL 300 MG/ML  SOLN COMPARISON:  Breast MRI dated 12/18/2019 and CT abdomen from 08/05/2015 FINDINGS: CT CHEST FINDINGS Cardiovascular: Coronary, aortic arch, and branch vessel atherosclerotic vascular disease. Mediastinum/Nodes: Right lower paratracheal node 1.0 cm in short axis, borderline prominent, image 25 series 2. Indistinct nodularity in the left thyroid including a 1.2 cm thyroid nodule previously worked up on thyroid ultrasound of 04/20/2005. Not clinically significant; no follow-up imaging recommended (ref: J Am Coll Radiol. 2015 Feb;12(2): 143-50). Lungs/Pleura: Subtle reticulonodular opacity in the left upper lobe and superior segment left lower lobe probably represents minimal atypical infectious process. Musculoskeletal: Thoracic spondylosis. Clips are noted in the left breast. Post lumpectomy findings in the right breast. CT ABDOMEN PELVIS FINDINGS Hepatobiliary: Punctate calcification posteriorly in the right hepatic lobe on image 62 series 2, probably due to remote inflammatory process, and unchanged from 2016. Mildly contracted gallbladder. No biliary dilatation. Pancreas: Unremarkable Spleen: Unremarkable Adrenals/Urinary Tract: Both adrenal glands appear normal. 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 Urinary bladder unremarkable. Stomach/Bowel: Colonic diverticulosis most striking in the distal descending colon, sigmoid colon, and cecum/ascending colon. There also a few small diverticula of the terminal ileum. Normal appendix. No findings of active diverticulitis. Wall thickening in the proximal sigmoid colon is likely associated with the diverticular disease. Vascular/Lymphatic: Aortoiliac atherosclerotic vascular disease. No pathologic adenopathy. Reproductive:  Unremarkable Other: No supplemental non-categorized findings. Musculoskeletal: Degenerative disc disease at L5-S1. IMPRESSION: 1. No specific findings of metastatic disease to the chest, abdomen, or pelvis. There is a borderline prominent right lower paratracheal lymph node which merit surveillance. 2. Subtle reticulonodular opacity in the left upper lobe and superior segment left lower lobe probably represents minimal atypical infectious process. 3. Other imaging findings of potential clinical significance: Coronary, aortic arch, and branch vessel atherosclerotic vascular disease. Post lumpectomy findings in the right breast. 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 Degenerative disc disease at L5-S1. 4. Aortic atherosclerosis. Aortic Atherosclerosis (ICD10-I70.0). Electronically Signed   By: WVan ClinesM.D.   On: 10/28/2020 15:10   MR Breast Bilateral W Wo Contrast  Result Date: 10/16/2020 CLINICAL DATA:  Patient seen for post right lumpectomy diagnostic mammographic surveillance on 09/15/2020. Three areas of architectural distortion were noted in the left breast. Subsequent ultrasound showed 6 discrete hypoechoic masses, the largest at 12 o'clock, 1 cm from the nipple, 2.2 cm, as well as a mildly thickened left axillary lymph node, cortex 4 mm. On 09/19/2020, the patient underwent ultrasound-guided core needle biopsy of the mass at 12 o'clock, 1 cm from the nipple and the 4 mm mass at 1 o'clock, 5 cm the nipple, and the abnormal lymph node, all biopsies revealing carcinoma. Patient presents for assessment of extent of disease. LABS:  No labs drawn at time of imaging. EXAM: BILATERAL BREAST MRI WITH AND WITHOUT CONTRAST TECHNIQUE: Multiplanar, multisequence MR images of both breasts were obtained prior to and following the intravenous administration of 8 ml of Gadavist Three-dimensional MR images were rendered by post-processing of the original MR data on an independent  workstation. The three-dimensional MR images were interpreted, and findings are reported in the following complete MRI report for this study. Three dimensional images were evaluated at the independent interpreting workstation using the DynaCAD thin client. COMPARISON:  Previous exam(s). FINDINGS: Breast composition: b. Scattered  fibroglandular tissue. Background parenchymal enhancement: Minimal Right breast: No mass or abnormal enhancement. There is post lumpectomy architectural distortion. Left breast: Multiple enhancing masses with intervening areas of non mass enhancement are noted. 13 mm mass in the anterior breast, upper outer quadrant, associated with biopsy clip susceptibility artifact and architectural distortion. 14 mm mass with architectural distortion in the 12 o'clock retroareolar breast. 9 mm mass in the 1 o'clock retroareolar breast. 7 mm mass in the central to anterior breast between 12 and 1 o'clock. 9 mm mass also in the central to anterior breast near 1 o'clock. 7 mm mass in the upper inner quadrant, middle depth, at approximately 11 o'clock. Susceptibility artifact lies adjacent to a 3-4 mm enhancing mass in the upper outer left breast, image 64, series 6. The overall span of the abnormal enhancement, small enhancing masses and non mass enhancement, extends from the nipple posteriorly and superiorly for 10 cm, measuring 6.5 cm medial to lateral and 5 cm superior to inferior. No other areas of abnormal left breast enhancement. Lymph nodes: Single mildly thickened left axillary lymph node, cortex 4-5 mm in thickness, containing susceptibility artifact from the post biopsy marker clip. No other enlarged or abnormal appearing lymph nodes. Ancillary findings:  None. IMPRESSION: 1. Multiple enhancing masses and areas of intervening non mass enhancement in the left breast as detailed above, spanning a total of approximately 10 x 5 x 6.5 cm, which includes the 2 areas of recently biopsied carcinoma.  Findings are consistent with multifocal/multicentric left breast carcinoma. 2. Single mildly enlarged left axillary lymph node, which reflects the recently biopsied metastatic lymph node. No other enlarged or abnormal lymph nodes. 3. No evidence of right breast malignancy. RECOMMENDATION: 1. Consider additional biopsy of the 7 mm enhancing mass on image 81, series 6, in the upper inner quadrant of the left breast, to assess extent of disease, if this would alter the current planned management. BI-RADS CATEGORY  6: Known biopsy-proven malignancy. Electronically Signed   By: Lajean Manes M.D.   On: 10/16/2020 12:35     Assessment and plan- Patient is a 60 y.o. female with prior history of right breast cancer s/p lumpectomy now with anatomical stage IIA left breast invasive mammary carcinoma [id number] ER/PR positive and HER-2 negative here to discuss further management  CT chest abdomen and pelvis with contrast and bone scan did not show any evidence of distant metastatic disease.  MRI showed total area of mass plus non-mass enhancement was close to 10 cm.  She has 1 dominant breast mass of about 2.2 cm with 5 other hypoechoic masses ranging from 4 mm to 1.4 cm.  1 abnormal lymph node which was but also biopsy-proven for breast cancer.  MammaPrint came back as high risk and therefore she would need chemotherapy.  At this time I would recommend neoadjuvant chemotherapy with dose dense AC chemotherapy given every 2 weeks for 4 cycles followed by 12 weekly cycles of Taxol.  Discussed risks and benefits of chemotherapy including all but not limited to nausea, vomiting, low blood counts, risk of infections and hospitalizations.  Risk of cardiotoxicity associated with anthracyclines.  Risk of peripheral neuropathy and infusion reaction associated with Taxol.  Treatment will be given with a curative intent.  Patient understands and agrees to proceed as planned.  I will plan to do interim left breast ultrasound after  4 cycles of dose dense AC chemotherapy.  We will get in touch with Dr. Christian Mate regarding port placement.  Echocardiogram is scheduled for  11/10/2020.  She will also need chemo teach.  I will tentatively plan to see her in 1 week's time to start first cycle of dose dense AC chemotherapy  Cancer Staging Breast cancer, left [facility]) Staging form: Breast, AJCC 8th Edition - Clinical stage from 11/04/2020: Stage IIA (cT2, cN1, cM0, G2, ER+, PR+, HER2-) - Signed by Sindy Guadeloupe, MD on 11/04/2020    Visit Diagnosis 1. Malignant neoplasm of upper-outer quadrant of right breast in female, estrogen receptor positive ([place])   2. Encounter for antineoplastic chemotherapy   3. Goals of care, counseling/discussion      Dr. [name], MD, MPH [facility] [phone] 11/04/2020 12:03 PM

## 2020-11-04 NOTE — Progress Notes (Signed)
START ON PATHWAY REGIMEN - Breast     Cycles 1 through 4: A cycle is every 14 days:     Doxorubicin      Cyclophosphamide      [id number]    Cycles 5 through 16: A cycle is every 7 days:     Paclitaxel   **Always confirm dose/schedule in your pharmacy ordering system**  Patient Characteristics: Preoperative or Nonsurgical Candidate (Clinical Staging), Neoadjuvant Therapy followed by Surgery, Invasive Disease, Chemotherapy, HER2 Negative/Unknown/Equivocal, ER Positive Therapeutic Status: Preoperative or Nonsurgical Candidate (Clinical Staging) AJCC M Category: cM0 AJCC Grade: G2 Breast Surgical Plan: Neoadjuvant Therapy followed by Surgery ER Status: Positive (+) AJCC 8 Stage Grouping: IIA HER2 Status: Negative (-) AJCC T Category: cT2 AJCC N Category: cN1 PR Status: Positive (+) Intent of Therapy: Curative Intent, Discussed with Patient 

## 2020-11-06 ENCOUNTER — Encounter: Payer: Self-pay | Admitting: Surgery

## 2020-11-06 ENCOUNTER — Other Ambulatory Visit: Payer: Self-pay

## 2020-11-06 ENCOUNTER — Ambulatory Visit: Payer: Self-pay | Admitting: Surgery

## 2020-11-06 ENCOUNTER — Inpatient Hospital Stay: Payer: 59 | Admitting: Oncology

## 2020-11-06 ENCOUNTER — Inpatient Hospital Stay: Payer: 59

## 2020-11-06 ENCOUNTER — Ambulatory Visit (INDEPENDENT_AMBULATORY_CARE_PROVIDER_SITE_OTHER): Payer: 59 | Admitting: Surgery

## 2020-11-06 VITALS — BP 164/91 | HR 119 | Temp 99.6°F | Ht 66.0 in | Wt 190.6 lb

## 2020-11-06 DIAGNOSIS — C50412 Malignant neoplasm of upper-outer quadrant of left female breast: Secondary | ICD-10-CM | POA: Diagnosis not present

## 2020-11-06 DIAGNOSIS — Z170 Estrogen receptor positive status [ER+]: Secondary | ICD-10-CM | POA: Diagnosis not present

## 2020-11-06 NOTE — H&P (View-Only) (Signed)
Here to discuss/set up placement for Port-A-Cath to initiate chemotherapy.  Previously her history consists of:  Chief Complaint: Left breast cancer  History of Present Illness Michelle Walker is a 60 y.o. female with a history of T1N0 HER-2 negative, ER/PR positive, who underwent her breast conservation therapy in 2016.  I understand she tolerated tamoxifen for about 6 months, tried an aromatase inhibitor but also could not tolerate it.   Recent screening imaging revealed suspicious lesions in the left breast, along with thickened cortex of an axillary node.  Ultrasound biopsies of 2 separate areas of the upper outer left breast were completed along with the axillary lymph node.  All came back positive for invasive mammary carcinoma, ER/PR positive and HER-2 2+ equivocal.   She is deemed she will likely benefit from chemotherapy, based on additional testing since.    Past Medical History     Past Medical History:  Diagnosis Date  . Anemia   . Arthritis   . 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 Wide excision/ RT, T1a,N0. ER/PR pos, Her 2.neg. Tamoxifen x 6 months, d/c secondary to vasomotor sympotms.   . Headache   . Hypertension   . Personal history of radiation therapy 2016   RIGHT lumpectomy  . Thyroid disease            Past Surgical History:  Procedure Laterality Date  . ANTERIOR CRUCIATE LIGAMENT REPAIR Right   . BREAST BIOPSY Right 07/17/2015   INVASIVE LOBULAR CARCINOMA, CLASSIC TYPE (1.5 MM), ARISING IN A   . BREAST BIOPSY Left 09/19/2020   [place] bx 3 areas/ 1oc q clip/ 12 oc vision clip, axilla lymph node hydromark shape 3/ path pending  . BREAST LUMPECTOMY Right 08/22/2015  . BREAST LUMPECTOMY WITH SENTINEL LYMPH NODE BIOPSY Right 08/22/2015   Procedure: BREAST LUMPECTOMY WITH SENTINEL LYMPH NODE BX;  Surgeon: Christene Lye, MD;  Location: [facility];  Service: General;  Laterality: Right;  . COLONOSCOPY  W/ POLYPECTOMY  2014  . COLONOSCOPY WITH PROPOFOL N/A 04/16/2019   Procedure: COLONOSCOPY WITH BIOPSIES;  Surgeon: Lucilla Lame, MD;  Location: [place];  Service: Endoscopy;  Laterality: N/A;  . DILATION AND CURETTAGE OF UTERUS     20 years ago  . NOVASURE ABLATION    . POLYPECTOMY N/A 04/16/2019   Procedure: POLYPECTOMY;  Surgeon: Lucilla Lame, MD;  Location: [place];  Service: Endoscopy;  Laterality: N/A;         Allergies  Allergen Reactions  . Triamcinolone Other (See Comments)    Hypopigmentation s/p steroid injection  . Tape Rash    SURGICAL TAPE AFTER BREAST BIOPSY          Current Outpatient Medications  Medication Sig Dispense Refill  . allopurinol (ZYLOPRIM) 100 MG tablet Take 100 mg by mouth at bedtime.    [name] amLODipine (NORVASC) 10 MG tablet Take 10 mg by mouth at bedtime.   0  . calcium-vitamin D (OSCAL WITH D) 500-200 MG-UNIT tablet Take 1 tablet by mouth daily with breakfast.    . D3-50 1.25 MG (50000 UT) capsule Take 50,000 Units by mouth once a week.    . losartan (COZAAR) 100 MG tablet Take 100 mg by mouth at bedtime.   0  . metoprolol succinate (TOPROL-XL) 25 MG 24 hr tablet Take 25 mg by mouth at bedtime.   0  . PROVENTIL HFA 108 (90 Base) MCG/ACT inhaler Inhale 2 puffs into the lungs  every 6 (six) hours as needed.      No current facility-administered medications for this visit.    Family History      Family History  Problem Relation Age of Onset  . Stroke Mother   . Cancer Sister        sarcoma on arm; chemo  . Diabetes Sister   . Stroke Sister   . Diabetes Brother   . Breast cancer Neg Hx   . Ovarian cancer Neg Hx   . Colon cancer Neg Hx   . Heart disease Neg Hx       Social History Social History        Tobacco Use  . Smoking status: Current Every Day Smoker    Packs/day: 0.50    Years: 40.00    Pack years: 20.00    Types: Cigarettes  . Smokeless tobacco: Never  Used  Vaping Use  . Vaping Use: Never used  Substance Use Topics  . Alcohol use: Yes    Alcohol/week: 2.0 standard drinks    Types: 2 Standard drinks or equivalent per week    Comment: BEER 1-2 QD  . Drug use: No        Review of Systems  Constitutional: Negative.   HENT: Negative.   Eyes: Negative.   Respiratory: Negative.   Cardiovascular: Negative.   Gastrointestinal: Negative.   Genitourinary: Negative.   Skin: Negative.   Neurological: Negative.   Psychiatric/Behavioral: Negative.       Physical Exam Blood pressure 128/83, pulse 82, temperature 97.9 F (36.6 C), temperature source Oral, height '5\' 6"'  (1.676 m), weight 193 lb 3.2 oz (87.6 kg), SpO2 94 %. Last Weight  Most recent update: 10/07/2020 10:02 AM           Weight  87.6 kg (193 lb 3.2 oz)              CONSTITUTIONAL: Well developed, and nourished, appropriately responsive and aware without distress.  EYES: Sclera non-icteric.   EARS, NOSE, MOUTH AND THROAT: Mask worn.  Hearing is intact to voice.  NECK: Trachea is midline, and there is no jugular venous distension.  LYMPH NODES:  Lymph nodes in the neck are not enlarged. RESPIRATORY:  Lungs are clear, and breath sounds are equal bilaterally. Normal respiratory effort without pathologic use of accessory muscles. CARDIOVASCULAR: Heart is regular in rate and rhythm. GI: The abdomen is soft, nontender, and nondistended. There were no palpable masses. I did not appreciate hepatosplenomegaly.  GU: Left breast exam vague mass upper left breast.  Right breast is consistent with post radiation, lumpectomy and sentinel lymph node changes with the scar is appropriately healed.  Major density is surrounding the prior lumpectomy site in the upper outer quadrant, this is consistent with post radiation.  There are no suspicious nodules to the skin, subcutaneous tissues or breast tissues. MUSCULOSKELETAL:  Symmetrical muscle tone appreciated in all four  extremities.    SKIN: Skin turgor is normal. No pathologic skin lesions appreciated.  NEUROLOGIC:  Motor and sensation appear grossly normal.  Cranial nerves are grossly without defect. PSYCH:  Alert and oriented to person, place and time. Affect is appropriate for situation.   Data Reviewed I have personally reviewed what is currently available of the patient's imaging, recent labs and medical records.   Labs:  CBC Latest Ref Rng & Units 10/08/2015 09/23/2015 08/05/2015  WBC 3.6 - 11.0 K/uL 4.6 5.1 6.7  Hemoglobin 12.0 - 16.0 g/dL 13.7 14.0 12.9  Hematocrit 35 -  47 % 42.2 42.6 39.4  Platelets 150 - 440 K/uL 176 198 172   CMP Latest Ref Rng & Units 12/07/2017 08/05/2015 10/18/2013  Glucose 65 - 99 mg/dL 90 100(H) -  BUN 6 - 20 mg/dL 16 14 -  Creatinine 0.44 - 1.00 mg/dL 0.77 0.77 -  Sodium 135 - 145 mmol/L 139 137 -  Potassium 3.5 - 5.1 mmol/L 3.8 3.8 3.6  Chloride 101 - 111 mmol/L 103 111 -  CO2 22 - 32 mmol/L 24 23 -  Calcium 8.9 - 10.3 mg/dL 9.8 9.0 -  Total Protein 6.5 - 8.1 g/dL 7.5 6.8 -  Total Bilirubin 0.3 - 1.2 mg/dL 0.4 0.5 -  Alkaline Phos 38 - 126 U/L 87 61 -  AST 15 - 41 U/L 18 23 -  ALT 14 - 54 U/L 9(L) 12(L) -     Imaging: Radiology review:   CLINICAL DATA: Evaluate placement of Q biopsy clip (1 o'clock position mass), VISION biopsy clip (12 o'clock position mass) and HydroMARK biopsy clip (LEFT axillary lymph node) following ultrasound-guided LEFT breast and LEFT axillary biopsies.  EXAM: 3D DIAGNOSTIC LEFT MAMMOGRAM POST ULTRASOUND BIOPSY  COMPARISON: Previous exam(s).  FINDINGS: 3D Mammographic images were obtained following ultrasound guided biopsies of the 0.4 cm mass at the 1 o'clock position 5 cm from the nipple (Q clip), the 2.2 cm mass at the 12 o'clock position (VISION clip) and a LEFT axillary lymph node (HydroMARK clip).  The Q biopsy marking clip is in expected position at the site of biopsy of the 0.4 cm mass at the 1 o'clock  position 5 cm from the nipple.  The VISION biopsy marking clip is in expected position at the site of biopsy of the 2.2 cm mass at the 12 o'clock position.  The Q and VISION clips are separated by a distance of 4.5 cm.  The [facility] clip is in expected position at the site of LEFT axillary lymph node biopsy.  IMPRESSION: Appropriate positioning of the Q shaped biopsy marking clip at the site of biopsy in the UPPER OUTER LEFT breast.  Appropriate positioning of the VISION shaped biopsy marking clip at the site of biopsy in the UPPER LEFT breast.  The Q and VISION clips are separated by a distance of 4.5 cm.  Appropriate positioning of the HydroMARK clip in the LEFT axilla.  Final Assessment: Post Procedure Mammograms for Marker Placement   Electronically Signed By: Margarette Canada M.D. On: 09/19/2020 10:44  CLINICAL DATA: Patient with history of right breast lumpectomy 2016.  EXAM: DIGITAL DIAGNOSTIC BILATERAL MAMMOGRAM WITH CAD AND TOMO  ULTRASOUND LEFT BREAST  COMPARISON: Previous exam(s).  ACR Breast Density Category c: The breast tissue is heterogeneously dense, which may obscure small masses.  FINDINGS: Within the upper-outer left breast extending from behind the nipple towards the outer left breast there are 3 focal areas of distortion demonstrated both on the cc and true lateral views. No additional new suspicious findings within the right or left breast. Stable postlumpectomy changes right breast.  Mammographic images were processed with CAD.  Targeted ultrasound is performed, showing a 2.2 x 1.9 x 1.9 cm irregular hypoechoic mass left breast 12 o'clock position 1 cm from the nipple.  There is a 0.8 x 0.5 x 0.7 cm irregular hypoechoic mass left breast 1 o'clock position 3 cm from nipple.  There is a 0.3 x 0.4 x 0.4 cm irregular hypoechoic mass left breast 1 o'clock position 5 cm from nipple.  There is a 0.5  x 0.3 x 0.4 cm  lobular round mass left breast 12 o'clock position 5 cm from nipple.  There is a 0.5 x 0.3 x 0.4 cm lobular hypoechoic mass left breast 11 o'clock position 5 cm from nipple.  There is a 0.3 x 0.6 x 0.6 cm irregular hypoechoic mass left breast 12 o'clock position 6 cm from nipple.  Cortically thickened left axillary lymph node measuring 4 mm.  IMPRESSION: 1. Interval development of at least 3 focal areas of architectural distortion within the retroareolar and upper outer left breast. There are multiple new suspicious masses identified within the left breast from 11 o'clock-1 o'clock position as detailed above. 2. Stable postlumpectomy changes right breast.  RECOMMENDATION: 1. Ultrasound-guided core needle biopsy left breast mass 12 o'clock position 1 cm from the nipple. 2. Ultrasound-guided core needle biopsy left breast mass 1 o'clock position 5 cm from nipple. 3. Ultrasound-guided core needle biopsy left axillary lymph node. 4. Patient will need bilateral breast MRI for extent of disease. 5. If breast conservation is being considered, additional biopsies of the above described masses will need to be obtained for extent of disease confirmation.  I have discussed the findings and recommendations with the patient. If applicable, a reminder letter will be sent to the patient regarding the next appointment.  BI-RADS CATEGORY 5: Highly suggestive of malignancy.   Electronically Signed By: Lovey Newcomer M.D. On: 09/15/2020 15:42  Assessment  Assessment    Second breast cancer, advanced with lymph node involvement now left breast, with prior history of right breast cancer.     Patient Active Problem List   Diagnosis Date Noted  . History of colonic polyps   . Polyp of ascending colon   . Depression 09/20/2017  . DM2 (diabetes mellitus, type 2) ([place]) 09/20/2017  . Gout 09/20/2017  . HTN (hypertension) 09/20/2017  . Migraine 09/20/2017  . History of  endometrial ablation 02/26/2016  . Breast cancer of upper-outer quadrant of right female breast ([place]) 08/04/2015    Plan Proceed with Port-A-Cath placement, risks discussed in detail.   Previous discussion re-noted below:    We discussed the role of MRI as far as utilizing it for monitoring response to chemotherapy.  I do not believe with the multiple lesions noted, that breast conservation would ever be considered should MRI she was an amazing response.  We are anticipating chemotherapy upfront due to her positive lymph node status.  We discussed the option of surgery first, and explained the strategies of chemo upfront. At the present she would likely need a left modified radical mastectomy with axillary dissection.  We did not discuss immediate reconstruction at this time, primarily because we are anticipating chemotherapy first. We did discuss placement of a Port-A-Cath if this is to be our approach from a multidisciplinary standpoint. I suspect ideally the Port-A-Cath should be done after the MRI is completed. Risks of port placement discussed with patient, and she seems to have prior experience with a relative.

## 2020-11-06 NOTE — Patient Instructions (Addendum)
Our surgery scheduler Pamala Hurry will be calling you within 24-48 hours with your surgery appointment. Please have the blue sheet available when she calls to write down important information. If she has not called by Monday 01/10, please call our office. You will need to get covid tested.    [employer], Adult A central line is a thin, flexible tube (catheter) that is put in your vein. It can be used to:  Give you medicine.  Give you food and nutrients. Follow these instructions at home: Caring for the tube   Follow instructions from your doctor about: ? Flushing the tube with saline solution. ? Cleaning the tube and the area around it.  Only flush with clean (sterile) supplies. The supplies should be from your doctor, a pharmacy, or another place that your doctor recommends.  Before you flush the tube or clean the area around the tube: ? Wash your hands with soap and water. If you cannot use soap and water, use hand sanitizer. ? Clean the central line hub with rubbing alcohol. Caring for your skin  Keep the area where the tube was put in clean and dry.  Every day, and when changing the bandage, check the skin around the central line for: ? Redness, swelling, or pain. ? Fluid or blood. ? Warmth. ? Pus. ? A bad smell. General instructions  Keep the tube clamped, unless it is being used.  Keep your supplies in a clean, dry location.  If you or someone else accidentally pulls on the tube, make sure: ? The bandage (dressing) is okay. ? There is no bleeding. ? The tube has not been pulled out.  Do not use scissors or sharp objects near the tube.  Do not swim or let the tube soak in a tub.  Ask your doctor what activities are safe for you. Your doctor may tell you not to lift anything or move your arm too much.  Take over-the-counter and prescription medicines only as told by your doctor.  Change bandages as told by your doctor.  Keep your bandage dry. If a bandage gets  wet, have it changed right away.  Keep all follow-up visits as told by your doctor. This is important. Throwing away supplies  Throw away any syringes in a trash (disposal) container that is only for sharp items (sharps container). You can buy a sharps container from a pharmacy, or you can make one by using an empty hard plastic bottle with a cover.  Place any used bandages or infusion bags into a plastic bag. Throw that bag in the trash. Contact a doctor if:  You have any of these where the tube was put in: ? Redness, swelling, or pain. ? Fluid or blood. ? A warm feeling. ? Pus or a bad smell. Get help right away if:  You have: ? A fever. ? Chills. ? Trouble getting enough air (shortness of breath). ? Trouble breathing. ? Pain in your chest. ? Swelling in your neck, face, chest, or arm.  You are coughing.  You feel your heart beating fast or skipping beats.  You feel dizzy or you pass out (faint).  There are red lines coming from where the tube was put in.  The area where the tube was put in is bleeding and the bleeding will not stop.  Your tube is hard to flush.  You do not get a blood return from the tube.  The tube gets loose or comes out.  The tube has a  hole or a tear.  The tube leaks. Summary  A central line is a thin, flexible tube (catheter) that is put in your vein. It can be used to take blood for lab tests or to give you medicine.  Follow instructions from your doctor about flushing and cleaning the tube.  Keep the area where the tube was put in clean and dry.  Ask your doctor what activities are safe for you. This information is not intended to replace advice given to you by your health care provider. Make sure you discuss any questions you have with your health care provider. Document Revised: 02/07/2019 Document Reviewed: 11/04/2016 Elsevier Patient Education  2020 [employer].

## 2020-11-06 NOTE — Progress Notes (Signed)
Here to discuss/set up placement for Port-A-Cath to initiate chemotherapy.  Previously her history consists of:  Chief Complaint: Left breast cancer  History of Present Illness Michelle Walker is a 60 y.o. female with a history of T1N0 HER-2 negative, ER/PR positive, who underwent her breast conservation therapy in 2016.  I understand she tolerated tamoxifen for about 6 months, tried an aromatase inhibitor but also could not tolerate it.   Recent screening imaging revealed suspicious lesions in the left breast, along with thickened cortex of an axillary node.  Ultrasound biopsies of 2 separate areas of the upper outer left breast were completed along with the axillary lymph node.  All came back positive for invasive mammary carcinoma, ER/PR positive and HER-2 2+ equivocal.   She is deemed she will likely benefit from chemotherapy, based on additional testing since.    Past Medical History     Past Medical History:  Diagnosis Date  . Anemia   . Arthritis   . 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 Wide excision/ RT, T1a,N0. ER/PR pos, Her 2.neg. Tamoxifen x 6 months, d/c secondary to vasomotor sympotms.   . Headache   . Hypertension   . Personal history of radiation therapy 2016   RIGHT lumpectomy  . Thyroid disease            Past Surgical History:  Procedure Laterality Date  . ANTERIOR CRUCIATE LIGAMENT REPAIR Right   . BREAST BIOPSY Right 07/17/2015   INVASIVE LOBULAR CARCINOMA, CLASSIC TYPE (1.5 MM), ARISING IN A   . BREAST BIOPSY Left 09/19/2020   [place] bx 3 areas/ 1oc q clip/ 12 oc vision clip, axilla lymph node hydromark shape 3/ path pending  . BREAST LUMPECTOMY Right 08/22/2015  . BREAST LUMPECTOMY WITH SENTINEL LYMPH NODE BIOPSY Right 08/22/2015   Procedure: BREAST LUMPECTOMY WITH SENTINEL LYMPH NODE BX;  Surgeon: Christene Lye, MD;  Location: [facility];  Service: General;  Laterality: Right;  . COLONOSCOPY  W/ POLYPECTOMY  2014  . COLONOSCOPY WITH PROPOFOL N/A 04/16/2019   Procedure: COLONOSCOPY WITH BIOPSIES;  Surgeon: Lucilla Lame, MD;  Location: [place];  Service: Endoscopy;  Laterality: N/A;  . DILATION AND CURETTAGE OF UTERUS     20 years ago  . NOVASURE ABLATION    . POLYPECTOMY N/A 04/16/2019   Procedure: POLYPECTOMY;  Surgeon: Lucilla Lame, MD;  Location: [place];  Service: Endoscopy;  Laterality: N/A;         Allergies  Allergen Reactions  . Triamcinolone Other (See Comments)    Hypopigmentation s/p steroid injection  . Tape Rash    SURGICAL TAPE AFTER BREAST BIOPSY          Current Outpatient Medications  Medication Sig Dispense Refill  . allopurinol (ZYLOPRIM) 100 MG tablet Take 100 mg by mouth at bedtime.    [name] amLODipine (NORVASC) 10 MG tablet Take 10 mg by mouth at bedtime.   0  . calcium-vitamin D (OSCAL WITH D) 500-200 MG-UNIT tablet Take 1 tablet by mouth daily with breakfast.    . D3-50 1.25 MG (50000 UT) capsule Take 50,000 Units by mouth once a week.    . losartan (COZAAR) 100 MG tablet Take 100 mg by mouth at bedtime.   0  . metoprolol succinate (TOPROL-XL) 25 MG 24 hr tablet Take 25 mg by mouth at bedtime.   0  . PROVENTIL HFA 108 (90 Base) MCG/ACT inhaler Inhale 2 puffs into the lungs  every 6 (six) hours as needed.      No current facility-administered medications for this visit.    Family History      Family History  Problem Relation Age of Onset  . Stroke Mother   . Cancer Sister        sarcoma on arm; chemo  . Diabetes Sister   . Stroke Sister   . Diabetes Brother   . Breast cancer Neg Hx   . Ovarian cancer Neg Hx   . Colon cancer Neg Hx   . Heart disease Neg Hx       Social History Social History        Tobacco Use  . Smoking status: Current Every Day Smoker    Packs/day: 0.50    Years: 40.00    Pack years: 20.00    Types: Cigarettes  . Smokeless tobacco: Never  Used  Vaping Use  . Vaping Use: Never used  Substance Use Topics  . Alcohol use: Yes    Alcohol/week: 2.0 standard drinks    Types: 2 Standard drinks or equivalent per week    Comment: BEER 1-2 QD  . Drug use: No        Review of Systems  Constitutional: Negative.   HENT: Negative.   Eyes: Negative.   Respiratory: Negative.   Cardiovascular: Negative.   Gastrointestinal: Negative.   Genitourinary: Negative.   Skin: Negative.   Neurological: Negative.   Psychiatric/Behavioral: Negative.       Physical Exam Blood pressure 128/83, pulse 82, temperature 97.9 F (36.6 C), temperature source Oral, height '5\' 6"'  (1.676 m), weight 193 lb 3.2 oz (87.6 kg), SpO2 94 %. Last Weight  Most recent update: 10/07/2020 10:02 AM           Weight  87.6 kg (193 lb 3.2 oz)              CONSTITUTIONAL: Well developed, and nourished, appropriately responsive and aware without distress.  EYES: Sclera non-icteric.   EARS, NOSE, MOUTH AND THROAT: Mask worn.  Hearing is intact to voice.  NECK: Trachea is midline, and there is no jugular venous distension.  LYMPH NODES:  Lymph nodes in the neck are not enlarged. RESPIRATORY:  Lungs are clear, and breath sounds are equal bilaterally. Normal respiratory effort without pathologic use of accessory muscles. CARDIOVASCULAR: Heart is regular in rate and rhythm. GI: The abdomen is soft, nontender, and nondistended. There were no palpable masses. I did not appreciate hepatosplenomegaly.  GU: Left breast exam vague mass upper left breast.  Right breast is consistent with post radiation, lumpectomy and sentinel lymph node changes with the scar is appropriately healed.  Major density is surrounding the prior lumpectomy site in the upper outer quadrant, this is consistent with post radiation.  There are no suspicious nodules to the skin, subcutaneous tissues or breast tissues. MUSCULOSKELETAL:  Symmetrical muscle tone appreciated in all four  extremities.    SKIN: Skin turgor is normal. No pathologic skin lesions appreciated.  NEUROLOGIC:  Motor and sensation appear grossly normal.  Cranial nerves are grossly without defect. PSYCH:  Alert and oriented to person, place and time. Affect is appropriate for situation.   Data Reviewed I have personally reviewed what is currently available of the patient's imaging, recent labs and medical records.   Labs:  CBC Latest Ref Rng & Units 10/08/2015 09/23/2015 08/05/2015  WBC 3.6 - 11.0 K/uL 4.6 5.1 6.7  Hemoglobin 12.0 - 16.0 g/dL 13.7 14.0 12.9  Hematocrit 35 -  47 % 42.2 42.6 39.4  Platelets 150 - 440 K/uL 176 198 172   CMP Latest Ref Rng & Units 12/07/2017 08/05/2015 10/18/2013  Glucose 65 - 99 mg/dL 90 100(H) -  BUN 6 - 20 mg/dL 16 14 -  Creatinine 0.44 - 1.00 mg/dL 0.77 0.77 -  Sodium 135 - 145 mmol/L 139 137 -  Potassium 3.5 - 5.1 mmol/L 3.8 3.8 3.6  Chloride 101 - 111 mmol/L 103 111 -  CO2 22 - 32 mmol/L 24 23 -  Calcium 8.9 - 10.3 mg/dL 9.8 9.0 -  Total Protein 6.5 - 8.1 g/dL 7.5 6.8 -  Total Bilirubin 0.3 - 1.2 mg/dL 0.4 0.5 -  Alkaline Phos 38 - 126 U/L 87 61 -  AST 15 - 41 U/L 18 23 -  ALT 14 - 54 U/L 9(L) 12(L) -     Imaging: Radiology review:   CLINICAL DATA: Evaluate placement of Q biopsy clip (1 o'clock position mass), VISION biopsy clip (12 o'clock position mass) and HydroMARK biopsy clip (LEFT axillary lymph node) following ultrasound-guided LEFT breast and LEFT axillary biopsies.  EXAM: 3D DIAGNOSTIC LEFT MAMMOGRAM POST ULTRASOUND BIOPSY  COMPARISON: Previous exam(s).  FINDINGS: 3D Mammographic images were obtained following ultrasound guided biopsies of the 0.4 cm mass at the 1 o'clock position 5 cm from the nipple (Q clip), the 2.2 cm mass at the 12 o'clock position (VISION clip) and a LEFT axillary lymph node (HydroMARK clip).  The Q biopsy marking clip is in expected position at the site of biopsy of the 0.4 cm mass at the 1 o'clock  position 5 cm from the nipple.  The VISION biopsy marking clip is in expected position at the site of biopsy of the 2.2 cm mass at the 12 o'clock position.  The Q and VISION clips are separated by a distance of 4.5 cm.  [employer] clip is in expected position at the site of LEFT axillary lymph node biopsy.  IMPRESSION: Appropriate positioning of the Q shaped biopsy marking clip at the site of biopsy in the UPPER OUTER LEFT breast.  Appropriate positioning of the VISION shaped biopsy marking clip at the site of biopsy in the UPPER LEFT breast.  The Q and VISION clips are separated by a distance of 4.5 cm.  Appropriate positioning of the HydroMARK clip in the LEFT axilla.  Final Assessment: Post Procedure Mammograms for Marker Placement   Electronically Signed By: Margarette Canada M.D. On: 09/19/2020 10:44  CLINICAL DATA: Patient with history of right breast lumpectomy 2016.  EXAM: DIGITAL DIAGNOSTIC BILATERAL MAMMOGRAM WITH CAD AND TOMO  ULTRASOUND LEFT BREAST  COMPARISON: Previous exam(s).  ACR Breast Density Category c: The breast tissue is heterogeneously dense, which may obscure small masses.  FINDINGS: Within the upper-outer left breast extending from behind the nipple towards the outer left breast there are 3 focal areas of distortion demonstrated both on the cc and true lateral views. No additional new suspicious findings within the right or left breast. Stable postlumpectomy changes right breast.  Mammographic images were processed with CAD.  Targeted ultrasound is performed, showing a 2.2 x 1.9 x 1.9 cm irregular hypoechoic mass left breast 12 o'clock position 1 cm from the nipple.  There is a 0.8 x 0.5 x 0.7 cm irregular hypoechoic mass left breast 1 o'clock position 3 cm from nipple.  There is a 0.3 x 0.4 x 0.4 cm irregular hypoechoic mass left breast 1 o'clock position 5 cm from nipple.  There is a 0.5  x 0.3 x 0.4 cm  lobular round mass left breast 12 o'clock position 5 cm from nipple.  There is a 0.5 x 0.3 x 0.4 cm lobular hypoechoic mass left breast 11 o'clock position 5 cm from nipple.  There is a 0.3 x 0.6 x 0.6 cm irregular hypoechoic mass left breast 12 o'clock position 6 cm from nipple.  Cortically thickened left axillary lymph node measuring 4 mm.  IMPRESSION: 1. Interval development of at least 3 focal areas of architectural distortion within the retroareolar and upper outer left breast. There are multiple new suspicious masses identified within the left breast from 11 o'clock-1 o'clock position as detailed above. 2. Stable postlumpectomy changes right breast.  RECOMMENDATION: 1. Ultrasound-guided core needle biopsy left breast mass 12 o'clock position 1 cm from the nipple. 2. Ultrasound-guided core needle biopsy left breast mass 1 o'clock position 5 cm from nipple. 3. Ultrasound-guided core needle biopsy left axillary lymph node. 4. Patient will need bilateral breast MRI for extent of disease. 5. If breast conservation is being considered, additional biopsies of the above described masses will need to be obtained for extent of disease confirmation.  I have discussed the findings and recommendations with the patient. If applicable, a reminder letter will be sent to the patient regarding the next appointment.  BI-RADS CATEGORY 5: Highly suggestive of malignancy.   Electronically Signed By: Lovey Newcomer M.D. On: 09/15/2020 15:42  Assessment  Assessment    Second breast cancer, advanced with lymph node involvement now left breast, with prior history of right breast cancer.     Patient Active Problem List   Diagnosis Date Noted  . History of colonic polyps   . Polyp of ascending colon   . Depression 09/20/2017  . DM2 (diabetes mellitus, type 2) ([place]) 09/20/2017  . Gout 09/20/2017  . HTN (hypertension) 09/20/2017  . Migraine 09/20/2017  . History of  endometrial ablation 02/26/2016  . Breast cancer of upper-outer quadrant of right female breast ([place]) 08/04/2015    Plan Proceed with Port-A-Cath placement, risks discussed in detail.   Previous discussion re-noted below:    We discussed the role of MRI as far as utilizing it for monitoring response to chemotherapy.  I do not believe with the multiple lesions noted, that breast conservation would ever be considered should MRI she was an amazing response.  We are anticipating chemotherapy upfront due to her positive lymph node status.  We discussed the option of surgery first, and explained the strategies of chemo upfront. At the present she would likely need a left modified radical mastectomy with axillary dissection.  We did not discuss immediate reconstruction at this time, primarily because we are anticipating chemotherapy first. We did discuss placement of a Port-A-Cath if this is to be our approach from a multidisciplinary standpoint. I suspect ideally the Port-A-Cath should be done after the MRI is completed. Risks of port placement discussed with patient, and she seems to have prior experience with a relative.

## 2020-11-07 ENCOUNTER — Telehealth: Payer: Self-pay | Admitting: Surgery

## 2020-11-07 NOTE — Patient Instructions (Incomplete)
Doxorubicin injection What is this medicine? DOXORUBICIN (dox oh ROO bi sin) is a chemotherapy drug. It is used to treat many kinds of cancer like leukemia, lymphoma, neuroblastoma, sarcoma, and Wilms' tumor. It is also used to treat bladder cancer, breast cancer, lung cancer, ovarian cancer, stomach cancer, and thyroid cancer. This medicine may be used for other purposes; ask your health care provider or pharmacist if you have questions. COMMON BRAND NAME(S): Adriamycin, Adriamycin PFS, Adriamycin RDF, Rubex What should I tell my health care provider before I take this medicine? They need to know if you have any of these conditions:  heart disease  history of low blood counts caused by a medicine  liver disease  recent or ongoing radiation therapy  an unusual or allergic reaction to doxorubicin, other chemotherapy agents, other medicines, foods, dyes, or preservatives  pregnant or trying to get pregnant  breast-feeding How should I use this medicine? This drug is given as an infusion into a vein. It is administered in a hospital or clinic by a specially trained health care professional. If you have pain, swelling, burning or any unusual feeling around the site of your injection, tell your health care professional right away. Talk to your pediatrician regarding the use of this medicine in children. Special care may be needed. Overdosage: If you think you have taken too much of this medicine contact a poison control center or emergency room at once. NOTE: This medicine is only for you. Do not share this medicine with others. What if I miss a dose? It is important not to miss your dose. Call your doctor or health care professional if you are unable to keep an appointment. What may interact with this medicine? This medicine may interact with the following medications:  6-mercaptopurine  paclitaxel  phenytoin  St. John's Wort  trastuzumab  verapamil This list may not describe  all possible interactions. Give your health care provider a list of all the medicines, herbs, non-prescription drugs, or dietary supplements you use. Also tell them if you smoke, drink alcohol, or use illegal drugs. Some items may interact with your medicine. What should I watch for while using this medicine? This drug may make you feel generally unwell. This is not uncommon, as chemotherapy can affect healthy cells as well as cancer cells. Report any side effects. Continue your course of treatment even though you feel ill unless your doctor tells you to stop. There is a maximum amount of this medicine you should receive throughout your life. The amount depends on the medical condition being treated and your overall health. Your doctor will watch how much of this medicine you receive in your lifetime. Tell your doctor if you have taken this medicine before. You may need blood work done while you are taking this medicine. Your urine may turn red for a few days after your dose. This is not blood. If your urine is dark or brown, call your doctor. In some cases, you may be given additional medicines to help with side effects. Follow all directions for their use. Call your doctor or health care professional for advice if you get a fever, chills or sore throat, or other symptoms of a cold or flu. Do not treat yourself. This drug decreases your body's ability to fight infections. Try to avoid being around people who are sick. This medicine may increase your risk to bruise or bleed. Call your doctor or health care professional if you notice any unusual bleeding. Talk to your doctor   about your risk of cancer. You may be more at risk for certain types of cancers if you take this medicine. Do not become pregnant while taking this medicine or for 6 months after stopping it. Women should inform their doctor if they wish to become pregnant or think they might be pregnant. Men should not father a child while taking this  medicine and for 6 months after stopping it. There is a potential for serious side effects to an unborn child. Talk to your health care professional or pharmacist for more information. Do not breast-feed an infant while taking this medicine. This medicine has caused ovarian failure in some women and reduced sperm counts in some men This medicine may interfere with the ability to have a child. Talk with your doctor or health care professional if you are concerned about your fertility. This medicine may cause a decrease in Co-Enzyme Q-10. You should make sure that you get enough Co-Enzyme Q-10 while you are taking this medicine. Discuss the foods you eat and the vitamins you take with your health care professional. What side effects may I notice from receiving this medicine? Side effects that you should report to your doctor or health care professional as soon as possible:  allergic reactions like skin rash, itching or hives, swelling of the face, lips, or tongue  breathing problems  chest pain  fast or irregular heartbeat  low blood counts - this medicine may decrease the number of white blood cells, red blood cells and platelets. You may be at increased risk for infections and bleeding.  pain, redness, or irritation at site where injected  signs of infection - fever or chills, cough, sore throat, pain or difficulty passing urine  signs of decreased platelets or bleeding - bruising, pinpoint red spots on the skin, black, tarry stools, blood in the urine  swelling of the ankles, feet, hands  tiredness  weakness Side effects that usually do not require medical attention (report to your doctor or health care professional if they continue or are bothersome):  diarrhea  hair loss  mouth sores  nail discoloration or damage  nausea  red colored urine  vomiting This list may not describe all possible side effects. Call your doctor for medical advice about side effects. You may report  side effects to FDA at 1-800-FDA-1088. Where should I keep my medicine? This drug is given in a hospital or clinic and will not be stored at home. NOTE: This sheet is a summary. It may not cover all possible information. If you have questions about this medicine, talk to your doctor, pharmacist, or health care provider.  2020 Elsevier/Gold Standard (2017-06-01 11:01:26) Cyclophosphamide Injection What is this medicine? CYCLOPHOSPHAMIDE (sye kloe FOSS fa mide) is a chemotherapy drug. It slows the growth of cancer cells. This medicine is used to treat many types of cancer like lymphoma, myeloma, leukemia, breast cancer, and ovarian cancer, to name a few. This medicine may be used for other purposes; ask your health care provider or pharmacist if you have questions. COMMON BRAND NAME(S): Cytoxan, Neosar What should I tell my health care provider before I take this medicine? They need to know if you have any of these conditions:  heart disease  history of irregular heartbeat  infection  kidney disease  liver disease  low blood counts, like white cells, platelets, or red blood cells  on hemodialysis  recent or ongoing radiation therapy  scarring or thickening of the lungs  trouble passing urine  an   unusual or allergic reaction to cyclophosphamide, other medicines, foods, dyes, or preservatives  pregnant or trying to get pregnant  breast-feeding How should I use this medicine? This drug is usually given as an injection into a vein or muscle or by infusion into a vein. It is administered in a hospital or clinic by a specially trained health care professional. Talk to your pediatrician regarding the use of this medicine in children. Special care may be needed. Overdosage: If you think you have taken too much of this medicine contact a poison control center or emergency room at once. NOTE: This medicine is only for you. Do not share this medicine with others. What if I miss a  dose? It is important not to miss your dose. Call your doctor or health care professional if you are unable to keep an appointment. What may interact with this medicine?  amphotericin B  azathioprine  certain antivirals for HIV or hepatitis  certain medicines for blood pressure, heart disease, irregular heart beat  certain medicines that treat or prevent blood clots like warfarin  certain other medicines for cancer  cyclosporine  etanercept  indomethacin  medicines that relax muscles for surgery  medicines to increase blood counts  metronidazole This list may not describe all possible interactions. Give your health care provider a list of all the medicines, herbs, non-prescription drugs, or dietary supplements you use. Also tell them if you smoke, drink alcohol, or use illegal drugs. Some items may interact with your medicine. What should I watch for while using this medicine? Your condition will be monitored carefully while you are receiving this medicine. You may need blood work done while you are taking this medicine. Drink water or other fluids as directed. Urinate often, even at night. Some products may contain alcohol. Ask your health care professional if this medicine contains alcohol. Be sure to tell all health care professionals you are taking this medicine. Certain medicines, like metronidazole and disulfiram, can cause an unpleasant reaction when taken with alcohol. The reaction includes flushing, headache, nausea, vomiting, sweating, and increased thirst. The reaction can last from 30 minutes to several hours. Do not become pregnant while taking this medicine or for 1 year after stopping it. Women should inform their health care professional if they wish to become pregnant or think they might be pregnant. Men should not father a child while taking this medicine and for 4 months after stopping it. There is potential for serious side effects to an unborn child. Talk to your  health care professional for more information. Do not breast-feed an infant while taking this medicine or for 1 week after stopping it. This medicine has caused ovarian failure in some women. This medicine may make it more difficult to get pregnant. Talk to your health care professional if you are concerned about your fertility. This medicine has caused decreased sperm counts in some men. This may make it more difficult to father a child. Talk to your health care professional if you are concerned about your fertility. Call your health care professional for advice if you get a fever, chills, or sore throat, or other symptoms of a cold or flu. Do not treat yourself. This medicine decreases your body's ability to fight infections. Try to avoid being around people who are sick. Avoid taking medicines that contain aspirin, acetaminophen, ibuprofen, naproxen, or ketoprofen unless instructed by your health care professional. These medicines may hide a fever. Talk to your health care professional about your risk of cancer.   You may be more at risk for certain types of cancer if you take this medicine. If you are going to need surgery or other procedure, tell your health care professional that you are using this medicine. Be careful brushing or flossing your teeth or using a toothpick because you may get an infection or bleed more easily. If you have any dental work done, tell your dentist you are receiving this medicine. What side effects may I notice from receiving this medicine? Side effects that you should report to your doctor or health care professional as soon as possible:  allergic reactions like skin rash, itching or hives, swelling of the face, lips, or tongue  breathing problems  nausea, vomiting  signs and symptoms of bleeding such as bloody or black, tarry stools; red or dark brown urine; spitting up blood or brown material that looks like coffee grounds; red spots on the skin; unusual bruising  or bleeding from the eyes, gums, or nose  signs and symptoms of heart failure like fast, irregular heartbeat, sudden weight gain; swelling of the ankles, feet, hands  signs and symptoms of infection like fever; chills; cough; sore throat; pain or trouble passing urine  signs and symptoms of kidney injury like trouble passing urine or change in the amount of urine  signs and symptoms of liver injury like dark yellow or brown urine; general ill feeling or flu-like symptoms; light-colored stools; loss of appetite; nausea; right upper belly pain; unusually weak or tired; yellowing of the eyes or skin Side effects that usually do not require medical attention (report to your doctor or health care professional if they continue or are bothersome):  confusion  decreased hearing  diarrhea  facial flushing  hair loss  headache  loss of appetite  missed menstrual periods  signs and symptoms of low red blood cells or anemia such as unusually weak or tired; feeling faint or lightheaded; falls  skin discoloration This list may not describe all possible side effects. Call your doctor for medical advice about side effects. You may report side effects to FDA at 1-800-FDA-1088. Where should I keep my medicine? This drug is given in a hospital or clinic and will not be stored at home. NOTE: This sheet is a summary. It may not cover all possible information. If you have questions about this medicine, talk to your doctor, pharmacist, or health care provider.  2020 Elsevier/Gold Standard (2019-07-23 09:53:29) Paclitaxel injection What is this medicine? PACLITAXEL (PAK li TAX el) is a chemotherapy drug. It targets fast dividing cells, like cancer cells, and causes these cells to die. This medicine is used to treat ovarian cancer, breast cancer, lung cancer, Kaposi's sarcoma, and other cancers. This medicine may be used for other purposes; ask your health care provider or pharmacist if you have  questions. COMMON BRAND NAME(S): Onxol, Taxol What should I tell my health care provider before I take this medicine? They need to know if you have any of these conditions:  history of irregular heartbeat  liver disease  low blood counts, like low white cell, platelet, or red cell counts  lung or breathing disease, like asthma  tingling of the fingers or toes, or other nerve disorder  an unusual or allergic reaction to paclitaxel, alcohol, polyoxyethylated castor oil, other chemotherapy, other medicines, foods, dyes, or preservatives  pregnant or trying to get pregnant  breast-feeding How should I use this medicine? This drug is given as an infusion into a vein. It is administered in a   hospital or clinic by a specially trained health care professional. Talk to your pediatrician regarding the use of this medicine in children. Special care may be needed. Overdosage: If you think you have taken too much of this medicine contact a poison control center or emergency room at once. NOTE: This medicine is only for you. Do not share this medicine with others. What if I miss a dose? It is important not to miss your dose. Call your doctor or health care professional if you are unable to keep an appointment. What may interact with this medicine? Do not take this medicine with any of the following medications:  disulfiram  metronidazole This medicine may also interact with the following medications:  antiviral medicines for hepatitis, HIV or AIDS  certain antibiotics like erythromycin and clarithromycin  certain medicines for fungal infections like ketoconazole and itraconazole  certain medicines for seizures like carbamazepine, phenobarbital, phenytoin  gemfibrozil  nefazodone  rifampin  St. John's wort This list may not describe all possible interactions. Give your health care provider a list of all the medicines, herbs, non-prescription drugs, or dietary supplements you use.  Also tell them if you smoke, drink alcohol, or use illegal drugs. Some items may interact with your medicine. What should I watch for while using this medicine? Your condition will be monitored carefully while you are receiving this medicine. You will need important blood work done while you are taking this medicine. This medicine can cause serious allergic reactions. To reduce your risk you will need to take other medicine(s) before treatment with this medicine. If you experience allergic reactions like skin rash, itching or hives, swelling of the face, lips, or tongue, tell your doctor or health care professional right away. In some cases, you may be given additional medicines to help with side effects. Follow all directions for their use. This drug may make you feel generally unwell. This is not uncommon, as chemotherapy can affect healthy cells as well as cancer cells. Report any side effects. Continue your course of treatment even though you feel ill unless your doctor tells you to stop. Call your doctor or health care professional for advice if you get a fever, chills or sore throat, or other symptoms of a cold or flu. Do not treat yourself. This drug decreases your body's ability to fight infections. Try to avoid being around people who are sick. This medicine may increase your risk to bruise or bleed. Call your doctor or health care professional if you notice any unusual bleeding. Be careful brushing and flossing your teeth or using a toothpick because you may get an infection or bleed more easily. If you have any dental work done, tell your dentist you are receiving this medicine. Avoid taking products that contain aspirin, acetaminophen, ibuprofen, naproxen, or ketoprofen unless instructed by your doctor. These medicines may hide a fever. Do not become pregnant while taking this medicine. Women should inform their doctor if they wish to become pregnant or think they might be pregnant. There is a  potential for serious side effects to an unborn child. Talk to your health care professional or pharmacist for more information. Do not breast-feed an infant while taking this medicine. Men are advised not to father a child while receiving this medicine. This product may contain alcohol. Ask your pharmacist or healthcare provider if this medicine contains alcohol. Be sure to tell all healthcare providers you are taking this medicine. Certain medicines, like metronidazole and disulfiram, can cause an unpleasant reaction when   taken with alcohol. The reaction includes flushing, headache, nausea, vomiting, sweating, and increased thirst. The reaction can last from 30 minutes to several hours. What side effects may I notice from receiving this medicine? Side effects that you should report to your doctor or health care professional as soon as possible:  allergic reactions like skin rash, itching or hives, swelling of the face, lips, or tongue  breathing problems  changes in vision  fast, irregular heartbeat  high or low blood pressure  mouth sores  pain, tingling, numbness in the hands or feet  signs of decreased platelets or bleeding - bruising, pinpoint red spots on the skin, black, tarry stools, blood in the urine  signs of decreased red blood cells - unusually weak or tired, feeling faint or lightheaded, falls  signs of infection - fever or chills, cough, sore throat, pain or difficulty passing urine  signs and symptoms of liver injury like dark yellow or brown urine; general ill feeling or flu-like symptoms; light-colored stools; loss of appetite; nausea; right upper belly pain; unusually weak or tired; yellowing of the eyes or skin  swelling of the ankles, feet, hands  unusually slow heartbeat Side effects that usually do not require medical attention (report to your doctor or health care professional if they continue or are bothersome):  diarrhea  hair loss  loss of  appetite  muscle or joint pain  nausea, vomiting  pain, redness, or irritation at site where injected  tiredness This list may not describe all possible side effects. Call your doctor for medical advice about side effects. You may report side effects to FDA at 1-800-FDA-1088. Where should I keep my medicine? This drug is given in a hospital or clinic and will not be stored at home. NOTE: This sheet is a summary. It may not cover all possible information. If you have questions about this medicine, talk to your doctor, pharmacist, or health care provider.  2020 Elsevier/Gold Standard (2017-06-21 13:14:55) Pegfilgrastim injection What is this medicine? PEGFILGRASTIM (PEG fil gra stim) is a long-acting granulocyte colony-stimulating factor that stimulates the growth of neutrophils, a type of white blood cell important in the body's fight against infection. It is used to reduce the incidence of fever and infection in patients with certain types of cancer who are receiving chemotherapy that affects the bone marrow, and to increase survival after being exposed to high doses of radiation. This medicine may be used for other purposes; ask your health care provider or pharmacist if you have questions. COMMON BRAND NAME(S): Fulphila, Neulasta, UDENYCA, Ziextenzo What should I tell my health care provider before I take this medicine? They need to know if you have any of these conditions:  kidney disease  latex allergy  ongoing radiation therapy  sickle cell disease  skin reactions to acrylic adhesives (On-Body Injector only)  an unusual or allergic reaction to pegfilgrastim, filgrastim, other medicines, foods, dyes, or preservatives  pregnant or trying to get pregnant  breast-feeding How should I use this medicine? This medicine is for injection under the skin. If you get this medicine at home, you will be taught how to prepare and give the pre-filled syringe or how to use the On-body  Injector. Refer to the patient Instructions for Use for detailed instructions. Use exactly as directed. Tell your healthcare provider immediately if you suspect that the On-body Injector may not have performed as intended or if you suspect the use of the On-body Injector resulted in a missed or partial dose. It   is important that you put your used needles and syringes in a special sharps container. Do not put them in a trash can. If you do not have a sharps container, call your pharmacist or healthcare provider to get one. Talk to your pediatrician regarding the use of this medicine in children. While this drug may be prescribed for selected conditions, precautions do apply. Overdosage: If you think you have taken too much of this medicine contact a poison control center or emergency room at once. NOTE: This medicine is only for you. Do not share this medicine with others. What if I miss a dose? It is important not to miss your dose. Call your doctor or health care professional if you miss your dose. If you miss a dose due to an On-body Injector failure or leakage, a new dose should be administered as soon as possible using a single prefilled syringe for manual use. What may interact with this medicine? Interactions have not been studied. Give your health care provider a list of all the medicines, herbs, non-prescription drugs, or dietary supplements you use. Also tell them if you smoke, drink alcohol, or use illegal drugs. Some items may interact with your medicine. This list may not describe all possible interactions. Give your health care provider a list of all the medicines, herbs, non-prescription drugs, or dietary supplements you use. Also tell them if you smoke, drink alcohol, or use illegal drugs. Some items may interact with your medicine. What should I watch for while using this medicine? You may need blood work done while you are taking this medicine. If you are going to need a MRI, CT scan,  or other procedure, tell your doctor that you are using this medicine (On-Body Injector only). What side effects may I notice from receiving this medicine? Side effects that you should report to your doctor or health care professional as soon as possible:  allergic reactions like skin rash, itching or hives, swelling of the face, lips, or tongue  back pain  dizziness  fever  pain, redness, or irritation at site where injected  pinpoint red spots on the skin  red or dark-brown urine  shortness of breath or breathing problems  stomach or side pain, or pain at the shoulder  swelling  tiredness  trouble passing urine or change in the amount of urine Side effects that usually do not require medical attention (report to your doctor or health care professional if they continue or are bothersome):  bone pain  muscle pain This list may not describe all possible side effects. Call your doctor for medical advice about side effects. You may report side effects to FDA at 1-800-FDA-1088. Where should I keep my medicine? Keep out of the reach of children. If you are using this medicine at home, you will be instructed on how to store it. Throw away any unused medicine after the expiration date on the label. NOTE: This sheet is a summary. It may not cover all possible information. If you have questions about this medicine, talk to your doctor, pharmacist, or health care provider.  2020 [employer]/Gold Standard (2018-01-23 16:57:08)  

## 2020-11-07 NOTE — Telephone Encounter (Signed)
Patient has been advised of Pre-Admission date/time, COVID Testing date and Surgery date.  Surgery Date: 11/12/20 Preadmission Testing Date: 11/11/20 (phone 8a-1p) Covid Testing Date: 11/11/20 - patient advised to go to the [facility] ([facility] between 8a-11:00 am   Patient has been made aware to call [phone], between 1-3:00pm the day before surgery, to find out what time to arrive for surgery.

## 2020-11-10 ENCOUNTER — Ambulatory Visit
Admission: RE | Admit: 2020-11-10 | Discharge: 2020-11-10 | Disposition: A | Discharge status: Home / Self Care | Payer: 59 | Source: Ambulatory Visit | Attending: Oncology | Admitting: Oncology

## 2020-11-10 ENCOUNTER — Other Ambulatory Visit: Payer: Self-pay

## 2020-11-10 ENCOUNTER — Inpatient Hospital Stay (HOSPITAL_BASED_OUTPATIENT_CLINIC_OR_DEPARTMENT_OTHER): Payer: 59 | Admitting: Nurse Practitioner

## 2020-11-10 ENCOUNTER — Inpatient Hospital Stay: Payer: 59

## 2020-11-10 DIAGNOSIS — I10 Essential (primary) hypertension: Secondary | ICD-10-CM | POA: Insufficient documentation

## 2020-11-10 DIAGNOSIS — C50411 Malignant neoplasm of upper-outer quadrant of right female breast: Secondary | ICD-10-CM | POA: Diagnosis present

## 2020-11-10 DIAGNOSIS — Z0189 Encounter for other specified special examinations: Secondary | ICD-10-CM | POA: Diagnosis not present

## 2020-11-10 DIAGNOSIS — C50812 Malignant neoplasm of overlapping sites of left female breast: Secondary | ICD-10-CM

## 2020-11-10 DIAGNOSIS — Z170 Estrogen receptor positive status [ER+]: Secondary | ICD-10-CM

## 2020-11-10 LAB — ECHOCARDIOGRAM COMPLETE
AR max vel: 3.09 cm2
AV Area VTI: 3.15 cm2
AV Area mean vel: 3 cm2
AV Mean grad: 3 mmHg
AV Peak grad: 4.9 mmHg
Ao pk vel: 1.11 m/s
Area-P 1/2: 11.67 cm2
S' Lateral: 2.7 cm

## 2020-11-10 NOTE — Progress Notes (Signed)
*  PRELIMINARY RESULTS* Echocardiogram 2D Echocardiogram has been performed.  Michelle Walker 11/10/2020, 10:55 AM

## 2020-11-11 ENCOUNTER — Ambulatory Visit: Payer: 59 | Admitting: Oncology

## 2020-11-11 ENCOUNTER — Other Ambulatory Visit
Admission: RE | Admit: 2020-11-11 | Discharge: 2020-11-11 | Disposition: A | Discharge status: Home / Self Care | Payer: 59 | Source: Ambulatory Visit | Attending: Surgery | Admitting: Surgery

## 2020-11-11 ENCOUNTER — Other Ambulatory Visit: Payer: Self-pay

## 2020-11-11 ENCOUNTER — Ambulatory Visit: Payer: 59

## 2020-11-11 ENCOUNTER — Telehealth: Payer: Self-pay | Admitting: *Deleted

## 2020-11-11 ENCOUNTER — Other Ambulatory Visit: Payer: 59

## 2020-11-11 DIAGNOSIS — Z01818 Encounter for other preprocedural examination: Secondary | ICD-10-CM | POA: Insufficient documentation

## 2020-11-11 DIAGNOSIS — U071 COVID-19: Secondary | ICD-10-CM | POA: Insufficient documentation

## 2020-11-11 LAB — SARS CORONAVIRUS 2 (TAT 6-24 HRS): SARS Coronavirus 2: POSITIVE — AB

## 2020-11-11 NOTE — Patient Instructions (Signed)
Your procedure is scheduled on: Wednesday 11/12/20.  Report to THE FIRST FLOOR REGISTRATION DESK IN THE MEDICAL MALL ON THE MORNING OF SURGERY FIRST, THEN YOU WILL CHECK IN AT THE SURGERY INFORMATION DESK LOCATED OUTSIDE THE SAME DAY SURGERY DEPARTMENT LOCATED ON 2ND FLOOR MEDICAL MALL ENTRANCE.  To find out your arrival time please call [phone] between 1PM - 3PM on Tuesday 11/11/20.   Remember: Instructions that are not followed completely may result in serious medical risk, up to and including death, or upon the discretion of your surgeon and anesthesiologist your surgery may need to be rescheduled.     __X__ 1. Do not eat food after midnight the night before your procedure.                 No gum chewing or hard candies. You may drink clear liquids up to 2 hours                 before you are scheduled to arrive for your surgery- DO NOT drink clear                 liquids within 2 hours of the start of your surgery.                 Clear Liquids include:  water, apple juice without pulp, clear carbohydrate                 drink such as Clearfast or Gatorade, Black Coffee or Tea (Do not add                 milk or creamer to coffee or tea).  __X__2.  On the morning of surgery brush your teeth with toothpaste and water, you may rinse your mouth with mouthwash if you wish.  Do not swallow any toothpaste or mouthwash.    __X__ 3.  No Alcohol for 24 hours before or after surgery.  __X__ 4.  Do Not Smoke or use e-cigarettes For 24 Hours Prior to Your Surgery.                 Do not use any chewable tobacco products for at least 6 hours prior to                 surgery.  __X__5.  Notify your doctor if there is any change in your medical condition      (cold, fever, infections).      Do NOT wear jewelry, make-up, hairpins, clips or nail polish. Do NOT wear lotions, powders, or perfumes.  Do NOT shave 48 hours prior to surgery. Men may shave face and neck. Do NOT bring valuables to  the hospital.     [facility] is not responsible for any belongings or valuables.   Contacts, dentures/partials or body piercings may not be worn into surgery. Bring a case for your contacts, glasses or hearing aids, a denture cup will be supplied.   Patients discharged the day of surgery will not be allowed to drive home.     __X__ Take these medicines the morning of surgery with A SIP OF WATER:     1. NONE       __X__ Shower the night before and the morning of your surgery prior to your arrival.  __X__ Stop Anti-inflammatories 7 days before surgery such as Advil, Ibuprofen, Motrin, BC or Goodies Powder, Naprosyn, Naproxen, Aleve, Aspirin, Meloxicam. May take Tylenol if needed for pain or discomfort.  __X__Do not start taking any new herbal supplements or vitamins prior to your procedure.     Wear comfortable clothing (specific to your surgery type) to the hospital.  Plan for stool softeners for home use; pain medications have a tendency to cause constipation. You can also help prevent constipation by eating foods high in fiber such as fruits and vegetables and drinking plenty of fluids as your diet allows.  After surgery, you can prevent lung complications by doing breathing exercises.Take deep breaths and cough every 1-2 hours. Your doctor may order a device called an Incentive Spirometer to help you take deep breaths.  Please call the [facility] at [phone] if you have any questions about these instructions.

## 2020-11-11 NOTE — Telephone Encounter (Signed)
Michelle Walker called the pt. To reschedule the chemo cares appt. The pt was wanting to know when she was going to start chemo. She thought that she would start the day after she gets port placed. I told her that we had already scheduled her but then the date to see surgeon was after that date so we had to reschedule. We were not able to schedule this because we did not know the exact day for the port insertion. We made the appt for the chemo 1/20 arrival at 8 am for labs, then see md and then infusion. She knows this now and why. I did offer her to make an appt for chemo care. She states that she does not feel that she needs chemo care because rosa had went over resources

## 2020-11-12 ENCOUNTER — Telehealth: Payer: Self-pay | Admitting: Surgery

## 2020-11-12 ENCOUNTER — Telehealth: Payer: Self-pay

## 2020-11-12 ENCOUNTER — Ambulatory Visit
Admission: RE | Admit: 2020-11-12 | Discharge: 2020-11-12 | Disposition: A | Discharge status: Home / Self Care | Payer: 59 | Attending: Surgery | Admitting: Surgery

## 2020-11-12 NOTE — Progress Notes (Signed)
Deferring PAC placement due to positive COVID test.  Barb, please reschedule ASAP, per whatever protocol we have to go by.  Thanks.

## 2020-11-12 NOTE — Telephone Encounter (Signed)
Updated information regarding rescheduled surgery.  Patient tested positive 11/12/20, therefore surgery being rescheduled.    Patient has been advised of Pre-Admission date/time, COVID Testing date and Surgery date.  Surgery Date: 12/01/20 Preadmission Testing Date: 11/11/20 (already done) Covid Testing Date: 11/11/20, patient tested positive for Covid, no further testing needed for rescheduled surgery.    Patient has been made aware to call [phone], between 1-3:00pm the day before surgery, to find out what time to arrive for surgery.

## 2020-11-12 NOTE — Telephone Encounter (Signed)
Prescreened patient for monoclonal antibody infusion after recently testing positive for COVID. Patient does not qualify based off of lack of symptoms, co-morbid conditions and/or not a member of an at risk group.    Merit Maybee Lorita Officer, RN

## 2020-11-17 ENCOUNTER — Encounter (INDEPENDENT_AMBULATORY_CARE_PROVIDER_SITE_OTHER): Payer: 59 | Admitting: Vascular Surgery

## 2020-11-18 NOTE — Progress Notes (Deleted)
Hematology/Oncology Consult note [facility]  Telephone:([phone] [phone]  Patient Care Team: Casilda Carls, MD as PCP - General (Internal Medicine) Casilda Carls, MD as Consulting Physician (Internal Medicine) Christene Lye, MD (General Surgery) Garrel Ridgel, DPM as Consulting Physician (Podiatry)   Name of the patient: Michelle Walker  [id number]  12/14/1960   Date of visit: 11/18/20  Diagnosis- recurrent left breast cancer stage II mcT2 N1 M0 ER/PR positive HER-2 negative   Chief complaint/ Reason for visit-on treatment assessment prior to cycle 1 of dose dense AC chemotherapy  Heme/Onc history: Patient is a 60 year old female who was diagnosed with 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 She did not require adjuvant chemotherapy. She was initially on tamoxifen which she could not tolerate and was switched to letrozole. Last seen by me in June 2019 at which point she had a menstrual cycle and therefore not given another AI. She had subsequently did not follow-up with me and remained off hormone therapy.   More recently patient underwent a diagnostic bilateral mammogram which showed a 2.2 x 1.9 x 1.9 cm mass at the 12 o'clock position of the left breast 1 cm from the nipple. She was also found to have 5 other smaller breast masses ranging from 0.3 to 0.8 cm. Noted to have a solitary left axillary lymph node with cortical thickening. She had a biopsy of the dominant left breast mass as well as another smaller breast mass and the left axillary lymph node all of which were positive for metastatic invasive mammary carcinoma grade 2. Tumor was ER 91 200% positive PR 71 to 80% positive and HER-2 IHC equivocal but negative by FISH  MRI of the bilateral breasts showed no abnormal findings in the right breast.  At least 6 discrete hypoechoic masses in the  left breast with the largest one measuring 2.2 cm with 1 abnormal left axillary lymph node.  The size of the other breast masses ranging from 9m to 1.4 cm.  The total area of masses and non-mass enhancement was about 10 x 5 x 6.5 cm.  CT chest abdomen and pelvis with contrast showed no evidence of distant metastatic disease.  Bone scan was also negative.  MammaPrint done on the biopsy specimen came back as high risk with greater than 12% chemotherapy benefit  Interval history- ***  ECOG PS- *** Pain scale- *** Opioid associated constipation- ***  Review of systems- Review of Systems  Constitutional: Negative for chills, fever, malaise/fatigue and weight loss.  HENT: Negative for congestion, ear discharge and nosebleeds.   Eyes: Negative for blurred vision.  Respiratory: Negative for cough, hemoptysis, sputum production, shortness of breath and wheezing.   Cardiovascular: Negative for chest pain, palpitations, orthopnea and claudication.  Gastrointestinal: Negative for abdominal pain, blood in stool, constipation, diarrhea, heartburn, melena, nausea and vomiting.  Genitourinary: Negative for dysuria, flank pain, frequency, hematuria and urgency.  Musculoskeletal: Negative for back pain, joint pain and myalgias.  Skin: Negative for rash.  Neurological: Negative for dizziness, tingling, focal weakness, seizures, weakness and headaches.  Endo/Heme/Allergies: Does not bruise/bleed easily.  Psychiatric/Behavioral: Negative for depression and suicidal ideas. The patient does not have insomnia.      Allergies  Allergen Reactions  . Triamcinolone Other (See Comments)    Hypopigmentation s/p steroid injection  . Tape Rash    SURGICAL TAPE AFTER BREAST BIOPSY     Past Medical History:  Diagnosis Date  . Anemia   . Arthritis   . Breast cancer ([place]) 08/2015   1.5 mm invasive lobular cancer, 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 Wide excision/ RT, T1a,N0. ER/PR pos, Her 2.neg. Tamoxifen x 6 months, d/c  secondary to vasomotor sympotms.   . Headache   . Hypertension   . Personal history of radiation therapy 2016   RIGHT lumpectomy  . Thyroid disease      Past Surgical History:  Procedure Laterality Date  . ANTERIOR CRUCIATE LIGAMENT REPAIR Right   . BREAST BIOPSY Right 07/17/2015   INVASIVE LOBULAR CARCINOMA, CLASSIC TYPE (1.5 MM), ARISING IN A   . BREAST BIOPSY Left 09/19/2020   [place] bx 3 areas/ 1oc q clip/ 12 oc vision clip, axilla lymph node hydromark shape 3/ path pending  . BREAST LUMPECTOMY Right 08/22/2015  . BREAST LUMPECTOMY WITH SENTINEL LYMPH NODE BIOPSY Right 08/22/2015   Procedure: BREAST LUMPECTOMY WITH SENTINEL LYMPH NODE BX;  Surgeon: Christene Lye, MD;  Location: [facility];  Service: General;  Laterality: Right;  . COLONOSCOPY W/ POLYPECTOMY  2014  . COLONOSCOPY WITH PROPOFOL N/A 04/16/2019   Procedure: COLONOSCOPY WITH BIOPSIES;  Surgeon: Lucilla Lame, MD;  Location: [place];  Service: Endoscopy;  Laterality: N/A;  . DILATION AND CURETTAGE OF UTERUS     20 years ago  . NOVASURE ABLATION    . POLYPECTOMY N/A 04/16/2019   Procedure: POLYPECTOMY;  Surgeon: Lucilla Lame, MD;  Location: [place];  Service: Endoscopy;  Laterality: N/A;    Social History   Socioeconomic History  . Marital status: Married    Spouse name: Not on file  . Number of children: Not on file  . Years of education: Not on file  . Highest education level: Not on file  Occupational History  . Not on file  Tobacco Use  . Smoking status: Current Every Day Smoker    Packs/day: 0.50    Years: 40.00    Pack years: 20.00    Types: Cigarettes  . Smokeless tobacco: Never Used  Vaping Use  . Vaping Use: Never used  Substance and Sexual Activity  . Alcohol use: Yes    Alcohol/week: 2.0 standard drinks    Types: 2 Standard drinks or equivalent per week    Comment: BEER 1-2 QD  . Drug use: No  . Sexual activity: Yes    Comment: ablation  Other Topics Concern  . Not  on file  Social History Narrative  . Not on file   Social Determinants of Health   Financial Resource Strain: Not on file  Food Insecurity: Not on file  Transportation Needs: Not on file  Physical Activity: Not on file  Stress: Not on file  Social Connections: Not on file  Intimate Partner Violence: Not on file    Family History  Problem Relation Age of Onset  . Stroke Mother   . Cancer Sister        sarcoma on arm; chemo  . Diabetes Sister   . Stroke Sister   . Diabetes Brother   . Breast cancer Neg Hx   . Ovarian cancer Neg Hx   . Colon cancer Neg Hx   . Heart disease Neg Hx      Current Outpatient Medications:  .  allopurinol (ZYLOPRIM) 100 MG tablet, Take 100 mg by mouth at bedtime., Disp: , Rfl:  .  amLODipine (NORVASC) 10 MG tablet, Take 10 mg by mouth at bedtime. , Disp: , Rfl: 0 .  Calcium Carb-Cholecalciferol (CALCIUM 600+D3 PO), Take 1 tablet by mouth at bedtime., Disp: , Rfl:  .  dexamethasone (DECADRON) 4 MG tablet, Take 2 tablets (8 mg total) by mouth daily. Take daily for 3 days after chemo. Take with food., Disp: 30 tablet, Rfl: 1 .  lidocaine-prilocaine (EMLA) cream, Apply to affected area once, Disp: 30 g, Rfl: 3 .  LORazepam (ATIVAN) 0.5 MG tablet, Take 1 tablet (0.5 mg total) by mouth every 6 (six) hours as needed (Nausea or vomiting)., Disp: 30 tablet, Rfl: 0 .  losartan (COZAAR) 100 MG tablet, Take 100 mg by mouth at bedtime. , Disp: , Rfl: 0 .  metoprolol succinate (TOPROL-XL) 25 MG 24 hr tablet, Take 50 mg by mouth at bedtime., Disp: , Rfl: 0 .  ondansetron (ZOFRAN) 8 MG tablet, Take 1 tablet (8 mg total) by mouth 2 (two) times daily as needed. Start on the third day after chemotherapy., Disp: 30 tablet, Rfl: 1 .  prochlorperazine (COMPAZINE) 10 MG tablet, Take 1 tablet (10 mg total) by mouth every 6 (six) hours as needed (Nausea or vomiting)., Disp: 30 tablet, Rfl: 1 .  PROVENTIL HFA 108 (90 Base) MCG/ACT inhaler, Inhale 2 puffs into the lungs every 6  (six) hours as needed (wheezing/shortness of breath.)., Disp: , Rfl:   Physical exam: There were no vitals filed for this visit. Physical Exam HENT:     Head: Normocephalic and atraumatic.  Eyes:     Extraocular Movements: EOM normal.     Pupils: Pupils are equal, round, and reactive to light.  Cardiovascular:     Rate and Rhythm: Normal rate and regular rhythm.     Heart sounds: Normal heart sounds.  Pulmonary:     Effort: Pulmonary effort is normal.     Breath sounds: Normal breath sounds.  Abdominal:     General: Bowel sounds are normal.     Palpations: Abdomen is soft.  Musculoskeletal:     Cervical back: Normal range of motion.  Skin:    General: Skin is warm and dry.  Neurological:     Mental Status: She is alert and oriented to person, place, and time.      CMP Latest Ref Rng & Units 10/28/2020  Glucose 65 - 99 mg/dL -  BUN 6 - 20 mg/dL -  Creatinine 0.44 - 1.00 mg/dL 0.70  Sodium 135 - 145 mmol/L -  Potassium 3.5 - 5.1 mmol/L -  Chloride 101 - 111 mmol/L -  CO2 22 - 32 mmol/L -  Calcium 8.9 - 10.3 mg/dL -  Total Protein 6.5 - 8.1 g/dL -  Total Bilirubin 0.3 - 1.2 mg/dL -  Alkaline Phos 38 - 126 U/L -  AST 15 - 41 U/L -  ALT 14 - 54 U/L -   CBC Latest Ref Rng & Units 10/08/2015  WBC 3.6 - 11.0 K/uL 4.6  Hemoglobin 12.0 - 16.0 g/dL 13.7  Hematocrit 35.0 - 47.0 % 42.2  Platelets 150 - 440 K/uL 176    No images are attached to the encounter.  NM Bone Scan Whole Body  Result Date: 10/29/2020 CLINICAL DATA:  Breast carcinoma EXAM: NUCLEAR MEDICINE WHOLE BODY BONE SCAN TECHNIQUE: Whole body anterior and posterior images were obtained approximately 3 hours after intravenous injection of radiopharmaceutical. RADIOPHARMACEUTICALS:  20.561 mCi Technetium-51mMDP IV COMPARISON:  CT chest, abdomen, and pelvis October 28, 2020 FINDINGS: There is increased radiotracer uptake in the wrists, shoulders, and right midfoot region, findings likely of arthropathic etiology.  There  are no findings suggesting bony metastatic disease. Kidneys noted in the flank positions bilaterally. IMPRESSION: No demonstrable bony metastatic disease. Increased uptake in major joints is likely of arthropathic etiology. Electronically Signed   By: Lowella Grip III M.D.   On: 10/29/2020 08:20   CT CHEST ABDOMEN PELVIS W CONTRAST  Result Date: 10/28/2020 CLINICAL DATA:  Left breast cancer initial workup, prior right lumpectomy in 2016. EXAM: CT CHEST, ABDOMEN, AND PELVIS WITH CONTRAST TECHNIQUE: Multidetector CT imaging of the chest, abdomen and pelvis was performed following the standard protocol during bolus administration of intravenous contrast. CONTRAST:  156m OMNIPAQUE IOHEXOL 300 MG/ML  SOLN COMPARISON:  Breast MRI dated 12/18/2019 and CT abdomen from 08/05/2015 FINDINGS: CT CHEST FINDINGS Cardiovascular: Coronary, aortic arch, and branch vessel atherosclerotic vascular disease. Mediastinum/Nodes: Right lower paratracheal node 1.0 cm in short axis, borderline prominent, image 25 series 2. Indistinct nodularity in the left thyroid including a 1.2 cm thyroid nodule previously worked up on thyroid ultrasound of 04/20/2005. Not clinically significant; no follow-up imaging recommended (ref: J Am Coll Radiol. 2015 Feb;12(2): 143-50). Lungs/Pleura: Subtle reticulonodular opacity in the left upper lobe and superior segment left lower lobe probably represents minimal atypical infectious process. Musculoskeletal: Thoracic spondylosis. Clips are noted in the left breast. Post lumpectomy findings in the right breast. CT ABDOMEN PELVIS FINDINGS Hepatobiliary: Punctate calcification posteriorly in the right hepatic lobe on image 62 series 2, probably due to remote inflammatory process, and unchanged from 2016. Mildly contracted gallbladder. No biliary dilatation. Pancreas: Unremarkable Spleen: Unremarkable Adrenals/Urinary Tract: Both adrenal glands appear normal. 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 Urinary bladder unremarkable. Stomach/Bowel: Colonic diverticulosis most striking in the distal descending colon, sigmoid colon, and cecum/ascending colon. There also a few small diverticula of the terminal ileum. Normal appendix. No findings of active diverticulitis. Wall thickening in the proximal sigmoid colon is likely associated with the diverticular disease. Vascular/Lymphatic: Aortoiliac atherosclerotic vascular disease. No pathologic adenopathy. Reproductive: Unremarkable Other: No supplemental non-categorized findings. Musculoskeletal: Degenerative disc disease at L5-S1. IMPRESSION: 1. No specific findings of metastatic disease to the chest, abdomen, or pelvis. There is a borderline prominent right lower paratracheal lymph node which merit surveillance. 2. Subtle reticulonodular opacity in the left upper lobe and superior segment left lower lobe probably represents minimal atypical infectious process. 3. Other imaging findings of potential clinical significance: Coronary, aortic arch, and branch vessel atherosclerotic vascular disease. Post lumpectomy findings in the right breast. 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 Degenerative disc disease at L5-S1. 4. Aortic atherosclerosis. Aortic Atherosclerosis (ICD10-I70.0). Electronically Signed   By: WVan ClinesM.D.   On: 10/28/2020 15:10   ECHOCARDIOGRAM COMPLETE  Result Date: 11/10/2020    ECHOCARDIOGRAM REPORT   Patient Name:   CKEONA BILYEUDate of Exam: 11/10/2020 Medical Rec #:  [id number]      Height:       66.0 in Accession #:    [id number]     Weight:       190.6 lb Date of Birth:  [birth year]     BSA:          1.959 m Patient Age:    89years        BP:           164/91 mmHg Patient Gender: F               HR:  89 bpm. Exam Location:  [facility] Procedure: 2D Echo, Color Doppler, Cardiac Doppler and Strain Analysis Indications:     Z09 Chemo  History:          Patient has no prior history of Echocardiogram examinations.                  Risk Factors:Hypertension. Breast cancer.  Sonographer:     Charmayne Sheer RDCS (AE) Referring Phys:  [id number] [place]on Anna Amias Hutchinson Diagnosing Phys: Ida Rogue MD  Sonographer Comments: Global longitudinal strain was attempted. IMPRESSIONS  1. Left ventricular ejection fraction, by estimation, is 60 to 65%. The left ventricle has normal function. The left ventricle has no regional wall motion abnormalities. Left ventricular diastolic parameters are consistent with Grade I diastolic dysfunction (impaired relaxation). The average left ventricular global longitudinal strain is -15.2 %. The global longitudinal strain is normal.  2. Right ventricular systolic function is normal. The right ventricular size is normal.  3. The mitral valve is normal in structure. Mild mitral valve regurgitation. FINDINGS  Left Ventricle: Left ventricular ejection fraction, by estimation, is 60 to 65%. The left ventricle has normal function. The left ventricle has no regional wall motion abnormalities. The average left ventricular global longitudinal strain is -15.2 %. The global longitudinal strain is normal. The left ventricular internal cavity size was normal in size. There is no left ventricular hypertrophy. Left ventricular diastolic parameters are consistent with Grade I diastolic dysfunction (impaired relaxation). Right Ventricle: The right ventricular size is normal. No increase in right ventricular wall thickness. Right ventricular systolic function is normal. Left Atrium: Left atrial size was normal in size. Right Atrium: Right atrial size was normal in size. Pericardium: There is no evidence of pericardial effusion. Mitral Valve: The mitral valve is normal in structure. Mild mitral valve regurgitation. No evidence of mitral valve stenosis. MV peak gradient, 3.7 mmHg. The mean mitral valve gradient is 2.0 mmHg. Tricuspid Valve: The tricuspid valve is normal in  structure. Tricuspid valve regurgitation is not demonstrated. No evidence of tricuspid stenosis. Aortic Valve: The aortic valve was not well visualized. Aortic valve regurgitation is not visualized. No aortic stenosis is present. Aortic valve mean gradient measures 3.0 mmHg. Aortic valve peak gradient measures 4.9 mmHg. Aortic valve area, by VTI measures 3.15 cm. Pulmonic Valve: The pulmonic valve was normal in structure. Pulmonic valve regurgitation is not visualized. No evidence of pulmonic stenosis. Aorta: The aortic root is normal in size and structure. Venous: The inferior vena cava is normal in size with greater than 50% respiratory variability, suggesting right atrial pressure of 3 mmHg. IAS/Shunts: No atrial level shunt detected by color flow Doppler.  LEFT VENTRICLE PLAX 2D LVIDd:         3.80 cm  Diastology LVIDs:         2.70 cm  LV e' medial:    7.51 cm/s LV PW:         1.00 cm  LV E/e' medial:  9.1 LV IVS:        0.80 cm  LV e' lateral:   7.94 cm/s LVOT diam:     2.20 cm  LV E/e' lateral: 8.6 LV SV:         68 LV SV Index:   35       2D Longitudinal Strain LVOT Area:     3.80 cm 2D Strain GLS (A2C):   -15.2 %  2D Strain GLS (A3C):   -15.0 %                         2D Strain GLS (A4C):   -15.2 %                         2D Strain GLS Avg:     -15.2 % RIGHT VENTRICLE RV Basal diam:  2.90 cm TAPSE (M-mode): 1.9 cm LEFT ATRIUM             Index       RIGHT ATRIUM           Index LA diam:        2.90 cm 1.48 cm/m  RA Area:     16.30 cm LA Vol (A2C):   37.6 ml 19.19 ml/m RA Volume:   44.30 ml  22.61 ml/m LA Vol (A4C):   54.1 ml 27.61 ml/m LA Biplane Vol: 48.2 ml 24.60 ml/m  AORTIC VALVE                   PULMONIC VALVE AV Area (Vmax):    3.09 cm    PV Vmax:       0.79 m/s AV Area (Vmean):   3.00 cm    PV Vmean:      54.100 cm/s AV Area (VTI):     3.15 cm    PV VTI:        0.138 m AV Vmax:           111.00 cm/s PV Peak grad:  2.5 mmHg AV Vmean:          81.900 cm/s PV Mean  grad:  1.0 mmHg AV VTI:            0.215 m AV Peak Grad:      4.9 mmHg AV Mean Grad:      3.0 mmHg LVOT Vmax:         90.10 cm/s LVOT Vmean:        64.700 cm/s LVOT VTI:          0.178 m LVOT/AV VTI ratio: 0.83  AORTA Ao Root diam: 3.40 cm MITRAL VALVE MV Area (PHT): 11.67 cm   SHUNTS MV Peak grad:  3.7 mmHg    Systemic VTI:  0.18 m MV Mean grad:  2.0 mmHg    Systemic Diam: 2.20 cm MV Vmax:       0.96 m/s MV Vmean:      63.7 cm/s MV Decel Time: 65 msec MV E velocity: 68.60 cm/s MV A velocity: 96.00 cm/s MV E/A ratio:  0.71 Ida Rogue MD Electronically signed by Ida Rogue MD Signature Date/Time: 11/10/2020/1:08:12 PM    Final      Assessment and plan- Patient is a 60 y.o. female with anatomical stage IIA left breast invasive mammary carcinoma [id number] ER/PR positive and HER-2 negative.  She is here for on treatment assessment prior to cycle 1 of dose dense AC chemotherapy   Visit Diagnosis 1. Encounter for antineoplastic chemotherapy   2. Malignant neoplasm of upper-outer quadrant of right breast in female, estrogen receptor positive ([place])      Dr. [name], MD, MPH [facility] [phone] 11/18/2020 2:43 PM

## 2020-11-19 ENCOUNTER — Telehealth: Payer: Self-pay | Admitting: *Deleted

## 2020-11-19 NOTE — Telephone Encounter (Signed)
Called pt's cell phone and the voicemail is full and can't leave a message so I called her husband and told him that the port was delayed due to pt getting covid and knowing that we will r/s the chemo also after the date of port insertion. He will tell the pt. And he says that she already knows

## 2020-11-20 ENCOUNTER — Telehealth: Payer: Self-pay | Admitting: Licensed Clinical Social Worker

## 2020-11-20 ENCOUNTER — Inpatient Hospital Stay: Payer: 59 | Admitting: Oncology

## 2020-11-20 ENCOUNTER — Inpatient Hospital Stay: Payer: 59

## 2020-11-20 DIAGNOSIS — Z170 Estrogen receptor positive status [ER+]: Secondary | ICD-10-CM

## 2020-11-20 DIAGNOSIS — Z5111 Encounter for antineoplastic chemotherapy: Secondary | ICD-10-CM

## 2020-11-26 ENCOUNTER — Encounter: Payer: Self-pay | Admitting: Licensed Clinical Social Worker

## 2020-11-26 ENCOUNTER — Ambulatory Visit: Payer: Self-pay | Admitting: Licensed Clinical Social Worker

## 2020-11-26 DIAGNOSIS — Z1379 Encounter for other screening for genetic and chromosomal anomalies: Secondary | ICD-10-CM | POA: Insufficient documentation

## 2020-11-26 DIAGNOSIS — C50812 Malignant neoplasm of overlapping sites of left female breast: Secondary | ICD-10-CM

## 2020-11-26 DIAGNOSIS — C50411 Malignant neoplasm of upper-outer quadrant of right female breast: Secondary | ICD-10-CM

## 2020-11-26 DIAGNOSIS — Z170 Estrogen receptor positive status [ER+]: Secondary | ICD-10-CM

## 2020-11-26 NOTE — Telephone Encounter (Signed)
Revealed negative genetic testing.  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  We discussed that we do not know why she has breast cancer or why there is cancer in the family. It could be due to a different gene that we are not testing, or something our current technology cannot pick up.  It will be important for her to keep in contact with genetics to learn if additional testing may be needed in the future.

## 2020-11-26 NOTE — Progress Notes (Signed)
HPI:  Michelle Walker was previously seen in the [place] clinic due to a personal history of breast cancer and concerns regarding a hereditary predisposition to cancer. Please refer to our prior cancer genetics clinic note for more information regarding our discussion, assessment and recommendations, at the time. Michelle Walker recent genetic test results were disclosed to her, as were recommendations warranted by these results. These results and recommendations are discussed in more detail below.  CANCER HISTORY:  Oncology History  Breast cancer, left ([place])  11/04/2020 Initial Diagnosis   Breast cancer, left ([place])   11/04/2020 Cancer Staging   Staging form: Breast, AJCC 8th Edition - Clinical stage from 11/04/2020: Stage IIA (cT2, cN1, cM0, G2, ER+, PR+, HER2-) - Signed by Sindy Guadeloupe, MD on 11/04/2020   11/20/2020 -  Chemotherapy    Patient is on Treatment Plan: BREAST ADJUVANT DOSE DENSE AC Q14D / PACLITAXEL Q7D       Genetic Testing   Negative genetic testing. No pathogenic variants identified on the [facility] CancerNext-Expanded+RNA panel. 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 The report date is 11/19/2020.  The CancerNext-Expanded + RNAinsight gene panel offered by [employer] and includes sequencing and rearrangement analysis for the following 77 genes: IP, ALK, APC*, ATM*, AXIN2, BAP1, BARD1, BLM, BMPR1A, BRCA1*, BRCA2*, BRIP1*, CDC73, CDH1*,CDK4, CDKN1B, CDKN2A, CHEK2*, CTNNA1, DICER1, FANCC, FH, FLCN, GALNT12, KIF1B, LZTR1, MAX, MEN1, MET, MLH1*, MSH2*, MSH3, MSH6*, MUTYH*, NBN, NF1*, NF2, NTHL1, PALB2*, PHOX2B, PMS2*, POT1, PRKAR1A, PTCH1, PTEN*, RAD51C*, RAD51D*,RB1, RECQL, RET, SDHA, SDHAF2, SDHB, SDHC, SDHD, SMAD4, SMARCA4, SMARCB1, SMARCE1, STK11, SUFU, TMEM127, TP53*,TSC1, TSC2, VHL and XRCC2 (sequencing and deletion/duplication); EGFR, EGLN1, HOXB13, KIT, MITF, PDGFRA, POLD1 and POLE (sequencing only); EPCAM and GREM1 (deletion/duplication only). DNA and RNA analyses  performed for * genes.     FAMILY HISTORY:  We obtained a detailed, 4-generation family history.  Significant diagnoses are listed below: Family History  Problem Relation Age of Onset  . Stroke Mother   . Cancer Sister        sarcoma on arm; chemo  . Diabetes Sister   . Stroke Sister   . Diabetes Brother   . Breast cancer Neg Hx   . Ovarian cancer Neg Hx   . Colon cancer Neg Hx   . Heart disease Neg Hx    Michelle Walker has a son, 15 and a daughter, 54, no history of cancer. She has 3 grandchildren. She had 7 brothers and 4 sisters. One sister had sarcoma on her arm at 49 and living at 31.  Patient is unaware of any other family history of cancer. She had 18 maternal aunts/uncles, and 22 paternal aunts/uncles. All grandparents passed over the age of 31 with no known cancer history.  Michelle Walker is unaware of previous family history of genetic testing for hereditary cancer risks. There is no reported Ashkenazi Jewish ancestry. There is no known consanguinity.     GENETIC TEST RESULTS: Genetic testing reported out on 11/19/2020 through the [facility] CancerNext-Expanded+RNA cancer panel found no pathogenic mutations.   The CancerNext-Expanded + RNAinsight gene panel offered by [employer] and includes sequencing and rearrangement analysis for the following 77 genes: IP, ALK, APC*, ATM*, AXIN2, BAP1, BARD1, BLM, BMPR1A, BRCA1*, BRCA2*, BRIP1*, CDC73, CDH1*,CDK4, CDKN1B, CDKN2A, CHEK2*, CTNNA1, DICER1, FANCC, FH, FLCN, GALNT12, KIF1B, LZTR1, MAX, MEN1, MET, MLH1*, MSH2*, MSH3, MSH6*, MUTYH*, NBN, NF1*, NF2, NTHL1, PALB2*, PHOX2B, PMS2*, POT1, PRKAR1A, PTCH1, PTEN*, RAD51C*, RAD51D*,RB1, RECQL, RET, SDHA, SDHAF2, SDHB, SDHC, SDHD, SMAD4, SMARCA4,  SMARCB1, SMARCE1, STK11, SUFU, TMEM127, TP53*,TSC1, TSC2, VHL and XRCC2 (sequencing and deletion/duplication); EGFR, EGLN1, HOXB13, KIT, MITF, PDGFRA, POLD1 and POLE (sequencing only); EPCAM and GREM1 (deletion/duplication only).   The test report has been  scanned into EPIC and is located under the Molecular Pathology section of the Results Review tab.  A portion of the result report is included below for reference.     We discussed with Michelle Walker that because current genetic testing is not perfect, it is possible there may be a gene mutation in one of these genes that current testing cannot detect, but that chance is small.  We also discussed, that there could be another gene that has not yet been discovered, or that we have not yet tested, that is responsible for the cancer diagnoses in the family. It is also possible there is a hereditary cause for the cancer in the family that Michelle Walker did not inherit and therefore was not identified in her testing.  Therefore, it is important to remain in touch with cancer genetics in the future so that we can continue to offer Michelle Walker the most up to date genetic testing.   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  At this time, it is unknown if this variant is associated with increased cancer risk or if this is a normal finding, but most variants such as this get reclassified to being inconsequential. It should not be used to make medical management decisions. With time, we suspect the lab will determine the significance of this variant, if any. If we do learn more about it, we will try to contact Michelle Walker to discuss it further. However, it is important to stay in touch with [place] periodically and keep the address and phone number up to date.  ADDITIONAL GENETIC TESTING: We discussed with Michelle Walker that her genetic testing was fairly extensive.  If there are genes identified to increase cancer risk that can be analyzed in the future, we would be happy to discuss and coordinate this testing at that time.    CANCER SCREENING RECOMMENDATIONS: Michelle Walker test result is considered negative (normal).  This means that we have not identified a hereditary cause  for her  personal and family history of cancer at this time. Most cancers happen by chance and this negative test suggests that her cancer may fall into this category.    While reassuring, this does not definitively rule out a hereditary predisposition to cancer. It is still possible that there could be genetic mutations that are undetectable by current technology. There could be genetic mutations in genes that have not been tested or identified to increase cancer risk.  Therefore, it is recommended she continue to follow the cancer management and screening guidelines provided by her oncology and primary healthcare provider.   An individual's cancer risk and medical management are not determined by genetic test results alone. Overall cancer risk assessment incorporates additional factors, including personal medical history, family history, and any available genetic information that may result in a personalized plan for cancer prevention and surveillance.  RECOMMENDATIONS FOR FAMILY MEMBERS:  Relatives in this family might be at some increased risk of developing cancer, over the general population risk, simply due to the family history of cancer.  We recommended female relatives in this family have a yearly mammogram beginning at age 43, or 48 years younger than the earliest onset of cancer, an annual clinical breast exam,  and perform monthly breast self-exams. Female relatives in this family should also have a gynecological exam as recommended by their primary provider.  All family members should be referred for colonoscopy starting at age 15.   FOLLOW-UP: Lastly, we discussed with Michelle Walker that cancer genetics is a rapidly advancing field and it is possible that new genetic tests will be appropriate for her and/or her family members in the future. We encouraged her to remain in contact with cancer genetics on an annual basis so we can update her personal and family histories and let her know of advances in  cancer genetics that may benefit this family.   Our contact number was provided. Michelle Walker questions were answered to her satisfaction, and she knows she is welcome to call us at anytime with additional questions or concerns.   Faith Rogue, MS, [facility] Genetic Counselor [email or web address] Phone: [phone]

## 2020-12-01 ENCOUNTER — Ambulatory Visit: Payer: 59

## 2020-12-01 ENCOUNTER — Ambulatory Visit
Admission: RE | Admit: 2020-12-01 | Discharge: 2020-12-01 | Disposition: A | Discharge status: Home / Self Care | Payer: 59 | Attending: Surgery | Admitting: Surgery

## 2020-12-01 ENCOUNTER — Encounter: Payer: Self-pay | Admitting: Surgery

## 2020-12-01 ENCOUNTER — Encounter
Admission: RE | Disposition: A | Discharge status: Home / Self Care | Payer: Self-pay | Source: Home / Self Care | Attending: Surgery

## 2020-12-01 DIAGNOSIS — C50412 Malignant neoplasm of upper-outer quadrant of left female breast: Secondary | ICD-10-CM | POA: Diagnosis not present

## 2020-12-01 DIAGNOSIS — Z170 Estrogen receptor positive status [ER+]: Secondary | ICD-10-CM | POA: Diagnosis not present

## 2020-12-01 DIAGNOSIS — Z452 Encounter for adjustment and management of vascular access device: Secondary | ICD-10-CM

## 2020-12-01 DIAGNOSIS — C50912 Malignant neoplasm of unspecified site of left female breast: Secondary | ICD-10-CM | POA: Diagnosis present

## 2020-12-01 DIAGNOSIS — Z79899 Other long term (current) drug therapy: Secondary | ICD-10-CM | POA: Diagnosis not present

## 2020-12-01 DIAGNOSIS — Z808 Family history of malignant neoplasm of other organs or systems: Secondary | ICD-10-CM | POA: Insufficient documentation

## 2020-12-01 DIAGNOSIS — F17210 Nicotine dependence, cigarettes, uncomplicated: Secondary | ICD-10-CM | POA: Insufficient documentation

## 2020-12-01 DIAGNOSIS — Z888 Allergy status to other drugs, medicaments and biological substances status: Secondary | ICD-10-CM | POA: Diagnosis not present

## 2020-12-01 DIAGNOSIS — J939 Pneumothorax, unspecified: Secondary | ICD-10-CM

## 2020-12-01 HISTORY — PX: PORTACATH PLACEMENT: SHX2246

## 2020-12-01 LAB — BASIC METABOLIC PANEL
Anion gap: 10 (ref 5–15)
BUN: 10 mg/dL (ref 6–20)
CO2: 23 mmol/L (ref 22–32)
Calcium: 9.2 mg/dL (ref 8.9–10.3)
Chloride: 108 mmol/L (ref 98–111)
Creatinine, Ser: 0.62 mg/dL (ref 0.44–1.00)
GFR, Estimated: >60 mL/min (ref >60)
Glucose, Bld: 97 mg/dL (ref 70–99)
Potassium: 3.7 mmol/L (ref 3.5–5.1)
Sodium: 141 mmol/L (ref 135–145)

## 2020-12-01 LAB — CBC
HCT: 45.5 % (ref 36.0–46.0)
Hemoglobin: 14.8 g/dL (ref 12.0–15.0)
MCH: 27 pg (ref 26.0–34.0)
MCHC: 32.5 g/dL (ref 30.0–36.0)
MCV: 83 fL (ref 80.0–100.0)
Platelets: 182 10*3/uL (ref 150–400)
RBC: 5.48 MIL/uL — ABNORMAL HIGH (ref 3.87–5.11)
RDW: 13.2 % (ref 11.5–15.5)
WBC: 5.3 10*3/uL (ref 4.0–10.5)
nRBC: 0 % (ref 0.0–0.2)

## 2020-12-01 SURGERY — INSERTION, TUNNELED CENTRAL VENOUS DEVICE, WITH PORT
Anesthesia: General | Site: Chest | Laterality: Right

## 2020-12-01 MED ORDER — CELECOXIB 200 MG PO CAPS
ORAL_CAPSULE | ORAL | Status: AC
  Administered 2020-12-01: 200 mg via ORAL
  Filled 2020-12-01: qty 1

## 2020-12-01 MED ORDER — IBUPROFEN 800 MG PO TABS: 800.0000 mg | ORAL_TABLET | Freq: Three times a day (TID) | ORAL | 0 refills | Status: DC | PRN

## 2020-12-01 MED ORDER — ORAL CARE MOUTH RINSE: 15.0000 mL | Freq: Once | OROMUCOSAL | Status: AC

## 2020-12-01 MED ORDER — DEXAMETHASONE SODIUM PHOSPHATE 10 MG/ML IJ SOLN
INTRAMUSCULAR | Status: AC
  Filled 2020-12-01: qty 1

## 2020-12-01 MED ORDER — BUPIVACAINE-EPINEPHRINE (PF) 0.25% -1:200000 IJ SOLN
INTRAMUSCULAR | Status: DC | PRN
  Administered 2020-12-01: 20 mL via PERINEURAL

## 2020-12-01 MED ORDER — MIDAZOLAM HCL 2 MG/2ML IJ SOLN
INTRAMUSCULAR | Status: DC | PRN
  Administered 2020-12-01: 2 mg via INTRAVENOUS

## 2020-12-01 MED ORDER — ACETAMINOPHEN 500 MG PO TABS: 1000.0000 mg | ORAL_TABLET | ORAL | Status: AC

## 2020-12-01 MED ORDER — GABAPENTIN 300 MG PO CAPS
ORAL_CAPSULE | ORAL | Status: AC
  Administered 2020-12-01: 300 mg via ORAL
  Filled 2020-12-01: qty 1

## 2020-12-01 MED ORDER — CEFAZOLIN SODIUM-DEXTROSE 2-4 GM/100ML-% IV SOLN
INTRAVENOUS | Status: AC
  Filled 2020-12-01: qty 100

## 2020-12-01 MED ORDER — PROPOFOL 500 MG/50ML IV EMUL
INTRAVENOUS | Status: AC
  Filled 2020-12-01: qty 50

## 2020-12-01 MED ORDER — CHLORHEXIDINE GLUCONATE CLOTH 2 % EX PADS: 6.0000 | MEDICATED_PAD | Freq: Once | CUTANEOUS | Status: DC

## 2020-12-01 MED ORDER — PROMETHAZINE HCL 25 MG/ML IJ SOLN: 6.2500 mg | INTRAMUSCULAR | Status: DC | PRN

## 2020-12-01 MED ORDER — CHLORHEXIDINE GLUCONATE 0.12 % MT SOLN: 15.0000 mL | Freq: Once | OROMUCOSAL | Status: AC

## 2020-12-01 MED ORDER — FENTANYL CITRATE (PF) 100 MCG/2ML IJ SOLN
INTRAMUSCULAR | Status: DC | PRN
  Administered 2020-12-01 (×2): 25 ug via INTRAVENOUS

## 2020-12-01 MED ORDER — FAMOTIDINE 20 MG PO TABS
ORAL_TABLET | ORAL | Status: AC
  Administered 2020-12-01: 20 mg via ORAL
  Filled 2020-12-01: qty 1

## 2020-12-01 MED ORDER — FENTANYL CITRATE (PF) 100 MCG/2ML IJ SOLN: 25.0000 ug | INTRAMUSCULAR | Status: DC | PRN

## 2020-12-01 MED ORDER — ACETAMINOPHEN 500 MG PO TABS
ORAL_TABLET | ORAL | Status: AC
  Administered 2020-12-01: 1000 mg via ORAL
  Filled 2020-12-01: qty 2

## 2020-12-01 MED ORDER — DEXMEDETOMIDINE (PRECEDEX) IN NS 20 MCG/5ML (4 MCG/ML) IV SYRINGE
PREFILLED_SYRINGE | INTRAVENOUS | Status: DC | PRN
  Administered 2020-12-01: 12 ug via INTRAVENOUS
  Administered 2020-12-01: 8 ug via INTRAVENOUS

## 2020-12-01 MED ORDER — LIDOCAINE HCL (CARDIAC) PF 100 MG/5ML IV SOSY
PREFILLED_SYRINGE | INTRAVENOUS | Status: DC | PRN
  Administered 2020-12-01: 80 mg via INTRAVENOUS

## 2020-12-01 MED ORDER — CHLORHEXIDINE GLUCONATE 0.12 % MT SOLN
OROMUCOSAL | Status: AC
  Administered 2020-12-01: 15 mL via OROMUCOSAL
  Filled 2020-12-01: qty 15

## 2020-12-01 MED ORDER — SODIUM CHLORIDE 0.9 % IV SOLN
INTRAVENOUS | Status: DC | PRN
  Administered 2020-12-01: 7 mL via INTRAMUSCULAR

## 2020-12-01 MED ORDER — FENTANYL CITRATE (PF) 100 MCG/2ML IJ SOLN
INTRAMUSCULAR | Status: AC
  Filled 2020-12-01: qty 2

## 2020-12-01 MED ORDER — HEPARIN SODIUM (PORCINE) 5000 UNIT/ML IJ SOLN
INTRAMUSCULAR | Status: AC
  Filled 2020-12-01: qty 2

## 2020-12-01 MED ORDER — PHENYLEPHRINE HCL (PRESSORS) 10 MG/ML IV SOLN
INTRAVENOUS | Status: AC
  Filled 2020-12-01: qty 1

## 2020-12-01 MED ORDER — ONDANSETRON HCL 4 MG/2ML IJ SOLN
INTRAMUSCULAR | Status: AC
  Filled 2020-12-01: qty 2

## 2020-12-01 MED ORDER — LIDOCAINE HCL (PF) 2 % IJ SOLN
INTRAMUSCULAR | Status: AC
  Filled 2020-12-01: qty 5

## 2020-12-01 MED ORDER — SODIUM CHLORIDE 0.9 % IV SOLN
INTRAVENOUS | Status: DC | PRN
  Administered 2020-12-01: 5 mL via INTRAMUSCULAR

## 2020-12-01 MED ORDER — BUPIVACAINE-EPINEPHRINE (PF) 0.25% -1:200000 IJ SOLN
INTRAMUSCULAR | Status: AC
  Filled 2020-12-01: qty 30

## 2020-12-01 MED ORDER — LACTATED RINGERS IV SOLN: INTRAVENOUS | Status: DC

## 2020-12-01 MED ORDER — SODIUM CHLORIDE (PF) 0.9 % IJ SOLN
INTRAMUSCULAR | Status: AC
  Filled 2020-12-01: qty 50

## 2020-12-01 MED ORDER — PROPOFOL 500 MG/50ML IV EMUL
INTRAVENOUS | Status: DC | PRN
  Administered 2020-12-01: 125 ug/kg/min via INTRAVENOUS

## 2020-12-01 MED ORDER — GABAPENTIN 300 MG PO CAPS: 300.0000 mg | ORAL_CAPSULE | ORAL | Status: AC

## 2020-12-01 MED ORDER — CEFAZOLIN SODIUM-DEXTROSE 2-4 GM/100ML-% IV SOLN
2.0000 g | INTRAVENOUS | Status: AC
  Administered 2020-12-01: 2 g via INTRAVENOUS

## 2020-12-01 MED ORDER — CELECOXIB 200 MG PO CAPS: 200.0000 mg | ORAL_CAPSULE | ORAL | Status: AC

## 2020-12-01 MED ORDER — DEXMEDETOMIDINE (PRECEDEX) IN NS 20 MCG/5ML (4 MCG/ML) IV SYRINGE
PREFILLED_SYRINGE | INTRAVENOUS | Status: AC
  Filled 2020-12-01: qty 5

## 2020-12-01 MED ORDER — MIDAZOLAM HCL 2 MG/2ML IJ SOLN
INTRAMUSCULAR | Status: AC
  Filled 2020-12-01: qty 2

## 2020-12-01 MED ORDER — SODIUM CHLORIDE FLUSH 0.9 % IV SOLN
INTRAVENOUS | Status: AC
  Filled 2020-12-01: qty 10

## 2020-12-01 MED ORDER — HYDROCODONE-ACETAMINOPHEN 5-325 MG PO TABS: 1.0000 | ORAL_TABLET | Freq: Four times a day (QID) | ORAL | 0 refills | Status: DC | PRN

## 2020-12-01 MED ORDER — FAMOTIDINE 20 MG PO TABS: 20.0000 mg | ORAL_TABLET | Freq: Once | ORAL | Status: AC

## 2020-12-01 SURGICAL SUPPLY — 32 items
ADH SKN CLS APL DERMABOND .7 (GAUZE/BANDAGES/DRESSINGS) ×1
APL PRP STRL LF DISP 70% ISPRP (MISCELLANEOUS) ×1
BAG DECANTER FOR FLEXI CONT (MISCELLANEOUS) ×2 IMPLANT
BLADE SURG 15 STRL LF DISP TIS (BLADE) ×1 IMPLANT
BLADE SURG 15 STRL SS (BLADE) ×2
BOOT SUTURE AID YELLOW STND (SUTURE) ×2 IMPLANT
CANISTER SUCT 1200ML W/VALVE (MISCELLANEOUS) ×2 IMPLANT
CHLORAPREP W/TINT 26 (MISCELLANEOUS) ×2 IMPLANT
COVER LIGHT HANDLE STERIS (MISCELLANEOUS) ×4 IMPLANT
COVER WAND RF STERILE (DRAPES) ×2 IMPLANT
DERMABOND ADVANCED (GAUZE/BANDAGES/DRESSINGS) ×1
DERMABOND ADVANCED .7 DNX12 (GAUZE/BANDAGES/DRESSINGS) ×1 IMPLANT
DRAPE C-ARM XRAY 36X54 (DRAPES) ×2 IMPLANT
ELECT CAUTERY BLADE 6.4 (BLADE) ×2 IMPLANT
ELECT REM PT RETURN 9FT ADLT (ELECTROSURGICAL) ×2
ELECTRODE REM PT RTRN 9FT ADLT (ELECTROSURGICAL) ×1 IMPLANT
GLOVE ORTHO TXT STRL SZ7.5 (GLOVE) ×4 IMPLANT
GOWN STRL REUS W/ TWL LRG LVL3 (GOWN DISPOSABLE) ×2 IMPLANT
GOWN STRL REUS W/TWL LRG LVL3 (GOWN DISPOSABLE) ×4
IV NS 500ML (IV SOLUTION) ×2
IV NS 500ML BAXH (IV SOLUTION) ×1 IMPLANT
KIT PORT POWER 8FR ISP CVUE (Port) ×2 IMPLANT
KIT TURNOVER KIT A (KITS) ×2 IMPLANT
MANIFOLD NEPTUNE II (INSTRUMENTS) ×2 IMPLANT
NEEDLE HYPO 22GX1.5 SAFETY (NEEDLE) ×2 IMPLANT
PACK PORT-A-CATH (MISCELLANEOUS) ×2 IMPLANT
SUT MNCRL AB 4-0 PS2 18 (SUTURE) ×2 IMPLANT
SUT VIC AB 3-0 SH 27 (SUTURE) ×2
SUT VIC AB 3-0 SH 27X BRD (SUTURE) ×1 IMPLANT
SYR 10ML LL (SYRINGE) ×2 IMPLANT
SYR 3ML LL SCALE MARK (SYRINGE) ×2 IMPLANT
SYR 5ML LL (SYRINGE) ×2 IMPLANT

## 2020-12-01 NOTE — Discharge Instructions (Signed)

## 2020-12-01 NOTE — Transfer of Care (Signed)
Immediate Anesthesia Transfer of Care Note  Patient: Michelle Walker  Procedure(s) Performed: INSERTION PORT-A-CATH (Right Chest)  Patient Location: PACU  Anesthesia Type:General  Level of Consciousness: awake and alert   Airway & Oxygen Therapy: Patient Spontanous Breathing and Patient connected to nasal cannula oxygen  Post-op Assessment: Report given to RN and Post -op Vital signs reviewed and stable  Post vital signs: Reviewed and stable  Last Vitals:  Vitals Value Taken Time  BP 121/77 12/01/20 0816  Temp    Pulse 102 12/01/20 0820  Resp 27 12/01/20 0820  SpO2 99 % 12/01/20 0820  Vitals shown include unvalidated device data.  Last Pain:  Vitals:   12/01/20 0612  PainSc: 0-No pain         Complications: No complications documented.

## 2020-12-01 NOTE — Anesthesia Preprocedure Evaluation (Signed)
Anesthesia Evaluation  Patient identified by MRN, date of birth, ID band Patient awake    Reviewed: Allergy & Precautions, NPO status , Patient's Chart, lab work & pertinent test results, reviewed documented beta blocker date and time   History of Anesthesia Complications Negative for: history of anesthetic complications  Airway Mallampati: II  TM Distance: >3 FB Neck ROM: Full    Dental  (+) Chipped, Dental Advidsory Given, Teeth Intact   Pulmonary neg shortness of breath, neg COPD, neg recent URI, Current Smoker,    Pulmonary exam normal breath sounds clear to auscultation       Cardiovascular hypertension, Pt. on medications and Pt. on home beta blockers (-) angina(-) Past MI and (-) Cardiac Stents (-) dysrhythmias (-) Valvular Problems/Murmurs     Neuro/Psych  Headaches, neg Seizures PSYCHIATRIC DISORDERS Depression    GI/Hepatic negative GI ROS, Neg liver ROS,   Endo/Other  negative endocrine ROS  Renal/GU negative Renal ROS  negative genitourinary   Musculoskeletal  (+) Arthritis , Osteoarthritis,    Abdominal Normal abdominal exam  (+)   Peds negative pediatric ROS (+)  Hematology  (+) Blood dyscrasia, anemia ,   Anesthesia Other Findings Past Medical History: No date: Anemia No date: Arthritis 08/2015: Breast cancer ([place])     Comment:  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               Wide excision/ RT, T1a,N0. ER/PR pos, Her 2.neg.               Tamoxifen x 6 months, d/c secondary to vasomotor               sympotms.  No date: Headache No date: Hypertension 2016: Personal history of radiation therapy     Comment:  RIGHT lumpectomy No date: Thyroid disease   Reproductive/Obstetrics                             Anesthesia Physical  Anesthesia Plan  ASA: II  Anesthesia Plan: General   Post-op Pain Management:    Induction: Intravenous  PONV Risk Score  and Plan: 2 and TIVA and Propofol infusion  Airway Management Planned: Natural Airway and Nasal Cannula  Additional Equipment:   Intra-op Plan:   Post-operative Plan: Extubation in OR  Informed Consent: I have reviewed the patients History and Physical, chart, labs and discussed the procedure including the risks, benefits and alternatives for the proposed anesthesia with the patient or authorized representative who has indicated his/her understanding and acceptance.     Dental advisory given  Plan Discussed with: CRNA and Surgeon  Anesthesia Plan Comments:         Anesthesia Quick Evaluation

## 2020-12-01 NOTE — Interval H&P Note (Signed)
History and Physical Interval Note:  12/01/2020 7:23 AM  Michelle Walker  has presented today for surgery, with the diagnosis of left breast cancer.  The various methods of treatment have been discussed with the patient and family. After consideration of risks, benefits and other options for treatment, the patient has consented to  Procedure(s): INSERTION PORT-A-CATH (Right) as a surgical intervention.  The patient's history has been reviewed, patient examined, no change in status, stable for surgery.  I have reviewed the patient's chart and labs.  Questions were answered to the patient's satisfaction.     [name]

## 2020-12-01 NOTE — Op Note (Signed)
SURGICAL PROCEDURE REPORT  DATE OF PROCEDURE: 12/01/2020   ATTENDING SURGEON:  [name], M.D., FACS   ANESTHESIA: LMA    PRE-OPERATIVE DIAGNOSIS: Advanced left breast cancer requiring durable central venous access for chemotherapy   POST-OPERATIVE DIAGNOSIS: Same.  PROCEDURE: (cpt: [id number])  Insertion of right subclavian vein Bard PowerPort central venous catheter with subcutaneous port under fluoroscopic guidance   INTRAOPERATIVE FINDINGS: Fluoroscopic confirmation of guidewire in superior vena cava, tip at fluoroscopy appears to be at caval atrial junction, excellent return and easy flush, well-secured tunneled central venous catheter with subcutaneous port at completion of the procedure    FLUOROSCOPY: as recorded.   ESTIMATED BLOOD LOSS: Minimal (<20 mL)   SPECIMENS: None   IMPLANTS: 70F tunneled Bard PowerPort central venous catheter with subcutaneous port  DRAINS: None   COMPLICATIONS: None apparent   CONDITION AT COMPLETION: Hemodynamically stable, awake   DISPOSITION: PACU   INDICATION(S) FOR PROCEDURE:  Patient is a 60 y.o. female who presented with advanced/metastatic left breast cancer requiring durable central venous access for chemotherapy. All risks, benefits, and alternatives to above elective procedures were discussed with the patient, who elected to proceed, and informed consent was accordingly obtained at that time.  DETAILS OF PROCEDURE:  Patient was brought to the operative suite and appropriately identified. Right subclavian access site and planned port placement site were prepped and draped in the usual sterile fashion. Following a brief timeout, in Trendelenburg position, percutaneous Right subclavian venous access was obtained using Seldinger technique, by which local anesthetic was injected over the right subclavian region, and access needle was inserted into the SCV vein, through which soft guidewire was advanced, over which access needle was  withdrawn. Length of catheter needed to position the catheter tip at the atrio-caval junction was then measured under direct fluoroscopic visualization, after which the catheter was cut to the measured length. Guidewire was secured, attention was directed to injection of local anesthetic along the planned port site, 2-3 cm transverse ipsilateral chest incision was made and confirmed to accommodate the subcutaneous port, and flushed measured catheter was attached to port, then the port was placed within the subcutaneous pocket. Insertion sheath was advanced over the guidewire, which was withdrawn along with the insertion sheath dilator.  The catheter was then advanced through the sheath into the SCV and SVC.  Port was confirmed to withdraw blood and flush easily, after which concentrated heparin was instilled into the port and catheter. Dermis at the subcutaneous pocket was re-approximated using buried interrupted 3-0 Vicryl suture, and 4-0 Monocryl suture was used to re-approximate skin at the insertion/subcutaneous port site in running subcuticular fashion for the subcutaneous port and buried interrupted fashion for the insertion site. Skin was cleaned, dried, and sterile skin glue was applied. Patient was then safely transferred to PACU for a chest x-ray.  I was present for all aspects of the procedures, and no intraprocedural complications were apparent.   [name], M.D., [facility] 12/01/2020 8:27 AM

## 2020-12-02 NOTE — Anesthesia Postprocedure Evaluation (Signed)
Anesthesia Post Note  Patient: Michelle Walker  Procedure(s) Performed: INSERTION PORT-A-CATH (Right Chest)  Patient location during evaluation: PACU Anesthesia Type: General Level of consciousness: awake and alert Pain management: pain level controlled Vital Signs Assessment: post-procedure vital signs reviewed and stable Respiratory status: spontaneous breathing, nonlabored ventilation, respiratory function stable and patient connected to nasal cannula oxygen Cardiovascular status: blood pressure returned to baseline and stable Postop Assessment: no apparent nausea or vomiting Anesthetic complications: no   No complications documented.   Last Vitals:  Vitals:   12/01/20 0849 12/01/20 0902  BP: 129/80 (!) 148/85  Pulse: 96 93  Resp: (!) 21 18  Temp: (!) 36.4 C (!) 36.2 C  SpO2: 95% 95%    Last Pain:  Vitals:   12/02/20 0820  TempSrc:   PainSc: 0-No pain                 Martha Clan

## 2020-12-04 ENCOUNTER — Inpatient Hospital Stay (HOSPITAL_BASED_OUTPATIENT_CLINIC_OR_DEPARTMENT_OTHER): Payer: 59 | Admitting: Oncology

## 2020-12-04 ENCOUNTER — Inpatient Hospital Stay: Payer: 59

## 2020-12-04 ENCOUNTER — Inpatient Hospital Stay: Payer: 59 | Attending: Oncology

## 2020-12-04 ENCOUNTER — Encounter: Payer: Self-pay | Admitting: Oncology

## 2020-12-04 ENCOUNTER — Other Ambulatory Visit: Payer: Self-pay

## 2020-12-04 VITALS — BP 168/100 | HR 110 | Resp 20 | Wt 190.9 lb

## 2020-12-04 VITALS — BP 157/96 | HR 105

## 2020-12-04 DIAGNOSIS — I10 Essential (primary) hypertension: Secondary | ICD-10-CM | POA: Insufficient documentation

## 2020-12-04 DIAGNOSIS — Z5189 Encounter for other specified aftercare: Secondary | ICD-10-CM | POA: Insufficient documentation

## 2020-12-04 DIAGNOSIS — Z7952 Long term (current) use of systemic steroids: Secondary | ICD-10-CM | POA: Diagnosis not present

## 2020-12-04 DIAGNOSIS — F17210 Nicotine dependence, cigarettes, uncomplicated: Secondary | ICD-10-CM | POA: Insufficient documentation

## 2020-12-04 DIAGNOSIS — Z808 Family history of malignant neoplasm of other organs or systems: Secondary | ICD-10-CM | POA: Diagnosis not present

## 2020-12-04 DIAGNOSIS — Z833 Family history of diabetes mellitus: Secondary | ICD-10-CM | POA: Insufficient documentation

## 2020-12-04 DIAGNOSIS — C50411 Malignant neoplasm of upper-outer quadrant of right female breast: Secondary | ICD-10-CM

## 2020-12-04 DIAGNOSIS — Z8616 Personal history of COVID-19: Secondary | ICD-10-CM | POA: Insufficient documentation

## 2020-12-04 DIAGNOSIS — Z79899 Other long term (current) drug therapy: Secondary | ICD-10-CM | POA: Diagnosis not present

## 2020-12-04 DIAGNOSIS — Z170 Estrogen receptor positive status [ER+]: Secondary | ICD-10-CM

## 2020-12-04 DIAGNOSIS — Z5111 Encounter for antineoplastic chemotherapy: Secondary | ICD-10-CM | POA: Insufficient documentation

## 2020-12-04 DIAGNOSIS — E079 Disorder of thyroid, unspecified: Secondary | ICD-10-CM | POA: Insufficient documentation

## 2020-12-04 DIAGNOSIS — Z923 Personal history of irradiation: Secondary | ICD-10-CM | POA: Insufficient documentation

## 2020-12-04 DIAGNOSIS — C50812 Malignant neoplasm of overlapping sites of left female breast: Secondary | ICD-10-CM

## 2020-12-04 DIAGNOSIS — Z79810 Long term (current) use of selective estrogen receptor modulators (SERMs): Secondary | ICD-10-CM | POA: Diagnosis not present

## 2020-12-04 DIAGNOSIS — Z9221 Personal history of antineoplastic chemotherapy: Secondary | ICD-10-CM | POA: Insufficient documentation

## 2020-12-04 LAB — CBC WITH DIFFERENTIAL/PLATELET
Abs Immature Granulocytes: 0.02 10*3/uL (ref 0.00–0.07)
Basophils Absolute: 0 10*3/uL (ref 0.0–0.1)
Basophils Relative: 0 %
Eosinophils Absolute: 0.2 10*3/uL (ref 0.0–0.5)
Eosinophils Relative: 4 %
HCT: 42.6 % (ref 36.0–46.0)
Hemoglobin: 14 g/dL (ref 12.0–15.0)
Immature Granulocytes: 0 %
Lymphocytes Relative: 28 %
Lymphs Abs: 1.5 10*3/uL (ref 0.7–4.0)
MCH: 26.9 pg (ref 26.0–34.0)
MCHC: 32.9 g/dL (ref 30.0–36.0)
MCV: 81.9 fL (ref 80.0–100.0)
Monocytes Absolute: 0.6 10*3/uL (ref 0.1–1.0)
Monocytes Relative: 11 %
Neutro Abs: 3 10*3/uL (ref 1.7–7.7)
Neutrophils Relative %: 57 %
Platelets: 162 10*3/uL (ref 150–400)
RBC: 5.2 MIL/uL — ABNORMAL HIGH (ref 3.87–5.11)
RDW: 13 % (ref 11.5–15.5)
WBC: 5.4 10*3/uL (ref 4.0–10.5)
nRBC: 0 % (ref 0.0–0.2)

## 2020-12-04 LAB — COMPREHENSIVE METABOLIC PANEL
ALT: 14 U/L (ref 0–44)
AST: 23 U/L (ref 15–41)
Albumin: 3.7 g/dL (ref 3.5–5.0)
Alkaline Phosphatase: 71 U/L (ref 38–126)
Anion gap: 8 (ref 5–15)
BUN: 18 mg/dL (ref 6–20)
CO2: 24 mmol/L (ref 22–32)
Calcium: 9.8 mg/dL (ref 8.9–10.3)
Chloride: 104 mmol/L (ref 98–111)
Creatinine, Ser: 0.93 mg/dL (ref 0.44–1.00)
GFR, Estimated: >60 mL/min (ref >60)
Glucose, Bld: 102 mg/dL — ABNORMAL HIGH (ref 70–99)
Potassium: 3.8 mmol/L (ref 3.5–5.1)
Sodium: 136 mmol/L (ref 135–145)
Total Bilirubin: 0.6 mg/dL (ref 0.3–1.2)
Total Protein: 7.4 g/dL (ref 6.5–8.1)

## 2020-12-04 MED ORDER — SODIUM CHLORIDE 0.9 % IV SOLN
Freq: Once | INTRAVENOUS | Status: AC
  Filled 2020-12-04: qty 250

## 2020-12-04 MED ORDER — HEPARIN SOD (PORK) LOCK FLUSH 100 UNIT/ML IV SOLN
500.0000 [IU] | Freq: Once | INTRAVENOUS | Status: AC
  Administered 2020-12-04: 500 [IU] via INTRAVENOUS
  Filled 2020-12-04: qty 5

## 2020-12-04 MED ORDER — HEPARIN SOD (PORK) LOCK FLUSH 100 UNIT/ML IV SOLN
500.0000 [IU] | Freq: Once | INTRAVENOUS | Status: DC | PRN
  Filled 2020-12-04: qty 5

## 2020-12-04 MED ORDER — PEGFILGRASTIM 6 MG/0.6ML ~~LOC~~ PSKT
6.0000 mg | PREFILLED_SYRINGE | Freq: Once | SUBCUTANEOUS | Status: AC
  Administered 2020-12-04: 6 mg via SUBCUTANEOUS
  Filled 2020-12-04: qty 0.6

## 2020-12-04 MED ORDER — DOXORUBICIN HCL CHEMO IV INJECTION 2 MG/ML
60.0000 mg/m2 | Freq: Once | INTRAVENOUS | Status: AC
  Administered 2020-12-04: 120 mg via INTRAVENOUS
  Filled 2020-12-04: qty 50

## 2020-12-04 MED ORDER — SODIUM CHLORIDE 0.9 % IV SOLN
150.0000 mg | Freq: Once | INTRAVENOUS | Status: AC
  Administered 2020-12-04: 150 mg via INTRAVENOUS
  Filled 2020-12-04: qty 5

## 2020-12-04 MED ORDER — SODIUM CHLORIDE 0.9 % IV SOLN
600.0000 mg/m2 | Freq: Once | INTRAVENOUS | Status: AC
  Administered 2020-12-04: 1200 mg via INTRAVENOUS
  Filled 2020-12-04: qty 50

## 2020-12-04 MED ORDER — PALONOSETRON HCL INJECTION 0.25 MG/5ML
0.2500 mg | Freq: Once | INTRAVENOUS | Status: AC
  Administered 2020-12-04: 0.25 mg via INTRAVENOUS
  Filled 2020-12-04: qty 5

## 2020-12-04 MED ORDER — HEPARIN SOD (PORK) LOCK FLUSH 100 UNIT/ML IV SOLN
INTRAVENOUS | Status: AC
  Filled 2020-12-04: qty 5

## 2020-12-04 MED ORDER — SODIUM CHLORIDE 0.9 % IV SOLN
10.0000 mg | Freq: Once | INTRAVENOUS | Status: AC
  Administered 2020-12-04: 10 mg via INTRAVENOUS
  Filled 2020-12-04: qty 10

## 2020-12-04 MED ORDER — SODIUM CHLORIDE 0.9% FLUSH
10.0000 mL | INTRAVENOUS | Status: DC | PRN
  Administered 2020-12-04: 10 mL via INTRAVENOUS
  Filled 2020-12-04: qty 10

## 2020-12-04 NOTE — Progress Notes (Signed)
HR 105, BP 157/96. Per Dr Janese Banks okay to proceed with treatment today.  Tolerated infusion well. Stable at discharge

## 2020-12-04 NOTE — Progress Notes (Signed)
Hematology/Oncology Consult note [facility]  [phone] [phone]  Patient Care Team: Casilda Carls, MD as PCP - General (Internal Medicine) Casilda Carls, MD as Consulting Physician (Internal Medicine) Christene Lye, MD (General Surgery) Garrel Ridgel, [place] as Consulting Physician (Podiatry) Sindy Guadeloupe, MD as Consulting Physician (Hematology and Oncology)   Name of the patient: Michelle Walker  [id number]  1961/01/29   Date of visit: 12/04/20  Diagnosis- recurrent left breast cancer stage II [id number] ER/PR positive HER-2 negative  Chief complaint/ Reason for visit-on treatment assessment prior to cycle 1 of dose dense AC chemotherapy  Heme/Onc history: Patient is a 60 year old female who was diagnosed with 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 She did not require adjuvant chemotherapy. She was initially on tamoxifen which she could not tolerate and was switched to letrozole. Last seen by me in June 2019 at which point she had a menstrual cycle and therefore not given another AI. She had subsequently did not follow-up with me and remained off hormone therapy.   More recently patient underwent a diagnostic bilateral mammogram which showed a 2.2 x 1.9 x 1.9 cm mass at the 12 o'clock position of the left breast 1 cm from the nipple. She was also found to have 5 other smaller breast masses ranging from 0.3 to 0.8 cm. Noted to have a solitary left axillary lymph node with cortical thickening. She had a biopsy of the dominant left breast mass as well as another smaller breast mass and the left axillary lymph node all of which were positive for metastatic invasive mammary carcinoma grade 2. Tumor was ER 91 200% positive PR 71 to 80% positive and HER-2 IHC equivocal but negative by FISH  MRI of the bilateral breasts showed no abnormal findings in  the right breast.  At least 6 discrete hypoechoic masses in the left breast with the largest one measuring 2.2 cm with 1 abnormal left axillary lymph node.  The size of the other breast masses ranging from 16m to 1.4 cm.  The total area of masses and non-mass enhancement was about 10 x 5 x 6.5 cm.  CT chest abdomen and pelvis with contrast showed no evidence of distant metastatic disease.  Bone scan was also negative.  MammaPrint done on the biopsy specimen came back as high risk with greater than 12% chemotherapy benefit.  Plan is for neoadjuvant dose dense AC-Taxol chemotherapy followed by surgery.  Start of chemotherapy was delayed since patient tested positive for Covid  Interval history-patient is recovered well from Covid.  Presently reports some fatigue but denies other complaints  ECOG PS- 1 Pain scale- 0  Review of systems- Review of Systems  Constitutional: Positive for malaise/fatigue. Negative for chills, fever and weight loss.  HENT: Negative for congestion, ear discharge and nosebleeds.   Eyes: Negative for blurred vision.  Respiratory: Negative for cough, hemoptysis, sputum production, shortness of breath and wheezing.   Cardiovascular: Negative for chest pain, palpitations, orthopnea and claudication.  Gastrointestinal: Negative for abdominal pain, blood in stool, constipation, diarrhea, heartburn, melena, nausea and vomiting.  Genitourinary: Negative for dysuria, flank pain, frequency, hematuria and urgency.  Musculoskeletal: Negative for back pain, joint pain and myalgias.  Skin: Negative for rash.  Neurological: Negative for dizziness, tingling, focal weakness, seizures, weakness and headaches.  Endo/Heme/Allergies: Does not bruise/bleed easily.  Psychiatric/Behavioral: Negative for depression and suicidal ideas. The patient does not have insomnia.  Allergies  Allergen Reactions  . Triamcinolone Other (See Comments)    Hypopigmentation s/p steroid injection  . Tape  Rash    SURGICAL TAPE AFTER BREAST BIOPSY     Past Medical History:  Diagnosis Date  . Anemia   . Arthritis   . 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 Wide excision/ RT, T1a,N0. ER/PR pos, Her 2.neg. Tamoxifen x 6 months, d/c secondary to vasomotor sympotms.   . Headache   . Hypertension   . Personal history of radiation therapy 2016   RIGHT lumpectomy  . Thyroid disease      Past Surgical History:  Procedure Laterality Date  . ANTERIOR CRUCIATE LIGAMENT REPAIR Right   . BREAST BIOPSY Right 07/17/2015   INVASIVE LOBULAR CARCINOMA, CLASSIC TYPE (1.5 MM), ARISING IN A   . BREAST BIOPSY Left 09/19/2020   [place] bx 3 areas/ 1oc q clip/ 12 oc vision clip, axilla lymph node hydromark shape 3/ path pending  . BREAST LUMPECTOMY Right 08/22/2015  . BREAST LUMPECTOMY WITH SENTINEL LYMPH NODE BIOPSY Right 08/22/2015   Procedure: BREAST LUMPECTOMY WITH SENTINEL LYMPH NODE BX;  Surgeon: Christene Lye, MD;  Location: [facility];  Service: General;  Laterality: Right;  . COLONOSCOPY W/ POLYPECTOMY  2014  . COLONOSCOPY WITH PROPOFOL N/A 04/16/2019   Procedure: COLONOSCOPY WITH BIOPSIES;  Surgeon: Lucilla Lame, MD;  Location: [place];  Service: Endoscopy;  Laterality: N/A;  . DILATION AND CURETTAGE OF UTERUS     20 years ago  . NOVASURE ABLATION    . POLYPECTOMY N/A 04/16/2019   Procedure: POLYPECTOMY;  Surgeon: Lucilla Lame, MD;  Location: [place];  Service: Endoscopy;  Laterality: N/A;  . PORTACATH PLACEMENT Right 12/01/2020   Procedure: INSERTION PORT-A-CATH;  Surgeon: [name], MD;  Location: [facility];  Service: General;  Laterality: Right;    Social History   Socioeconomic History  . Marital status: Married    Spouse name: Not on file  . Number of children: Not on file  . Years of education: Not on file  . Highest education level: Not on file  Occupational History  . Not on file  Tobacco Use  . Smoking  status: Current Every Day Smoker    Packs/day: 0.50    Years: 40.00    Pack years: 20.00    Types: Cigarettes  . Smokeless tobacco: Never Used  Vaping Use  . Vaping Use: Never used  Substance and Sexual Activity  . Alcohol use: Yes    Alcohol/week: 2.0 standard drinks    Types: 2 Standard drinks or equivalent per week    Comment: BEER 1-2 QD  . Drug use: No  . Sexual activity: Yes    Comment: ablation  Other Topics Concern  . Not on file  Social History Narrative  . Not on file   Social Determinants of Health   Financial Resource Strain: Not on file  Food Insecurity: Not on file  Transportation Needs: Not on file  Physical Activity: Not on file  Stress: Not on file  Social Connections: Not on file  Intimate Partner Violence: Not on file    Family History  Problem Relation Age of Onset  . Stroke Mother   . Cancer Sister        sarcoma on arm; chemo  . Diabetes Sister   . Stroke Sister   . Diabetes Brother   . Breast cancer Neg Hx   . Ovarian cancer Neg  Hx   . Colon cancer Neg Hx   . Heart disease Neg Hx      Current Outpatient Medications:  .  allopurinol (ZYLOPRIM) 100 MG tablet, Take 100 mg by mouth at bedtime., Disp: , Rfl:  .  amLODipine (NORVASC) 10 MG tablet, Take 10 mg by mouth at bedtime. , Disp: , Rfl: 0 .  Calcium Carb-Cholecalciferol (CALCIUM 600+D3 PO), Take 1 tablet by mouth at bedtime., Disp: , Rfl:  .  dexamethasone (DECADRON) 4 MG tablet, Take 2 tablets (8 mg total) by mouth daily. Take daily for 3 days after chemo. Take with food., Disp: 30 tablet, Rfl: 1 .  HYDROcodone-acetaminophen (NORCO/VICODIN) 5-325 MG tablet, Take 1 tablet by mouth every 6 (six) hours as needed for moderate pain., Disp: 15 tablet, Rfl: 0 .  ibuprofen (ADVIL) 800 MG tablet, Take 1 tablet (800 mg total) by mouth every 8 (eight) hours as needed., Disp: 30 tablet, Rfl: 0 .  lidocaine-prilocaine (EMLA) cream, Apply to affected area once, Disp: 30 g, Rfl: 3 .  LORazepam (ATIVAN)  0.5 MG tablet, Take 1 tablet (0.5 mg total) by mouth every 6 (six) hours as needed (Nausea or vomiting)., Disp: 30 tablet, Rfl: 0 .  losartan (COZAAR) 100 MG tablet, Take 100 mg by mouth at bedtime. , Disp: , Rfl: 0 .  metoprolol succinate (TOPROL-XL) 25 MG 24 hr tablet, Take 50 mg by mouth at bedtime., Disp: , Rfl: 0 .  ondansetron (ZOFRAN) 8 MG tablet, Take 1 tablet (8 mg total) by mouth 2 (two) times daily as needed. Start on the third day after chemotherapy., Disp: 30 tablet, Rfl: 1 .  prochlorperazine (COMPAZINE) 10 MG tablet, Take 1 tablet (10 mg total) by mouth every 6 (six) hours as needed (Nausea or vomiting)., Disp: 30 tablet, Rfl: 1 .  PROVENTIL HFA 108 (90 Base) MCG/ACT inhaler, Inhale 2 puffs into the lungs every 6 (six) hours as needed (wheezing/shortness of breath.)., Disp: , Rfl:  No current facility-administered medications for this visit.  Facility-Administered Medications Ordered in Other Visits:  .  heparin lock flush 100 unit/mL, 500 Units, Intravenous, Once, [name] C, MD .  sodium chloride flush (NS) 0.9 % injection 10 mL, 10 mL, Intravenous, PRN, Sindy Guadeloupe, MD, 10 mL at 12/04/20 0849  Physical exam:  Vitals:   12/04/20 0934  BP: (!) 168/100  Pulse: (!) 110  Resp: 20  SpO2: 100%  Weight: 190 lb 14.4 oz (86.6 kg)   Physical Exam HENT:     Head: Normocephalic and atraumatic.  Eyes:     Extraocular Movements: EOM normal.  Cardiovascular:     Rate and Rhythm: Regular rhythm. Tachycardia present.     Heart sounds: Normal heart sounds.  Pulmonary:     Effort: Pulmonary effort is normal.     Breath sounds: Normal breath sounds.  Abdominal:     General: Bowel sounds are normal.     Palpations: Abdomen is soft.  Skin:    General: Skin is warm and dry.  Neurological:     Mental Status: She is alert and oriented to person, place, and time.      CMP Latest Ref Rng & Units 12/01/2020  Glucose 70 - 99 mg/dL 97  BUN 6 - 20 mg/dL 10  Creatinine 0.44 - 1.00  mg/dL 0.62  Sodium 135 - 145 mmol/L 141  Potassium 3.5 - 5.1 mmol/L 3.7  Chloride 98 - 111 mmol/L 108  CO2 22 - 32 mmol/L 23  Calcium 8.9 -  10.3 mg/dL 9.2  Total Protein 6.5 - 8.1 g/dL -  Total Bilirubin 0.3 - 1.2 mg/dL -  Alkaline Phos 38 - 126 U/L -  AST 15 - 41 U/L -  ALT 14 - 54 U/L -   CBC Latest Ref Rng & Units 12/01/2020  WBC 4.0 - 10.5 K/uL 5.3  Hemoglobin 12.0 - 15.0 g/dL 14.8  Hematocrit 36.0 - 46.0 % 45.5  Platelets 150 - 400 K/uL 182    No images are attached to the encounter.  DG CHEST PORT 1 VIEW  Result Date: 12/01/2020 CLINICAL DATA:  Chest port placement EXAM: PORTABLE CHEST 1 VIEW COMPARISON:  CT chest dated 10/28/2020 FINDINGS: Right chest port terminates at/just above the cavoatrial junction. Lungs are essentially clear.  No pleural effusion or pneumothorax. Heart is top-normal in size. IMPRESSION: Right chest port terminates at/just above the cavoatrial junction. No pneumothorax. Electronically Signed   By: Julian Hy M.D.   On: 12/01/2020 08:50   DG C-Arm 1-60 Min-No Report  Result Date: 12/01/2020 Fluoroscopy was utilized by the requesting physician.  No radiographic interpretation.   ECHOCARDIOGRAM COMPLETE  Result Date: 11/10/2020    ECHOCARDIOGRAM REPORT   Patient Name:   ANIE JUNIEL Date of Exam: 11/10/2020 Medical Rec #:  [id number]       Height:       66.0 in Accession #:    [id number]      Weight:       190.6 lb Date of Birth:  Mar 26, 1961      BSA:          1.959 m Patient Age:    44 years        BP:           164/91 mmHg Patient Gender: F               HR:           89 bpm. Exam Location:  [facility] Procedure: 2D Echo, Color Doppler, Cardiac Doppler and Strain Analysis Indications:     Z09 Chemo  History:         Patient has no prior history of Echocardiogram examinations.                  Risk Factors:Hypertension. Breast cancer.  Sonographer:     Charmayne Sheer RDCS (AE) Referring Phys:  [id number] Weston Anna Bekim Werntz Diagnosing Phys: Ida Rogue MD   Sonographer Comments: Global longitudinal strain was attempted. IMPRESSIONS  1. Left ventricular ejection fraction, by estimation, is 60 to 65%. The left ventricle has normal function. The left ventricle has no regional wall motion abnormalities. Left ventricular diastolic parameters are consistent with Grade I diastolic dysfunction (impaired relaxation). The average left ventricular global longitudinal strain is -15.2 %. The global longitudinal strain is normal.  2. Right ventricular systolic function is normal. The right ventricular size is normal.  3. The mitral valve is normal in structure. Mild mitral valve regurgitation. FINDINGS  Left Ventricle: Left ventricular ejection fraction, by estimation, is 60 to 65%. The left ventricle has normal function. The left ventricle has no regional wall motion abnormalities. The average left ventricular global longitudinal strain is -15.2 %. The global longitudinal strain is normal. The left ventricular internal cavity size was normal in size. There is no left ventricular hypertrophy. Left ventricular diastolic parameters are consistent with Grade I diastolic dysfunction (impaired relaxation). Right Ventricle: The right ventricular size is normal. No increase in right ventricular wall thickness. Right ventricular systolic  function is normal. Left Atrium: Left atrial size was normal in size. Right Atrium: Right atrial size was normal in size. Pericardium: There is no evidence of pericardial effusion. Mitral Valve: The mitral valve is normal in structure. Mild mitral valve regurgitation. No evidence of mitral valve stenosis. MV peak gradient, 3.7 mmHg. The mean mitral valve gradient is 2.0 mmHg. Tricuspid Valve: The tricuspid valve is normal in structure. Tricuspid valve regurgitation is not demonstrated. No evidence of tricuspid stenosis. Aortic Valve: The aortic valve was not well visualized. Aortic valve regurgitation is not visualized. No aortic stenosis is present. Aortic  valve mean gradient measures 3.0 mmHg. Aortic valve peak gradient measures 4.9 mmHg. Aortic valve area, by VTI measures 3.15 cm. Pulmonic Valve: The pulmonic valve was normal in structure. Pulmonic valve regurgitation is not visualized. No evidence of pulmonic stenosis. Aorta: The aortic root is normal in size and structure. Venous: The inferior vena cava is normal in size with greater than 50% respiratory variability, suggesting right atrial pressure of 3 mmHg. IAS/Shunts: No atrial level shunt detected by color flow Doppler.  LEFT VENTRICLE PLAX 2D LVIDd:         3.80 cm  Diastology LVIDs:         2.70 cm  LV e' medial:    7.51 cm/s LV PW:         1.00 cm  LV E/e' medial:  9.1 LV IVS:        0.80 cm  LV e' lateral:   7.94 cm/s LVOT diam:     2.20 cm  LV E/e' lateral: 8.6 LV SV:         68 LV SV Index:   35       2D Longitudinal Strain LVOT Area:     3.80 cm 2D Strain GLS (A2C):   -15.2 %                         2D Strain GLS (A3C):   -15.0 %                         2D Strain GLS (A4C):   -15.2 %                         2D Strain GLS Avg:     -15.2 % RIGHT VENTRICLE RV Basal diam:  2.90 cm TAPSE (M-mode): 1.9 cm LEFT ATRIUM             Index       RIGHT ATRIUM           Index LA diam:        2.90 cm 1.48 cm/m  RA Area:     16.30 cm LA Vol (A2C):   37.6 ml 19.19 ml/m RA Volume:   44.30 ml  22.61 ml/m LA Vol (A4C):   54.1 ml 27.61 ml/m LA Biplane Vol: 48.2 ml 24.60 ml/m  AORTIC VALVE                   PULMONIC VALVE AV Area (Vmax):    3.09 cm    PV Vmax:       0.79 m/s AV Area (Vmean):   3.00 cm    PV Vmean:      54.100 cm/s AV Area (VTI):     3.15 cm    PV VTI:  0.138 m AV Vmax:           111.00 cm/s PV Peak grad:  2.5 mmHg AV Vmean:          81.900 cm/s PV Mean grad:  1.0 mmHg AV VTI:            0.215 m AV Peak Grad:      4.9 mmHg AV Mean Grad:      3.0 mmHg LVOT Vmax:         90.10 cm/s LVOT Vmean:        64.700 cm/s LVOT VTI:          0.178 m LVOT/AV VTI ratio: 0.83  AORTA Ao Root diam: 3.40 cm  MITRAL VALVE MV Area (PHT): 11.67 cm   SHUNTS MV Peak grad:  3.7 mmHg    Systemic VTI:  0.18 m MV Mean grad:  2.0 mmHg    Systemic Diam: 2.20 cm MV Vmax:       0.96 m/s MV Vmean:      63.7 cm/s MV Decel Time: 65 msec MV E velocity: 68.60 cm/s MV A velocity: 96.00 cm/s MV E/A ratio:  0.71 Ida Rogue MD Electronically signed by Ida Rogue MD Signature Date/Time: 11/10/2020/1:08:12 PM    Final      Assessment and plan- Patient is a 60 y.o. female with prior history of right breast cancer s/p lumpectomy now with anatomical stage IIA left breast invasive mammary carcinoma [id number] ER/PR positive and HER-2 negative.  She is here for on treatment assessment prior to cycle 1 of neoadjuvant dose dense AC chemotherapy  Counts okay to proceed with cycle 1 of dose dense AC chemotherapy today with on for Neulasta support.  Discussed risks and benefits of chemotherapy including all but not limited to nausea, vomiting, low blood counts, risk of infections and hospitalizations.  Risk of cardiotoxicity associated with anthracycline.  Her baseline echocardiogram was normal.  I will see her back in 2 weeks time with CBC with differential, CMP for cycle 2 of dose dense AC chemotherapy   Visit Diagnosis 1. Encounter for antineoplastic chemotherapy   2. Malignant neoplasm of upper-outer quadrant of right breast in female, estrogen receptor positive ([place])      Dr. [name], MD, MPH [facility] [phone] 12/04/2020 9:06 AM

## 2020-12-04 NOTE — Progress Notes (Signed)
Patient did not answer for visit. Will attempt to reschedule.

## 2020-12-05 ENCOUNTER — Telehealth: Payer: Self-pay

## 2020-12-05 NOTE — Telephone Encounter (Signed)
Telephone call to patient for follow up after receiving first infusion.   No answer but left message stating we were calling to check on them.  Encouraged patient to call for any questions or concerns.   

## 2020-12-08 ENCOUNTER — Other Ambulatory Visit: Payer: 59

## 2020-12-08 ENCOUNTER — Ambulatory Visit: Payer: 59

## 2020-12-08 ENCOUNTER — Ambulatory Visit: Payer: 59 | Admitting: Oncology

## 2020-12-18 ENCOUNTER — Telehealth: Payer: Self-pay | Admitting: Oncology

## 2020-12-18 ENCOUNTER — Encounter: Payer: Self-pay | Admitting: Oncology

## 2020-12-18 ENCOUNTER — Inpatient Hospital Stay: Payer: 59

## 2020-12-18 ENCOUNTER — Inpatient Hospital Stay (HOSPITAL_BASED_OUTPATIENT_CLINIC_OR_DEPARTMENT_OTHER): Payer: 59 | Admitting: Oncology

## 2020-12-18 VITALS — BP 161/105 | HR 101 | Temp 97.9°F | Resp 20 | Wt 190.8 lb

## 2020-12-18 VITALS — BP 142/94 | HR 96

## 2020-12-18 DIAGNOSIS — C50812 Malignant neoplasm of overlapping sites of left female breast: Secondary | ICD-10-CM

## 2020-12-18 DIAGNOSIS — E876 Hypokalemia: Secondary | ICD-10-CM

## 2020-12-18 DIAGNOSIS — C50411 Malignant neoplasm of upper-outer quadrant of right female breast: Secondary | ICD-10-CM | POA: Diagnosis not present

## 2020-12-18 DIAGNOSIS — Z5111 Encounter for antineoplastic chemotherapy: Secondary | ICD-10-CM

## 2020-12-18 DIAGNOSIS — Z170 Estrogen receptor positive status [ER+]: Secondary | ICD-10-CM | POA: Diagnosis not present

## 2020-12-18 LAB — COMPREHENSIVE METABOLIC PANEL
ALT: 11 U/L (ref 0–44)
AST: 17 U/L (ref 15–41)
Albumin: 3.4 g/dL — ABNORMAL LOW (ref 3.5–5.0)
Alkaline Phosphatase: 77 U/L (ref 38–126)
Anion gap: 9 (ref 5–15)
BUN: 18 mg/dL (ref 6–20)
CO2: 23 mmol/L (ref 22–32)
Calcium: 9.1 mg/dL (ref 8.9–10.3)
Chloride: 107 mmol/L (ref 98–111)
Creatinine, Ser: 0.88 mg/dL (ref 0.44–1.00)
GFR, Estimated: >60 mL/min (ref >60)
Glucose, Bld: 111 mg/dL — ABNORMAL HIGH (ref 70–99)
Potassium: 3.4 mmol/L — ABNORMAL LOW (ref 3.5–5.1)
Sodium: 139 mmol/L (ref 135–145)
Total Bilirubin: 0.3 mg/dL (ref 0.3–1.2)
Total Protein: 6.7 g/dL (ref 6.5–8.1)

## 2020-12-18 LAB — CBC WITH DIFFERENTIAL/PLATELET
Abs Immature Granulocytes: 0.24 10*3/uL — ABNORMAL HIGH (ref 0.00–0.07)
Basophils Absolute: 0.1 10*3/uL (ref 0.0–0.1)
Basophils Relative: 1 %
Eosinophils Absolute: 0 10*3/uL (ref 0.0–0.5)
Eosinophils Relative: 0 %
HCT: 36.6 % (ref 36.0–46.0)
Hemoglobin: 11.9 g/dL — ABNORMAL LOW (ref 12.0–15.0)
Immature Granulocytes: 5 %
Lymphocytes Relative: 23 %
Lymphs Abs: 1.2 10*3/uL (ref 0.7–4.0)
MCH: 27 pg (ref 26.0–34.0)
MCHC: 32.5 g/dL (ref 30.0–36.0)
MCV: 83.2 fL (ref 80.0–100.0)
Monocytes Absolute: 0.5 10*3/uL (ref 0.1–1.0)
Monocytes Relative: 10 %
Neutro Abs: 3.2 10*3/uL (ref 1.7–7.7)
Neutrophils Relative %: 61 %
Platelets: 242 10*3/uL (ref 150–400)
RBC: 4.4 MIL/uL (ref 3.87–5.11)
RDW: 12.9 % (ref 11.5–15.5)
WBC: 5.2 10*3/uL (ref 4.0–10.5)
nRBC: 0 % (ref 0.0–0.2)

## 2020-12-18 MED ORDER — SODIUM CHLORIDE 0.9 % IV SOLN
Freq: Once | INTRAVENOUS | Status: AC
  Filled 2020-12-18: qty 250

## 2020-12-18 MED ORDER — SODIUM CHLORIDE 0.9 % IV SOLN
150.0000 mg | Freq: Once | INTRAVENOUS | Status: AC
  Administered 2020-12-18: 150 mg via INTRAVENOUS
  Filled 2020-12-18: qty 150

## 2020-12-18 MED ORDER — HEPARIN SOD (PORK) LOCK FLUSH 100 UNIT/ML IV SOLN
INTRAVENOUS | Status: AC
  Filled 2020-12-18: qty 5

## 2020-12-18 MED ORDER — SODIUM CHLORIDE 0.9 % IV SOLN
600.0000 mg/m2 | Freq: Once | INTRAVENOUS | Status: AC
  Administered 2020-12-18: 1200 mg via INTRAVENOUS
  Filled 2020-12-18: qty 50

## 2020-12-18 MED ORDER — PALONOSETRON HCL INJECTION 0.25 MG/5ML
0.2500 mg | Freq: Once | INTRAVENOUS | Status: AC
  Administered 2020-12-18: 0.25 mg via INTRAVENOUS
  Filled 2020-12-18: qty 5

## 2020-12-18 MED ORDER — DOXORUBICIN HCL CHEMO IV INJECTION 2 MG/ML
60.0000 mg/m2 | Freq: Once | INTRAVENOUS | Status: AC
  Administered 2020-12-18: 120 mg via INTRAVENOUS
  Filled 2020-12-18: qty 50

## 2020-12-18 MED ORDER — PEGFILGRASTIM 6 MG/0.6ML ~~LOC~~ PSKT: 6.0000 mg | PREFILLED_SYRINGE | Freq: Once | SUBCUTANEOUS | Status: DC

## 2020-12-18 MED ORDER — POTASSIUM CHLORIDE CRYS ER 20 MEQ PO TBCR: 20.0000 meq | EXTENDED_RELEASE_TABLET | Freq: Every day | ORAL | 0 refills | Status: DC

## 2020-12-18 MED ORDER — SODIUM CHLORIDE 0.9 % IV SOLN
10.0000 mg | Freq: Once | INTRAVENOUS | Status: AC
  Administered 2020-12-18: 10 mg via INTRAVENOUS
  Filled 2020-12-18: qty 10

## 2020-12-18 NOTE — Addendum Note (Signed)
Addended by: [name] C on: 12/18/2020 01:19 PM   Modules accepted: Orders

## 2020-12-18 NOTE — Progress Notes (Signed)
Patient states no new concerns. 

## 2020-12-18 NOTE — Progress Notes (Signed)
Pt evaluated by MD. Madaline Brilliant to treat. tx given as ordered. Completed without incident. Pt states has skin irritation from where neulasta onpro was last time. Ok per MD and per Otho Perl Chrismon- have insurance auth for pt to return tomorrow for neulasta injection. Pt aware . Pt discharged stable

## 2020-12-18 NOTE — Telephone Encounter (Signed)
Per Lorenda Hatchet R.N.--patient needs injection scheduled for tomorrow. Spoke with pt and confirmed 2/18 at 3:30.

## 2020-12-18 NOTE — Progress Notes (Signed)
HR 101 BP 161/105 ok to treat per MD

## 2020-12-18 NOTE — Progress Notes (Signed)
   Hematology/Oncology Consult note [facility]  [phone] [phone]  Patient Care Team: Jadali, Fayegh, MD as PCP - General (Internal Medicine) Jadali, Fayegh, MD as Consulting Physician (Internal Medicine) Sankar, Seeplaputhur G, MD (General Surgery) Hyatt, Max T, DPM as Consulting Physician (Podiatry) Rao, Archana C, MD as Consulting Physician (Hematology and Oncology)   Name of the patient: Michelle Walker  [id number]  02/21/1961   Date of visit: 12/18/20  Diagnosis- recurrent left breast cancer stage II [id number] ER/PR positive HER-2 negative  Chief complaint/ Reason for visit-all treatment assessment prior to cycle 2 of dose dense AC chemotherapy  Heme/Onc history: Patient is a 59-year-old female who was diagnosed with 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 She did not require adjuvant chemotherapy. She was initially on tamoxifen which she could not tolerate and was switched to letrozole. Last seen by me in June 2019 at which point she had a menstrual cycle and therefore not given another AI. She had subsequently did not follow-up with me and remained off hormone therapy.   More recently patient underwent a diagnostic bilateral mammogram which showed a 2.2 x 1.9 x 1.9 cm mass at the 12 o'clock position of the left breast 1 cm from the nipple. She was also found to have 5 other smaller breast masses ranging from 0.3 to 0.8 cm. Noted to have a solitary left axillary lymph node with cortical thickening. She had a biopsy of the dominant left breast mass as well as another smaller breast mass and the left axillary lymph node all of which were positive for metastatic invasive mammary carcinoma grade 2. Tumor was ER 91 200% positive PR 71 to 80% positive and HER-2 IHC equivocalbut negative by FISH  MRI of the bilateral breasts showed no abnormal findings in  the right breast. At least 6 discrete hypoechoic masses in the left breast with the largest one measuring 2.2 cm with 1 abnormal left axillary lymph node. The size of the other breast masses ranging from 4mm to 1.4 cm. The total area of masses and non-mass enhancement was about 10 x 5 x 6.5 cm.CT chest abdomen and pelvis with contrast showed no evidence of distant metastatic disease. Bone scan was also negative.  MammaPrint done on the biopsy specimen came back as high risk with greater than 12% chemotherapy benefit.  Plan is for neoadjuvant dose dense AC-Taxol chemotherapy followed by surgery.  Start of chemotherapy was delayed since patient tested positive for Covid  Interval history-she tolerated first cycle of dose dense AC chemotherapy well without any significant side effects.  She did not have any nausea vomiting.  She had a self-limited episode of abdominal cramping which resolved.  She continues to work full-time  ECOG PS- 1 Pain scale- 0  Review of systems- Review of Systems  Constitutional: Negative for chills, fever, malaise/fatigue and weight loss.  HENT: Negative for congestion, ear discharge and nosebleeds.   Eyes: Negative for blurred vision.  Respiratory: Negative for cough, hemoptysis, sputum production, shortness of breath and wheezing.   Cardiovascular: Negative for chest pain, palpitations, orthopnea and claudication.  Gastrointestinal: Negative for abdominal pain, blood in stool, constipation, diarrhea, heartburn, melena, nausea and vomiting.  Genitourinary: Negative for dysuria, flank pain, frequency, hematuria and urgency.  Musculoskeletal: Negative for back pain, joint pain and myalgias.  Skin: Negative for rash.  Neurological: Negative for dizziness, tingling, focal weakness, seizures, weakness and headaches.  Endo/Heme/Allergies: Does not   bruise/bleed easily.  Psychiatric/Behavioral: Negative for depression and suicidal ideas. The patient does not have insomnia.        Allergies  Allergen Reactions  . Triamcinolone Other (See Comments)    Hypopigmentation s/p steroid injection  . Tape Rash    SURGICAL TAPE AFTER BREAST BIOPSY     Past Medical History:  Diagnosis Date  . Anemia   . Arthritis   . 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 Wide excision/ RT, T1a,N0. ER/PR pos, Her 2.neg. Tamoxifen x 6 months, d/c secondary to vasomotor sympotms.   . Headache   . Hypertension   . Personal history of radiation therapy 2016   RIGHT lumpectomy  . Thyroid disease      Past Surgical History:  Procedure Laterality Date  . ANTERIOR CRUCIATE LIGAMENT REPAIR Right   . BREAST BIOPSY Right 07/17/2015   INVASIVE LOBULAR CARCINOMA, CLASSIC TYPE (1.5 MM), ARISING IN A   . BREAST BIOPSY Left 09/19/2020   us bx 3 areas/ 1oc q clip/ 12 oc vision clip, axilla lymph node hydromark shape 3/ path pending  . BREAST LUMPECTOMY Right 08/22/2015  . BREAST LUMPECTOMY WITH SENTINEL LYMPH NODE BIOPSY Right 08/22/2015   Procedure: BREAST LUMPECTOMY WITH SENTINEL LYMPH NODE BX;  Surgeon: Seeplaputhur G Sankar, MD;  Location: [facility];  Service: General;  Laterality: Right;  . COLONOSCOPY W/ POLYPECTOMY  2014  . COLONOSCOPY WITH PROPOFOL N/A 04/16/2019   Procedure: COLONOSCOPY WITH BIOPSIES;  Surgeon: Wohl, Darren, MD;  Location: [facility];  Service: Endoscopy;  Laterality: N/A;  . DILATION AND CURETTAGE OF UTERUS     20 years ago  . NOVASURE ABLATION    . POLYPECTOMY N/A 04/16/2019   Procedure: POLYPECTOMY;  Surgeon: Wohl, Darren, MD;  Location: [facility];  Service: Endoscopy;  Laterality: N/A;  . PORTACATH PLACEMENT Right 12/01/2020   Procedure: INSERTION PORT-A-CATH;  Surgeon: Rodenberg, Denny, MD;  Location: [facility];  Service: General;  Laterality: Right;    Social History   Socioeconomic History  . Marital status: Married    Spouse name: Not on file  . Number of children: Not on file  . Years  of education: Not on file  . Highest education level: Not on file  Occupational History  . Not on file  Tobacco Use  . Smoking status: Current Every Day Smoker    Packs/day: 0.50    Years: 40.00    Pack years: 20.00    Types: Cigarettes  . Smokeless tobacco: Never Used  Vaping Use  . Vaping Use: Never used  Substance and Sexual Activity  . Alcohol use: Yes    Alcohol/week: 2.0 standard drinks    Types: 2 Standard drinks or equivalent per week    Comment: BEER 1-2 QD  . Drug use: No  . Sexual activity: Yes    Comment: ablation  Other Topics Concern  . Not on file  Social History Narrative  . Not on file   Social Determinants of Health   Financial Resource Strain: Not on file  Food Insecurity: Not on file  Transportation Needs: Not on file  Physical Activity: Not on file  Stress: Not on file  Social Connections: Not on file  Intimate Partner Violence: Not on file    Family History  Problem Relation Age of Onset  . Stroke Mother   . Cancer Sister        sarcoma on arm; chemo  . Diabetes Sister   .   Stroke Sister   . Diabetes Brother   . Breast cancer Neg Hx   . Ovarian cancer Neg Hx   . Colon cancer Neg Hx   . Heart disease Neg Hx      Current Outpatient Medications:  .  allopurinol (ZYLOPRIM) 100 MG tablet, Take 100 mg by mouth at bedtime., Disp: , Rfl:  .  amLODipine (NORVASC) 10 MG tablet, Take 10 mg by mouth at bedtime. , Disp: , Rfl: 0 .  Calcium Carb-Cholecalciferol (CALCIUM 600+D3 PO), Take 1 tablet by mouth at bedtime., Disp: , Rfl:  .  dexamethasone (DECADRON) 4 MG tablet, Take 2 tablets (8 mg total) by mouth daily. Take daily for 3 days after chemo. Take with food. (Patient not taking: Reported on 12/04/2020), Disp: 30 tablet, Rfl: 1 .  HYDROcodone-acetaminophen (NORCO/VICODIN) 5-325 MG tablet, Take 1 tablet by mouth every 6 (six) hours as needed for moderate pain., Disp: 15 tablet, Rfl: 0 .  ibuprofen (ADVIL) 800 MG tablet, Take 1 tablet (800 mg total)  by mouth every 8 (eight) hours as needed., Disp: 30 tablet, Rfl: 0 .  lidocaine-prilocaine (EMLA) cream, Apply to affected area once, Disp: 30 g, Rfl: 3 .  LORazepam (ATIVAN) 0.5 MG tablet, Take 1 tablet (0.5 mg total) by mouth every 6 (six) hours as needed (Nausea or vomiting). (Patient not taking: Reported on 12/04/2020), Disp: 30 tablet, Rfl: 0 .  losartan (COZAAR) 100 MG tablet, Take 100 mg by mouth at bedtime. , Disp: , Rfl: 0 .  metoprolol succinate (TOPROL-XL) 25 MG 24 hr tablet, Take 50 mg by mouth at bedtime., Disp: , Rfl: 0 .  ondansetron (ZOFRAN) 8 MG tablet, Take 1 tablet (8 mg total) by mouth 2 (two) times daily as needed. Start on the third day after chemotherapy. (Patient not taking: Reported on 12/04/2020), Disp: 30 tablet, Rfl: 1 .  prochlorperazine (COMPAZINE) 10 MG tablet, Take 1 tablet (10 mg total) by mouth every 6 (six) hours as needed (Nausea or vomiting). (Patient not taking: Reported on 12/04/2020), Disp: 30 tablet, Rfl: 1 .  PROVENTIL HFA 108 (90 Base) MCG/ACT inhaler, Inhale 2 puffs into the lungs every 6 (six) hours as needed (wheezing/shortness of breath.). (Patient not taking: Reported on 12/04/2020), Disp: , Rfl:   Physical exam:  Vitals:   12/18/20 0859  BP: (!) 161/105  Pulse: (!) 101  Resp: 20  Temp: 97.9 F (36.6 C)  SpO2: 100%  Weight: 190 lb 12.8 oz (86.5 kg)   Physical Exam HENT:     Head: Normocephalic and atraumatic.  Eyes:     Extraocular Movements: EOM normal.     Pupils: Pupils are equal, round, and reactive to light.  Cardiovascular:     Rate and Rhythm: Normal rate and regular rhythm.     Heart sounds: Normal heart sounds.  Pulmonary:     Effort: Pulmonary effort is normal.     Breath sounds: Normal breath sounds.  Abdominal:     General: Bowel sounds are normal.     Palpations: Abdomen is soft.  Musculoskeletal:     Cervical back: Normal range of motion.  Skin:    General: Skin is warm and dry.  Neurological:     Mental Status: She is  alert and oriented to person, place, and time.     Breast exam: No palpable left breast mass or left axillary adenopathy.   CMP Latest Ref Rng & Units 12/04/2020  Glucose 70 - 99 mg/dL 102(H)  BUN 6 - 20   mg/dL 18  Creatinine 0.44 - 1.00 mg/dL 0.93  Sodium 135 - 145 mmol/L 136  Potassium 3.5 - 5.1 mmol/L 3.8  Chloride 98 - 111 mmol/L 104  CO2 22 - 32 mmol/L 24  Calcium 8.9 - 10.3 mg/dL 9.8  Total Protein 6.5 - 8.1 g/dL 7.4  Total Bilirubin 0.3 - 1.2 mg/dL 0.6  Alkaline Phos 38 - 126 U/L 71  AST 15 - 41 U/L 23  ALT 0 - 44 U/L 14   CBC Latest Ref Rng & Units 12/18/2020  WBC 4.0 - 10.5 K/uL 5.2  Hemoglobin 12.0 - 15.0 g/dL 11.9(L)  Hematocrit 36.0 - 46.0 % 36.6  Platelets 150 - 400 K/uL 242    No images are attached to the encounter.  DG CHEST PORT 1 VIEW  Result Date: 12/01/2020 CLINICAL DATA:  Chest port placement EXAM: PORTABLE CHEST 1 VIEW COMPARISON:  CT chest dated 10/28/2020 FINDINGS: Right chest port terminates at/just above the cavoatrial junction. Lungs are essentially clear.  No pleural effusion or pneumothorax. Heart is top-normal in size. IMPRESSION: Right chest port terminates at/just above the cavoatrial junction. No pneumothorax. Electronically Signed   By: Julian Hy M.D.   On: 12/01/2020 08:50   DG C-Arm 1-60 Min-No Report  Result Date: 12/01/2020 Fluoroscopy was utilized by the requesting physician.  No radiographic interpretation.     Assessment and plan- Patient is a 60 y.o. female  with prior history of right breast cancer s/p lumpectomy now with anatomical stage IIAleft breast invasive mammary carcinoma [id number] ER/PR positive and HER-2 negative.  She is here for on treatment assessment prior to cycle 2 of neoadjuvant dose dense AC chemotherapy  Counts okay to proceed with cycle 2 of dose dense AC chemotherapy with on for Neulasta support.  She will be seen by covering NP or MD in 2 weeks for cycle 3 and I will see her back in 4 weeks for cycle  4  Hypokalemia: We will send a prescription for oral potassium 20 mEq daily for 1 week.   Visit Diagnosis 1. Encounter for antineoplastic chemotherapy   2. Malignant neoplasm of upper-outer quadrant of right breast in female, estrogen receptor positive ([place])   3. Hypokalemia      Dr. [name], MD, MPH [facility] [phone] 12/18/2020 9:02 AM

## 2020-12-19 ENCOUNTER — Inpatient Hospital Stay: Payer: 59

## 2020-12-19 DIAGNOSIS — C50812 Malignant neoplasm of overlapping sites of left female breast: Secondary | ICD-10-CM

## 2020-12-19 DIAGNOSIS — Z5111 Encounter for antineoplastic chemotherapy: Secondary | ICD-10-CM | POA: Diagnosis not present

## 2020-12-19 DIAGNOSIS — Z170 Estrogen receptor positive status [ER+]: Secondary | ICD-10-CM

## 2020-12-19 MED ORDER — PEGFILGRASTIM INJECTION 6 MG/0.6ML ~~LOC~~
6.0000 mg | PREFILLED_SYRINGE | Freq: Once | SUBCUTANEOUS | Status: AC
Start: 2020-12-19 — End: 2020-12-19
  Administered 2020-12-19: 6 mg via SUBCUTANEOUS
  Filled 2020-12-19: qty 0.6

## 2021-01-01 ENCOUNTER — Inpatient Hospital Stay (HOSPITAL_BASED_OUTPATIENT_CLINIC_OR_DEPARTMENT_OTHER): Payer: 59 | Admitting: Nurse Practitioner

## 2021-01-01 ENCOUNTER — Inpatient Hospital Stay: Payer: 59

## 2021-01-01 ENCOUNTER — Inpatient Hospital Stay: Payer: 59 | Attending: Oncology

## 2021-01-01 VITALS — BP 144/89 | HR 84 | Temp 98.0°F | Resp 18 | Wt 190.0 lb

## 2021-01-01 DIAGNOSIS — Z5111 Encounter for antineoplastic chemotherapy: Secondary | ICD-10-CM | POA: Insufficient documentation

## 2021-01-01 DIAGNOSIS — Z8249 Family history of ischemic heart disease and other diseases of the circulatory system: Secondary | ICD-10-CM | POA: Insufficient documentation

## 2021-01-01 DIAGNOSIS — F17210 Nicotine dependence, cigarettes, uncomplicated: Secondary | ICD-10-CM | POA: Insufficient documentation

## 2021-01-01 DIAGNOSIS — C50812 Malignant neoplasm of overlapping sites of left female breast: Secondary | ICD-10-CM | POA: Insufficient documentation

## 2021-01-01 DIAGNOSIS — C50411 Malignant neoplasm of upper-outer quadrant of right female breast: Secondary | ICD-10-CM

## 2021-01-01 DIAGNOSIS — Z170 Estrogen receptor positive status [ER+]: Secondary | ICD-10-CM | POA: Insufficient documentation

## 2021-01-01 DIAGNOSIS — Z5189 Encounter for other specified aftercare: Secondary | ICD-10-CM | POA: Insufficient documentation

## 2021-01-01 DIAGNOSIS — Z7952 Long term (current) use of systemic steroids: Secondary | ICD-10-CM | POA: Insufficient documentation

## 2021-01-01 DIAGNOSIS — E079 Disorder of thyroid, unspecified: Secondary | ICD-10-CM | POA: Diagnosis not present

## 2021-01-01 DIAGNOSIS — Z923 Personal history of irradiation: Secondary | ICD-10-CM | POA: Insufficient documentation

## 2021-01-01 DIAGNOSIS — E876 Hypokalemia: Secondary | ICD-10-CM

## 2021-01-01 DIAGNOSIS — Z79899 Other long term (current) drug therapy: Secondary | ICD-10-CM | POA: Insufficient documentation

## 2021-01-01 DIAGNOSIS — I10 Essential (primary) hypertension: Secondary | ICD-10-CM | POA: Insufficient documentation

## 2021-01-01 DIAGNOSIS — Z808 Family history of malignant neoplasm of other organs or systems: Secondary | ICD-10-CM | POA: Insufficient documentation

## 2021-01-01 DIAGNOSIS — Z833 Family history of diabetes mellitus: Secondary | ICD-10-CM | POA: Diagnosis not present

## 2021-01-01 LAB — CBC WITH DIFFERENTIAL/PLATELET
Abs Immature Granulocytes: 0.5 10*3/uL — ABNORMAL HIGH (ref 0.00–0.07)
Basophils Absolute: 0.1 10*3/uL (ref 0.0–0.1)
Basophils Relative: 1 %
Eosinophils Absolute: 0 10*3/uL (ref 0.0–0.5)
Eosinophils Relative: 0 %
HCT: 36.9 % (ref 36.0–46.0)
Hemoglobin: 11.9 g/dL — ABNORMAL LOW (ref 12.0–15.0)
Immature Granulocytes: 7 %
Lymphocytes Relative: 16 %
Lymphs Abs: 1.2 10*3/uL (ref 0.7–4.0)
MCH: 27.4 pg (ref 26.0–34.0)
MCHC: 32.2 g/dL (ref 30.0–36.0)
MCV: 85 fL (ref 80.0–100.0)
Monocytes Absolute: 0.7 10*3/uL (ref 0.1–1.0)
Monocytes Relative: 9 %
Neutro Abs: 5.2 10*3/uL (ref 1.7–7.7)
Neutrophils Relative %: 67 %
Platelets: 174 10*3/uL (ref 150–400)
RBC: 4.34 MIL/uL (ref 3.87–5.11)
RDW: 13.4 % (ref 11.5–15.5)
WBC: 7.6 10*3/uL (ref 4.0–10.5)
nRBC: 0.3 % — ABNORMAL HIGH (ref 0.0–0.2)

## 2021-01-01 LAB — COMPREHENSIVE METABOLIC PANEL
ALT: 13 U/L (ref 0–44)
AST: 17 U/L (ref 15–41)
Albumin: 3.5 g/dL (ref 3.5–5.0)
Alkaline Phosphatase: 85 U/L (ref 38–126)
Anion gap: 11 (ref 5–15)
BUN: 11 mg/dL (ref 6–20)
CO2: 23 mmol/L (ref 22–32)
Calcium: 9.4 mg/dL (ref 8.9–10.3)
Chloride: 106 mmol/L (ref 98–111)
Creatinine, Ser: 0.72 mg/dL (ref 0.44–1.00)
GFR, Estimated: >60 mL/min (ref >60)
Glucose, Bld: 99 mg/dL (ref 70–99)
Potassium: 3.3 mmol/L — ABNORMAL LOW (ref 3.5–5.1)
Sodium: 140 mmol/L (ref 135–145)
Total Bilirubin: 0.3 mg/dL (ref 0.3–1.2)
Total Protein: 6.5 g/dL (ref 6.5–8.1)

## 2021-01-01 MED ORDER — SODIUM CHLORIDE 0.9 % IV SOLN
150.0000 mg | Freq: Once | INTRAVENOUS | Status: AC
  Administered 2021-01-01: 150 mg via INTRAVENOUS
  Filled 2021-01-01: qty 150

## 2021-01-01 MED ORDER — HEPARIN SOD (PORK) LOCK FLUSH 100 UNIT/ML IV SOLN
500.0000 [IU] | Freq: Once | INTRAVENOUS | Status: DC | PRN
  Filled 2021-01-01: qty 5

## 2021-01-01 MED ORDER — SODIUM CHLORIDE 0.9 % IV SOLN
10.0000 mg | Freq: Once | INTRAVENOUS | Status: AC
  Administered 2021-01-01: 10 mg via INTRAVENOUS
  Filled 2021-01-01: qty 10

## 2021-01-01 MED ORDER — DOXORUBICIN HCL CHEMO IV INJECTION 2 MG/ML
60.0000 mg/m2 | Freq: Once | INTRAVENOUS | Status: AC
Start: 2021-01-01 — End: 2021-01-01
  Administered 2021-01-01: 120 mg via INTRAVENOUS
  Filled 2021-01-01: qty 50

## 2021-01-01 MED ORDER — SODIUM CHLORIDE 0.9 % IV SOLN
Freq: Once | INTRAVENOUS | Status: AC
  Filled 2021-01-01: qty 250

## 2021-01-01 MED ORDER — SODIUM CHLORIDE 0.9 % IV SOLN
600.0000 mg/m2 | Freq: Once | INTRAVENOUS | Status: AC
  Administered 2021-01-01: 1200 mg via INTRAVENOUS
  Filled 2021-01-01: qty 50

## 2021-01-01 MED ORDER — POTASSIUM CHLORIDE CRYS ER 20 MEQ PO TBCR: 20.0000 meq | EXTENDED_RELEASE_TABLET | Freq: Every day | ORAL | 0 refills | Status: DC

## 2021-01-01 MED ORDER — PEGFILGRASTIM 6 MG/0.6ML ~~LOC~~ PSKT: 6.0000 mg | PREFILLED_SYRINGE | Freq: Once | SUBCUTANEOUS | Status: DC

## 2021-01-01 MED ORDER — HEPARIN SOD (PORK) LOCK FLUSH 100 UNIT/ML IV SOLN
INTRAVENOUS | Status: AC
  Filled 2021-01-01: qty 5

## 2021-01-01 MED ORDER — SODIUM CHLORIDE 0.9% FLUSH
10.0000 mL | Freq: Once | INTRAVENOUS | Status: AC
  Administered 2021-01-01: 10 mL via INTRAVENOUS
  Filled 2021-01-01: qty 10

## 2021-01-01 MED ORDER — PALONOSETRON HCL INJECTION 0.25 MG/5ML
0.2500 mg | Freq: Once | INTRAVENOUS | Status: AC
  Administered 2021-01-01: 0.25 mg via INTRAVENOUS
  Filled 2021-01-01: qty 5

## 2021-01-01 NOTE — Progress Notes (Signed)
Hematology/Oncology Progress Note [facility]  [phone] [phone]  Patient Care Team: Casilda Carls, MD as PCP - General (Internal Medicine) Casilda Carls, MD as Consulting Physician (Internal Medicine) Christene Lye, MD (General Surgery) Garrel Ridgel, [place] as Consulting Physician (Podiatry) Sindy Guadeloupe, MD as Consulting Physician (Hematology and Oncology)   Name of the patient: Michelle Walker  [id number]  10-18-1961   Date of visit: 01/01/21  Diagnosis- recurrent left breast cancer stage II [id number] ER/PR positive HER-2 negative  Chief complaint/ Reason for visit- Assessment prior to cycle 3 of dose dense AC Chemotherapy   Heme/Onc history: Patient is a 60 year old female who was diagnosed with 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 She did not require adjuvant chemotherapy. She was initially on tamoxifen which she could not tolerate and was switched to letrozole. Last seen by me in June 2019 at which point she had a menstrual cycle and therefore not given another AI. She had subsequently did not follow-up with me and remained off hormone therapy.   More recently patient underwent a diagnostic bilateral mammogram which showed a 2.2 x 1.9 x 1.9 cm mass at the 12 o'clock position of the left breast 1 cm from the nipple. She was also found to have 5 other smaller breast masses ranging from 0.3 to 0.8 cm. Noted to have a solitary left axillary lymph node with cortical thickening. She had a biopsy of the dominant left breast mass as well as another smaller breast mass and the left axillary lymph node all of which were positive for metastatic invasive mammary carcinoma grade 2. Tumor was ER 91 200% positive PR 71 to 80% positive and HER-2 IHC equivocalbut negative by FISH  MRI of the bilateral breasts showed no abnormal findings in the right  breast. At least 6 discrete hypoechoic masses in the left breast with the largest one measuring 2.2 cm with 1 abnormal left axillary lymph node. The size of the other breast masses ranging from 24m to 1.4 cm. The total area of masses and non-mass enhancement was about 10 x 5 x 6.5 cm.CT chest abdomen and pelvis with contrast showed no evidence of distant metastatic disease. Bone scan was also negative.  MammaPrint done on the biopsy specimen came back as high risk with greater than 12% chemotherapy benefit.  Plan is for neoadjuvant dose dense AC-Taxol chemotherapy followed by surgery.  Start of chemotherapy was delayed since patient tested positive for Covid  Interval history-patient returns to clinic for evaluation and consideration of cycle 3 of dose dense AC chemotherapy.  She tolerated first 2 cycles well without significant side effects.  Did report reaction to adhesive with OnPro Neulasta which has since resolved.  No nausea or vomiting.  Continues to work full-time.  ECOG PS- 1 Pain scale- 0  Review of systems- Review of Systems  Constitutional: Negative for chills, fever, malaise/fatigue and weight loss.  HENT: Negative for congestion, ear discharge and nosebleeds.   Eyes: Negative for blurred vision.  Respiratory: Negative for cough, hemoptysis, sputum production, shortness of breath and wheezing.   Cardiovascular: Negative for chest pain, palpitations, orthopnea and claudication.  Gastrointestinal: Negative for abdominal pain, blood in stool, constipation, diarrhea, heartburn, melena, nausea and vomiting.  Genitourinary: Negative for dysuria, flank pain, frequency, hematuria and urgency.  Musculoskeletal: Negative for back pain, joint pain and myalgias.  Skin: Negative for rash.  Neurological: Negative for dizziness, tingling, focal  weakness, seizures, weakness and headaches.  Endo/Heme/Allergies: Does not bruise/bleed easily.  Psychiatric/Behavioral: Negative for depression and  suicidal ideas. The patient does not have insomnia.      Allergies  Allergen Reactions  . Triamcinolone Other (See Comments)    Hypopigmentation s/p steroid injection  . Tape Rash    SURGICAL TAPE AFTER BREAST BIOPSY    Past Medical History:  Diagnosis Date  . Anemia   . Arthritis   . Breast cancer ([place]) 08/2015   1.5 mm invasive lobular cancer, 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 Wide excision/ RT, T1a,N0. ER/PR pos, Her 2.neg. Tamoxifen x 6 months, d/c secondary to vasomotor sympotms.   . Headache   . Hypertension   . Personal history of radiation therapy 2016   RIGHT lumpectomy  . Thyroid disease     Past Surgical History:  Procedure Laterality Date  . ANTERIOR CRUCIATE LIGAMENT REPAIR Right   . BREAST BIOPSY Right 07/17/2015   INVASIVE LOBULAR CARCINOMA, CLASSIC TYPE (1.5 MM), ARISING IN A   . BREAST BIOPSY Left 09/19/2020   [place] bx 3 areas/ 1oc q clip/ 12 oc vision clip, axilla lymph node hydromark shape 3/ path pending  . BREAST LUMPECTOMY Right 08/22/2015  . BREAST LUMPECTOMY WITH SENTINEL LYMPH NODE BIOPSY Right 08/22/2015   Procedure: BREAST LUMPECTOMY WITH SENTINEL LYMPH NODE BX;  Surgeon: Christene Lye, MD;  Location: [facility];  Service: General;  Laterality: Right;  . COLONOSCOPY W/ POLYPECTOMY  2014  . COLONOSCOPY WITH PROPOFOL N/A 04/16/2019   Procedure: COLONOSCOPY WITH BIOPSIES;  Surgeon: Lucilla Lame, MD;  Location: [place];  Service: Endoscopy;  Laterality: N/A;  . DILATION AND CURETTAGE OF UTERUS     20 years ago  . NOVASURE ABLATION    . POLYPECTOMY N/A 04/16/2019   Procedure: POLYPECTOMY;  Surgeon: Lucilla Lame, MD;  Location: [place];  Service: Endoscopy;  Laterality: N/A;  . PORTACATH PLACEMENT Right 12/01/2020   Procedure: INSERTION PORT-A-CATH;  Surgeon: [name], MD;  Location: [facility];  Service: General;  Laterality: Right;    Social History   Socioeconomic History  . Marital status: Married    Spouse name: Not on file   . Number of children: Not on file  . Years of education: Not on file  . Highest education level: Not on file  Occupational History  . Not on file  Tobacco Use  . Smoking status: Current Every Day Smoker    Packs/day: 0.50    Years: 40.00    Pack years: 20.00    Types: Cigarettes  . Smokeless tobacco: Never Used  Vaping Use  . Vaping Use: Never used  Substance and Sexual Activity  . Alcohol use: Yes    Alcohol/week: 2.0 standard drinks    Types: 2 Standard drinks or equivalent per week    Comment: BEER 1-2 QD  . Drug use: No  . Sexual activity: Yes    Comment: ablation  Other Topics Concern  . Not on file  Social History Narrative  . Not on file   Social Determinants of Health   Financial Resource Strain: Not on file  Food Insecurity: Not on file  Transportation Needs: Not on file  Physical Activity: Not on file  Stress: Not on file  Social Connections: Not on file  Intimate Partner Violence: Not on file    Family History  Problem Relation Age of Onset  . Stroke Mother   . Cancer Sister        sarcoma on arm;  chemo  . Diabetes Sister   . Stroke Sister   . Diabetes Brother   . Breast cancer Neg Hx   . Ovarian cancer Neg Hx   . Colon cancer Neg Hx   . Heart disease Neg Hx     Current Outpatient Medications:  .  allopurinol (ZYLOPRIM) 100 MG tablet, Take 100 mg by mouth at bedtime., Disp: , Rfl:  .  amLODipine (NORVASC) 10 MG tablet, Take 10 mg by mouth at bedtime. , Disp: , Rfl: 0 .  Calcium Carb-Cholecalciferol (CALCIUM 600+D3 PO), Take 1 tablet by mouth at bedtime., Disp: , Rfl:  .  dexamethasone (DECADRON) 4 MG tablet, Take 2 tablets (8 mg total) by mouth daily. Take daily for 3 days after chemo. Take with food., Disp: 30 tablet, Rfl: 1 .  HYDROcodone-acetaminophen (NORCO/VICODIN) 5-325 MG tablet, Take 1 tablet by mouth every 6 (six) hours as needed for moderate pain., Disp: 15 tablet, Rfl: 0 .  ibuprofen (ADVIL) 800 MG tablet, Take 1 tablet (800 mg total)  by mouth every 8 (eight) hours as needed., Disp: 30 tablet, Rfl: 0 .  lidocaine-prilocaine (EMLA) cream, Apply to affected area once, Disp: 30 g, Rfl: 3 .  losartan (COZAAR) 100 MG tablet, Take 100 mg by mouth at bedtime. , Disp: , Rfl: 0 .  metoprolol succinate (TOPROL-XL) 25 MG 24 hr tablet, Take 50 mg by mouth at bedtime., Disp: , Rfl: 0 .  potassium chloride SA (KLOR-CON) 20 MEQ tablet, Take 1 tablet (20 mEq total) by mouth daily., Disp: 7 tablet, Rfl: 0 .  PROVENTIL HFA 108 (90 Base) MCG/ACT inhaler, Inhale 2 puffs into the lungs every 6 (six) hours as needed (wheezing/shortness of breath.)., Disp: , Rfl:  .  LORazepam (ATIVAN) 0.5 MG tablet, Take 1 tablet (0.5 mg total) by mouth every 6 (six) hours as needed (Nausea or vomiting). (Patient not taking: Reported on 01/01/2021), Disp: 30 tablet, Rfl: 0 .  ondansetron (ZOFRAN) 8 MG tablet, Take 1 tablet (8 mg total) by mouth 2 (two) times daily as needed. Start on the third day after chemotherapy. (Patient not taking: No sig reported), Disp: 30 tablet, Rfl: 1 .  prochlorperazine (COMPAZINE) 10 MG tablet, Take 1 tablet (10 mg total) by mouth every 6 (six) hours as needed (Nausea or vomiting). (Patient not taking: No sig reported), Disp: 30 tablet, Rfl: 1  Physical exam:  Vitals:   01/01/21 0852  BP: (!) 144/89  Pulse: 84  Resp: 18  Temp: 98 F (36.7 C)  TempSrc: Tympanic  SpO2: 100%  Weight: 190 lb (86.2 kg)   Physical Exam Constitutional:      Appearance: Normal appearance.  HENT:     Head: Normocephalic and atraumatic.  Eyes:     General: No scleral icterus.    Conjunctiva/sclera: Conjunctivae normal.  Cardiovascular:     Rate and Rhythm: Normal rate and regular rhythm.     Heart sounds: Normal heart sounds.  Pulmonary:     Effort: Pulmonary effort is normal.     Breath sounds: Normal breath sounds.  Abdominal:     General: Bowel sounds are normal.     Palpations: Abdomen is soft.  Musculoskeletal:     Cervical back: Normal  range of motion.  Skin:    General: Skin is warm and dry.  Neurological:     Mental Status: She is alert and oriented to person, place, and time.  Psychiatric:        Mood and Affect: Mood normal.  Behavior: Behavior normal.    Breast exam: deferred   CMP Latest Ref Rng & Units 01/01/2021  Glucose 70 - 99 mg/dL 99  BUN 6 - 20 mg/dL 11  Creatinine 0.44 - 1.00 mg/dL 0.72  Sodium 135 - 145 mmol/L 140  Potassium 3.5 - 5.1 mmol/L 3.3(L)  Chloride 98 - 111 mmol/L 106  CO2 22 - 32 mmol/L 23  Calcium 8.9 - 10.3 mg/dL 9.4  Total Protein 6.5 - 8.1 g/dL 6.5  Total Bilirubin 0.3 - 1.2 mg/dL 0.3  Alkaline Phos 38 - 126 U/L 85  AST 15 - 41 U/L 17  ALT 0 - 44 U/L 13   CBC Latest Ref Rng & Units 01/01/2021  WBC 4.0 - 10.5 K/uL 7.6  Hemoglobin 12.0 - 15.0 g/dL 11.9(L)  Hematocrit 36.0 - 46.0 % 36.9  Platelets 150 - 400 K/uL 174    No images are attached to the encounter.  No results found.   Assessment and plan- Patient is a 60 y.o. female with prior history of right breast cancer status post lumpectomy, now with anatomical stage IIa left breast invasive mammary carcinoma [id number] ER/PR positive HER-2/neu negative, who presents for treatment assessment prior to cycle 3 of neoadjuvant dose dense AC chemotherapy.   Tolerating treatment well without significant side effects.  Counts reviewed and acceptable to proceed with cycle 3 of dose dense AC chemotherapy with Neulasta support. Due to skin reaction to adhesive will change onpro to [place] injection.   Continue oral potassium 20 mEq daily. K 3.3 today and no obvious GI losses.  Follow-up with Dr. Janese Banks as scheduled for reevaluation and consideration of cycle 4 of dose dense AC chemotherapy with Neulasta support on 01/15/2021.   Visit Diagnosis 1. Encounter for antineoplastic chemotherapy   2. Malignant neoplasm of overlapping sites of left breast in female, estrogen receptor positive ([place])   3. Hypokalemia    Beckey Rutter, DNP,  AGNP-C [place] [facility] [phone] (clinic)

## 2021-01-01 NOTE — Progress Notes (Signed)
Pt received AC tx in clinic today. Tolerated well. No complaints at d/c.

## 2021-01-02 ENCOUNTER — Inpatient Hospital Stay: Payer: 59

## 2021-01-02 DIAGNOSIS — C50411 Malignant neoplasm of upper-outer quadrant of right female breast: Secondary | ICD-10-CM

## 2021-01-02 DIAGNOSIS — Z5111 Encounter for antineoplastic chemotherapy: Secondary | ICD-10-CM | POA: Diagnosis not present

## 2021-01-02 DIAGNOSIS — Z170 Estrogen receptor positive status [ER+]: Secondary | ICD-10-CM

## 2021-01-02 MED ORDER — PEGFILGRASTIM INJECTION 6 MG/0.6ML ~~LOC~~
6.0000 mg | PREFILLED_SYRINGE | Freq: Once | SUBCUTANEOUS | Status: AC
  Administered 2021-01-02: 6 mg via SUBCUTANEOUS
  Filled 2021-01-02: qty 0.6

## 2021-01-08 NOTE — Progress Notes (Signed)
D1D2 cycle 4 was completed.  Had to reenter cycle 4 as D3/D4 for orders.

## 2021-01-15 ENCOUNTER — Other Ambulatory Visit: Payer: Self-pay

## 2021-01-15 ENCOUNTER — Inpatient Hospital Stay: Payer: 59

## 2021-01-15 ENCOUNTER — Inpatient Hospital Stay (HOSPITAL_BASED_OUTPATIENT_CLINIC_OR_DEPARTMENT_OTHER): Payer: 59 | Admitting: Oncology

## 2021-01-15 ENCOUNTER — Encounter: Payer: Self-pay | Admitting: Oncology

## 2021-01-15 VITALS — BP 147/95 | HR 98 | Temp 96.9°F | Resp 20 | Wt 187.1 lb

## 2021-01-15 DIAGNOSIS — Z5111 Encounter for antineoplastic chemotherapy: Secondary | ICD-10-CM

## 2021-01-15 DIAGNOSIS — Z170 Estrogen receptor positive status [ER+]: Secondary | ICD-10-CM

## 2021-01-15 DIAGNOSIS — C50812 Malignant neoplasm of overlapping sites of left female breast: Secondary | ICD-10-CM

## 2021-01-15 DIAGNOSIS — E876 Hypokalemia: Secondary | ICD-10-CM | POA: Diagnosis not present

## 2021-01-15 LAB — COMPREHENSIVE METABOLIC PANEL
ALT: 9 U/L (ref 0–44)
AST: 16 U/L (ref 15–41)
Albumin: 3.7 g/dL (ref 3.5–5.0)
Alkaline Phosphatase: 76 U/L (ref 38–126)
Anion gap: 8 (ref 5–15)
BUN: 10 mg/dL (ref 6–20)
CO2: 23 mmol/L (ref 22–32)
Calcium: 9.2 mg/dL (ref 8.9–10.3)
Chloride: 107 mmol/L (ref 98–111)
Creatinine, Ser: 0.75 mg/dL (ref 0.44–1.00)
GFR, Estimated: >60 mL/min (ref >60)
Glucose, Bld: 119 mg/dL — ABNORMAL HIGH (ref 70–99)
Potassium: 3.1 mmol/L — ABNORMAL LOW (ref 3.5–5.1)
Sodium: 138 mmol/L (ref 135–145)
Total Bilirubin: 0.4 mg/dL (ref 0.3–1.2)
Total Protein: 7.2 g/dL (ref 6.5–8.1)

## 2021-01-15 LAB — CBC WITH DIFFERENTIAL/PLATELET
Abs Immature Granulocytes: 0.42 10*3/uL — ABNORMAL HIGH (ref 0.00–0.07)
Basophils Absolute: 0.1 10*3/uL (ref 0.0–0.1)
Basophils Relative: 1 %
Eosinophils Absolute: 0 10*3/uL (ref 0.0–0.5)
Eosinophils Relative: 0 %
HCT: 37 % (ref 36.0–46.0)
Hemoglobin: 11.7 g/dL — ABNORMAL LOW (ref 12.0–15.0)
Immature Granulocytes: 6 %
Lymphocytes Relative: 14 %
Lymphs Abs: 1 10*3/uL (ref 0.7–4.0)
MCH: 27 pg (ref 26.0–34.0)
MCHC: 31.6 g/dL (ref 30.0–36.0)
MCV: 85.5 fL (ref 80.0–100.0)
Monocytes Absolute: 0.6 10*3/uL (ref 0.1–1.0)
Monocytes Relative: 8 %
Neutro Abs: 5 10*3/uL (ref 1.7–7.7)
Neutrophils Relative %: 71 %
Platelets: 205 10*3/uL (ref 150–400)
RBC: 4.33 MIL/uL (ref 3.87–5.11)
RDW: 14.7 % (ref 11.5–15.5)
WBC: 7.1 10*3/uL (ref 4.0–10.5)
nRBC: 0 % (ref 0.0–0.2)

## 2021-01-15 MED ORDER — HEPARIN SOD (PORK) LOCK FLUSH 100 UNIT/ML IV SOLN
500.0000 [IU] | Freq: Once | INTRAVENOUS | Status: AC
  Administered 2021-01-15: 500 [IU] via INTRAVENOUS
  Filled 2021-01-15: qty 5

## 2021-01-15 MED ORDER — SODIUM CHLORIDE 0.9% FLUSH
10.0000 mL | Freq: Once | INTRAVENOUS | Status: AC
  Administered 2021-01-15: 10 mL via INTRAVENOUS
  Filled 2021-01-15: qty 10

## 2021-01-15 MED ORDER — HEPARIN SOD (PORK) LOCK FLUSH 100 UNIT/ML IV SOLN
INTRAVENOUS | Status: AC
  Filled 2021-01-15: qty 5

## 2021-01-15 MED ORDER — HEPARIN SOD (PORK) LOCK FLUSH 100 UNIT/ML IV SOLN
500.0000 [IU] | Freq: Once | INTRAVENOUS | Status: DC | PRN
  Filled 2021-01-15: qty 5

## 2021-01-15 MED ORDER — SODIUM CHLORIDE 0.9 % IV SOLN
10.0000 mg | Freq: Once | INTRAVENOUS | Status: AC
  Administered 2021-01-15: 10 mg via INTRAVENOUS
  Filled 2021-01-15: qty 10

## 2021-01-15 MED ORDER — PALONOSETRON HCL INJECTION 0.25 MG/5ML
0.2500 mg | Freq: Once | INTRAVENOUS | Status: AC
  Administered 2021-01-15: 0.25 mg via INTRAVENOUS
  Filled 2021-01-15: qty 5

## 2021-01-15 MED ORDER — SODIUM CHLORIDE 0.9 % IV SOLN
Freq: Once | INTRAVENOUS | Status: AC
  Filled 2021-01-15: qty 250

## 2021-01-15 MED ORDER — SODIUM CHLORIDE 0.9 % IV SOLN
150.0000 mg | Freq: Once | INTRAVENOUS | Status: AC
  Administered 2021-01-15: 150 mg via INTRAVENOUS
  Filled 2021-01-15: qty 150

## 2021-01-15 MED ORDER — CYCLOPHOSPHAMIDE CHEMO INJECTION 1 GM
600.0000 mg/m2 | Freq: Once | INTRAMUSCULAR | Status: AC
  Administered 2021-01-15: 1200 mg via INTRAVENOUS
  Filled 2021-01-15: qty 50

## 2021-01-15 MED ORDER — DOXORUBICIN HCL CHEMO IV INJECTION 2 MG/ML
60.0000 mg/m2 | Freq: Once | INTRAVENOUS | Status: AC
  Administered 2021-01-15: 120 mg via INTRAVENOUS
  Filled 2021-01-15: qty 50

## 2021-01-15 NOTE — Progress Notes (Signed)
Hematology/Oncology Consult note [facility]  [phone] [phone]  Patient Care Team: Casilda Carls, MD as PCP - General (Internal Medicine) Casilda Carls, MD as Consulting Physician (Internal Medicine) Christene Lye, MD (General Surgery) Garrel Ridgel, [place] as Consulting Physician (Podiatry) Sindy Guadeloupe, MD as Consulting Physician (Hematology and Oncology)   Name of the patient: Michelle Walker  [id number]  01/28/1961   Date of visit: 01/15/21  Diagnosis- recurrent left breast cancer stage II [id number] ER/PR positive HER-2 negative  Chief complaint/ Reason for visit-on treatment assessment prior to cycle 4 of dose dense AC chemotherapy  Heme/Onc history: Patient is a 60 year old female who was diagnosed with 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 She did not require adjuvant chemotherapy. She was initially on tamoxifen which she could not tolerate and was switched to letrozole. Last seen by me in June 2019 at which point she had a menstrual cycle and therefore not given another AI. She had subsequently did not follow-up with me and remained off hormone therapy.   More recently patient underwent a diagnostic bilateral mammogram which showed a 2.2 x 1.9 x 1.9 cm mass at the 12 o'clock position of the left breast 1 cm from the nipple. She was also found to have 5 other smaller breast masses ranging from 0.3 to 0.8 cm. Noted to have a solitary left axillary lymph node with cortical thickening. She had a biopsy of the dominant left breast mass as well as another smaller breast mass and the left axillary lymph node all of which were positive for metastatic invasive mammary carcinoma grade 2. Tumor was ER 91 200% positive PR 71 to 80% positive and HER-2 IHC equivocalbut negative by FISH  MRI of the bilateral breasts showed no abnormal findings in  the right breast. At least 6 discrete hypoechoic masses in the left breast with the largest one measuring 2.2 cm with 1 abnormal left axillary lymph node. The size of the other breast masses ranging from 84m to 1.4 cm. The total area of masses and non-mass enhancement was about 10 x 5 x 6.5 cm.CT chest abdomen and pelvis with contrast showed no evidence of distant metastatic disease. Bone scan was also negative.  MammaPrint done on the biopsy specimen came back as high risk with greater than 12% chemotherapy benefit.Plan is for neoadjuvant dose dense AC-Taxol chemotherapy followed by surgery.Start of chemotherapy was delayed since patient tested positive for Covid   Interval history-patient has dark discoloration of both her hands and dryness as well.  Reports mild fatigue but she is able to continue to go to work.  Denies other complaints at this time  ECOG PS- 1 Pain scale- 0 Opioid associated constipation- no  Review of systems- Review of Systems  Constitutional: Positive for malaise/fatigue. Negative for chills, fever and weight loss.  HENT: Negative for congestion, ear discharge and nosebleeds.   Eyes: Negative for blurred vision.  Respiratory: Negative for cough, hemoptysis, sputum production, shortness of breath and wheezing.   Cardiovascular: Negative for chest pain, palpitations, orthopnea and claudication.  Gastrointestinal: Negative for abdominal pain, blood in stool, constipation, diarrhea, heartburn, melena, nausea and vomiting.  Genitourinary: Negative for dysuria, flank pain, frequency, hematuria and urgency.  Musculoskeletal: Negative for back pain, joint pain and myalgias.  Skin: Negative for rash.       Dark discoloration of bilateral hands  Neurological: Negative for dizziness, tingling, focal weakness, seizures,  weakness and headaches.  Endo/Heme/Allergies: Does not bruise/bleed easily.  Psychiatric/Behavioral: Negative for depression and suicidal ideas. The  patient does not have insomnia.        Allergies  Allergen Reactions  . Triamcinolone Other (See Comments)    Hypopigmentation s/p steroid injection  . Tape Rash    SURGICAL TAPE AFTER BREAST BIOPSY     Past Medical History:  Diagnosis Date  . Anemia   . Arthritis   . 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 Wide excision/ RT, T1a,N0. ER/PR pos, Her 2.neg. Tamoxifen x 6 months, d/c secondary to vasomotor sympotms.   . Headache   . Hypertension   . Personal history of radiation therapy 2016   RIGHT lumpectomy  . Thyroid disease      Past Surgical History:  Procedure Laterality Date  . ANTERIOR CRUCIATE LIGAMENT REPAIR Right   . BREAST BIOPSY Right 07/17/2015   INVASIVE LOBULAR CARCINOMA, CLASSIC TYPE (1.5 MM), ARISING IN A   . BREAST BIOPSY Left 09/19/2020   [place] bx 3 areas/ 1oc q clip/ 12 oc vision clip, axilla lymph node hydromark shape 3/ path pending  . BREAST LUMPECTOMY Right 08/22/2015  . BREAST LUMPECTOMY WITH SENTINEL LYMPH NODE BIOPSY Right 08/22/2015   Procedure: BREAST LUMPECTOMY WITH SENTINEL LYMPH NODE BX;  Surgeon: Christene Lye, MD;  Location: [facility];  Service: General;  Laterality: Right;  . COLONOSCOPY W/ POLYPECTOMY  2014  . COLONOSCOPY WITH PROPOFOL N/A 04/16/2019   Procedure: COLONOSCOPY WITH BIOPSIES;  Surgeon: Lucilla Lame, MD;  Location: [place];  Service: Endoscopy;  Laterality: N/A;  . DILATION AND CURETTAGE OF UTERUS     20 years ago  . NOVASURE ABLATION    . POLYPECTOMY N/A 04/16/2019   Procedure: POLYPECTOMY;  Surgeon: Lucilla Lame, MD;  Location: [place];  Service: Endoscopy;  Laterality: N/A;  . PORTACATH PLACEMENT Right 12/01/2020   Procedure: INSERTION PORT-A-CATH;  Surgeon: [name], MD;  Location: [facility];  Service: General;  Laterality: Right;    Social History   Socioeconomic History  . Marital status: Married    Spouse name: Not on file  . Number of  children: Not on file  . Years of education: Not on file  . Highest education level: Not on file  Occupational History  . Not on file  Tobacco Use  . Smoking status: Current Every Day Smoker    Packs/day: 0.50    Years: 40.00    Pack years: 20.00    Types: Cigarettes  . Smokeless tobacco: Never Used  Vaping Use  . Vaping Use: Never used  Substance and Sexual Activity  . Alcohol use: Yes    Alcohol/week: 2.0 standard drinks    Types: 2 Standard drinks or equivalent per week    Comment: BEER 1-2 QD  . Drug use: No  . Sexual activity: Yes    Comment: ablation  Other Topics Concern  . Not on file  Social History Narrative  . Not on file   Social Determinants of Health   Financial Resource Strain: Not on file  Food Insecurity: Not on file  Transportation Needs: Not on file  Physical Activity: Not on file  Stress: Not on file  Social Connections: Not on file  Intimate Partner Violence: Not on file    Family History  Problem Relation Age of Onset  . Stroke Mother   . Cancer Sister        sarcoma  on arm; chemo  . Diabetes Sister   . Stroke Sister   . Diabetes Brother   . Breast cancer Neg Hx   . Ovarian cancer Neg Hx   . Colon cancer Neg Hx   . Heart disease Neg Hx      Current Outpatient Medications:  .  allopurinol (ZYLOPRIM) 100 MG tablet, Take 100 mg by mouth at bedtime., Disp: , Rfl:  .  amLODipine (NORVASC) 10 MG tablet, Take 10 mg by mouth at bedtime. , Disp: , Rfl: 0 .  Calcium Carb-Cholecalciferol (CALCIUM 600+D3 PO), Take 1 tablet by mouth at bedtime., Disp: , Rfl:  .  dexamethasone (DECADRON) 4 MG tablet, Take 2 tablets (8 mg total) by mouth daily. Take daily for 3 days after chemo. Take with food., Disp: 30 tablet, Rfl: 1 .  lidocaine-prilocaine (EMLA) cream, Apply to affected area once, Disp: 30 g, Rfl: 3 .  losartan (COZAAR) 100 MG tablet, Take 100 mg by mouth at bedtime. , Disp: , Rfl: 0 .  metoprolol succinate (TOPROL-XL) 25 MG 24 hr tablet, Take 50  mg by mouth at bedtime., Disp: , Rfl: 0 .  potassium chloride SA (KLOR-CON) 20 MEQ tablet, Take 1 tablet (20 mEq total) by mouth daily., Disp: 30 tablet, Rfl: 0 .  PROVENTIL HFA 108 (90 Base) MCG/ACT inhaler, Inhale 2 puffs into the lungs every 6 (six) hours as needed (wheezing/shortness of breath.)., Disp: , Rfl:  .  HYDROcodone-acetaminophen (NORCO/VICODIN) 5-325 MG tablet, Take 1 tablet by mouth every 6 (six) hours as needed for moderate pain. (Patient not taking: Reported on 01/15/2021), Disp: 15 tablet, Rfl: 0 .  ibuprofen (ADVIL) 800 MG tablet, Take 1 tablet (800 mg total) by mouth every 8 (eight) hours as needed. (Patient not taking: Reported on 01/15/2021), Disp: 30 tablet, Rfl: 0 .  LORazepam (ATIVAN) 0.5 MG tablet, Take 1 tablet (0.5 mg total) by mouth every 6 (six) hours as needed (Nausea or vomiting). (Patient not taking: No sig reported), Disp: 30 tablet, Rfl: 0 .  ondansetron (ZOFRAN) 8 MG tablet, Take 1 tablet (8 mg total) by mouth 2 (two) times daily as needed. Start on the third day after chemotherapy. (Patient not taking: No sig reported), Disp: 30 tablet, Rfl: 1 .  prochlorperazine (COMPAZINE) 10 MG tablet, Take 1 tablet (10 mg total) by mouth every 6 (six) hours as needed (Nausea or vomiting). (Patient not taking: No sig reported), Disp: 30 tablet, Rfl: 1 No current facility-administered medications for this visit.  Facility-Administered Medications Ordered in Other Visits:  .  heparin lock flush 100 unit/mL, 500 Units, Intracatheter, Once PRN, Sindy Guadeloupe, MD  Physical exam:  Vitals:   01/15/21 0856  BP: (!) 147/95  Pulse: 98  Resp: 20  Temp: (!) 96.9 F (36.1 C)  TempSrc: Tympanic  SpO2: 100%  Weight: 187 lb 1.6 oz (84.9 kg)   Physical Exam Constitutional:      General: She is not in acute distress. Cardiovascular:     Rate and Rhythm: Normal rate and regular rhythm.     Heart sounds: Normal heart sounds.  Pulmonary:     Effort: Pulmonary effort is normal.      Breath sounds: Normal breath sounds.  Abdominal:     General: Bowel sounds are normal.     Palpations: Abdomen is soft.  Skin:    General: Skin is warm and dry.  Neurological:     Mental Status: She is alert and oriented to person, place, and time.  Breast exam: Left breast mass vaguely palpable. No palpable left axillary adenopathy.  CMP Latest Ref Rng & Units 01/15/2021  Glucose 70 - 99 mg/dL 119(H)  BUN 6 - 20 mg/dL 10  Creatinine 0.44 - 1.00 mg/dL 0.75  Sodium 135 - 145 mmol/L 138  Potassium 3.5 - 5.1 mmol/L 3.1(L)  Chloride 98 - 111 mmol/L 107  CO2 22 - 32 mmol/L 23  Calcium 8.9 - 10.3 mg/dL 9.2  Total Protein 6.5 - 8.1 g/dL 7.2  Total Bilirubin 0.3 - 1.2 mg/dL 0.4  Alkaline Phos 38 - 126 U/L 76  AST 15 - 41 U/L 16  ALT 0 - 44 U/L 9   CBC Latest Ref Rng & Units 01/15/2021  WBC 4.0 - 10.5 K/uL 7.1  Hemoglobin 12.0 - 15.0 g/dL 11.7(L)  Hematocrit 36.0 - 46.0 % 37.0  Platelets 150 - 400 K/uL 205      Assessment and plan- Patient is a 60 y.o. female with prior history of right breast cancer s/p lumpectomy now with anatomical stage IIAleft breast invasive mammary carcinoma [id number] ER/PR positive and HER-2 negative.  She is here for on treatment assessment prior to cycle 4 of dose dense AC chemotherapy  Counts okay to proceed with cycle 4 of dose dense AC chemotherapy today and she will receive growth factor tomorrow.  I will check interim ultrasound of the left breast in 1 week.  I will see her back in 2 weeks to start weekly Taxol chemotherapy.  Patient has tolerated chemotherapy well for the most part and continues to work.  She has some discoloration of her bilateral hands likely secondary to Adriamycin which we will continue to monitor.  Hypokalemia: Patient has not been taking her potassium every day and have encouraged her to do so we will reassess in 2 weeks   Visit Diagnosis 1. Malignant neoplasm of overlapping sites of left breast in female, estrogen receptor  positive ([place])   2. Encounter for antineoplastic chemotherapy   3. Hypokalemia      Dr. [name], MD, MPH [facility] [phone] 01/15/2021 12:24 PM

## 2021-01-16 ENCOUNTER — Inpatient Hospital Stay: Payer: 59

## 2021-01-16 ENCOUNTER — Other Ambulatory Visit: Payer: Self-pay

## 2021-01-16 ENCOUNTER — Other Ambulatory Visit: Payer: Self-pay | Admitting: Oncology

## 2021-01-16 DIAGNOSIS — Z5111 Encounter for antineoplastic chemotherapy: Secondary | ICD-10-CM | POA: Diagnosis not present

## 2021-01-16 DIAGNOSIS — Z170 Estrogen receptor positive status [ER+]: Secondary | ICD-10-CM

## 2021-01-16 DIAGNOSIS — Z7689 Persons encountering health services in other specified circumstances: Secondary | ICD-10-CM | POA: Insufficient documentation

## 2021-01-16 DIAGNOSIS — C50812 Malignant neoplasm of overlapping sites of left female breast: Secondary | ICD-10-CM

## 2021-01-16 MED ORDER — PEGFILGRASTIM INJECTION 6 MG/0.6ML ~~LOC~~
6.0000 mg | PREFILLED_SYRINGE | Freq: Once | SUBCUTANEOUS | Status: AC
  Administered 2021-01-16: 6 mg via SUBCUTANEOUS
  Filled 2021-01-16: qty 0.6

## 2021-01-20 ENCOUNTER — Other Ambulatory Visit: Payer: Self-pay

## 2021-01-20 ENCOUNTER — Ambulatory Visit
Admission: RE | Admit: 2021-01-20 | Discharge: 2021-01-20 | Disposition: A | Discharge status: Home / Self Care | Payer: 59 | Source: Ambulatory Visit | Attending: Oncology | Admitting: Oncology

## 2021-01-20 DIAGNOSIS — Z170 Estrogen receptor positive status [ER+]: Secondary | ICD-10-CM | POA: Diagnosis present

## 2021-01-20 DIAGNOSIS — C50812 Malignant neoplasm of overlapping sites of left female breast: Secondary | ICD-10-CM | POA: Insufficient documentation

## 2021-01-29 ENCOUNTER — Other Ambulatory Visit: Payer: Self-pay

## 2021-01-29 ENCOUNTER — Inpatient Hospital Stay (HOSPITAL_BASED_OUTPATIENT_CLINIC_OR_DEPARTMENT_OTHER): Payer: 59 | Admitting: Oncology

## 2021-01-29 ENCOUNTER — Encounter: Payer: Self-pay | Admitting: Oncology

## 2021-01-29 ENCOUNTER — Inpatient Hospital Stay: Payer: 59

## 2021-01-29 VITALS — BP 161/93 | HR 122 | Temp 98.0°F | Resp 20 | Wt 189.9 lb

## 2021-01-29 VITALS — BP 148/86 | HR 89 | Resp 18

## 2021-01-29 DIAGNOSIS — M109 Gout, unspecified: Secondary | ICD-10-CM

## 2021-01-29 DIAGNOSIS — C50411 Malignant neoplasm of upper-outer quadrant of right female breast: Secondary | ICD-10-CM | POA: Diagnosis not present

## 2021-01-29 DIAGNOSIS — Z170 Estrogen receptor positive status [ER+]: Secondary | ICD-10-CM

## 2021-01-29 DIAGNOSIS — C50812 Malignant neoplasm of overlapping sites of left female breast: Secondary | ICD-10-CM

## 2021-01-29 DIAGNOSIS — E559 Vitamin D deficiency, unspecified: Secondary | ICD-10-CM | POA: Diagnosis not present

## 2021-01-29 DIAGNOSIS — E785 Hyperlipidemia, unspecified: Secondary | ICD-10-CM

## 2021-01-29 DIAGNOSIS — Z5111 Encounter for antineoplastic chemotherapy: Secondary | ICD-10-CM

## 2021-01-29 LAB — TSH: TSH: 0.014 u[IU]/mL — ABNORMAL LOW (ref 0.350–4.500)

## 2021-01-29 LAB — CBC WITH DIFFERENTIAL/PLATELET
Abs Immature Granulocytes: 0.55 10*3/uL — ABNORMAL HIGH (ref 0.00–0.07)
Basophils Absolute: 0.1 10*3/uL (ref 0.0–0.1)
Basophils Relative: 1 %
Eosinophils Absolute: 0 10*3/uL (ref 0.0–0.5)
Eosinophils Relative: 0 %
HCT: 32.9 % — ABNORMAL LOW (ref 36.0–46.0)
Hemoglobin: 10.5 g/dL — ABNORMAL LOW (ref 12.0–15.0)
Immature Granulocytes: 6 %
Lymphocytes Relative: 9 %
Lymphs Abs: 0.8 10*3/uL (ref 0.7–4.0)
MCH: 27.5 pg (ref 26.0–34.0)
MCHC: 31.9 g/dL (ref 30.0–36.0)
MCV: 86.1 fL (ref 80.0–100.0)
Monocytes Absolute: 1 10*3/uL (ref 0.1–1.0)
Monocytes Relative: 11 %
Neutro Abs: 6.3 10*3/uL (ref 1.7–7.7)
Neutrophils Relative %: 73 %
Platelets: 216 10*3/uL (ref 150–400)
RBC: 3.82 MIL/uL — ABNORMAL LOW (ref 3.87–5.11)
RDW: 16.4 % — ABNORMAL HIGH (ref 11.5–15.5)
WBC: 8.7 10*3/uL (ref 4.0–10.5)
nRBC: 0.6 % — ABNORMAL HIGH (ref 0.0–0.2)

## 2021-01-29 LAB — COMPREHENSIVE METABOLIC PANEL
ALT: 9 U/L (ref 0–44)
AST: 14 U/L — ABNORMAL LOW (ref 15–41)
Albumin: 3.5 g/dL (ref 3.5–5.0)
Alkaline Phosphatase: 82 U/L (ref 38–126)
Anion gap: 10 (ref 5–15)
BUN: 12 mg/dL (ref 6–20)
CO2: 22 mmol/L (ref 22–32)
Calcium: 9.1 mg/dL (ref 8.9–10.3)
Chloride: 107 mmol/L (ref 98–111)
Creatinine, Ser: 0.64 mg/dL (ref 0.44–1.00)
GFR, Estimated: >60 mL/min (ref >60)
Glucose, Bld: 96 mg/dL (ref 70–99)
Potassium: 3.5 mmol/L (ref 3.5–5.1)
Sodium: 139 mmol/L (ref 135–145)
Total Bilirubin: 0.3 mg/dL (ref 0.3–1.2)
Total Protein: 6.6 g/dL (ref 6.5–8.1)

## 2021-01-29 LAB — T4, FREE: Free T4: 0.94 ng/dL (ref 0.61–1.12)

## 2021-01-29 LAB — LIPID PANEL
Cholesterol: 161 mg/dL (ref 0–200)
HDL: 42 mg/dL (ref >40)
LDL Cholesterol: 95 mg/dL (ref 0–99)
Total CHOL/HDL Ratio: 3.8 RATIO
Triglycerides: 119 mg/dL (ref <150)
VLDL: 24 mg/dL (ref 0–40)

## 2021-01-29 MED ORDER — SODIUM CHLORIDE 0.9 % IV SOLN
65.0000 mg/m2 | Freq: Once | INTRAVENOUS | Status: AC
  Administered 2021-01-29: 132 mg via INTRAVENOUS
  Filled 2021-01-29: qty 22

## 2021-01-29 MED ORDER — HEPARIN SOD (PORK) LOCK FLUSH 100 UNIT/ML IV SOLN
500.0000 [IU] | Freq: Once | INTRAVENOUS | Status: AC | PRN
  Administered 2021-01-29: 500 [IU]
  Filled 2021-01-29: qty 5

## 2021-01-29 MED ORDER — HEPARIN SOD (PORK) LOCK FLUSH 100 UNIT/ML IV SOLN
INTRAVENOUS | Status: AC
  Filled 2021-01-29: qty 5

## 2021-01-29 MED ORDER — SODIUM CHLORIDE 0.9 % IV SOLN
20.0000 mg | Freq: Once | INTRAVENOUS | Status: AC
  Administered 2021-01-29: 20 mg via INTRAVENOUS
  Filled 2021-01-29: qty 20

## 2021-01-29 MED ORDER — FAMOTIDINE 20 MG IN NS 100 ML IVPB
20.0000 mg | Freq: Once | INTRAVENOUS | Status: AC
  Administered 2021-01-29: 20 mg via INTRAVENOUS
  Filled 2021-01-29: qty 20
  Filled 2021-01-29: qty 100

## 2021-01-29 MED ORDER — DIPHENHYDRAMINE HCL 50 MG/ML IJ SOLN
50.0000 mg | Freq: Once | INTRAMUSCULAR | Status: AC
  Administered 2021-01-29: 50 mg via INTRAVENOUS
  Filled 2021-01-29: qty 1

## 2021-01-29 MED ORDER — SODIUM CHLORIDE 0.9 % IV SOLN
Freq: Once | INTRAVENOUS | Status: AC
  Filled 2021-01-29: qty 250

## 2021-01-29 NOTE — Progress Notes (Signed)
Hematology/Oncology Consult note [facility]  [phone] [phone]  Patient Care Team: Casilda Carls, MD as PCP - General (Internal Medicine) Casilda Carls, MD as Consulting Physician (Internal Medicine) Christene Lye, MD (General Surgery) Garrel Ridgel, [place] as Consulting Physician (Podiatry) Sindy Guadeloupe, MD as Consulting Physician (Hematology and Oncology)   Name of the patient: Michelle Walker  [id number]  November 12, 1960   Date of visit: 01/29/21  Diagnosis- recurrent left breast cancer stage II [id number] ER/PR positive HER-2 negative  Chief complaint/ Reason for visit-on treatment assessment prior to cycle 1 of weekly Taxol chemotherapy  Heme/Onc history: Patient is a 60 year old female who was diagnosed with 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 She did not require adjuvant chemotherapy. She was initially on tamoxifen which she could not tolerate and was switched to letrozole. Last seen by me in June 2019 at which point she had a menstrual cycle and therefore not given another AI. She had subsequently did not follow-up with me and remained off hormone therapy.   More recently patient underwent a diagnostic bilateral mammogram which showed a 2.2 x 1.9 x 1.9 cm mass at the 12 o'clock position of the left breast 1 cm from the nipple. She was also found to have 5 other smaller breast masses ranging from 0.3 to 0.8 cm. Noted to have a solitary left axillary lymph node with cortical thickening. She had a biopsy of the dominant left breast mass as well as another smaller breast mass and the left axillary lymph node all of which were positive for metastatic invasive mammary carcinoma grade 2. Tumor was ER 91 200% positive PR 71 to 80% positive and HER-2 IHC equivocalbut negative by FISH  MRI of the bilateral breasts showed no abnormal findings in the  right breast. At least 6 discrete hypoechoic masses in the left breast with the largest one measuring 2.2 cm with 1 abnormal left axillary lymph node. The size of the other breast masses ranging from 67m to 1.4 cm. The total area of masses and non-mass enhancement was about 10 x 5 x 6.5 cm.CT chest abdomen and pelvis with contrast showed no evidence of distant metastatic disease. Bone scan was also negative.  MammaPrint done on the biopsy specimen came back as high risk with greater than 12% chemotherapy benefit.Plan is for neoadjuvant dose dense AC-Taxol chemotherapy followed by surgery.Start of chemotherapy was delayed since patient tested positive for Covid  Interim scans after 4 cycles of dose dense AC chemotherapy showed response to treatment  Interval history-patient reports ongoing fatigue.  She reports some tingling numbness in her hands and feet.  Has dark discoloration of her bilateral hands.  ECOG PS- 1 Pain scale- 0   Review of systems- Review of Systems  Constitutional: Positive for malaise/fatigue. Negative for chills, fever and weight loss.  HENT: Negative for congestion, ear discharge and nosebleeds.   Eyes: Negative for blurred vision.  Respiratory: Negative for cough, hemoptysis, sputum production, shortness of breath and wheezing.   Cardiovascular: Negative for chest pain, palpitations, orthopnea and claudication.  Gastrointestinal: Negative for abdominal pain, blood in stool, constipation, diarrhea, heartburn, melena, nausea and vomiting.  Genitourinary: Negative for dysuria, flank pain, frequency, hematuria and urgency.  Musculoskeletal: Negative for back pain, joint pain and myalgias.  Skin: Negative for rash.  Neurological: Positive for sensory change (Peripheral neuropathy). Negative for dizziness, tingling, focal weakness, seizures, weakness and headaches.  Endo/Heme/Allergies:  Does not bruise/bleed easily.  Psychiatric/Behavioral: Negative for depression  and suicidal ideas. The patient does not have insomnia.      Allergies  Allergen Reactions  . Triamcinolone Other (See Comments)    Hypopigmentation s/p steroid injection  . Tape Rash    SURGICAL TAPE AFTER BREAST BIOPSY     Past Medical History:  Diagnosis Date  . Anemia   . Arthritis   . 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 Wide excision/ RT, T1a,N0. ER/PR pos, Her 2.neg. Tamoxifen x 6 months, d/c secondary to vasomotor sympotms.   . Headache   . Hypertension   . Personal history of radiation therapy 2016   RIGHT lumpectomy  . Thyroid disease      Past Surgical History:  Procedure Laterality Date  . ANTERIOR CRUCIATE LIGAMENT REPAIR Right   . BREAST BIOPSY Right 07/17/2015   INVASIVE LOBULAR CARCINOMA, CLASSIC TYPE (1.5 MM), ARISING IN A   . BREAST BIOPSY Left 09/19/2020   [place] bx 3 areas/ 1oc q clip/ 12 oc vision clip, axilla lymph node hydromark shape 3/ path pending  . BREAST LUMPECTOMY Right 08/22/2015  . BREAST LUMPECTOMY WITH SENTINEL LYMPH NODE BIOPSY Right 08/22/2015   Procedure: BREAST LUMPECTOMY WITH SENTINEL LYMPH NODE BX;  Surgeon: Christene Lye, MD;  Location: [facility];  Service: General;  Laterality: Right;  . COLONOSCOPY W/ POLYPECTOMY  2014  . COLONOSCOPY WITH PROPOFOL N/A 04/16/2019   Procedure: COLONOSCOPY WITH BIOPSIES;  Surgeon: Lucilla Lame, MD;  Location: [place];  Service: Endoscopy;  Laterality: N/A;  . DILATION AND CURETTAGE OF UTERUS     20 years ago  . NOVASURE ABLATION    . POLYPECTOMY N/A 04/16/2019   Procedure: POLYPECTOMY;  Surgeon: Lucilla Lame, MD;  Location: [place];  Service: Endoscopy;  Laterality: N/A;  . PORTACATH PLACEMENT Right 12/01/2020   Procedure: INSERTION PORT-A-CATH;  Surgeon: [name], MD;  Location: [facility];  Service: General;  Laterality: Right;    Social History   Socioeconomic History  . Marital status: Married    Spouse name: Not  on file  . Number of children: Not on file  . Years of education: Not on file  . Highest education level: Not on file  Occupational History  . Not on file  Tobacco Use  . Smoking status: Current Every Day Smoker    Packs/day: 0.50    Years: 40.00    Pack years: 20.00    Types: Cigarettes  . Smokeless tobacco: Never Used  Vaping Use  . Vaping Use: Never used  Substance and Sexual Activity  . Alcohol use: Yes    Alcohol/week: 2.0 standard drinks    Types: 2 Standard drinks or equivalent per week    Comment: BEER 1-2 QD  . Drug use: No  . Sexual activity: Yes    Comment: ablation  Other Topics Concern  . Not on file  Social History Narrative  . Not on file   Social Determinants of Health   Financial Resource Strain: Not on file  Food Insecurity: Not on file  Transportation Needs: Not on file  Physical Activity: Not on file  Stress: Not on file  Social Connections: Not on file  Intimate Partner Violence: Not on file    Family History  Problem Relation Age of Onset  . Stroke Mother   . Cancer Sister        sarcoma on arm; chemo  . Diabetes Sister   .  Stroke Sister   . Diabetes Brother   . Breast cancer Neg Hx   . Ovarian cancer Neg Hx   . Colon cancer Neg Hx   . Heart disease Neg Hx      Current Outpatient Medications:  .  allopurinol (ZYLOPRIM) 100 MG tablet, Take 100 mg by mouth at bedtime., Disp: , Rfl:  .  amLODipine (NORVASC) 10 MG tablet, Take 10 mg by mouth at bedtime. , Disp: , Rfl: 0 .  Calcium Carb-Cholecalciferol (CALCIUM 600+D3 PO), Take 1 tablet by mouth at bedtime., Disp: , Rfl:  .  dexamethasone (DECADRON) 4 MG tablet, Take 2 tablets (8 mg total) by mouth daily. Take daily for 3 days after chemo. Take with food., Disp: 30 tablet, Rfl: 1 .  HYDROcodone-acetaminophen (NORCO/VICODIN) 5-325 MG tablet, Take 1 tablet by mouth every 6 (six) hours as needed for moderate pain. (Patient not taking: Reported on 01/15/2021), Disp: 15 tablet, Rfl: 0 .   ibuprofen (ADVIL) 800 MG tablet, Take 1 tablet (800 mg total) by mouth every 8 (eight) hours as needed. (Patient not taking: Reported on 01/15/2021), Disp: 30 tablet, Rfl: 0 .  lidocaine-prilocaine (EMLA) cream, Apply to affected area once, Disp: 30 g, Rfl: 3 .  LORazepam (ATIVAN) 0.5 MG tablet, Take 1 tablet (0.5 mg total) by mouth every 6 (six) hours as needed (Nausea or vomiting). (Patient not taking: No sig reported), Disp: 30 tablet, Rfl: 0 .  losartan (COZAAR) 100 MG tablet, Take 100 mg by mouth at bedtime. , Disp: , Rfl: 0 .  metoprolol succinate (TOPROL-XL) 25 MG 24 hr tablet, Take 50 mg by mouth at bedtime., Disp: , Rfl: 0 .  ondansetron (ZOFRAN) 8 MG tablet, Take 1 tablet (8 mg total) by mouth 2 (two) times daily as needed. Start on the third day after chemotherapy. (Patient not taking: No sig reported), Disp: 30 tablet, Rfl: 1 .  potassium chloride SA (KLOR-CON) 20 MEQ tablet, Take 1 tablet (20 mEq total) by mouth daily., Disp: 30 tablet, Rfl: 0 .  prochlorperazine (COMPAZINE) 10 MG tablet, Take 1 tablet (10 mg total) by mouth every 6 (six) hours as needed (Nausea or vomiting). (Patient not taking: No sig reported), Disp: 30 tablet, Rfl: 1 .  PROVENTIL HFA 108 (90 Base) MCG/ACT inhaler, Inhale 2 puffs into the lungs every 6 (six) hours as needed (wheezing/shortness of breath.)., Disp: , Rfl:  No current facility-administered medications for this visit.  Facility-Administered Medications Ordered in Other Visits:  .  heparin lock flush 100 unit/mL, 500 Units, Intracatheter, Once PRN, Sindy Guadeloupe, MD .  PACLitaxel (TAXOL) 132 mg in sodium chloride 0.9 % 250 mL chemo infusion (</= 29m/m2), 65 mg/m2 (Treatment Plan Recorded), Intravenous, Once, RSindy Guadeloupe MD, Last Rate: 188 mL/hr at 01/29/21 1214, Infusion Verify at 01/29/21 1214  Physical exam:  Vitals:   01/29/21 0852  BP: (!) 161/93  Pulse: (!) 122  Resp: 20  Temp: 98 F (36.7 C)  TempSrc: Tympanic  SpO2: 99%  Weight: 189 lb  14.4 oz (86.1 kg)   Physical Exam Constitutional:      General: She is not in acute distress. Cardiovascular:     Rate and Rhythm: Regular rhythm. Tachycardia present.     Heart sounds: Normal heart sounds.  Pulmonary:     Effort: Pulmonary effort is normal.  Skin:    General: Skin is warm and dry.  Neurological:     Mental Status: She is alert and oriented to person, place, and time.  CMP Latest Ref Rng & Units 01/29/2021  Glucose 70 - 99 mg/dL 96  BUN 6 - 20 mg/dL 12  Creatinine 0.44 - 1.00 mg/dL 0.64  Sodium 135 - 145 mmol/L 139  Potassium 3.5 - 5.1 mmol/L 3.5  Chloride 98 - 111 mmol/L 107  CO2 22 - 32 mmol/L 22  Calcium 8.9 - 10.3 mg/dL 9.1  Total Protein 6.5 - 8.1 g/dL 6.6  Total Bilirubin 0.3 - 1.2 mg/dL 0.3  Alkaline Phos 38 - 126 U/L 82  AST 15 - 41 U/L 14(L)  ALT 0 - 44 U/L 9   CBC Latest Ref Rng & Units 01/29/2021  WBC 4.0 - 10.5 K/uL 8.7  Hemoglobin 12.0 - 15.0 g/dL 10.5(L)  Hematocrit 36.0 - 46.0 % 32.9(L)  Platelets 150 - 400 K/uL 216    No images are attached to the encounter.  US BREAST LTD UNI LEFT INC AXILLA  Result Date: 01/20/2021 CLINICAL DATA:  Follow-up multicentric left breast cancer and metastatic left axillary adenopathy, status post neoadjuvant chemotherapy. EXAM: ULTRASOUND OF THE LEFT BREAST COMPARISON:  Previous exam(s). FINDINGS: Targeted ultrasound is performed, showing a mild interval decrease in size of multiple irregular, hypoechoic left breast masses since 09/15/2020. One single mass is slightly larger and one is unchanged. Measurements are summarized as follows: 12 o'clock 1 cm from the nipple: 1.6 x 1.5 x 1.0 cm, previously 2.2 x 1.9 x 1.9 cm 1 o'clock 3 cm from the nipple: 0.7 x 0.5 x 0.4 cm, previously 0.8 x 0.7 x 0.5 cm 1 o'clock 5 cm from the nipple: 0.4 x 0.3 x 0.3 cm, previously 0.4 x 0.4 x 0.3 cm 12 o'clock 5 cm from the nipple: 0.4 x 0.4 x 0.3 cm, previously 0.5 x 0.4 x 0.3 cm 11 o'clock 5 cm from the nipple: 0.5 x 0.4 x 0.3  cm, previously 0.5 x 0.4 x 0.3 cm 12 o'clock 6 cm from the nipple: 0.6 x 0.6 x 0.5 cm, previously 0.6 x 0.6 x 0.3 cm Left axillary lymph node: 2.4 mm maximum cortical thickness, previously 4.1 mm IMPRESSION: 1. Mild interval decrease in size of 4 left breast masses, stable size of 1 left breast mass and mild increase in size of 1 left breast mass. 2. Decreased size of a metastatic left axillary lymph node. RECOMMENDATION: Treatment plan. I have discussed the findings and recommendations with the patient. If applicable, a reminder letter will be sent to the patient regarding the next appointment. BI-RADS CATEGORY  6: Known biopsy-proven malignancy. Electronically Signed   By: Claudie Revering M.D.   On: 01/20/2021 12:52     Assessment and plan- Patient is a 60 y.o. female prior history of right breast cancer s/p lumpectomy now with anatomical stage IIAleft breast invasive mammary carcinoma [id number] ER/PR positive and HER-2 negative.  she is s/p 4 cycles of dose dense AC chemotherapy. She is here for on treatment assessment prior to cycle 1 of weekly taxol chemotherapy  Counts ok to proceed with cycle 1 of weekly taxol chemotherapy. Patient reports some new onset tingling numbness in hands and feet. I will therefore dose reduce taxol to 65 mg/meter square. She will proceed with cycle 2 of taxol next week and I will see her in 2 weeks for cycle 3. Plan is to complete 12 cycles followed by surgery.  Peripheral neuropathy: AC chemo typically does not cause this. Currently mild grade 1. Continue to monitor. Patient does not desire any medications at this time.    Visit Diagnosis  1. Encounter for antineoplastic chemotherapy   2. Gout, unspecified cause, unspecified chronicity, unspecified site   3. Vitamin D deficiency   4. Malignant neoplasm of upper-outer quadrant of right breast in female, estrogen receptor positive ([place])      Dr. [name], MD, MPH [facility] [phone] 01/29/2021 12:25 PM

## 2021-01-30 LAB — T3, FREE: T3, Free: 4 pg/mL (ref 2.0–4.4)

## 2021-02-02 ENCOUNTER — Other Ambulatory Visit: Payer: Self-pay | Admitting: Nurse Practitioner

## 2021-02-05 ENCOUNTER — Inpatient Hospital Stay: Payer: 59 | Attending: Oncology

## 2021-02-05 ENCOUNTER — Inpatient Hospital Stay: Payer: 59

## 2021-02-05 ENCOUNTER — Other Ambulatory Visit: Payer: Self-pay | Admitting: Oncology

## 2021-02-05 VITALS — BP 139/86 | HR 91 | Temp 98.6°F | Resp 18 | Wt 190.6 lb

## 2021-02-05 DIAGNOSIS — F17210 Nicotine dependence, cigarettes, uncomplicated: Secondary | ICD-10-CM | POA: Insufficient documentation

## 2021-02-05 DIAGNOSIS — Z79899 Other long term (current) drug therapy: Secondary | ICD-10-CM | POA: Insufficient documentation

## 2021-02-05 DIAGNOSIS — C50411 Malignant neoplasm of upper-outer quadrant of right female breast: Secondary | ICD-10-CM | POA: Insufficient documentation

## 2021-02-05 DIAGNOSIS — I10 Essential (primary) hypertension: Secondary | ICD-10-CM | POA: Insufficient documentation

## 2021-02-05 DIAGNOSIS — G620 Drug-induced polyneuropathy: Secondary | ICD-10-CM | POA: Diagnosis not present

## 2021-02-05 DIAGNOSIS — T451X5A Adverse effect of antineoplastic and immunosuppressive drugs, initial encounter: Secondary | ICD-10-CM | POA: Diagnosis not present

## 2021-02-05 DIAGNOSIS — C50812 Malignant neoplasm of overlapping sites of left female breast: Secondary | ICD-10-CM

## 2021-02-05 DIAGNOSIS — Z170 Estrogen receptor positive status [ER+]: Secondary | ICD-10-CM | POA: Insufficient documentation

## 2021-02-05 DIAGNOSIS — Z923 Personal history of irradiation: Secondary | ICD-10-CM | POA: Insufficient documentation

## 2021-02-05 DIAGNOSIS — Z5111 Encounter for antineoplastic chemotherapy: Secondary | ICD-10-CM | POA: Diagnosis present

## 2021-02-05 LAB — COMPREHENSIVE METABOLIC PANEL
ALT: 10 U/L (ref 0–44)
AST: 16 U/L (ref 15–41)
Albumin: 3.4 g/dL — ABNORMAL LOW (ref 3.5–5.0)
Alkaline Phosphatase: 67 U/L (ref 38–126)
Anion gap: 8 (ref 5–15)
BUN: 12 mg/dL (ref 6–20)
CO2: 23 mmol/L (ref 22–32)
Calcium: 9 mg/dL (ref 8.9–10.3)
Chloride: 108 mmol/L (ref 98–111)
Creatinine, Ser: 0.66 mg/dL (ref 0.44–1.00)
GFR, Estimated: >60 mL/min (ref >60)
Glucose, Bld: 95 mg/dL (ref 70–99)
Potassium: 3.5 mmol/L (ref 3.5–5.1)
Sodium: 139 mmol/L (ref 135–145)
Total Bilirubin: 0.3 mg/dL (ref 0.3–1.2)
Total Protein: 6.4 g/dL — ABNORMAL LOW (ref 6.5–8.1)

## 2021-02-05 LAB — CBC WITH DIFFERENTIAL/PLATELET
Abs Immature Granulocytes: 0.07 10*3/uL (ref 0.00–0.07)
Basophils Absolute: 0 10*3/uL (ref 0.0–0.1)
Basophils Relative: 1 %
Eosinophils Absolute: 0 10*3/uL (ref 0.0–0.5)
Eosinophils Relative: 1 %
HCT: 31.6 % — ABNORMAL LOW (ref 36.0–46.0)
Hemoglobin: 10.3 g/dL — ABNORMAL LOW (ref 12.0–15.0)
Immature Granulocytes: 1 %
Lymphocytes Relative: 13 %
Lymphs Abs: 0.8 10*3/uL (ref 0.7–4.0)
MCH: 28.3 pg (ref 26.0–34.0)
MCHC: 32.6 g/dL (ref 30.0–36.0)
MCV: 86.8 fL (ref 80.0–100.0)
Monocytes Absolute: 0.6 10*3/uL (ref 0.1–1.0)
Monocytes Relative: 11 %
Neutro Abs: 4.2 10*3/uL (ref 1.7–7.7)
Neutrophils Relative %: 73 %
Platelets: 262 10*3/uL (ref 150–400)
RBC: 3.64 MIL/uL — ABNORMAL LOW (ref 3.87–5.11)
RDW: 17.1 % — ABNORMAL HIGH (ref 11.5–15.5)
WBC: 5.7 10*3/uL (ref 4.0–10.5)
nRBC: 0 % (ref 0.0–0.2)

## 2021-02-05 MED ORDER — DEXAMETHASONE SODIUM PHOSPHATE 100 MG/10ML IJ SOLN
20.0000 mg | Freq: Once | INTRAMUSCULAR | Status: AC
  Administered 2021-02-05: 20 mg via INTRAVENOUS
  Filled 2021-02-05: qty 20

## 2021-02-05 MED ORDER — HEPARIN SOD (PORK) LOCK FLUSH 100 UNIT/ML IV SOLN
INTRAVENOUS | Status: AC
  Filled 2021-02-05: qty 5

## 2021-02-05 MED ORDER — SODIUM CHLORIDE 0.9 % IV SOLN
65.0000 mg/m2 | Freq: Once | INTRAVENOUS | Status: AC
  Administered 2021-02-05: 132 mg via INTRAVENOUS
  Filled 2021-02-05: qty 22

## 2021-02-05 MED ORDER — SODIUM CHLORIDE 0.9% FLUSH
10.0000 mL | INTRAVENOUS | Status: DC | PRN
  Administered 2021-02-05: 10 mL via INTRAVENOUS
  Filled 2021-02-05: qty 10

## 2021-02-05 MED ORDER — FAMOTIDINE 20 MG IN NS 100 ML IVPB
20.0000 mg | Freq: Once | INTRAVENOUS | Status: AC
  Administered 2021-02-05: 20 mg via INTRAVENOUS
  Filled 2021-02-05: qty 20

## 2021-02-05 MED ORDER — SODIUM CHLORIDE 0.9 % IV SOLN
Freq: Once | INTRAVENOUS | Status: AC
  Filled 2021-02-05: qty 250

## 2021-02-05 MED ORDER — DIPHENHYDRAMINE HCL 50 MG/ML IJ SOLN
50.0000 mg | Freq: Once | INTRAMUSCULAR | Status: AC
  Administered 2021-02-05: 50 mg via INTRAVENOUS
  Filled 2021-02-05: qty 1

## 2021-02-05 MED ORDER — HEPARIN SOD (PORK) LOCK FLUSH 100 UNIT/ML IV SOLN
500.0000 [IU] | Freq: Once | INTRAVENOUS | Status: AC
  Administered 2021-02-05: 500 [IU] via INTRAVENOUS
  Filled 2021-02-05: qty 5

## 2021-02-12 ENCOUNTER — Ambulatory Visit: Payer: 59

## 2021-02-12 ENCOUNTER — Inpatient Hospital Stay: Payer: 59

## 2021-02-12 ENCOUNTER — Other Ambulatory Visit: Payer: 59

## 2021-02-12 ENCOUNTER — Encounter: Payer: Self-pay | Admitting: Oncology

## 2021-02-12 ENCOUNTER — Ambulatory Visit: Payer: 59 | Admitting: Oncology

## 2021-02-12 ENCOUNTER — Inpatient Hospital Stay (HOSPITAL_BASED_OUTPATIENT_CLINIC_OR_DEPARTMENT_OTHER): Payer: 59 | Admitting: Oncology

## 2021-02-12 VITALS — BP 148/83 | HR 91 | Temp 98.2°F | Resp 16 | Ht 66.0 in | Wt 193.0 lb

## 2021-02-12 DIAGNOSIS — Z170 Estrogen receptor positive status [ER+]: Secondary | ICD-10-CM | POA: Diagnosis not present

## 2021-02-12 DIAGNOSIS — G620 Drug-induced polyneuropathy: Secondary | ICD-10-CM

## 2021-02-12 DIAGNOSIS — T451X5A Adverse effect of antineoplastic and immunosuppressive drugs, initial encounter: Secondary | ICD-10-CM

## 2021-02-12 DIAGNOSIS — Z5111 Encounter for antineoplastic chemotherapy: Secondary | ICD-10-CM

## 2021-02-12 DIAGNOSIS — C50411 Malignant neoplasm of upper-outer quadrant of right female breast: Secondary | ICD-10-CM | POA: Diagnosis not present

## 2021-02-12 DIAGNOSIS — C50812 Malignant neoplasm of overlapping sites of left female breast: Secondary | ICD-10-CM

## 2021-02-12 LAB — CBC WITH DIFFERENTIAL/PLATELET
Abs Immature Granulocytes: 0.06 10*3/uL (ref 0.00–0.07)
Basophils Absolute: 0 10*3/uL (ref 0.0–0.1)
Basophils Relative: 1 %
Eosinophils Absolute: 0.2 10*3/uL (ref 0.0–0.5)
Eosinophils Relative: 4 %
HCT: 34.8 % — ABNORMAL LOW (ref 36.0–46.0)
Hemoglobin: 11.1 g/dL — ABNORMAL LOW (ref 12.0–15.0)
Immature Granulocytes: 2 %
Lymphocytes Relative: 21 %
Lymphs Abs: 0.8 10*3/uL (ref 0.7–4.0)
MCH: 27.6 pg (ref 26.0–34.0)
MCHC: 31.9 g/dL (ref 30.0–36.0)
MCV: 86.6 fL (ref 80.0–100.0)
Monocytes Absolute: 0.4 10*3/uL (ref 0.1–1.0)
Monocytes Relative: 9 %
Neutro Abs: 2.5 10*3/uL (ref 1.7–7.7)
Neutrophils Relative %: 63 %
Platelets: 237 10*3/uL (ref 150–400)
RBC: 4.02 MIL/uL (ref 3.87–5.11)
RDW: 17.5 % — ABNORMAL HIGH (ref 11.5–15.5)
WBC: 4 10*3/uL (ref 4.0–10.5)
nRBC: 0 % (ref 0.0–0.2)

## 2021-02-12 LAB — COMPREHENSIVE METABOLIC PANEL
ALT: 11 U/L (ref 0–44)
AST: 16 U/L (ref 15–41)
Albumin: 3.7 g/dL (ref 3.5–5.0)
Alkaline Phosphatase: 47 U/L (ref 38–126)
Anion gap: 7 (ref 5–15)
BUN: 12 mg/dL (ref 6–20)
CO2: 24 mmol/L (ref 22–32)
Calcium: 8.8 mg/dL — ABNORMAL LOW (ref 8.9–10.3)
Chloride: 105 mmol/L (ref 98–111)
Creatinine, Ser: 0.53 mg/dL (ref 0.44–1.00)
GFR, Estimated: >60 mL/min (ref >60)
Glucose, Bld: 86 mg/dL (ref 70–99)
Potassium: 3.6 mmol/L (ref 3.5–5.1)
Sodium: 136 mmol/L (ref 135–145)
Total Bilirubin: 0.4 mg/dL (ref 0.3–1.2)
Total Protein: 6.3 g/dL — ABNORMAL LOW (ref 6.5–8.1)

## 2021-02-12 MED ORDER — HEPARIN SOD (PORK) LOCK FLUSH 100 UNIT/ML IV SOLN
500.0000 [IU] | Freq: Once | INTRAVENOUS | Status: AC | PRN
Start: 2021-02-12 — End: 2021-02-12
  Administered 2021-02-12: 500 [IU]
  Filled 2021-02-12: qty 5

## 2021-02-12 MED ORDER — HEPARIN SOD (PORK) LOCK FLUSH 100 UNIT/ML IV SOLN
INTRAVENOUS | Status: AC
  Filled 2021-02-12: qty 5

## 2021-02-12 MED ORDER — SODIUM CHLORIDE 0.9 % IV SOLN
Freq: Once | INTRAVENOUS | Status: AC
  Filled 2021-02-12: qty 250

## 2021-02-12 MED ORDER — SODIUM CHLORIDE 0.9 % IV SOLN
65.0000 mg/m2 | Freq: Once | INTRAVENOUS | Status: AC
  Administered 2021-02-12: 132 mg via INTRAVENOUS
  Filled 2021-02-12: qty 22

## 2021-02-12 MED ORDER — SODIUM CHLORIDE 0.9 % IV SOLN
20.0000 mg | Freq: Once | INTRAVENOUS | Status: AC
  Administered 2021-02-12: 20 mg via INTRAVENOUS
  Filled 2021-02-12: qty 20

## 2021-02-12 MED ORDER — SODIUM CHLORIDE 0.9% FLUSH
10.0000 mL | Freq: Once | INTRAVENOUS | Status: AC
  Administered 2021-02-12: 10 mL via INTRAVENOUS
  Filled 2021-02-12: qty 10

## 2021-02-12 MED ORDER — DIPHENHYDRAMINE HCL 50 MG/ML IJ SOLN
50.0000 mg | Freq: Once | INTRAMUSCULAR | Status: AC
  Administered 2021-02-12: 50 mg via INTRAVENOUS
  Filled 2021-02-12: qty 1

## 2021-02-12 MED ORDER — FAMOTIDINE 20 MG IN NS 100 ML IVPB
20.0000 mg | Freq: Once | INTRAVENOUS | Status: AC
  Administered 2021-02-12: 20 mg via INTRAVENOUS
  Filled 2021-02-12: qty 20
  Filled 2021-02-12: qty 100

## 2021-02-12 NOTE — Progress Notes (Signed)
Pt states I/o is good, BM good. Her fingers with tenderness doing better. Soreness in toes was before chemo and it is getting better. Pt states that the 3 days she take dexmethasone she feels acid reflux. Pt states that she has sweet taste in her mouth all the time and she does not eat sweets

## 2021-02-12 NOTE — Progress Notes (Signed)
Hematology/Oncology Consult note [facility]  [phone] [phone]  Patient Care Team: Casilda Carls, MD as PCP - General (Internal Medicine) Casilda Carls, MD as Consulting Physician (Internal Medicine) Christene Lye, MD (General Surgery) Garrel Ridgel, [place] as Consulting Physician (Podiatry) Sindy Guadeloupe, MD as Consulting Physician (Hematology and Oncology)   Name of the patient: Michelle Walker  [id number]  [birth year]   Date of visit: 02/12/21  Diagnosis- recurrent left breast cancer stage II [id number] ER/PR positive HER-2 negative  Chief complaint/ Reason for visit-on treatment assessment prior to cycle 3 of weekly Taxol chemotherapy  Heme/Onc history:Patient is a 60 year old female who was diagnosed with 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 She did not require adjuvant chemotherapy. She was initially on tamoxifen which she could not tolerate and was switched to letrozole. Last seen by me in June 2019 at which point she had a menstrual cycle and therefore not given another AI. She had subsequently did not follow-up with me and remained off hormone therapy.   More recently patient underwent a diagnostic bilateral mammogram which showed a 2.2 x 1.9 x 1.9 cm mass at the 12 o'clock position of the left breast 1 cm from the nipple. She was also found to have 5 other smaller breast masses ranging from 0.3 to 0.8 cm. Noted to have a solitary left axillary lymph node with cortical thickening. She had a biopsy of the dominant left breast mass as well as another smaller breast mass and the left axillary lymph node all of which were positive for metastatic invasive mammary carcinoma grade 2. Tumor was ER 91 200% positive PR 71 to 80% positive and HER-2 IHC equivocalbut negative by FISH  MRI of the bilateral breasts showed no abnormal findings in the  right breast. At least 6 discrete hypoechoic masses in the left breast with the largest one measuring 2.2 cm with 1 abnormal left axillary lymph node. The size of the other breast masses ranging from 12m to 1.4 cm. The total area of masses and non-mass enhancement was about 10 x 5 x 6.5 cm.CT chest abdomen and pelvis with contrast showed no evidence of distant metastatic disease. Bone scan was also negative.  MammaPrint done on the biopsy specimen came back as high risk with greater than 12% chemotherapy benefit.Plan is for neoadjuvant dose dense AC-Taxol chemotherapy followed by surgery.Start of chemotherapy was delayed since patient tested positive for Covid  Interim scans after 4 cycles of dose dense AC chemotherapy showed response to treatment  Interval history-patient reports neuropathy in her hands and feet is actually improving.  [facility]  Denies any significant nausea or vomiting.  Does report some heartburn when she takes steroids  ECOG PS- 1 Pain scale- 0   Review of systems- Review of Systems  Constitutional: Negative for chills, fever, malaise/fatigue and weight loss.  HENT: Negative for congestion, ear discharge and nosebleeds.   Eyes: Negative for blurred vision.  Respiratory: Negative for cough, hemoptysis, sputum production, shortness of breath and wheezing.   Cardiovascular: Negative for chest pain, palpitations, orthopnea and claudication.  Gastrointestinal: Negative for abdominal pain, blood in stool, constipation, diarrhea, heartburn, melena, nausea and vomiting.  Genitourinary: Negative for dysuria, flank pain, frequency, hematuria and urgency.  Musculoskeletal: Negative for back pain, joint pain and myalgias.  Skin: Negative for rash.  Neurological: Positive for sensory change (Peripheral neuropathy).  Negative for dizziness, tingling, focal weakness, seizures, weakness and headaches.  Endo/Heme/Allergies: Does not  bruise/bleed easily.  Psychiatric/Behavioral: Negative for depression and suicidal ideas. The patient does not have insomnia.       Allergies  Allergen Reactions  . Triamcinolone Other (See Comments)    Hypopigmentation s/p steroid injection  . Tape Rash    SURGICAL TAPE AFTER BREAST BIOPSY     Past Medical History:  Diagnosis Date  . Anemia   . Arthritis   . 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 Wide excision/ RT, T1a,N0. ER/PR pos, Her 2.neg. Tamoxifen x 6 months, d/c secondary to vasomotor sympotms.   . Headache   . Hypertension   . Personal history of radiation therapy 2016   RIGHT lumpectomy  . Thyroid disease      Past Surgical History:  Procedure Laterality Date  . ANTERIOR CRUCIATE LIGAMENT REPAIR Right   . BREAST BIOPSY Right 07/17/2015   INVASIVE LOBULAR CARCINOMA, CLASSIC TYPE (1.5 MM), ARISING IN A   . BREAST BIOPSY Left 09/19/2020   [place] bx 3 areas/ 1oc q clip/ 12 oc vision clip, axilla lymph node hydromark shape 3/ path pending  . BREAST LUMPECTOMY Right 08/22/2015  . BREAST LUMPECTOMY WITH SENTINEL LYMPH NODE BIOPSY Right 08/22/2015   Procedure: BREAST LUMPECTOMY WITH SENTINEL LYMPH NODE BX;  Surgeon: Christene Lye, MD;  Location: [facility];  Service: General;  Laterality: Right;  . COLONOSCOPY W/ POLYPECTOMY  2014  . COLONOSCOPY WITH PROPOFOL N/A 04/16/2019   Procedure: COLONOSCOPY WITH BIOPSIES;  Surgeon: Lucilla Lame, MD;  Location: [facility];  Service: Endoscopy;  Laterality: N/A;  . DILATION AND CURETTAGE OF UTERUS     20 years ago  . NOVASURE ABLATION    . POLYPECTOMY N/A 04/16/2019   Procedure: POLYPECTOMY;  Surgeon: Lucilla Lame, MD;  Location: [place];  Service: Endoscopy;  Laterality: N/A;  . PORTACATH PLACEMENT Right 12/01/2020   Procedure: INSERTION PORT-A-CATH;  Surgeon: [name], MD;  Location: [facility];  Service: General;  Laterality: Right;    Social History    Socioeconomic History  . Marital status: Married    Spouse name: Not on file  . Number of children: Not on file  . Years of education: Not on file  . Highest education level: Not on file  Occupational History  . Not on file  Tobacco Use  . Smoking status: Current Every Day Smoker    Packs/day: 0.50    Years: 40.00    Pack years: 20.00    Types: Cigarettes  . Smokeless tobacco: Never Used  Vaping Use  . Vaping Use: Never used  Substance and Sexual Activity  . Alcohol use: Yes    Alcohol/week: 2.0 standard drinks    Types: 2 Standard drinks or equivalent per week    Comment: BEER 1-2 QD  . Drug use: No  . Sexual activity: Yes    Comment: ablation  Other Topics Concern  . Not on file  Social History Narrative  . Not on file   Social Determinants of Health   Financial Resource Strain: Not on file  Food Insecurity: Not on file  Transportation Needs: Not on file  Physical Activity: Not on file  Stress: Not on file  Social Connections: Not on file  Intimate Partner Violence: Not on file    Family History  Problem Relation Age of Onset  . Stroke Mother   . Cancer Sister  sarcoma on arm; chemo  . Diabetes Sister   . Stroke Sister   . Diabetes Brother   . Breast cancer Neg Hx   . Ovarian cancer Neg Hx   . Colon cancer Neg Hx   . Heart disease Neg Hx      Current Outpatient Medications:  .  allopurinol (ZYLOPRIM) 100 MG tablet, Take 100 mg by mouth at bedtime., Disp: , Rfl:  .  amLODipine (NORVASC) 10 MG tablet, Take 10 mg by mouth at bedtime. , Disp: , Rfl: 0 .  Calcium Carb-Cholecalciferol (CALCIUM 600+D3 PO), Take 1 tablet by mouth at bedtime., Disp: , Rfl:  .  dexamethasone (DECADRON) 4 MG tablet, Take 2 tablets (8 mg total) by mouth daily. Take daily for 3 days after chemo. Take with food., Disp: 30 tablet, Rfl: 1 .  HYDROcodone-acetaminophen (NORCO/VICODIN) 5-325 MG tablet, Take 1 tablet by mouth every 6 (six) hours as needed for moderate pain.  (Patient not taking: Reported on 01/15/2021), Disp: 15 tablet, Rfl: 0 .  ibuprofen (ADVIL) 800 MG tablet, Take 1 tablet (800 mg total) by mouth every 8 (eight) hours as needed. (Patient not taking: Reported on 01/15/2021), Disp: 30 tablet, Rfl: 0 .  lidocaine-prilocaine (EMLA) cream, Apply to affected area once, Disp: 30 g, Rfl: 3 .  LORazepam (ATIVAN) 0.5 MG tablet, Take 1 tablet (0.5 mg total) by mouth every 6 (six) hours as needed (Nausea or vomiting). (Patient not taking: No sig reported), Disp: 30 tablet, Rfl: 0 .  losartan (COZAAR) 100 MG tablet, Take 100 mg by mouth at bedtime. , Disp: , Rfl: 0 .  metoprolol succinate (TOPROL-XL) 25 MG 24 hr tablet, Take 50 mg by mouth at bedtime., Disp: , Rfl: 0 .  ondansetron (ZOFRAN) 8 MG tablet, Take 1 tablet (8 mg total) by mouth 2 (two) times daily as needed. Start on the third day after chemotherapy. (Patient not taking: No sig reported), Disp: 30 tablet, Rfl: 1 .  potassium chloride SA (KLOR-CON) 20 MEQ tablet, TAKE 1 TABLET(20 MEQ) BY MOUTH DAILY, Disp: 30 tablet, Rfl: 0 .  prochlorperazine (COMPAZINE) 10 MG tablet, Take 1 tablet (10 mg total) by mouth every 6 (six) hours as needed (Nausea or vomiting). (Patient not taking: No sig reported), Disp: 30 tablet, Rfl: 1 .  PROVENTIL HFA 108 (90 Base) MCG/ACT inhaler, Inhale 2 puffs into the lungs every 6 (six) hours as needed (wheezing/shortness of breath.)., Disp: , Rfl:   Physical exam:  Vitals:   02/12/21 0845  BP: (!) 148/83  Pulse: 91  Resp: 16  Temp: 98.2 F (36.8 C)  TempSrc: Oral  Weight: 193 lb (87.5 kg)  Height: '5\' 6"'  (1.676 m)   Physical Exam HENT:     Head: Normocephalic and atraumatic.  Eyes:     Pupils: Pupils are equal, round, and reactive to light.  Cardiovascular:     Rate and Rhythm: Normal rate and regular rhythm.     Heart sounds: Normal heart sounds.  Pulmonary:     Effort: Pulmonary effort is normal.     Breath sounds: Normal breath sounds.  Abdominal:     General:  Bowel sounds are normal.     Palpations: Abdomen is soft.  Musculoskeletal:     Cervical back: Normal range of motion.  Skin:    General: Skin is warm and dry.  Neurological:     Mental Status: She is alert and oriented to person, place, and time.      CMP Latest Ref Rng &  Units 02/05/2021  Glucose 70 - 99 mg/dL 95  BUN 6 - 20 mg/dL 12  Creatinine 0.44 - 1.00 mg/dL 0.66  Sodium 135 - 145 mmol/L 139  Potassium 3.5 - 5.1 mmol/L 3.5  Chloride 98 - 111 mmol/L 108  CO2 22 - 32 mmol/L 23  Calcium 8.9 - 10.3 mg/dL 9.0  Total Protein 6.5 - 8.1 g/dL 6.4(L)  Total Bilirubin 0.3 - 1.2 mg/dL 0.3  Alkaline Phos 38 - 126 U/L 67  AST 15 - 41 U/L 16  ALT 0 - 44 U/L 10   CBC Latest Ref Rng & Units 02/05/2021  WBC 4.0 - 10.5 K/uL 5.7  Hemoglobin 12.0 - 15.0 g/dL 10.3(L)  Hematocrit 36.0 - 46.0 % 31.6(L)  Platelets 150 - 400 K/uL 262    No images are attached to the encounter.  US BREAST LTD UNI LEFT INC AXILLA  Result Date: 01/20/2021 CLINICAL DATA:  Follow-up multicentric left breast cancer and metastatic left axillary adenopathy, status post neoadjuvant chemotherapy. EXAM: ULTRASOUND OF THE LEFT BREAST COMPARISON:  Previous exam(s). FINDINGS: Targeted ultrasound is performed, showing a mild interval decrease in size of multiple irregular, hypoechoic left breast masses since 09/15/2020. One single mass is slightly larger and one is unchanged. Measurements are summarized as follows: 12 o'clock 1 cm from the nipple: 1.6 x 1.5 x 1.0 cm, previously 2.2 x 1.9 x 1.9 cm 1 o'clock 3 cm from the nipple: 0.7 x 0.5 x 0.4 cm, previously 0.8 x 0.7 x 0.5 cm 1 o'clock 5 cm from the nipple: 0.4 x 0.3 x 0.3 cm, previously 0.4 x 0.4 x 0.3 cm 12 o'clock 5 cm from the nipple: 0.4 x 0.4 x 0.3 cm, previously 0.5 x 0.4 x 0.3 cm 11 o'clock 5 cm from the nipple: 0.5 x 0.4 x 0.3 cm, previously 0.5 x 0.4 x 0.3 cm 12 o'clock 6 cm from the nipple: 0.6 x 0.6 x 0.5 cm, previously 0.6 x 0.6 x 0.3 cm Left axillary lymph node: 2.4 mm  maximum cortical thickness, previously 4.1 mm IMPRESSION: 1. Mild interval decrease in size of 4 left breast masses, stable size of 1 left breast mass and mild increase in size of 1 left breast mass. 2. Decreased size of a metastatic left axillary lymph node. RECOMMENDATION: Treatment plan. I have discussed the findings and recommendations with the patient. If applicable, a reminder letter will be sent to the patient regarding the next appointment. BI-RADS CATEGORY  6: Known biopsy-proven malignancy. Electronically Signed   By: Claudie Revering M.D.   On: 01/20/2021 12:52     Assessment and plan- Patient is a 60 y.o. female prior history of right breast cancer s/p lumpectomy now with anatomical stage IIAleft breast invasive mammary carcinoma [id number] ER/PR positive and HER-2 negative.she is s/p 4 cycles of dose dense AC chemotherapy.  She is here for on treatment assessment prior to cycle 3 of weekly Taxol chemotherapy  Counts okay to proceed with cycle 3 of weekly Taxol chemotherapy today.  She will directly proceed for cycle 4 next week and I will see her back in 2 weeks for cycle 5.  Chemo-induced peripheral neuropathy: Mild grade 1.  Taxol has been reduced.  She is not on any medications.  Continue to monitor  I have asked her to stop taking the Decadron for now as she is not having significant nausea vomiting with Taxol and we will see if her symptoms of heartburn improve   Visit Diagnosis 1. Encounter for antineoplastic chemotherapy  2. Malignant neoplasm of upper-outer quadrant of right breast in female, estrogen receptor positive ([place])   3. Chemotherapy-induced peripheral neuropathy ([place])      Dr. [name], MD, MPH [facility] [phone] 02/12/2021 8:24 AM

## 2021-02-19 ENCOUNTER — Ambulatory Visit: Payer: 59

## 2021-02-19 ENCOUNTER — Inpatient Hospital Stay: Payer: 59

## 2021-02-19 ENCOUNTER — Other Ambulatory Visit: Payer: 59

## 2021-02-19 VITALS — BP 148/83 | HR 92 | Temp 96.4°F | Resp 18 | Wt 192.5 lb

## 2021-02-19 DIAGNOSIS — Z170 Estrogen receptor positive status [ER+]: Secondary | ICD-10-CM

## 2021-02-19 DIAGNOSIS — Z5111 Encounter for antineoplastic chemotherapy: Secondary | ICD-10-CM | POA: Diagnosis not present

## 2021-02-19 DIAGNOSIS — C50812 Malignant neoplasm of overlapping sites of left female breast: Secondary | ICD-10-CM

## 2021-02-19 LAB — COMPREHENSIVE METABOLIC PANEL
ALT: 15 U/L (ref 0–44)
AST: 24 U/L (ref 15–41)
Albumin: 3.7 g/dL (ref 3.5–5.0)
Alkaline Phosphatase: 62 U/L (ref 38–126)
Anion gap: 9 (ref 5–15)
BUN: 10 mg/dL (ref 6–20)
CO2: 23 mmol/L (ref 22–32)
Calcium: 9.4 mg/dL (ref 8.9–10.3)
Chloride: 107 mmol/L (ref 98–111)
Creatinine, Ser: 0.56 mg/dL (ref 0.44–1.00)
GFR, Estimated: >60 mL/min (ref >60)
Glucose, Bld: 104 mg/dL — ABNORMAL HIGH (ref 70–99)
Potassium: 3.5 mmol/L (ref 3.5–5.1)
Sodium: 139 mmol/L (ref 135–145)
Total Bilirubin: 0.5 mg/dL (ref 0.3–1.2)
Total Protein: 6.7 g/dL (ref 6.5–8.1)

## 2021-02-19 LAB — CBC WITH DIFFERENTIAL/PLATELET
Abs Immature Granulocytes: 0.06 10*3/uL (ref 0.00–0.07)
Basophils Absolute: 0 10*3/uL (ref 0.0–0.1)
Basophils Relative: 1 %
Eosinophils Absolute: 0.2 10*3/uL (ref 0.0–0.5)
Eosinophils Relative: 4 %
HCT: 35.7 % — ABNORMAL LOW (ref 36.0–46.0)
Hemoglobin: 11.5 g/dL — ABNORMAL LOW (ref 12.0–15.0)
Immature Granulocytes: 1 %
Lymphocytes Relative: 18 %
Lymphs Abs: 0.8 10*3/uL (ref 0.7–4.0)
MCH: 28.2 pg (ref 26.0–34.0)
MCHC: 32.2 g/dL (ref 30.0–36.0)
MCV: 87.5 fL (ref 80.0–100.0)
Monocytes Absolute: 0.5 10*3/uL (ref 0.1–1.0)
Monocytes Relative: 10 %
Neutro Abs: 3 10*3/uL (ref 1.7–7.7)
Neutrophils Relative %: 66 %
Platelets: 214 10*3/uL (ref 150–400)
RBC: 4.08 MIL/uL (ref 3.87–5.11)
RDW: 17.5 % — ABNORMAL HIGH (ref 11.5–15.5)
WBC: 4.6 10*3/uL (ref 4.0–10.5)
nRBC: 0 % (ref 0.0–0.2)

## 2021-02-19 MED ORDER — SODIUM CHLORIDE 0.9% FLUSH
10.0000 mL | INTRAVENOUS | Status: DC | PRN
Start: 2021-02-19 — End: 2021-02-19
  Administered 2021-02-19: 10 mL via INTRAVENOUS
  Filled 2021-02-19: qty 10

## 2021-02-19 MED ORDER — DIPHENHYDRAMINE HCL 50 MG/ML IJ SOLN
50.0000 mg | Freq: Once | INTRAMUSCULAR | Status: AC
Start: 2021-02-19 — End: 2021-02-19
  Administered 2021-02-19: 50 mg via INTRAVENOUS
  Filled 2021-02-19: qty 1

## 2021-02-19 MED ORDER — FAMOTIDINE 20 MG IN NS 100 ML IVPB
20.0000 mg | Freq: Once | INTRAVENOUS | Status: AC
  Administered 2021-02-19: 20 mg via INTRAVENOUS
  Filled 2021-02-19: qty 20

## 2021-02-19 MED ORDER — SODIUM CHLORIDE 0.9 % IV SOLN
Freq: Once | INTRAVENOUS | Status: AC
  Filled 2021-02-19: qty 250

## 2021-02-19 MED ORDER — HEPARIN SOD (PORK) LOCK FLUSH 100 UNIT/ML IV SOLN
500.0000 [IU] | Freq: Once | INTRAVENOUS | Status: AC
  Administered 2021-02-19: 500 [IU] via INTRAVENOUS
  Filled 2021-02-19: qty 5

## 2021-02-19 MED ORDER — DEXAMETHASONE SODIUM PHOSPHATE 100 MG/10ML IJ SOLN
20.0000 mg | Freq: Once | INTRAMUSCULAR | Status: AC
  Administered 2021-02-19: 20 mg via INTRAVENOUS
  Filled 2021-02-19: qty 20

## 2021-02-19 MED ORDER — HEPARIN SOD (PORK) LOCK FLUSH 100 UNIT/ML IV SOLN
INTRAVENOUS | Status: AC
  Filled 2021-02-19: qty 5

## 2021-02-19 MED ORDER — PACLITAXEL CHEMO INJECTION 300 MG/50ML
65.0000 mg/m2 | Freq: Once | INTRAVENOUS | Status: AC
Start: 2021-02-19 — End: 2021-02-19
  Administered 2021-02-19: 132 mg via INTRAVENOUS
  Filled 2021-02-19: qty 22

## 2021-02-26 ENCOUNTER — Ambulatory Visit: Payer: 59

## 2021-02-26 ENCOUNTER — Ambulatory Visit: Payer: 59 | Admitting: Oncology

## 2021-02-26 ENCOUNTER — Other Ambulatory Visit: Payer: Self-pay

## 2021-02-26 ENCOUNTER — Inpatient Hospital Stay: Payer: 59

## 2021-02-26 ENCOUNTER — Encounter: Payer: Self-pay | Admitting: Oncology

## 2021-02-26 ENCOUNTER — Other Ambulatory Visit: Payer: 59

## 2021-02-26 ENCOUNTER — Inpatient Hospital Stay (HOSPITAL_BASED_OUTPATIENT_CLINIC_OR_DEPARTMENT_OTHER): Payer: 59 | Admitting: Oncology

## 2021-02-26 VITALS — BP 155/93 | HR 80 | Temp 97.9°F | Resp 16 | Wt 194.7 lb

## 2021-02-26 DIAGNOSIS — Z170 Estrogen receptor positive status [ER+]: Secondary | ICD-10-CM

## 2021-02-26 DIAGNOSIS — Z5111 Encounter for antineoplastic chemotherapy: Secondary | ICD-10-CM

## 2021-02-26 DIAGNOSIS — C50411 Malignant neoplasm of upper-outer quadrant of right female breast: Secondary | ICD-10-CM

## 2021-02-26 DIAGNOSIS — C50812 Malignant neoplasm of overlapping sites of left female breast: Secondary | ICD-10-CM

## 2021-02-26 LAB — CBC WITH DIFFERENTIAL/PLATELET
Abs Immature Granulocytes: 0.06 10*3/uL (ref 0.00–0.07)
Basophils Absolute: 0 10*3/uL (ref 0.0–0.1)
Basophils Relative: 1 %
Eosinophils Absolute: 0.1 10*3/uL (ref 0.0–0.5)
Eosinophils Relative: 3 %
HCT: 35.8 % — ABNORMAL LOW (ref 36.0–46.0)
Hemoglobin: 11.2 g/dL — ABNORMAL LOW (ref 12.0–15.0)
Immature Granulocytes: 1 %
Lymphocytes Relative: 15 %
Lymphs Abs: 0.7 10*3/uL (ref 0.7–4.0)
MCH: 27.6 pg (ref 26.0–34.0)
MCHC: 31.3 g/dL (ref 30.0–36.0)
MCV: 88.2 fL (ref 80.0–100.0)
Monocytes Absolute: 0.4 10*3/uL (ref 0.1–1.0)
Monocytes Relative: 9 %
Neutro Abs: 3.3 10*3/uL (ref 1.7–7.7)
Neutrophils Relative %: 71 %
Platelets: 192 10*3/uL (ref 150–400)
RBC: 4.06 MIL/uL (ref 3.87–5.11)
RDW: 17.2 % — ABNORMAL HIGH (ref 11.5–15.5)
WBC: 4.6 10*3/uL (ref 4.0–10.5)
nRBC: 0 % (ref 0.0–0.2)

## 2021-02-26 LAB — COMPREHENSIVE METABOLIC PANEL
ALT: 12 U/L (ref 0–44)
AST: 17 U/L (ref 15–41)
Albumin: 3.8 g/dL (ref 3.5–5.0)
Alkaline Phosphatase: 73 U/L (ref 38–126)
Anion gap: 9 (ref 5–15)
BUN: 19 mg/dL (ref 6–20)
CO2: 23 mmol/L (ref 22–32)
Calcium: 9.4 mg/dL (ref 8.9–10.3)
Chloride: 106 mmol/L (ref 98–111)
Creatinine, Ser: 0.65 mg/dL (ref 0.44–1.00)
GFR, Estimated: >60 mL/min (ref >60)
Glucose, Bld: 94 mg/dL (ref 70–99)
Potassium: 3.5 mmol/L (ref 3.5–5.1)
Sodium: 138 mmol/L (ref 135–145)
Total Bilirubin: 0.5 mg/dL (ref 0.3–1.2)
Total Protein: 6.7 g/dL (ref 6.5–8.1)

## 2021-02-26 MED ORDER — DEXAMETHASONE SODIUM PHOSPHATE 100 MG/10ML IJ SOLN
20.0000 mg | Freq: Once | INTRAMUSCULAR | Status: AC
  Administered 2021-02-26: 20 mg via INTRAVENOUS
  Filled 2021-02-26: qty 20

## 2021-02-26 MED ORDER — SODIUM CHLORIDE 0.9 % IV SOLN
Freq: Once | INTRAVENOUS | Status: AC
  Filled 2021-02-26: qty 250

## 2021-02-26 MED ORDER — FAMOTIDINE 20 MG IN NS 100 ML IVPB
20.0000 mg | Freq: Once | INTRAVENOUS | Status: AC
  Administered 2021-02-26: 20 mg via INTRAVENOUS
  Filled 2021-02-26: qty 20

## 2021-02-26 MED ORDER — DIPHENHYDRAMINE HCL 50 MG/ML IJ SOLN
50.0000 mg | Freq: Once | INTRAMUSCULAR | Status: AC
  Administered 2021-02-26: 50 mg via INTRAVENOUS
  Filled 2021-02-26: qty 1

## 2021-02-26 MED ORDER — HEPARIN SOD (PORK) LOCK FLUSH 100 UNIT/ML IV SOLN
500.0000 [IU] | Freq: Once | INTRAVENOUS | Status: AC | PRN
  Administered 2021-02-26: 500 [IU]
  Filled 2021-02-26: qty 5

## 2021-02-26 MED ORDER — SODIUM CHLORIDE 0.9 % IV SOLN
65.0000 mg/m2 | Freq: Once | INTRAVENOUS | Status: AC
  Administered 2021-02-26: 132 mg via INTRAVENOUS
  Filled 2021-02-26: qty 22

## 2021-02-26 MED ORDER — HEPARIN SOD (PORK) LOCK FLUSH 100 UNIT/ML IV SOLN
INTRAVENOUS | Status: AC
  Filled 2021-02-26: qty 5

## 2021-02-26 NOTE — Progress Notes (Signed)
Hematology/Oncology Consult note [facility]  [phone] [phone]  Patient Care Team: Casilda Carls, MD as PCP - General (Internal Medicine) Casilda Carls, MD as Consulting Physician (Internal Medicine) Christene Lye, MD (General Surgery) Garrel Ridgel, [place] as Consulting Physician (Podiatry) Sindy Guadeloupe, MD as Consulting Physician (Hematology and Oncology)   Name of the patient: Michelle Walker  [id number]  [birth year]   Date of visit: 02/26/21  Diagnosis- recurrent left breast cancer stage II [id number] ER/PR positive HER-2 negative  Chief complaint/ Reason for visit-on treatment assessment prior to cycle 5 of weekly Taxol chemotherapy  Heme/Onc history: Patient is a 60 year old female who was diagnosed with 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 She did not require adjuvant chemotherapy. She was initially on tamoxifen which she could not tolerate and was switched to letrozole. Last seen by me in June 2019 at which point she had a menstrual cycle and therefore not given another AI. She had subsequently did not follow-up with me and remained off hormone therapy.   More recently patient underwent a diagnostic bilateral mammogram which showed a 2.2 x 1.9 x 1.9 cm mass at the 12 o'clock position of the left breast 1 cm from the nipple. She was also found to have 5 other smaller breast masses ranging from 0.3 to 0.8 cm. Noted to have a solitary left axillary lymph node with cortical thickening. She had a biopsy of the dominant left breast mass as well as another smaller breast mass and the left axillary lymph node all of which were positive for metastatic invasive mammary carcinoma grade 2. Tumor was ER 91 200% positive PR 71 to 80% positive and HER-2 IHC equivocalbut negative by FISH  MRI of the bilateral breasts showed no abnormal findings in the  right breast. At least 6 discrete hypoechoic masses in the left breast with the largest one measuring 2.2 cm with 1 abnormal left axillary lymph node. The size of the other breast masses ranging from 61m to 1.4 cm. The total area of masses and non-mass enhancement was about 10 x 5 x 6.5 cm.CT chest abdomen and pelvis with contrast showed no evidence of distant metastatic disease. Bone scan was also negative.  MammaPrint done on the biopsy specimen came back as high risk with greater than 12% chemotherapy benefit.Plan is for neoadjuvant dose dense AC-Taxol chemotherapy followed by surgery.Start of chemotherapy was delayed since patient tested positive for Covid  Interim scans after 4 cycles of dose dense AC chemotherapy showed response to treatment  Interval history-tolerating treatment well without any significant nausea vomiting or peripheral neuropathy.  She has dark discoloration of her bilateral palms as well as nail changes which has been her main issue with Taxol.  She continues to work.  ECOG PS- 1 Pain scale- 0   Review of systems- Review of Systems  Constitutional: Positive for malaise/fatigue. Negative for chills, fever and weight loss.  HENT: Negative for congestion, ear discharge and nosebleeds.   Eyes: Negative for blurred vision.  Respiratory: Negative for cough, hemoptysis, sputum production, shortness of breath and wheezing.   Cardiovascular: Negative for chest pain, palpitations, orthopnea and claudication.  Gastrointestinal: Negative for abdominal pain, blood in stool, constipation, diarrhea, heartburn, melena, nausea and vomiting.  Genitourinary: Negative for dysuria, flank pain, frequency, hematuria and urgency.  Musculoskeletal: Negative for back pain, joint pain and myalgias.  Skin: Negative for rash.  Nail changes and bilateral discoloration of palms  Neurological: Negative for dizziness, tingling, focal weakness, seizures, weakness and headaches.   Endo/Heme/Allergies: Does not bruise/bleed easily.  Psychiatric/Behavioral: Negative for depression and suicidal ideas. The patient does not have insomnia.       Allergies  Allergen Reactions  . Triamcinolone Other (See Comments)    Hypopigmentation s/p steroid injection  . Tape Rash    SURGICAL TAPE AFTER BREAST BIOPSY     Past Medical History:  Diagnosis Date  . Anemia   . Arthritis   . 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 Wide excision/ RT, T1a,N0. ER/PR pos, Her 2.neg. Tamoxifen x 6 months, d/c secondary to vasomotor sympotms.   . Headache   . Hypertension   . Personal history of radiation therapy 2016   RIGHT lumpectomy  . Thyroid disease      Past Surgical History:  Procedure Laterality Date  . ANTERIOR CRUCIATE LIGAMENT REPAIR Right   . BREAST BIOPSY Right 07/17/2015   INVASIVE LOBULAR CARCINOMA, CLASSIC TYPE (1.5 MM), ARISING IN A   . BREAST BIOPSY Left 09/19/2020   [place] bx 3 areas/ 1oc q clip/ 12 oc vision clip, axilla lymph node hydromark shape 3/ path pending  . BREAST LUMPECTOMY Right 08/22/2015  . BREAST LUMPECTOMY WITH SENTINEL LYMPH NODE BIOPSY Right 08/22/2015   Procedure: BREAST LUMPECTOMY WITH SENTINEL LYMPH NODE BX;  Surgeon: Christene Lye, MD;  Location: [facility];  Service: General;  Laterality: Right;  . COLONOSCOPY W/ POLYPECTOMY  2014  . COLONOSCOPY WITH PROPOFOL N/A 04/16/2019   Procedure: COLONOSCOPY WITH BIOPSIES;  Surgeon: Lucilla Lame, MD;  Location: [place];  Service: Endoscopy;  Laterality: N/A;  . DILATION AND CURETTAGE OF UTERUS     20 years ago  . NOVASURE ABLATION    . POLYPECTOMY N/A 04/16/2019   Procedure: POLYPECTOMY;  Surgeon: Lucilla Lame, MD;  Location: [place];  Service: Endoscopy;  Laterality: N/A;  . PORTACATH PLACEMENT Right 12/01/2020   Procedure: INSERTION PORT-A-CATH;  Surgeon: [name], MD;  Location: [facility];  Service: General;  Laterality:  Right;    Social History   Socioeconomic History  . Marital status: Married    Spouse name: Not on file  . Number of children: Not on file  . Years of education: Not on file  . Highest education level: Not on file  Occupational History  . Not on file  Tobacco Use  . Smoking status: Current Every Day Smoker    Packs/day: 0.50    Years: 40.00    Pack years: 20.00    Types: Cigarettes  . Smokeless tobacco: Never Used  Vaping Use  . Vaping Use: Never used  Substance and Sexual Activity  . Alcohol use: Yes    Alcohol/week: 2.0 standard drinks    Types: 2 Standard drinks or equivalent per week    Comment: BEER 1-2 QD  . Drug use: No  . Sexual activity: Yes    Comment: ablation  Other Topics Concern  . Not on file  Social History Narrative  . Not on file   Social Determinants of Health   Financial Resource Strain: Not on file  Food Insecurity: Not on file  Transportation Needs: Not on file  Physical Activity: Not on file  Stress: Not on file  Social Connections: Not on file  Intimate Partner Violence: Not on file    Family History  Problem Relation Age of Onset  .  Stroke Mother   . Cancer Sister        sarcoma on arm; chemo  . Diabetes Sister   . Stroke Sister   . Diabetes Brother   . Breast cancer Neg Hx   . Ovarian cancer Neg Hx   . Colon cancer Neg Hx   . Heart disease Neg Hx      Current Outpatient Medications:  .  allopurinol (ZYLOPRIM) 100 MG tablet, Take 100 mg by mouth at bedtime., Disp: , Rfl:  .  amLODipine (NORVASC) 10 MG tablet, Take 10 mg by mouth at bedtime. , Disp: , Rfl: 0 .  Calcium Carb-Cholecalciferol (CALCIUM 600+D3 PO), Take 1 tablet by mouth at bedtime., Disp: , Rfl:  .  lidocaine-prilocaine (EMLA) cream, Apply to affected area once, Disp: 30 g, Rfl: 3 .  losartan (COZAAR) 100 MG tablet, Take 100 mg by mouth at bedtime. , Disp: , Rfl: 0 .  metoprolol succinate (TOPROL-XL) 25 MG 24 hr tablet, Take 50 mg by mouth at bedtime., Disp: ,  Rfl: 0 .  potassium chloride SA (KLOR-CON) 20 MEQ tablet, TAKE 1 TABLET(20 MEQ) BY MOUTH DAILY, Disp: 30 tablet, Rfl: 0 .  PROVENTIL HFA 108 (90 Base) MCG/ACT inhaler, Inhale 2 puffs into the lungs every 6 (six) hours as needed (wheezing/shortness of breath.)., Disp: , Rfl:  .  dexamethasone (DECADRON) 4 MG tablet, Take 2 tablets (8 mg total) by mouth daily. Take daily for 3 days after chemo. Take with food. (Patient not taking: Reported on 02/26/2021), Disp: 30 tablet, Rfl: 1 .  HYDROcodone-acetaminophen (NORCO/VICODIN) 5-325 MG tablet, Take 1 tablet by mouth every 6 (six) hours as needed for moderate pain. (Patient not taking: Reported on 02/26/2021), Disp: 15 tablet, Rfl: 0 .  ibuprofen (ADVIL) 800 MG tablet, Take 1 tablet (800 mg total) by mouth every 8 (eight) hours as needed. (Patient not taking: No sig reported), Disp: 30 tablet, Rfl: 0 .  LORazepam (ATIVAN) 0.5 MG tablet, Take 1 tablet (0.5 mg total) by mouth every 6 (six) hours as needed (Nausea or vomiting). (Patient not taking: No sig reported), Disp: 30 tablet, Rfl: 0 .  ondansetron (ZOFRAN) 8 MG tablet, Take 1 tablet (8 mg total) by mouth 2 (two) times daily as needed. Start on the third day after chemotherapy. (Patient not taking: No sig reported), Disp: 30 tablet, Rfl: 1 .  prochlorperazine (COMPAZINE) 10 MG tablet, Take 1 tablet (10 mg total) by mouth every 6 (six) hours as needed (Nausea or vomiting). (Patient not taking: No sig reported), Disp: 30 tablet, Rfl: 1  Physical exam:  Vitals:   02/26/21 0833  BP: (!) 155/93  Pulse: 80  Resp: 16  Temp: 97.9 F (36.6 C)  Weight: 194 lb 11.2 oz (88.3 kg)   Physical Exam Constitutional:      General: She is not in acute distress. Cardiovascular:     Rate and Rhythm: Normal rate and regular rhythm.     Heart sounds: Normal heart sounds.  Pulmonary:     Effort: Pulmonary effort is normal.     Breath sounds: Normal breath sounds.  Abdominal:     General: Bowel sounds are normal.      Palpations: Abdomen is soft.  Skin:    General: Skin is warm and dry.     Comments: Hyperpigmentation of bilateral palms  Neurological:     Mental Status: She is alert and oriented to person, place, and time.   Breast exam: No palpable mass in the left breast.  No palpable left axillary adenopathy  CMP Latest Ref Rng & Units 02/26/2021  Glucose 70 - 99 mg/dL 94  BUN 6 - 20 mg/dL 19  Creatinine 0.44 - 1.00 mg/dL 0.65  Sodium 135 - 145 mmol/L 138  Potassium 3.5 - 5.1 mmol/L 3.5  Chloride 98 - 111 mmol/L 106  CO2 22 - 32 mmol/L 23  Calcium 8.9 - 10.3 mg/dL 9.4  Total Protein 6.5 - 8.1 g/dL 6.7  Total Bilirubin 0.3 - 1.2 mg/dL 0.5  Alkaline Phos 38 - 126 U/L 73  AST 15 - 41 U/L 17  ALT 0 - 44 U/L 12   CBC Latest Ref Rng & Units 02/26/2021  WBC 4.0 - 10.5 K/uL 4.6  Hemoglobin 12.0 - 15.0 g/dL 11.2(L)  Hematocrit 36.0 - 46.0 % 35.8(L)  Platelets 150 - 400 K/uL 192     Assessment and plan- Patient is a 60 y.o. female prior history of right breast cancer s/p lumpectomy now with anatomical stage IIAleft breast invasive mammary carcinoma [id number] ER/PR positive and HER-2 negative.she is s/p 4 cycles of dose dense AC chemotherapy.   She is here for on treatment assessment prior to cycle 5 of weekly Taxol chemotherapy  Counts okay to proceed with cycle 5 of weekly Taxol chemotherapy today.  She will directly proceed for cycle 6 next week and I will see her back in 2 weeks for cycle 7.  Plan is to complete 12 weekly cycles of Taxol chemotherapy followed by definitive surgery. Given that she had at least 6 discrete hypoechoic masses in the left breast she would need a mastectomy with sentinel lymph node biopsy and removal of the biopsy-proven left axillary lymph node.  Chemo induced hyperpigmentation of bilateral palms: Continue topical emollient   Visit Diagnosis 1. Encounter for antineoplastic chemotherapy   2. Malignant neoplasm of upper-outer quadrant of right breast in female,  estrogen receptor positive ([place])      Dr. [name], MD, MPH [facility] [phone] 02/26/2021 11:55 AM

## 2021-02-26 NOTE — Patient Instructions (Signed)
CANCER CENTER [place] [facility]    Discharge Instructions: Thank you for choosing [facility] to provide your oncology and hematology care.  If you have a lab appointment with the Cancer Center, please go directly to the Cancer Center and check in at the registration area.  Wear comfortable clothing and clothing appropriate for easy access to any Portacath or PICC line.   We strive to give you quality time with your provider. You may need to reschedule your appointment if you arrive late (15 or more minutes).  Arriving late affects you and other patients whose appointments are after yours.  Also, if you miss three or more appointments without notifying the office, you may be dismissed from the clinic at the provider's discretion.      For prescription refill requests, have your pharmacy contact our office and allow 72 hours for refills to be completed.    Today you received the following chemotherapy and/or immunotherapy agents - Taxol   To help prevent nausea and vomiting after your treatment, we encourage you to take your nausea medication as directed.  BELOW ARE SYMPTOMS THAT SHOULD BE REPORTED IMMEDIATELY: . *FEVER GREATER THAN 100.4 F (38 C) OR HIGHER . *CHILLS OR SWEATING . *NAUSEA AND VOMITING THAT IS NOT CONTROLLED WITH YOUR NAUSEA MEDICATION . *UNUSUAL SHORTNESS OF BREATH . *UNUSUAL BRUISING OR BLEEDING . *URINARY PROBLEMS (pain or burning when urinating, or frequent urination) . *BOWEL PROBLEMS (unusual diarrhea, constipation, pain near the anus) . TENDERNESS IN MOUTH AND THROAT WITH OR WITHOUT PRESENCE OF ULCERS (sore throat, sores in mouth, or a toothache) . UNUSUAL RASH, SWELLING OR PAIN  . UNUSUAL VAGINAL DISCHARGE OR ITCHING   Items with * indicate a potential emergency and should be followed up as soon as possible or go to the Emergency Department if any problems should occur.  Please show the CHEMOTHERAPY ALERT CARD or IMMUNOTHERAPY ALERT  CARD at check-in to the Emergency Department and triage nurse.  Should you have questions after your visit or need to cancel or reschedule your appointment, please contact CANCER CENTER  [facility]  [phone] and follow the prompts.  Office hours are 8:00 a.m. to 4:30 p.m. Monday - Friday. Please note that voicemails left after 4:00 p.m. may not be returned until the following business day.  We are closed weekends and major holidays. You have access to a nurse at all times for urgent questions. Please call the main number to the clinic [phone] and follow the prompts.  For any non-urgent questions, you may also contact your provider using MyChart. We now offer e-Visits for anyone 18 and older to request care online for non-urgent symptoms. For details visit [email or web address].   Also download the MyChart app! Go to the app store, search "MyChart", open the app, select [facility], and log in with your MyChart username and password.  Due to Covid, a mask is required upon entering the hospital/clinic. If you do not have a mask, one will be given to you upon arrival. For doctor visits, patients may have 1 support person aged 18 or older with them. For treatment visits, patients cannot have anyone with them due to current Covid guidelines and our immunocompromised population.   Paclitaxel injection What is this medicine? PACLITAXEL (PAK li TAX el) is a chemotherapy drug. It targets fast dividing cells, like cancer cells, and causes these cells to die. This medicine is used to treat ovarian cancer, breast cancer, lung cancer, Kaposi's sarcoma, and other   cancers. This medicine may be used for other purposes; ask your health care provider or pharmacist if you have questions. COMMON BRAND NAME(S): Onxol, Taxol What should I tell my health care provider before I take this medicine? They need to know if you have any of these conditions:  history of irregular  heartbeat  liver disease  low blood counts, like low white cell, platelet, or red cell counts  lung or breathing disease, like asthma  tingling of the fingers or toes, or other nerve disorder  an unusual or allergic reaction to paclitaxel, alcohol, polyoxyethylated castor oil, other chemotherapy, other medicines, foods, dyes, or preservatives  pregnant or trying to get pregnant  breast-feeding How should I use this medicine? This drug is given as an infusion into a vein. It is administered in a hospital or clinic by a specially trained health care professional. Talk to your pediatrician regarding the use of this medicine in children. Special care may be needed. Overdosage: If you think you have taken too much of this medicine contact a poison control center or emergency room at once. NOTE: This medicine is only for you. Do not share this medicine with others. What if I miss a dose? It is important not to miss your dose. Call your doctor or health care professional if you are unable to keep an appointment. What may interact with this medicine? Do not take this medicine with any of the following medications:  live virus vaccines This medicine may also interact with the following medications:  antiviral medicines for hepatitis, HIV or AIDS  certain antibiotics like erythromycin and clarithromycin  certain medicines for fungal infections like ketoconazole and itraconazole  certain medicines for seizures like carbamazepine, phenobarbital, phenytoin  gemfibrozil  nefazodone  rifampin  St. John's wort This list may not describe all possible interactions. Give your health care provider a list of all the medicines, herbs, non-prescription drugs, or dietary supplements you use. Also tell them if you smoke, drink alcohol, or use illegal drugs. Some items may interact with your medicine. What should I watch for while using this medicine? Your condition will be monitored carefully  while you are receiving this medicine. You will need important blood work done while you are taking this medicine. This medicine can cause serious allergic reactions. To reduce your risk you will need to take other medicine(s) before treatment with this medicine. If you experience allergic reactions like skin rash, itching or hives, swelling of the face, lips, or tongue, tell your doctor or health care professional right away. In some cases, you may be given additional medicines to help with side effects. Follow all directions for their use. This drug may make you feel generally unwell. This is not uncommon, as chemotherapy can affect healthy cells as well as cancer cells. Report any side effects. Continue your course of treatment even though you feel ill unless your doctor tells you to stop. Call your doctor or health care professional for advice if you get a fever, chills or sore throat, or other symptoms of a cold or flu. Do not treat yourself. This drug decreases your body's ability to fight infections. Try to avoid being around people who are sick. This medicine may increase your risk to bruise or bleed. Call your doctor or health care professional if you notice any unusual bleeding. Be careful brushing and flossing your teeth or using a toothpick because you may get an infection or bleed more easily. If you have any dental work done, tell  your dentist you are receiving this medicine. Avoid taking products that contain aspirin, acetaminophen, ibuprofen, naproxen, or ketoprofen unless instructed by your doctor. These medicines may hide a fever. Do not become pregnant while taking this medicine. Women should inform their doctor if they wish to become pregnant or think they might be pregnant. There is a potential for serious side effects to an unborn child. Talk to your health care professional or pharmacist for more information. Do not breast-feed an infant while taking this medicine. Men are advised not  to father a child while receiving this medicine. This product may contain alcohol. Ask your pharmacist or healthcare provider if this medicine contains alcohol. Be sure to tell all healthcare providers you are taking this medicine. Certain medicines, like metronidazole and disulfiram, can cause an unpleasant reaction when taken with alcohol. The reaction includes flushing, headache, nausea, vomiting, sweating, and increased thirst. The reaction can last from 30 minutes to several hours. What side effects may I notice from receiving this medicine? Side effects that you should report to your doctor or health care professional as soon as possible:  allergic reactions like skin rash, itching or hives, swelling of the face, lips, or tongue  breathing problems  changes in vision  fast, irregular heartbeat  high or low blood pressure  mouth sores  pain, tingling, numbness in the hands or feet  signs of decreased platelets or bleeding - bruising, pinpoint red spots on the skin, black, tarry stools, blood in the urine  signs of decreased red blood cells - unusually weak or tired, feeling faint or lightheaded, falls  signs of infection - fever or chills, cough, sore throat, pain or difficulty passing urine  signs and symptoms of liver injury like dark yellow or brown urine; general ill feeling or flu-like symptoms; light-colored stools; loss of appetite; nausea; right upper belly pain; unusually weak or tired; yellowing of the eyes or skin  swelling of the ankles, feet, hands  unusually slow heartbeat Side effects that usually do not require medical attention (report to your doctor or health care professional if they continue or are bothersome):  diarrhea  hair loss  loss of appetite  muscle or joint pain  nausea, vomiting  pain, redness, or irritation at site where injected  tiredness This list may not describe all possible side effects. Call your doctor for medical advice about  side effects. You may report side effects to FDA at 1-800-FDA-1088. Where should I keep my medicine? This drug is given in a hospital or clinic and will not be stored at home. NOTE: This sheet is a summary. It may not cover all possible information. If you have questions about this medicine, talk to your doctor, pharmacist, or health care provider.  2021 Elsevier/Gold Standard (2019-09-19 13:37:23)

## 2021-02-26 NOTE — Progress Notes (Signed)
Pt had some stress today from leaking tire. It got her behind. No concerns

## 2021-03-05 ENCOUNTER — Other Ambulatory Visit: Payer: Self-pay

## 2021-03-05 ENCOUNTER — Inpatient Hospital Stay: Payer: 59 | Attending: Oncology

## 2021-03-05 ENCOUNTER — Inpatient Hospital Stay: Payer: 59

## 2021-03-05 VITALS — BP 139/90 | HR 105

## 2021-03-05 VITALS — BP 171/99 | HR 101 | Temp 97.5°F | Resp 18 | Wt 193.4 lb

## 2021-03-05 DIAGNOSIS — F17210 Nicotine dependence, cigarettes, uncomplicated: Secondary | ICD-10-CM | POA: Insufficient documentation

## 2021-03-05 DIAGNOSIS — Z79811 Long term (current) use of aromatase inhibitors: Secondary | ICD-10-CM | POA: Diagnosis not present

## 2021-03-05 DIAGNOSIS — C50812 Malignant neoplasm of overlapping sites of left female breast: Secondary | ICD-10-CM

## 2021-03-05 DIAGNOSIS — D649 Anemia, unspecified: Secondary | ICD-10-CM | POA: Insufficient documentation

## 2021-03-05 DIAGNOSIS — T451X5A Adverse effect of antineoplastic and immunosuppressive drugs, initial encounter: Secondary | ICD-10-CM | POA: Insufficient documentation

## 2021-03-05 DIAGNOSIS — G620 Drug-induced polyneuropathy: Secondary | ICD-10-CM | POA: Insufficient documentation

## 2021-03-05 DIAGNOSIS — C50411 Malignant neoplasm of upper-outer quadrant of right female breast: Secondary | ICD-10-CM | POA: Diagnosis present

## 2021-03-05 DIAGNOSIS — Z5111 Encounter for antineoplastic chemotherapy: Secondary | ICD-10-CM | POA: Insufficient documentation

## 2021-03-05 DIAGNOSIS — Z170 Estrogen receptor positive status [ER+]: Secondary | ICD-10-CM

## 2021-03-05 LAB — CBC WITH DIFFERENTIAL/PLATELET
Abs Immature Granulocytes: 0.04 10*3/uL (ref 0.00–0.07)
Basophils Absolute: 0 10*3/uL (ref 0.0–0.1)
Basophils Relative: 1 %
Eosinophils Absolute: 0.1 10*3/uL (ref 0.0–0.5)
Eosinophils Relative: 3 %
HCT: 34.5 % — ABNORMAL LOW (ref 36.0–46.0)
Hemoglobin: 11.1 g/dL — ABNORMAL LOW (ref 12.0–15.0)
Immature Granulocytes: 1 %
Lymphocytes Relative: 18 %
Lymphs Abs: 0.7 10*3/uL (ref 0.7–4.0)
MCH: 28.1 pg (ref 26.0–34.0)
MCHC: 32.2 g/dL (ref 30.0–36.0)
MCV: 87.3 fL (ref 80.0–100.0)
Monocytes Absolute: 0.4 10*3/uL (ref 0.1–1.0)
Monocytes Relative: 9 %
Neutro Abs: 2.8 10*3/uL (ref 1.7–7.7)
Neutrophils Relative %: 68 %
Platelets: 206 10*3/uL (ref 150–400)
RBC: 3.95 MIL/uL (ref 3.87–5.11)
RDW: 17.1 % — ABNORMAL HIGH (ref 11.5–15.5)
WBC: 4.1 10*3/uL (ref 4.0–10.5)
nRBC: 0 % (ref 0.0–0.2)

## 2021-03-05 LAB — COMPREHENSIVE METABOLIC PANEL
ALT: 11 U/L (ref 0–44)
AST: 19 U/L (ref 15–41)
Albumin: 3.7 g/dL (ref 3.5–5.0)
Alkaline Phosphatase: 54 U/L (ref 38–126)
Anion gap: 9 (ref 5–15)
BUN: 14 mg/dL (ref 6–20)
CO2: 23 mmol/L (ref 22–32)
Calcium: 9.3 mg/dL (ref 8.9–10.3)
Chloride: 106 mmol/L (ref 98–111)
Creatinine, Ser: 0.64 mg/dL (ref 0.44–1.00)
GFR, Estimated: >60 mL/min (ref >60)
Glucose, Bld: 89 mg/dL (ref 70–99)
Potassium: 3.3 mmol/L — ABNORMAL LOW (ref 3.5–5.1)
Sodium: 138 mmol/L (ref 135–145)
Total Bilirubin: 0.5 mg/dL (ref 0.3–1.2)
Total Protein: 6.7 g/dL (ref 6.5–8.1)

## 2021-03-05 MED ORDER — FAMOTIDINE 20 MG IN NS 100 ML IVPB
20.0000 mg | Freq: Once | INTRAVENOUS | Status: AC
  Administered 2021-03-05: 20 mg via INTRAVENOUS
  Filled 2021-03-05: qty 100
  Filled 2021-03-05: qty 20

## 2021-03-05 MED ORDER — HEPARIN SOD (PORK) LOCK FLUSH 100 UNIT/ML IV SOLN
INTRAVENOUS | Status: AC
  Filled 2021-03-05: qty 5

## 2021-03-05 MED ORDER — SODIUM CHLORIDE 0.9 % IV SOLN
Freq: Once | INTRAVENOUS | Status: AC
  Filled 2021-03-05: qty 250

## 2021-03-05 MED ORDER — SODIUM CHLORIDE 0.9 % IV SOLN
20.0000 mg | Freq: Once | INTRAVENOUS | Status: AC
  Administered 2021-03-05: 20 mg via INTRAVENOUS
  Filled 2021-03-05: qty 20

## 2021-03-05 MED ORDER — SODIUM CHLORIDE 0.9 % IV SOLN
65.0000 mg/m2 | Freq: Once | INTRAVENOUS | Status: AC
  Administered 2021-03-05: 132 mg via INTRAVENOUS
  Filled 2021-03-05: qty 22

## 2021-03-05 MED ORDER — DIPHENHYDRAMINE HCL 50 MG/ML IJ SOLN
50.0000 mg | Freq: Once | INTRAMUSCULAR | Status: AC
  Administered 2021-03-05: 50 mg via INTRAVENOUS
  Filled 2021-03-05: qty 1

## 2021-03-05 MED ORDER — HEPARIN SOD (PORK) LOCK FLUSH 100 UNIT/ML IV SOLN
500.0000 [IU] | Freq: Once | INTRAVENOUS | Status: AC | PRN
  Administered 2021-03-05: 500 [IU]
  Filled 2021-03-05: qty 5

## 2021-03-05 NOTE — Patient Instructions (Signed)
[facility] [place] [facility]    Discharge Instructions: Thank you for choosing [facility] to provide your oncology and hematology care.  If you have a lab appointment with the Cancer Center, please go directly to the Cancer Center and check in at the registration area.  Wear comfortable clothing and clothing appropriate for easy access to any Portacath or PICC line.   We strive to give you quality time with your provider. You may need to reschedule your appointment if you arrive late (15 or more minutes).  Arriving late affects you and other patients whose appointments are after yours.  Also, if you miss three or more appointments without notifying the office, you may be dismissed from the clinic at the provider's discretion.      For prescription refill requests, have your pharmacy contact our office and allow 72 hours for refills to be completed.    Today you received the following chemotherapy and/or immunotherapy agents - Taxol   To help prevent nausea and vomiting after your treatment, we encourage you to take your nausea medication as directed.  BELOW ARE SYMPTOMS THAT SHOULD BE REPORTED IMMEDIATELY: . *FEVER GREATER THAN 100.4 F (38 C) OR HIGHER . *CHILLS OR SWEATING . *NAUSEA AND VOMITING THAT IS NOT CONTROLLED WITH YOUR NAUSEA MEDICATION . *UNUSUAL SHORTNESS OF BREATH . *UNUSUAL BRUISING OR BLEEDING . *URINARY PROBLEMS (pain or burning when urinating, or frequent urination) . *BOWEL PROBLEMS (unusual diarrhea, constipation, pain near the anus) . TENDERNESS IN MOUTH AND THROAT WITH OR WITHOUT PRESENCE OF ULCERS (sore throat, sores in mouth, or a toothache) . UNUSUAL RASH, SWELLING OR PAIN  . UNUSUAL VAGINAL DISCHARGE OR ITCHING   Items with * indicate a potential emergency and should be followed up as soon as possible or go to the Emergency Department if any problems should occur.  Please show the CHEMOTHERAPY ALERT CARD or IMMUNOTHERAPY ALERT  CARD at check-in to the Emergency Department and triage nurse.  Should you have questions after your visit or need to cancel or reschedule your appointment, please contact [facility] [place] [facility]  [phone] and follow the prompts.  Office hours are 8:00 a.m. to 4:30 p.m. Monday - Friday. Please note that voicemails left after 4:00 p.m. may not be returned until the following business day.  We are closed weekends and major holidays. You have access to a nurse at all times for urgent questions. Please call the main number to the clinic [phone] and follow the prompts.  For any non-urgent questions, you may also contact your provider using MyChart. We now offer e-Visits for anyone 18 and older to request care online for non-urgent symptoms. For details visit [email or web address].   Also download the MyChart app! Go to the app store, search "MyChart", open the app, select [facility], and log in with your MyChart username and password.  Due to Covid, a mask is required upon entering the hospital/clinic. If you do not have a mask, one will be given to you upon arrival. For doctor visits, patients may have 1 support person aged 18 or older with them. For treatment visits, patients cannot have anyone with them due to current Covid guidelines and our immunocompromised population.   Paclitaxel injection What is this medicine? PACLITAXEL (PAK li TAX el) is a chemotherapy drug. It targets fast dividing cells, like cancer cells, and causes these cells to die. This medicine is used to treat ovarian cancer, breast cancer, lung cancer, Kaposi's sarcoma, and other   cancers. This medicine may be used for other purposes; ask your health care provider or pharmacist if you have questions. COMMON BRAND NAME(S): Onxol, Taxol What should I tell my health care provider before I take this medicine? They need to know if you have any of these conditions:  history of irregular  heartbeat  liver disease  low blood counts, like low white cell, platelet, or red cell counts  lung or breathing disease, like asthma  tingling of the fingers or toes, or other nerve disorder  an unusual or allergic reaction to paclitaxel, alcohol, polyoxyethylated castor oil, other chemotherapy, other medicines, foods, dyes, or preservatives  pregnant or trying to get pregnant  breast-feeding How should I use this medicine? This drug is given as an infusion into a vein. It is administered in a hospital or clinic by a specially trained health care professional. Talk to your pediatrician regarding the use of this medicine in children. Special care may be needed. Overdosage: If you think you have taken too much of this medicine contact a poison control center or emergency room at once. NOTE: This medicine is only for you. Do not share this medicine with others. What if I miss a dose? It is important not to miss your dose. Call your doctor or health care professional if you are unable to keep an appointment. What may interact with this medicine? Do not take this medicine with any of the following medications:  live virus vaccines This medicine may also interact with the following medications:  antiviral medicines for hepatitis, HIV or AIDS  certain antibiotics like erythromycin and clarithromycin  certain medicines for fungal infections like ketoconazole and itraconazole  certain medicines for seizures like carbamazepine, phenobarbital, phenytoin  gemfibrozil  nefazodone  rifampin  St. John's wort This list may not describe all possible interactions. Give your health care provider a list of all the medicines, herbs, non-prescription drugs, or dietary supplements you use. Also tell them if you smoke, drink alcohol, or use illegal drugs. Some items may interact with your medicine. What should I watch for while using this medicine? Your condition will be monitored carefully  while you are receiving this medicine. You will need important blood work done while you are taking this medicine. This medicine can cause serious allergic reactions. To reduce your risk you will need to take other medicine(s) before treatment with this medicine. If you experience allergic reactions like skin rash, itching or hives, swelling of the face, lips, or tongue, tell your doctor or health care professional right away. In some cases, you may be given additional medicines to help with side effects. Follow all directions for their use. This drug may make you feel generally unwell. This is not uncommon, as chemotherapy can affect healthy cells as well as cancer cells. Report any side effects. Continue your course of treatment even though you feel ill unless your doctor tells you to stop. Call your doctor or health care professional for advice if you get a fever, chills or sore throat, or other symptoms of a cold or flu. Do not treat yourself. This drug decreases your body's ability to fight infections. Try to avoid being around people who are sick. This medicine may increase your risk to bruise or bleed. Call your doctor or health care professional if you notice any unusual bleeding. Be careful brushing and flossing your teeth or using a toothpick because you may get an infection or bleed more easily. If you have any dental work done, tell   your dentist you are receiving this medicine. Avoid taking products that contain aspirin, acetaminophen, ibuprofen, naproxen, or ketoprofen unless instructed by your doctor. These medicines may hide a fever. Do not become pregnant while taking this medicine. Women should inform their doctor if they wish to become pregnant or think they might be pregnant. There is a potential for serious side effects to an unborn child. Talk to your health care professional or pharmacist for more information. Do not breast-feed an infant while taking this medicine. Men are advised not  to father a child while receiving this medicine. This product may contain alcohol. Ask your pharmacist or healthcare provider if this medicine contains alcohol. Be sure to tell all healthcare providers you are taking this medicine. Certain medicines, like metronidazole and disulfiram, can cause an unpleasant reaction when taken with alcohol. The reaction includes flushing, headache, nausea, vomiting, sweating, and increased thirst. The reaction can last from 30 minutes to several hours. What side effects may I notice from receiving this medicine? Side effects that you should report to your doctor or health care professional as soon as possible:  allergic reactions like skin rash, itching or hives, swelling of the face, lips, or tongue  breathing problems  changes in vision  fast, irregular heartbeat  high or low blood pressure  mouth sores  pain, tingling, numbness in the hands or feet  signs of decreased platelets or bleeding - bruising, pinpoint red spots on the skin, black, tarry stools, blood in the urine  signs of decreased red blood cells - unusually weak or tired, feeling faint or lightheaded, falls  signs of infection - fever or chills, cough, sore throat, pain or difficulty passing urine  signs and symptoms of liver injury like dark yellow or brown urine; general ill feeling or flu-like symptoms; light-colored stools; loss of appetite; nausea; right upper belly pain; unusually weak or tired; yellowing of the eyes or skin  swelling of the ankles, feet, hands  unusually slow heartbeat Side effects that usually do not require medical attention (report to your doctor or health care professional if they continue or are bothersome):  diarrhea  hair loss  loss of appetite  muscle or joint pain  nausea, vomiting  pain, redness, or irritation at site where injected  tiredness This list may not describe all possible side effects. Call your doctor for medical advice about  side effects. You may report side effects to FDA at 1-800-FDA-1088. Where should I keep my medicine? This drug is given in a hospital or clinic and will not be stored at home. NOTE: This sheet is a summary. It may not cover all possible information. If you have questions about this medicine, talk to your doctor, pharmacist, or health care provider.  2021 Elsevier/Gold Standard (2019-09-19 13:37:23)  

## 2021-03-07 ENCOUNTER — Other Ambulatory Visit: Payer: Self-pay | Admitting: Nurse Practitioner

## 2021-03-12 ENCOUNTER — Inpatient Hospital Stay (HOSPITAL_BASED_OUTPATIENT_CLINIC_OR_DEPARTMENT_OTHER): Payer: 59 | Admitting: Oncology

## 2021-03-12 ENCOUNTER — Other Ambulatory Visit: Payer: Self-pay

## 2021-03-12 ENCOUNTER — Encounter: Payer: Self-pay | Admitting: Oncology

## 2021-03-12 ENCOUNTER — Inpatient Hospital Stay: Payer: 59

## 2021-03-12 VITALS — BP 157/88 | HR 98 | Resp 20

## 2021-03-12 VITALS — BP 151/101 | HR 91 | Temp 97.4°F | Resp 18 | Wt 194.0 lb

## 2021-03-12 DIAGNOSIS — G620 Drug-induced polyneuropathy: Secondary | ICD-10-CM | POA: Diagnosis not present

## 2021-03-12 DIAGNOSIS — C50411 Malignant neoplasm of upper-outer quadrant of right female breast: Secondary | ICD-10-CM | POA: Diagnosis not present

## 2021-03-12 DIAGNOSIS — Z170 Estrogen receptor positive status [ER+]: Secondary | ICD-10-CM

## 2021-03-12 DIAGNOSIS — C50812 Malignant neoplasm of overlapping sites of left female breast: Secondary | ICD-10-CM

## 2021-03-12 DIAGNOSIS — T451X5A Adverse effect of antineoplastic and immunosuppressive drugs, initial encounter: Secondary | ICD-10-CM

## 2021-03-12 DIAGNOSIS — Z5111 Encounter for antineoplastic chemotherapy: Secondary | ICD-10-CM

## 2021-03-12 LAB — CBC WITH DIFFERENTIAL/PLATELET
Abs Immature Granulocytes: 0.04 10*3/uL (ref 0.00–0.07)
Basophils Absolute: 0 10*3/uL (ref 0.0–0.1)
Basophils Relative: 1 %
Eosinophils Absolute: 0.1 10*3/uL (ref 0.0–0.5)
Eosinophils Relative: 2 %
HCT: 34.9 % — ABNORMAL LOW (ref 36.0–46.0)
Hemoglobin: 11.2 g/dL — ABNORMAL LOW (ref 12.0–15.0)
Immature Granulocytes: 1 %
Lymphocytes Relative: 30 %
Lymphs Abs: 1.3 10*3/uL (ref 0.7–4.0)
MCH: 28.1 pg (ref 26.0–34.0)
MCHC: 32.1 g/dL (ref 30.0–36.0)
MCV: 87.5 fL (ref 80.0–100.0)
Monocytes Absolute: 0.6 10*3/uL (ref 0.1–1.0)
Monocytes Relative: 13 %
Neutro Abs: 2.3 10*3/uL (ref 1.7–7.7)
Neutrophils Relative %: 53 %
Platelets: 223 10*3/uL (ref 150–400)
RBC: 3.99 MIL/uL (ref 3.87–5.11)
RDW: 16.6 % — ABNORMAL HIGH (ref 11.5–15.5)
Smear Review: NORMAL
WBC: 4.3 10*3/uL (ref 4.0–10.5)
nRBC: 0 % (ref 0.0–0.2)

## 2021-03-12 LAB — COMPREHENSIVE METABOLIC PANEL
ALT: 14 U/L (ref 0–44)
AST: 20 U/L (ref 15–41)
Albumin: 3.5 g/dL (ref 3.5–5.0)
Alkaline Phosphatase: 70 U/L (ref 38–126)
Anion gap: 10 (ref 5–15)
BUN: 10 mg/dL (ref 6–20)
CO2: 23 mmol/L (ref 22–32)
Calcium: 8.9 mg/dL (ref 8.9–10.3)
Chloride: 105 mmol/L (ref 98–111)
Creatinine, Ser: 0.72 mg/dL (ref 0.44–1.00)
GFR, Estimated: >60 mL/min (ref >60)
Glucose, Bld: 97 mg/dL (ref 70–99)
Potassium: 3.5 mmol/L (ref 3.5–5.1)
Sodium: 138 mmol/L (ref 135–145)
Total Bilirubin: 0.5 mg/dL (ref 0.3–1.2)
Total Protein: 6.4 g/dL — ABNORMAL LOW (ref 6.5–8.1)

## 2021-03-12 MED ORDER — SODIUM CHLORIDE 0.9 % IV SOLN
20.0000 mg | Freq: Once | INTRAVENOUS | Status: AC
  Administered 2021-03-12: 20 mg via INTRAVENOUS
  Filled 2021-03-12: qty 20

## 2021-03-12 MED ORDER — FAMOTIDINE 20 MG IN NS 100 ML IVPB
20.0000 mg | Freq: Once | INTRAVENOUS | Status: AC
  Administered 2021-03-12: 20 mg via INTRAVENOUS
  Filled 2021-03-12: qty 100
  Filled 2021-03-12: qty 20

## 2021-03-12 MED ORDER — HEPARIN SOD (PORK) LOCK FLUSH 100 UNIT/ML IV SOLN
500.0000 [IU] | Freq: Once | INTRAVENOUS | Status: AC | PRN
  Administered 2021-03-12: 500 [IU]
  Filled 2021-03-12: qty 5

## 2021-03-12 MED ORDER — DIPHENHYDRAMINE HCL 50 MG/ML IJ SOLN
50.0000 mg | Freq: Once | INTRAMUSCULAR | Status: AC
  Administered 2021-03-12: 50 mg via INTRAVENOUS
  Filled 2021-03-12: qty 1

## 2021-03-12 MED ORDER — SODIUM CHLORIDE 0.9 % IV SOLN
Freq: Once | INTRAVENOUS | Status: AC
  Filled 2021-03-12: qty 250

## 2021-03-12 MED ORDER — HEPARIN SOD (PORK) LOCK FLUSH 100 UNIT/ML IV SOLN
INTRAVENOUS | Status: AC
  Filled 2021-03-12: qty 5

## 2021-03-12 MED ORDER — SODIUM CHLORIDE 0.9 % IV SOLN
65.0000 mg/m2 | Freq: Once | INTRAVENOUS | Status: AC
  Administered 2021-03-12: 132 mg via INTRAVENOUS
  Filled 2021-03-12: qty 22

## 2021-03-12 NOTE — Progress Notes (Signed)
   Hematology/Oncology Consult note [facility]  [phone] [phone]  Patient Care Team: Jadali, Fayegh, MD as PCP - General (Internal Medicine) Jadali, Fayegh, MD as Consulting Physician (Internal Medicine) Sankar, Seeplaputhur G, MD (General Surgery) Hyatt, Max T, DPM as Consulting Physician (Podiatry) Rao, Archana C, MD as Consulting Physician (Hematology and Oncology)   Name of the patient: Michelle Walker  [id number]  06/13/1961   Date of visit: 03/12/21  Diagnosis- recurrent left breast cancer stage II [id number] ER/PR positive HER-2 negative  Chief complaint/ Reason for visit-on treatment assessment prior to cycle 7 of weekly Taxol chemotherapy  Heme/Onc history: Patient is a 59-year-old female who was diagnosed with 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 She did not require adjuvant chemotherapy. She was initially on tamoxifen which she could not tolerate and was switched to letrozole. Last seen by me in June 2019 at which point she had a menstrual cycle and therefore not given another AI. She had subsequently did not follow-up with me and remained off hormone therapy.   More recently patient underwent a diagnostic bilateral mammogram which showed a 2.2 x 1.9 x 1.9 cm mass at the 12 o'clock position of the left breast 1 cm from the nipple. She was also found to have 5 other smaller breast masses ranging from 0.3 to 0.8 cm. Noted to have a solitary left axillary lymph node with cortical thickening. She had a biopsy of the dominant left breast mass as well as another smaller breast mass and the left axillary lymph node all of which were positive for metastatic invasive mammary carcinoma grade 2. Tumor was ER 91 200% positive PR 71 to 80% positive and HER-2 IHC equivocalbut negative by FISH  MRI of the bilateral breasts showed no abnormal findings in the  right breast. At least 6 discrete hypoechoic masses in the left breast with the largest one measuring 2.2 cm with 1 abnormal left axillary lymph node. The size of the other breast masses ranging from 4mm to 1.4 cm. The total area of masses and non-mass enhancement was about 10 x 5 x 6.5 cm.CT chest abdomen and pelvis with contrast showed no evidence of distant metastatic disease. Bone scan was also negative.  MammaPrint done on the biopsy specimen came back as high risk with greater than 12% chemotherapy benefit.Plan is for neoadjuvant dose dense AC-Taxol chemotherapy followed by surgery.Start of chemotherapy was delayed since patient tested positive for Covid  Interim scans after 4 cycles of dose dense AC chemotherapy showed response to treatment  Interval history-patient reports that her neuropathy is overall stable.  Discoloration in her bilateral hands is slowly getting better.  Nail discoloration persists  ECOG PS- 1 Pain scale- 0   Review of systems- Review of Systems  Constitutional: Positive for malaise/fatigue. Negative for chills, fever and weight loss.  HENT: Negative for congestion, ear discharge and nosebleeds.   Eyes: Negative for blurred vision.  Respiratory: Negative for cough, hemoptysis, sputum production, shortness of breath and wheezing.   Cardiovascular: Negative for chest pain, palpitations, orthopnea and claudication.  Gastrointestinal: Negative for abdominal pain, blood in stool, constipation, diarrhea, heartburn, melena, nausea and vomiting.  Genitourinary: Negative for dysuria, flank pain, frequency, hematuria and urgency.  Musculoskeletal: Negative for back pain, joint pain and myalgias.  Skin: Negative for rash.  Neurological: Positive for sensory change (Peripheral neuropathy). Negative for dizziness, tingling, focal weakness, seizures, weakness and headaches.  Endo/Heme/Allergies: Does   not bruise/bleed easily.  Psychiatric/Behavioral: Negative for  depression and suicidal ideas. The patient does not have insomnia.       Allergies  Allergen Reactions  . Triamcinolone Other (See Comments)    Hypopigmentation s/p steroid injection  . Tape Rash    SURGICAL TAPE AFTER BREAST BIOPSY     Past Medical History:  Diagnosis Date  . Anemia   . Arthritis   . 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 Wide excision/ RT, T1a,N0. ER/PR pos, Her 2.neg. Tamoxifen x 6 months, d/c secondary to vasomotor sympotms.   . Headache   . Hypertension   . Personal history of radiation therapy 2016   RIGHT lumpectomy  . Thyroid disease      Past Surgical History:  Procedure Laterality Date  . ANTERIOR CRUCIATE LIGAMENT REPAIR Right   . BREAST BIOPSY Right 07/17/2015   INVASIVE LOBULAR CARCINOMA, CLASSIC TYPE (1.5 MM), ARISING IN A   . BREAST BIOPSY Left 09/19/2020   us bx 3 areas/ 1oc q clip/ 12 oc vision clip, axilla lymph node hydromark shape 3/ path pending  . BREAST LUMPECTOMY Right 08/22/2015  . BREAST LUMPECTOMY WITH SENTINEL LYMPH NODE BIOPSY Right 08/22/2015   Procedure: BREAST LUMPECTOMY WITH SENTINEL LYMPH NODE BX;  Surgeon: Seeplaputhur G Sankar, MD;  Location: [facility];  Service: General;  Laterality: Right;  . COLONOSCOPY W/ POLYPECTOMY  2014  . COLONOSCOPY WITH PROPOFOL N/A 04/16/2019   Procedure: COLONOSCOPY WITH BIOPSIES;  Surgeon: Wohl, Darren, MD;  Location: [facility];  Service: Endoscopy;  Laterality: N/A;  . DILATION AND CURETTAGE OF UTERUS     20 years ago  . NOVASURE ABLATION    . POLYPECTOMY N/A 04/16/2019   Procedure: POLYPECTOMY;  Surgeon: Wohl, Darren, MD;  Location: [facility];  Service: Endoscopy;  Laterality: N/A;  . PORTACATH PLACEMENT Right 12/01/2020   Procedure: INSERTION PORT-A-CATH;  Surgeon: Rodenberg, Denny, MD;  Location: [facility];  Service: General;  Laterality: Right;    Social History   Socioeconomic History  . Marital status: Married     Spouse name: Not on file  . Number of children: Not on file  . Years of education: Not on file  . Highest education level: Not on file  Occupational History  . Not on file  Tobacco Use  . Smoking status: Current Every Day Smoker    Packs/day: 0.50    Years: 40.00    Pack years: 20.00    Types: Cigarettes  . Smokeless tobacco: Never Used  Vaping Use  . Vaping Use: Never used  Substance and Sexual Activity  . Alcohol use: Yes    Alcohol/week: 2.0 standard drinks    Types: 2 Standard drinks or equivalent per week    Comment: BEER 1-2 QD  . Drug use: No  . Sexual activity: Yes    Comment: ablation  Other Topics Concern  . Not on file  Social History Narrative  . Not on file   Social Determinants of Health   Financial Resource Strain: Not on file  Food Insecurity: Not on file  Transportation Needs: Not on file  Physical Activity: Not on file  Stress: Not on file  Social Connections: Not on file  Intimate Partner Violence: Not on file    Family History  Problem Relation Age of Onset  . Stroke Mother   . Cancer Sister        sarcoma on arm; chemo  . Diabetes Sister   .   Stroke Sister   . Diabetes Brother   . Breast cancer Neg Hx   . Ovarian cancer Neg Hx   . Colon cancer Neg Hx   . Heart disease Neg Hx      Current Outpatient Medications:  .  allopurinol (ZYLOPRIM) 100 MG tablet, Take 100 mg by mouth at bedtime., Disp: , Rfl:  .  amLODipine (NORVASC) 10 MG tablet, Take 10 mg by mouth at bedtime. , Disp: , Rfl: 0 .  Calcium Carb-Cholecalciferol (CALCIUM 600+D3 PO), Take 1 tablet by mouth at bedtime., Disp: , Rfl:  .  lidocaine-prilocaine (EMLA) cream, Apply to affected area once, Disp: 30 g, Rfl: 3 .  losartan (COZAAR) 100 MG tablet, Take 100 mg by mouth at bedtime. , Disp: , Rfl: 0 .  metoprolol succinate (TOPROL-XL) 25 MG 24 hr tablet, Take 50 mg by mouth at bedtime., Disp: , Rfl: 0 .  potassium chloride SA (KLOR-CON) 20 MEQ tablet, TAKE 1 TABLET(20 MEQ) BY MOUTH  DAILY, Disp: 30 tablet, Rfl: 0 .  dexamethasone (DECADRON) 4 MG tablet, Take 2 tablets (8 mg total) by mouth daily. Take daily for 3 days after chemo. Take with food. (Patient not taking: No sig reported), Disp: 30 tablet, Rfl: 1 .  HYDROcodone-acetaminophen (NORCO/VICODIN) 5-325 MG tablet, Take 1 tablet by mouth every 6 (six) hours as needed for moderate pain. (Patient not taking: No sig reported), Disp: 15 tablet, Rfl: 0 .  ibuprofen (ADVIL) 800 MG tablet, Take 1 tablet (800 mg total) by mouth every 8 (eight) hours as needed. (Patient not taking: No sig reported), Disp: 30 tablet, Rfl: 0 .  LORazepam (ATIVAN) 0.5 MG tablet, Take 1 tablet (0.5 mg total) by mouth every 6 (six) hours as needed (Nausea or vomiting). (Patient not taking: No sig reported), Disp: 30 tablet, Rfl: 0 .  ondansetron (ZOFRAN) 8 MG tablet, Take 1 tablet (8 mg total) by mouth 2 (two) times daily as needed. Start on the third day after chemotherapy. (Patient not taking: No sig reported), Disp: 30 tablet, Rfl: 1 .  prochlorperazine (COMPAZINE) 10 MG tablet, Take 1 tablet (10 mg total) by mouth every 6 (six) hours as needed (Nausea or vomiting). (Patient not taking: No sig reported), Disp: 30 tablet, Rfl: 1 .  PROVENTIL HFA 108 (90 Base) MCG/ACT inhaler, Inhale 2 puffs into the lungs every 6 (six) hours as needed (wheezing/shortness of breath.). (Patient not taking: Reported on 03/12/2021), Disp: , Rfl:   Physical exam:  Vitals:   03/12/21 0846  BP: (!) 151/101  Pulse: 91  Resp: 18  Temp: (!) 97.4 F (36.3 C)  TempSrc: Tympanic  SpO2: 100%  Weight: 194 lb (88 kg)   Physical Exam Cardiovascular:     Rate and Rhythm: Regular rhythm. Tachycardia present.     Heart sounds: Normal heart sounds.  Pulmonary:     Effort: Pulmonary effort is normal.     Breath sounds: Normal breath sounds.  Abdominal:     General: Bowel sounds are normal.     Palpations: Abdomen is soft.  Skin:    General: Skin is warm and dry.     Comments:  Dark discoloration of bilateral palms as well as nails noted  Neurological:     Mental Status: She is alert and oriented to person, place, and time.      CMP Latest Ref Rng & Units 03/12/2021  Glucose 70 - 99 mg/dL 97  BUN 6 - 20 mg/dL 10  Creatinine 0.44 - 1.00 mg/dL 0.72    Sodium 135 - 145 mmol/L 138  Potassium 3.5 - 5.1 mmol/L 3.5  Chloride 98 - 111 mmol/L 105  CO2 22 - 32 mmol/L 23  Calcium 8.9 - 10.3 mg/dL 8.9  Total Protein 6.5 - 8.1 g/dL 6.4(L)  Total Bilirubin 0.3 - 1.2 mg/dL 0.5  Alkaline Phos 38 - 126 U/L 70  AST 15 - 41 U/L 20  ALT 0 - 44 U/L 14   CBC Latest Ref Rng & Units 03/12/2021  WBC 4.0 - 10.5 K/uL 4.3  Hemoglobin 12.0 - 15.0 g/dL 11.2(L)  Hematocrit 36.0 - 46.0 % 34.9(L)  Platelets 150 - 400 K/uL 223      Assessment and plan- Patient is a 59 y.o. female prior history of right breast cancer s/p lumpectomy now with anatomical stage IIAleft breast invasive mammary carcinoma [id number] ER/PR positive and HER-2 negative.she is s/p 4 cycles of dose dense AC chemotherapy.She is here for on treatment assessment prior to cycle 7 of weekly Taxol chemotherapy  Counts okay to proceed with cycle 7 of weekly Taxol chemotherapy today.  She will proceed with cycle 8 next week and I will see her back in 2 weeks for cycle 9.  She is receiving Taxol at a reduced dose of 65 mg per metered squared due to ongoing mild peripheral neuropathy which is essentially stable at this time  Normocytic anemia: Likely secondary to chemotherapy.  Continue to monitor  Chemo induced skin changes and hyperpigmentation in bilateral palms and feet.  Gradually improving.  Continue to monitor   Visit Diagnosis 1. Encounter for antineoplastic chemotherapy   2. Malignant neoplasm of upper-outer quadrant of right breast in female, estrogen receptor positive (HCC)   3. Chemotherapy-induced peripheral neuropathy (HCC)      Dr. Archana Rao, MD, MPH [facility] [phone] 03/12/2021 1:33 PM                

## 2021-03-12 NOTE — Patient Instructions (Signed)
[facility] [facility]    Discharge Instructions: Thank you for choosing [facility] to provide your oncology and hematology care.  If you have a lab appointment with the Cancer Center, please go directly to the Cancer Center and check in at the registration area.  Wear comfortable clothing and clothing appropriate for easy access to any Portacath or PICC line.   We strive to give you quality time with your provider. You may need to reschedule your appointment if you arrive late (15 or more minutes).  Arriving late affects you and other patients whose appointments are after yours.  Also, if you miss three or more appointments without notifying the office, you may be dismissed from the clinic at the provider's discretion.      For prescription refill requests, have your pharmacy contact our office and allow 72 hours for refills to be completed.    Today you received the following chemotherapy and/or immunotherapy agents - Taxol   To help prevent nausea and vomiting after your treatment, we encourage you to take your nausea medication as directed.  BELOW ARE SYMPTOMS THAT SHOULD BE REPORTED IMMEDIATELY: . *FEVER GREATER THAN 100.4 F (38 C) OR HIGHER . *CHILLS OR SWEATING . *NAUSEA AND VOMITING THAT IS NOT CONTROLLED WITH YOUR NAUSEA MEDICATION . *UNUSUAL SHORTNESS OF BREATH . *UNUSUAL BRUISING OR BLEEDING . *URINARY PROBLEMS (pain or burning when urinating, or frequent urination) . *BOWEL PROBLEMS (unusual diarrhea, constipation, pain near the anus) . TENDERNESS IN MOUTH AND THROAT WITH OR WITHOUT PRESENCE OF ULCERS (sore throat, sores in mouth, or a toothache) . UNUSUAL RASH, SWELLING OR PAIN  . UNUSUAL VAGINAL DISCHARGE OR ITCHING   Items with * indicate a potential emergency and should be followed up as soon as possible or go to the Emergency Department if any problems should occur.  Please show the CHEMOTHERAPY ALERT CARD or IMMUNOTHERAPY ALERT  CARD at check-in to the Emergency Department and triage nurse.  Should you have questions after your visit or need to cancel or reschedule your appointment, please contact [facility]  [phone] and follow the prompts.  Office hours are 8:00 a.m. to 4:30 p.m. Monday - Friday. Please note that voicemails left after 4:00 p.m. may not be returned until the following business day.  We are closed weekends and major holidays. You have access to a nurse at all times for urgent questions. Please call the main number to the clinic [phone] and follow the prompts.  For any non-urgent questions, you may also contact your provider using MyChart. We now offer e-Visits for anyone 18 and older to request care online for non-urgent symptoms. For details visit [email or web address].   Also download the MyChart app! Go to the app store, search "MyChart", open the app, select [facility], and log in with your MyChart username and password.  Due to Covid, a mask is required upon entering the hospital/clinic. If you do not have a mask, one will be given to you upon arrival. For doctor visits, patients may have 1 support person aged 18 or older with them. For treatment visits, patients cannot have anyone with them due to current Covid guidelines and our immunocompromised population.   Paclitaxel injection What is this medicine? PACLITAXEL (PAK li TAX el) is a chemotherapy drug. It targets fast dividing cells, like cancer cells, and causes these cells to die. This medicine is used to treat ovarian cancer, breast cancer, lung cancer, Kaposi's sarcoma, and other   Kaposi's sarcoma, and other cancers. This medicine may be used for other purposes; ask your health care provider or pharmacist if you have questions. COMMON BRAND NAME(S): Onxol, Taxol What should I tell my health care provider before I take this medicine? They need to know if you have any of these conditions:  history of irregular  heartbeat  liver disease  low blood counts, like low white cell, platelet, or red cell counts  lung or breathing disease, like asthma  tingling of the fingers or toes, or other nerve disorder  an unusual or allergic reaction to paclitaxel, alcohol, polyoxyethylated castor oil, other chemotherapy, other medicines, foods, dyes, or preservatives  pregnant or trying to get pregnant  breast-feeding How should I use this medicine? This drug is given as an infusion into a vein. It is administered in a hospital or clinic by a specially trained health care professional. Talk to your pediatrician regarding the use of this medicine in children. Special care may be needed. Overdosage: If you think you have taken too much of this medicine contact a poison control center or emergency room at once. NOTE: This medicine is only for you. Do not share this medicine with others. What if I miss a dose? It is important not to miss your dose. Call your doctor or health care professional if you are unable to keep an appointment. What may interact with this medicine? Do not take this medicine with any of the following medications:  live virus vaccines This medicine may also interact with the following medications:  antiviral medicines for hepatitis, HIV or AIDS  certain antibiotics like erythromycin and clarithromycin  certain medicines for fungal infections like ketoconazole and itraconazole  certain medicines for seizures like carbamazepine, phenobarbital, phenytoin  gemfibrozil  nefazodone  rifampin  St. John's wort This list may not describe all possible interactions. Give your health care provider a list of all the medicines, herbs, non-prescription drugs, or dietary supplements you use. Also tell them if you smoke, drink alcohol, or use illegal drugs. Some items may interact with your medicine. What should I watch for while using this medicine? Your condition will be monitored carefully  while you are receiving this medicine. You will need important blood work done while you are taking this medicine. This medicine can cause serious allergic reactions. To reduce your risk you will need to take other medicine(s) before treatment with this medicine. If you experience allergic reactions like skin rash, itching or hives, swelling of the face, lips, or tongue, tell your doctor or health care professional right away. In some cases, you may be given additional medicines to help with side effects. Follow all directions for their use. This drug may make you feel generally unwell. This is not uncommon, as chemotherapy can affect healthy cells as well as cancer cells. Report any side effects. Continue your course of treatment even though you feel ill unless your doctor tells you to stop. Call your doctor or health care professional for advice if you get a fever, chills or sore throat, or other symptoms of a cold or flu. Do not treat yourself. This drug decreases your body's ability to fight infections. Try to avoid being around people who are sick. This medicine may increase your risk to bruise or bleed. Call your doctor or health care professional if you notice any unusual bleeding. Be careful brushing and flossing your teeth or using a toothpick because you may get an infection or bleed more easily. If you have any  your dentist you are receiving this medicine. Avoid taking products that contain aspirin, acetaminophen, ibuprofen, naproxen, or ketoprofen unless instructed by your doctor. These medicines may hide a fever. Do not become pregnant while taking this medicine. Women should inform their doctor if they wish to become pregnant or think they might be pregnant. There is a potential for serious side effects to an unborn child. Talk to your health care professional or pharmacist for more information. Do not breast-feed an infant while taking this medicine. Men are advised not  to father a child while receiving this medicine. This product may contain alcohol. Ask your pharmacist or healthcare provider if this medicine contains alcohol. Be sure to tell all healthcare providers you are taking this medicine. Certain medicines, like metronidazole and disulfiram, can cause an unpleasant reaction when taken with alcohol. The reaction includes flushing, headache, nausea, vomiting, sweating, and increased thirst. The reaction can last from 30 minutes to several hours. What side effects may I notice from receiving this medicine? Side effects that you should report to your doctor or health care professional as soon as possible:  allergic reactions like skin rash, itching or hives, swelling of the face, lips, or tongue  breathing problems  changes in vision  fast, irregular heartbeat  high or low blood pressure  mouth sores  pain, tingling, numbness in the hands or feet  signs of decreased platelets or bleeding - bruising, pinpoint red spots on the skin, black, tarry stools, blood in the urine  signs of decreased red blood cells - unusually weak or tired, feeling faint or lightheaded, falls  signs of infection - fever or chills, cough, sore throat, pain or difficulty passing urine  signs and symptoms of liver injury like dark yellow or brown urine; general ill feeling or flu-like symptoms; light-colored stools; loss of appetite; nausea; right upper belly pain; unusually weak or tired; yellowing of the eyes or skin  swelling of the ankles, feet, hands  unusually slow heartbeat Side effects that usually do not require medical attention (report to your doctor or health care professional if they continue or are bothersome):  diarrhea  hair loss  loss of appetite  muscle or joint pain  nausea, vomiting  pain, redness, or irritation at site where injected  tiredness This list may not describe all possible side effects. Call your doctor for medical advice about  side effects. You may report side effects to FDA at 1-800-FDA-1088. Where should I keep my medicine? This drug is given in a hospital or clinic and will not be stored at home. NOTE: This sheet is a summary. It may not cover all possible information. If you have questions about this medicine, talk to your doctor, pharmacist, or health care provider.  2021 [employer] (2019-09-19 13:37:23)  

## 2021-03-12 NOTE — Progress Notes (Signed)
Pt in for follow up and treatment today.  Reports not having much of an appetite and feeling full.  Pt also reports having a migraine headache this morning.  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

## 2021-03-19 ENCOUNTER — Other Ambulatory Visit: Payer: 59

## 2021-03-19 ENCOUNTER — Inpatient Hospital Stay: Payer: 59

## 2021-03-19 ENCOUNTER — Ambulatory Visit: Payer: 59

## 2021-03-19 ENCOUNTER — Other Ambulatory Visit: Payer: Self-pay

## 2021-03-19 VITALS — BP 142/82 | HR 96 | Temp 98.7°F | Resp 18 | Wt 192.0 lb

## 2021-03-19 DIAGNOSIS — C50812 Malignant neoplasm of overlapping sites of left female breast: Secondary | ICD-10-CM

## 2021-03-19 DIAGNOSIS — Z5111 Encounter for antineoplastic chemotherapy: Secondary | ICD-10-CM | POA: Diagnosis not present

## 2021-03-19 DIAGNOSIS — Z170 Estrogen receptor positive status [ER+]: Secondary | ICD-10-CM

## 2021-03-19 LAB — CBC WITH DIFFERENTIAL/PLATELET
Abs Immature Granulocytes: 0.08 10*3/uL — ABNORMAL HIGH (ref 0.00–0.07)
Basophils Absolute: 0 10*3/uL (ref 0.0–0.1)
Basophils Relative: 1 %
Eosinophils Absolute: 0.1 10*3/uL (ref 0.0–0.5)
Eosinophils Relative: 2 %
HCT: 36 % (ref 36.0–46.0)
Hemoglobin: 11.7 g/dL — ABNORMAL LOW (ref 12.0–15.0)
Immature Granulocytes: 2 %
Lymphocytes Relative: 17 %
Lymphs Abs: 0.8 10*3/uL (ref 0.7–4.0)
MCH: 28.7 pg (ref 26.0–34.0)
MCHC: 32.5 g/dL (ref 30.0–36.0)
MCV: 88.2 fL (ref 80.0–100.0)
Monocytes Absolute: 0.5 10*3/uL (ref 0.1–1.0)
Monocytes Relative: 11 %
Neutro Abs: 3.2 10*3/uL (ref 1.7–7.7)
Neutrophils Relative %: 67 %
Platelets: 233 10*3/uL (ref 150–400)
RBC: 4.08 MIL/uL (ref 3.87–5.11)
RDW: 16.2 % — ABNORMAL HIGH (ref 11.5–15.5)
WBC: 4.9 10*3/uL (ref 4.0–10.5)
nRBC: 0 % (ref 0.0–0.2)

## 2021-03-19 LAB — COMPREHENSIVE METABOLIC PANEL
ALT: 11 U/L (ref 0–44)
AST: 19 U/L (ref 15–41)
Albumin: 3.6 g/dL (ref 3.5–5.0)
Alkaline Phosphatase: 62 U/L (ref 38–126)
Anion gap: 10 (ref 5–15)
BUN: 8 mg/dL (ref 6–20)
CO2: 23 mmol/L (ref 22–32)
Calcium: 9.4 mg/dL (ref 8.9–10.3)
Chloride: 105 mmol/L (ref 98–111)
Creatinine, Ser: 0.56 mg/dL (ref 0.44–1.00)
GFR, Estimated: >60 mL/min (ref >60)
Glucose, Bld: 98 mg/dL (ref 70–99)
Potassium: 3.3 mmol/L — ABNORMAL LOW (ref 3.5–5.1)
Sodium: 138 mmol/L (ref 135–145)
Total Bilirubin: 0.6 mg/dL (ref 0.3–1.2)
Total Protein: 6.7 g/dL (ref 6.5–8.1)

## 2021-03-19 MED ORDER — HEPARIN SOD (PORK) LOCK FLUSH 100 UNIT/ML IV SOLN
500.0000 [IU] | Freq: Once | INTRAVENOUS | Status: AC | PRN
  Administered 2021-03-19: 500 [IU]
  Filled 2021-03-19: qty 5

## 2021-03-19 MED ORDER — SODIUM CHLORIDE 0.9 % IV SOLN
65.0000 mg/m2 | Freq: Once | INTRAVENOUS | Status: AC
  Administered 2021-03-19: 132 mg via INTRAVENOUS
  Filled 2021-03-19: qty 22

## 2021-03-19 MED ORDER — FAMOTIDINE 20 MG IN NS 100 ML IVPB
20.0000 mg | Freq: Once | INTRAVENOUS | Status: AC
  Administered 2021-03-19: 20 mg via INTRAVENOUS
  Filled 2021-03-19: qty 20

## 2021-03-19 MED ORDER — SODIUM CHLORIDE 0.9% FLUSH
10.0000 mL | Freq: Once | INTRAVENOUS | Status: AC
  Administered 2021-03-19: 10 mL via INTRAVENOUS
  Filled 2021-03-19: qty 10

## 2021-03-19 MED ORDER — HEPARIN SOD (PORK) LOCK FLUSH 100 UNIT/ML IV SOLN
INTRAVENOUS | Status: AC
  Filled 2021-03-19: qty 5

## 2021-03-19 MED ORDER — SODIUM CHLORIDE 0.9 % IV SOLN
20.0000 mg | Freq: Once | INTRAVENOUS | Status: AC
Start: 2021-03-19 — End: 2021-03-19
  Administered 2021-03-19: 20 mg via INTRAVENOUS
  Filled 2021-03-19: qty 20

## 2021-03-19 MED ORDER — SODIUM CHLORIDE 0.9 % IV SOLN
Freq: Once | INTRAVENOUS | Status: AC
  Filled 2021-03-19: qty 250

## 2021-03-19 MED ORDER — DIPHENHYDRAMINE HCL 50 MG/ML IJ SOLN
50.0000 mg | Freq: Once | INTRAMUSCULAR | Status: AC
  Administered 2021-03-19: 50 mg via INTRAVENOUS
  Filled 2021-03-19: qty 1

## 2021-03-19 NOTE — Patient Instructions (Signed)
[facility]    Discharge Instructions: Thank you for choosing [facility] to provide your oncology and hematology care.  If you have a lab appointment with the [facility], please go directly to the [facility] and check in at the registration area.  Wear comfortable clothing and clothing appropriate for easy access to any Portacath or PICC line.   We strive to give you quality time with your provider. You may need to reschedule your appointment if you arrive late (15 or more minutes).  Arriving late affects you and other patients whose appointments are after yours.  Also, if you miss three or more appointments without notifying the office, you may be dismissed from the clinic at the provider's discretion.      For prescription refill requests, have your pharmacy contact our office and allow 72 hours for refills to be completed.    Today you received the following chemotherapy and/or immunotherapy agents - taxol   To help prevent nausea and vomiting after your treatment, we encourage you to take your nausea medication as directed.  BELOW ARE SYMPTOMS THAT SHOULD BE REPORTED IMMEDIATELY: . *FEVER GREATER THAN 100.4 F (38 C) OR HIGHER . *CHILLS OR SWEATING . *NAUSEA AND VOMITING THAT IS NOT CONTROLLED WITH YOUR NAUSEA MEDICATION . *UNUSUAL SHORTNESS OF BREATH . *UNUSUAL BRUISING OR BLEEDING . *URINARY PROBLEMS (pain or burning when urinating, or frequent urination) . *BOWEL PROBLEMS (unusual diarrhea, constipation, pain near the anus) . TENDERNESS IN MOUTH AND THROAT WITH OR WITHOUT PRESENCE OF ULCERS (sore throat, sores in mouth, or a toothache) . UNUSUAL RASH, SWELLING OR PAIN  . UNUSUAL VAGINAL DISCHARGE OR ITCHING   Items with * indicate a potential emergency and should be followed up as soon as possible or go to the Emergency Department if any problems should occur.  Please show the CHEMOTHERAPY ALERT CARD or IMMUNOTHERAPY ALERT  CARD at check-in to the Emergency Department and triage nurse.  Should you have questions after your visit or need to cancel or reschedule your appointment, please contact [facility]  [phone] and follow the prompts.  Office hours are 8:00 a.m. to 4:30 p.m. Monday - Friday. Please note that voicemails left after 4:00 p.m. may not be returned until the following business day.  We are closed weekends and major holidays. You have access to a nurse at all times for urgent questions. Please call the main number to the clinic [phone] and follow the prompts.  For any non-urgent questions, you may also contact your provider using MyChart. We now offer e-Visits for anyone 47 and older to request care online for non-urgent symptoms. For details visit [email or web address].   Also download the MyChart app! Go to the app store, search "MyChart", open the app, select [facility], and log in with your MyChart username and password.  Due to Covid, a mask is required upon entering the hospital/clinic. If you do not have a mask, one will be given to you upon arrival. For doctor visits, patients may have 1 support person aged 61 or older with them. For treatment visits, patients cannot have anyone with them due to current Covid guidelines and our immunocompromised population.   Paclitaxel injection What is this medicine? PACLITAXEL (PAK li TAX el) is a chemotherapy drug. It targets fast dividing cells, like cancer cells, and causes these cells to die. This medicine is used to treat ovarian cancer, breast cancer, lung cancer, Kaposi's sarcoma, and other  cancers. This medicine may be used for other purposes; ask your health care provider or pharmacist if you have questions. COMMON BRAND NAME(S): Onxol, Taxol What should I tell my health care provider before I take this medicine? They need to know if you have any of these conditions:  history of irregular  heartbeat  liver disease  low blood counts, like low Jaquis Picklesimer cell, platelet, or red cell counts  lung or breathing disease, like asthma  tingling of the fingers or toes, or other nerve disorder  an unusual or allergic reaction to paclitaxel, alcohol, polyoxyethylated castor oil, other chemotherapy, other medicines, foods, dyes, or preservatives  pregnant or trying to get pregnant  breast-feeding How should I use this medicine? This drug is given as an infusion into a vein. It is administered in a hospital or clinic by a specially trained health care professional. Talk to your pediatrician regarding the use of this medicine in children. Special care may be needed. Overdosage: If you think you have taken too much of this medicine contact a poison control center or emergency room at once. NOTE: This medicine is only for you. Do not share this medicine with others. What if I miss a dose? It is important not to miss your dose. Call your doctor or health care professional if you are unable to keep an appointment. What may interact with this medicine? Do not take this medicine with any of the following medications:  live virus vaccines This medicine may also interact with the following medications:  antiviral medicines for hepatitis, HIV or AIDS  certain antibiotics like erythromycin and clarithromycin  certain medicines for fungal infections like ketoconazole and itraconazole  certain medicines for seizures like carbamazepine, phenobarbital, phenytoin  gemfibrozil  nefazodone  rifampin  St. John's wort This list may not describe all possible interactions. Give your health care provider a list of all the medicines, herbs, non-prescription drugs, or dietary supplements you use. Also tell them if you smoke, drink alcohol, or use illegal drugs. Some items may interact with your medicine. What should I watch for while using this medicine? Your condition will be monitored carefully  while you are receiving this medicine. You will need important blood work done while you are taking this medicine. This medicine can cause serious allergic reactions. To reduce your risk you will need to take other medicine(s) before treatment with this medicine. If you experience allergic reactions like skin rash, itching or hives, swelling of the face, lips, or tongue, tell your doctor or health care professional right away. In some cases, you may be given additional medicines to help with side effects. Follow all directions for their use. This drug may make you feel generally unwell. This is not uncommon, as chemotherapy can affect healthy cells as well as cancer cells. Report any side effects. Continue your course of treatment even though you feel ill unless your doctor tells you to stop. Call your doctor or health care professional for advice if you get a fever, chills or sore throat, or other symptoms of a cold or flu. Do not treat yourself. This drug decreases your body's ability to fight infections. Try to avoid being around people who are sick. This medicine may increase your risk to bruise or bleed. Call your doctor or health care professional if you notice any unusual bleeding. Be careful brushing and flossing your teeth or using a toothpick because you may get an infection or bleed more easily. If you have any dental work done, tell  your dentist you are receiving this medicine. Avoid taking products that contain aspirin, acetaminophen, ibuprofen, naproxen, or ketoprofen unless instructed by your doctor. These medicines may hide a fever. Do not become pregnant while taking this medicine. Women should inform their doctor if they wish to become pregnant or think they might be pregnant. There is a potential for serious side effects to an unborn child. Talk to your health care professional or pharmacist for more information. Do not breast-feed an infant while taking this medicine. Men are advised not  to father a child while receiving this medicine. This product may contain alcohol. Ask your pharmacist or healthcare provider if this medicine contains alcohol. Be sure to tell all healthcare providers you are taking this medicine. Certain medicines, like metronidazole and disulfiram, can cause an unpleasant reaction when taken with alcohol. The reaction includes flushing, headache, nausea, vomiting, sweating, and increased thirst. The reaction can last from 30 minutes to several hours. What side effects may I notice from receiving this medicine? Side effects that you should report to your doctor or health care professional as soon as possible:  allergic reactions like skin rash, itching or hives, swelling of the face, lips, or tongue  breathing problems  changes in vision  fast, irregular heartbeat  high or low blood pressure  mouth sores  pain, tingling, numbness in the hands or feet  signs of decreased platelets or bleeding - bruising, pinpoint red spots on the skin, black, tarry stools, blood in the urine  signs of decreased red blood cells - unusually weak or tired, feeling faint or lightheaded, falls  signs of infection - fever or chills, cough, sore throat, pain or difficulty passing urine  signs and symptoms of liver injury like dark yellow or brown urine; general ill feeling or flu-like symptoms; light-colored stools; loss of appetite; nausea; right upper belly pain; unusually weak or tired; yellowing of the eyes or skin  swelling of the ankles, feet, hands  unusually slow heartbeat Side effects that usually do not require medical attention (report to your doctor or health care professional if they continue or are bothersome):  diarrhea  hair loss  loss of appetite  muscle or joint pain  nausea, vomiting  pain, redness, or irritation at site where injected  tiredness This list may not describe all possible side effects. Call your doctor for medical advice about  side effects. You may report side effects to FDA at 1-800-FDA-1088. Where should I keep my medicine? This drug is given in a hospital or clinic and will not be stored at home. NOTE: This sheet is a summary. It may not cover all possible information. If you have questions about this medicine, talk to your doctor, pharmacist, or health care provider.  2021 Elsevier/Gold Standard (2019-09-19 13:37:23)  Diphenhydramine injection What is this medicine? DIPHENHYDRAMINE (dye fen HYE dra meen) is an antihistamine. It is used to treat the symptoms of an allergic reaction and motion sickness. It is also used to treat Parkinson's disease. This medicine may be used for other purposes; ask your health care provider or pharmacist if you have questions. COMMON BRAND NAME(S): Benadryl What should I tell my health care provider before I take this medicine? They need to know if you have any of these conditions:  asthma or lung disease  glaucoma  high blood pressure or heart disease  liver disease  pain or difficulty passing urine  prostate trouble  ulcers or other stomach problems  an unusual or allergic reaction to  diphenhydramine, antihistamines, other medicines foods, dyes, or preservatives  pregnant or trying to get pregnant  breast-feeding How should I use this medicine? This medicine is for injection into a vein or a muscle. It is usually given by a health care professional in a hospital or clinic setting. If you get this medicine at home, you will be taught how to prepare and give this medicine. Use exactly as directed. Take your medicine at regular intervals. Do not take your medicine more often than directed. It is important that you put your used needles and syringes in a special sharps container. Do not put them in a trash can. If you do not have a sharps container, call your pharmacist or healthcare provider to get one. Talk to your pediatrician regarding the use of this medicine in  children. While this drug may be prescribed for selected conditions, precautions do apply. This medicine is not approved for use in newborns and premature babies. Patients over 12 years old may have a stronger reaction and need a smaller dose. Overdosage: If you think you have taken too much of this medicine contact a poison control center or emergency room at once. NOTE: This medicine is only for you. Do not share this medicine with others. What if I miss a dose? If you miss a dose, take it as soon as you can. If it is almost time for your next dose, take only that dose. Do not take double or extra doses. What may interact with this medicine? Do not take this medicine with any of the following medications:  MAOIs like Carbex, Eldepryl, Marplan, Nardil, and Parnate This medicine may also interact with the following medications:  alcohol  barbiturates, like phenobarbital  medicines for bladder spasm like oxybutynin, tolterodine  medicines for blood pressure  medicines for depression, anxiety, or psychotic disturbances  medicines for movement abnormalities or Parkinson's disease  medicines for sleep  other medicines for cold, cough or allergy  some medicines for the stomach like chlordiazepoxide, dicyclomine This list may not describe all possible interactions. Give your health care provider a list of all the medicines, herbs, non-prescription drugs, or dietary supplements you use. Also tell them if you smoke, drink alcohol, or use illegal drugs. Some items may interact with your medicine. What should I watch for while using this medicine? Your condition will be monitored carefully while you are receiving this medicine. Tell your doctor or healthcare professional if your symptoms do not start to get better or if they get worse. You may get drowsy or dizzy. Do not drive, use machinery, or do anything that needs mental alertness until you know how this medicine affects you. Do not stand  or sit up quickly, especially if you are an older patient. This reduces the risk of dizzy or fainting spells. Alcohol may interfere with the effect of this medicine. Avoid alcoholic drinks. Your mouth may get dry. Chewing sugarless gum or sucking hard candy, and drinking plenty of water may help. Contact your doctor if the problem does not go away or is severe. What side effects may I notice from receiving this medicine? Side effects that you should report to your doctor or health care professional as soon as possible:  allergic reactions like skin rash, itching or hives, swelling of the face, lips, or tongue  breathing problems  changes in vision  chills  confused, agitated, nervous  irregular or fast heartbeat  low blood pressure  seizures  tremor  trouble passing urine  unusual  bleeding or bruising  unusually weak or tired Side effects that usually do not require medical attention (report to your doctor or health care professional if they continue or are bothersome):  constipation, diarrhea  drowsy  headache  loss of appetite  stomach upset, vomiting  sweating  thick mucous This list may not describe all possible side effects. Call your doctor for medical advice about side effects. You may report side effects to FDA at 1-800-FDA-1088. Where should I keep my medicine? Keep out of the reach of children. If you are using this medicine at home, you will be instructed on how to store this medicine. Throw away any unused medicine after the expiration date on the label. NOTE: This sheet is a summary. It may not cover all possible information. If you have questions about this medicine, talk to your doctor, pharmacist, or health care provider.  2021 Elsevier/Gold Standard (2008-02-06 14:28:35)  Famotidine injection What is this medicine? FAMOTIDINE (fa MOE ti deen) is a type of antihistamine that blocks the release of stomach acid. It is used to treat stomach or  intestinal ulcers. It can relieve ulcer pain and discomfort, and the heartburn from acid reflux. This medicine may be used for other purposes; ask your health care provider or pharmacist if you have questions. COMMON BRAND NAME(S): Pepcid What should I tell my health care provider before I take this medicine? They need to know if you have any of these conditions:  kidney or liver disease  an unusual or allergic reaction to famotidine, other medicines, foods, dyes, or preservatives  pregnant or trying to get pregnant  breast-feeding How should I use this medicine? This medicine is for infusion into a vein. It is given by a health care professional in a hospital or clinic setting. Talk to your pediatrician regarding the use of this medicine in children. Special care may be needed. Overdosage: If you think you have taken too much of this medicine contact a poison control center or emergency room at once. NOTE: This medicine is only for you. Do not share this medicine with others. What if I miss a dose? This does not apply. What may interact with this medicine?  delavirdine  itraconazole  ketoconazole This list may not describe all possible interactions. Give your health care provider a list of all the medicines, herbs, non-prescription drugs, or dietary supplements you use. Also tell them if you smoke, drink alcohol, or use illegal drugs. Some items may interact with your medicine. What should I watch for while using this medicine? Tell your doctor or health care professional if your condition does not start to get better or gets worse. Do not take with aspirin, ibuprofen, or other antiinflammatory medicines. These can aggravate your condition. Do not smoke cigarettes or drink alcohol. These increase irritation in your stomach and can increase the time it will take for ulcers to heal. Cigarettes and alcohol can also worsen acid reflux or heartburn. If you get black, tarry stools or vomit  up what looks like coffee grounds, call your doctor or health care professional at once. You may have a bleeding ulcer. This medicine may cause a decrease in vitamin B12. You should make sure that you get enough vitamin B12 while you are taking this medicine. Discuss the foods you eat and the vitamins you take with your health care professional. What side effects may I notice from receiving this medicine? Side effects that you should report to your doctor or health care professional as soon  as possible:  allergic reactions like skin rash, itching or hives, swelling of the face, lips, or tongue  agitation, nervousness  confusion  hallucinations Side effects that usually do not require medical attention (report to your doctor or health care professional if they continue or are bothersome):  constipation  diarrhea  dizziness  headache This list may not describe all possible side effects. Call your doctor for medical advice about side effects. You may report side effects to FDA at 1-800-FDA-1088. Where should I keep my medicine? This medicine is given in a hospital or clinic. You will not be given this medicine to store at home. NOTE: This sheet is a summary. It may not cover all possible information. If you have questions about this medicine, talk to your doctor, pharmacist, or health care provider.  2021 Elsevier/Gold Standard (2017-06-03 13:16:46)  Dexamethasone injection What is this medicine? DEXAMETHASONE (dex a METH a sone) is a corticosteroid. It is used to treat inflammation of the skin, joints, lungs, and other organs. Common conditions treated include asthma, allergies, and arthritis. It is also used for other conditions, like blood disorders and diseases of the adrenal glands. This medicine may be used for other purposes; ask your health care provider or pharmacist if you have questions. COMMON BRAND NAME(S): Decadron, DoubleDex, ReadySharp Dexamethasone, Simplist  Dexamethasone, Solurex What should I tell my health care provider before I take this medicine? They need to know if you have any of these conditions:  Cushing's syndrome  diabetes  glaucoma  heart disease  high blood pressure  infection like herpes, measles, tuberculosis, or chickenpox  kidney disease  liver disease  mental illness  myasthenia gravis  osteoporosis  previous heart attack  seizures  stomach or intestine problems  thyroid disease  an unusual or allergic reaction to dexamethasone, corticosteroids, other medicines, lactose, foods, dyes, or preservatives  pregnant or trying to get pregnant  breast-feeding How should I use this medicine? This medicine is for injection into a muscle, joint, lesion, soft tissue, or vein. It is given by a health care professional in a hospital or clinic setting. Talk to your pediatrician regarding the use of this medicine in children. Special care may be needed. Overdosage: If you think you have taken too much of this medicine contact a poison control center or emergency room at once. NOTE: This medicine is only for you. Do not share this medicine with others. What if I miss a dose? This may not apply. If you are having a series of injections over a prolonged period, try not to miss an appointment. Call your doctor or health care professional to reschedule if you are unable to keep an appointment. What may interact with this medicine? Do not take this medicine with any of the following medications:  live virus vaccines This medicine may also interact with the following medications:  aminoglutethimide  amphotericin B  aspirin and aspirin-like medicines  certain antibiotics like erythromycin, clarithromycin, and troleandomycin  certain antivirals for HIV or hepatitis  certain medicines for seizures like carbamazepine, phenobarbital, phenytoin  certain medicines to treat myasthenia  gravis  cholestyramine  cyclosporine  digoxin  diuretics  ephedrine  female hormones, like estrogen or progestins and birth control pills  insulin or other medicines for diabetes  isoniazid  ketoconazole  medicines that relax muscles for surgery  mifepristone  NSAIDs, medicines for pain and inflammation, like ibuprofen or naproxen  rifampin  skin tests for allergies  thalidomide  vaccines  warfarin This list may not  describe all possible interactions. Give your health care provider a list of all the medicines, herbs, non-prescription drugs, or dietary supplements you use. Also tell them if you smoke, drink alcohol, or use illegal drugs. Some items may interact with your medicine. What should I watch for while using this medicine? Visit your health care professional for regular checks on your progress. Tell your health care professional if your symptoms do not start to get better or if they get worse. Your condition will be monitored carefully while you are receiving this medicine. Wear a medical ID bracelet or chain. Carry a card that describes your disease and details of your medicine and dosage times. This medicine may increase your risk of getting an infection. Call your health care professional for advice if you get a fever, chills, or sore throat, or other symptoms of a cold or flu. Do not treat yourself. Try to avoid being around people who are sick. Call your health care professional if you are around anyone with measles, chickenpox, or if you develop sores or blisters that do not heal properly. If you are going to need surgery or other procedures, tell your doctor or health care professional that you have taken this medicine within the last 12 months. Ask your doctor or health care professional about your diet. You may need to lower the amount of salt you eat. This medicine may increase blood sugar. Ask your healthcare provider if changes in diet or medicines are  needed if you have diabetes. What side effects may I notice from receiving this medicine? Side effects that you should report to your doctor or health care professional as soon as possible:  allergic reactions like skin rash, itching or hives, swelling of the face, lips, or tongue  bloody or black, tarry stools  changes in emotions or moods  changes in vision  confusion, excitement, restlessness  depressed mood  eye pain  hallucinations  muscle weakness  severe or sudden stomach or belly pain  signs and symptoms of high blood sugar such as being more thirsty or hungry or having to urinate more than normal. You may also feel very tired or have blurry vision.  signs and symptoms of infection like fever; chills; cough; sore throat; pain or trouble passing urine  swelling of ankles, feet  unusual bruising or bleeding  wounds that do not heal Side effects that usually do not require medical attention (report to your doctor or health care professional if they continue or are bothersome):  increased appetite  increased growth of face or body hair  headache  nausea, vomiting  pain, redness, or irritation at site where injected  skin problems, acne, thin and shiny skin  trouble sleeping  weight gain This list may not describe all possible side effects. Call your doctor for medical advice about side effects. You may report side effects to FDA at 1-800-FDA-1088. Where should I keep my medicine? This medicine is given in a hospital or clinic and will not be stored at home. NOTE: This sheet is a summary. It may not cover all possible information. If you have questions about this medicine, talk to your doctor, pharmacist, or health care provider.  2021 Elsevier/Gold Standard (2019-05-01 13:51:58)

## 2021-03-19 NOTE — Progress Notes (Signed)
Pt received taxol infusion in clinic today. Tolerated well. No complaints at d/c. °

## 2021-03-24 ENCOUNTER — Ambulatory Visit: Payer: 59

## 2021-03-24 ENCOUNTER — Other Ambulatory Visit: Payer: 59

## 2021-03-24 ENCOUNTER — Ambulatory Visit: Payer: 59 | Admitting: Oncology

## 2021-03-27 ENCOUNTER — Other Ambulatory Visit: Payer: 59

## 2021-03-27 ENCOUNTER — Inpatient Hospital Stay (HOSPITAL_BASED_OUTPATIENT_CLINIC_OR_DEPARTMENT_OTHER): Payer: 59 | Admitting: Oncology

## 2021-03-27 ENCOUNTER — Ambulatory Visit: Payer: 59 | Admitting: Oncology

## 2021-03-27 ENCOUNTER — Inpatient Hospital Stay: Payer: 59

## 2021-03-27 ENCOUNTER — Encounter: Payer: Self-pay | Admitting: Oncology

## 2021-03-27 ENCOUNTER — Ambulatory Visit: Payer: 59

## 2021-03-27 VITALS — BP 152/92 | HR 86 | Temp 97.2°F | Resp 17

## 2021-03-27 VITALS — BP 147/86 | HR 104 | Temp 98.0°F | Resp 16 | Ht 66.0 in | Wt 194.0 lb

## 2021-03-27 DIAGNOSIS — C50411 Malignant neoplasm of upper-outer quadrant of right female breast: Secondary | ICD-10-CM

## 2021-03-27 DIAGNOSIS — C50812 Malignant neoplasm of overlapping sites of left female breast: Secondary | ICD-10-CM

## 2021-03-27 DIAGNOSIS — Z170 Estrogen receptor positive status [ER+]: Secondary | ICD-10-CM

## 2021-03-27 DIAGNOSIS — Z5111 Encounter for antineoplastic chemotherapy: Secondary | ICD-10-CM

## 2021-03-27 DIAGNOSIS — T451X5A Adverse effect of antineoplastic and immunosuppressive drugs, initial encounter: Secondary | ICD-10-CM

## 2021-03-27 DIAGNOSIS — G620 Drug-induced polyneuropathy: Secondary | ICD-10-CM | POA: Diagnosis not present

## 2021-03-27 LAB — CBC WITH DIFFERENTIAL/PLATELET
Abs Immature Granulocytes: 0.08 10*3/uL — ABNORMAL HIGH (ref 0.00–0.07)
Basophils Absolute: 0 10*3/uL (ref 0.0–0.1)
Basophils Relative: 0 %
Eosinophils Absolute: 0.1 10*3/uL (ref 0.0–0.5)
Eosinophils Relative: 2 %
HCT: 35.2 % — ABNORMAL LOW (ref 36.0–46.0)
Hemoglobin: 11.5 g/dL — ABNORMAL LOW (ref 12.0–15.0)
Immature Granulocytes: 2 %
Lymphocytes Relative: 19 %
Lymphs Abs: 0.9 10*3/uL (ref 0.7–4.0)
MCH: 29.2 pg (ref 26.0–34.0)
MCHC: 32.7 g/dL (ref 30.0–36.0)
MCV: 89.3 fL (ref 80.0–100.0)
Monocytes Absolute: 0.5 10*3/uL (ref 0.1–1.0)
Monocytes Relative: 10 %
Neutro Abs: 3.1 10*3/uL (ref 1.7–7.7)
Neutrophils Relative %: 67 %
Platelets: 225 10*3/uL (ref 150–400)
RBC: 3.94 MIL/uL (ref 3.87–5.11)
RDW: 15.5 % (ref 11.5–15.5)
WBC: 4.7 10*3/uL (ref 4.0–10.5)
nRBC: 0 % (ref 0.0–0.2)

## 2021-03-27 LAB — COMPREHENSIVE METABOLIC PANEL
ALT: 10 U/L (ref 0–44)
AST: 18 U/L (ref 15–41)
Albumin: 3.8 g/dL (ref 3.5–5.0)
Alkaline Phosphatase: 60 U/L (ref 38–126)
Anion gap: 11 (ref 5–15)
BUN: 15 mg/dL (ref 6–20)
CO2: 23 mmol/L (ref 22–32)
Calcium: 9.5 mg/dL (ref 8.9–10.3)
Chloride: 104 mmol/L (ref 98–111)
Creatinine, Ser: 0.63 mg/dL (ref 0.44–1.00)
GFR, Estimated: >60 mL/min (ref >60)
Glucose, Bld: 91 mg/dL (ref 70–99)
Potassium: 3.5 mmol/L (ref 3.5–5.1)
Sodium: 138 mmol/L (ref 135–145)
Total Bilirubin: 0.4 mg/dL (ref 0.3–1.2)
Total Protein: 7 g/dL (ref 6.5–8.1)

## 2021-03-27 MED ORDER — DIPHENHYDRAMINE HCL 50 MG/ML IJ SOLN
50.0000 mg | Freq: Once | INTRAMUSCULAR | Status: AC
  Administered 2021-03-27: 50 mg via INTRAVENOUS
  Filled 2021-03-27: qty 1

## 2021-03-27 MED ORDER — SODIUM CHLORIDE 0.9 % IV SOLN
65.0000 mg/m2 | Freq: Once | INTRAVENOUS | Status: AC
  Administered 2021-03-27: 132 mg via INTRAVENOUS
  Filled 2021-03-27: qty 22

## 2021-03-27 MED ORDER — HEPARIN SOD (PORK) LOCK FLUSH 100 UNIT/ML IV SOLN
500.0000 [IU] | Freq: Once | INTRAVENOUS | Status: DC | PRN
  Filled 2021-03-27: qty 5

## 2021-03-27 MED ORDER — SODIUM CHLORIDE 0.9% FLUSH
10.0000 mL | INTRAVENOUS | Status: DC | PRN
  Administered 2021-03-27: 10 mL via INTRAVENOUS
  Filled 2021-03-27: qty 10

## 2021-03-27 MED ORDER — HEPARIN SOD (PORK) LOCK FLUSH 100 UNIT/ML IV SOLN
500.0000 [IU] | Freq: Once | INTRAVENOUS | Status: AC
  Administered 2021-03-27: 500 [IU] via INTRAVENOUS
  Filled 2021-03-27: qty 5

## 2021-03-27 MED ORDER — SODIUM CHLORIDE 0.9 % IV SOLN
20.0000 mg | Freq: Once | INTRAVENOUS | Status: AC
  Administered 2021-03-27: 20 mg via INTRAVENOUS
  Filled 2021-03-27: qty 20

## 2021-03-27 MED ORDER — SODIUM CHLORIDE 0.9 % IV SOLN
Freq: Once | INTRAVENOUS | Status: AC
  Filled 2021-03-27: qty 250

## 2021-03-27 MED ORDER — FAMOTIDINE 20 MG IN NS 100 ML IVPB
20.0000 mg | Freq: Once | INTRAVENOUS | Status: AC
  Administered 2021-03-27: 20 mg via INTRAVENOUS
  Filled 2021-03-27: qty 20

## 2021-03-27 NOTE — Patient Instructions (Addendum)
[facility]  Discharge Instructions: Thank you for choosing [facility] to provide your oncology and hematology care.  If you have a lab appointment with the [facility], please go directly to the [facility] and check in at the registration area.  Wear comfortable clothing and clothing appropriate for easy access to any Portacath or PICC line.   We strive to give you quality time with your provider. You may need to reschedule your appointment if you arrive late (15 or more minutes).  Arriving late affects you and other patients whose appointments are after yours.  Also, if you miss three or more appointments without notifying the office, you may be dismissed from the clinic at the provider's discretion.      For prescription refill requests, have your pharmacy contact our office and allow 72 hours for refills to be completed.    Today you received the following chemotherapy and/or immunotherapy agents;  Paclitaxel       To help prevent nausea and vomiting after your treatment, we encourage you to take your nausea medication as directed.  BELOW ARE SYMPTOMS THAT SHOULD BE REPORTED IMMEDIATELY: . *FEVER GREATER THAN 100.4 F (38 C) OR HIGHER . *CHILLS OR SWEATING . *NAUSEA AND VOMITING THAT IS NOT CONTROLLED WITH YOUR NAUSEA MEDICATION . *UNUSUAL SHORTNESS OF BREATH . *UNUSUAL BRUISING OR BLEEDING . *URINARY PROBLEMS (pain or burning when urinating, or frequent urination) . *BOWEL PROBLEMS (unusual diarrhea, constipation, pain near the anus) . TENDERNESS IN MOUTH AND THROAT WITH OR WITHOUT PRESENCE OF ULCERS (sore throat, sores in mouth, or a toothache) . UNUSUAL RASH, SWELLING OR PAIN  . UNUSUAL VAGINAL DISCHARGE OR ITCHING   Items with * indicate a potential emergency and should be followed up as soon as possible or go to the Emergency Department if any problems should occur.  Please show the CHEMOTHERAPY ALERT CARD or IMMUNOTHERAPY  ALERT CARD at check-in to the Emergency Department and triage nurse.  Should you have questions after your visit or need to cancel or reschedule your appointment, please contact [facility]  [phone] and follow the prompts.  Office hours are 8:00 a.m. to 4:30 p.m. Monday - Friday. Please note that voicemails left after 4:00 p.m. may not be returned until the following business day.  We are closed weekends and major holidays. You have access to a nurse at all times for urgent questions. Please call the main number to the clinic [phone] and follow the prompts.  For any non-urgent questions, you may also contact your provider using MyChart. We now offer e-Visits for anyone 54 and older to request care online for non-urgent symptoms. For details visit [email or web address].   Also download the MyChart app! Go to the app store, search "MyChart", open the app, select [facility], and log in with your MyChart username and password.  Due to Covid, a mask is required upon entering the hospital/clinic. If you do not have a mask, one will be given to you upon arrival. For doctor visits, patients may have 1 support person aged 83 or older with them. For treatment visits, patients cannot Paclitaxel injection What is this medicine? PACLITAXEL (PAK li TAX el) is a chemotherapy drug. It targets fast dividing cells, like cancer cells, and causes these cells to die. This medicine is used to treat ovarian cancer, breast cancer, lung cancer, Kaposi's sarcoma, and other cancers. This medicine may be used for other purposes; ask your health care  provider or pharmacist if you have questions. COMMON BRAND NAME(S): Onxol, Taxol What should I tell my health care provider before I take this medicine? They need to know if you have any of these conditions:  history of irregular heartbeat  liver disease  low blood counts, like low white cell, platelet, or red cell counts  lung  or breathing disease, like asthma  tingling of the fingers or toes, or other nerve disorder  an unusual or allergic reaction to paclitaxel, alcohol, polyoxyethylated castor oil, other chemotherapy, other medicines, foods, dyes, or preservatives  pregnant or trying to get pregnant  breast-feeding How should I use this medicine? This drug is given as an infusion into a vein. It is administered in a hospital or clinic by a specially trained health care professional. Talk to your pediatrician regarding the use of this medicine in children. Special care may be needed. Overdosage: If you think you have taken too much of this medicine contact a poison control center or emergency room at once. NOTE: This medicine is only for you. Do not share this medicine with others. What if I miss a dose? It is important not to miss your dose. Call your doctor or health care professional if you are unable to keep an appointment. What may interact with this medicine? Do not take this medicine with any of the following medications:  live virus vaccines This medicine may also interact with the following medications:  antiviral medicines for hepatitis, HIV or AIDS  certain antibiotics like erythromycin and clarithromycin  certain medicines for fungal infections like ketoconazole and itraconazole  certain medicines for seizures like carbamazepine, phenobarbital, phenytoin  gemfibrozil  nefazodone  rifampin  St. John's wort This list may not describe all possible interactions. Give your health care provider a list of all the medicines, herbs, non-prescription drugs, or dietary supplements you use. Also tell them if you smoke, drink alcohol, or use illegal drugs. Some items may interact with your medicine. What should I watch for while using this medicine? Your condition will be monitored carefully while you are receiving this medicine. You will need important blood work done while you are taking this  medicine. This medicine can cause serious allergic reactions. To reduce your risk you will need to take other medicine(s) before treatment with this medicine. If you experience allergic reactions like skin rash, itching or hives, swelling of the face, lips, or tongue, tell your doctor or health care professional right away. In some cases, you may be given additional medicines to help with side effects. Follow all directions for their use. This drug may make you feel generally unwell. This is not uncommon, as chemotherapy can affect healthy cells as well as cancer cells. Report any side effects. Continue your course of treatment even though you feel ill unless your doctor tells you to stop. Call your doctor or health care professional for advice if you get a fever, chills or sore throat, or other symptoms of a cold or flu. Do not treat yourself. This drug decreases your body's ability to fight infections. Try to avoid being around people who are sick. This medicine may increase your risk to bruise or bleed. Call your doctor or health care professional if you notice any unusual bleeding. Be careful brushing and flossing your teeth or using a toothpick because you may get an infection or bleed more easily. If you have any dental work done, tell your dentist you are receiving this medicine. Avoid taking products that contain aspirin,  acetaminophen, ibuprofen, naproxen, or ketoprofen unless instructed by your doctor. These medicines may hide a fever. Do not become pregnant while taking this medicine. Women should inform their doctor if they wish to become pregnant or think they might be pregnant. There is a potential for serious side effects to an unborn child. Talk to your health care professional or pharmacist for more information. Do not breast-feed an infant while taking this medicine. Men are advised not to father a child while receiving this medicine. This product may contain alcohol. Ask your pharmacist  or healthcare provider if this medicine contains alcohol. Be sure to tell all healthcare providers you are taking this medicine. Certain medicines, like metronidazole and disulfiram, can cause an unpleasant reaction when taken with alcohol. The reaction includes flushing, headache, nausea, vomiting, sweating, and increased thirst. The reaction can last from 30 minutes to several hours. What side effects may I notice from receiving this medicine? Side effects that you should report to your doctor or health care professional as soon as possible:  allergic reactions like skin rash, itching or hives, swelling of the face, lips, or tongue  breathing problems  changes in vision  fast, irregular heartbeat  high or low blood pressure  mouth sores  pain, tingling, numbness in the hands or feet  signs of decreased platelets or bleeding - bruising, pinpoint red spots on the skin, black, tarry stools, blood in the urine  signs of decreased red blood cells - unusually weak or tired, feeling faint or lightheaded, falls  signs of infection - fever or chills, cough, sore throat, pain or difficulty passing urine  signs and symptoms of liver injury like dark yellow or brown urine; general ill feeling or flu-like symptoms; light-colored stools; loss of appetite; nausea; right upper belly pain; unusually weak or tired; yellowing of the eyes or skin  swelling of the ankles, feet, hands  unusually slow heartbeat Side effects that usually do not require medical attention (report to your doctor or health care professional if they continue or are bothersome):  diarrhea  hair loss  loss of appetite  muscle or joint pain  nausea, vomiting  pain, redness, or irritation at site where injected  tiredness This list may not describe all possible side effects. Call your doctor for medical advice about side effects. You may report side effects to FDA at 1-800-FDA-1088. Where should I keep my  medicine? This drug is given in a hospital or clinic and will not be stored at home. NOTE: This sheet is a summary. It may not cover all possible information. If you have questions about this medicine, talk to your doctor, pharmacist, or health care provider.  2021 Elsevier/Gold Standard (2019-09-19 13:37:23) have anyone with them due to current Covid guidelines and our immunocompromised population.

## 2021-03-27 NOTE — Progress Notes (Signed)
Per MD ok to treat with HR 

## 2021-03-27 NOTE — Progress Notes (Signed)
Hematology/Oncology Consult note [facility]  Telephone:([phone] [phone]  Patient Care Team: Casilda Carls, MD as PCP - General (Internal Medicine) Casilda Carls, MD as Consulting Physician (Internal Medicine) Christene Lye, MD (General Surgery) Garrel Ridgel, [place] as Consulting Physician (Podiatry) Sindy Guadeloupe, MD as Consulting Physician (Hematology and Oncology)   Name of the patient: Michelle Walker  [id number]  1961-07-24   Date of visit: 03/27/21  Diagnosis- recurrent left breast cancer stage II [id number] ER/PR positive HER-2 negative  Chief complaint/ Reason for visit-on treatment assessment prior to cycle 9 of weekly Taxol chemotherapy  Heme/Onc history: Patient is a 60 year old female who was diagnosed with 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 She did not require adjuvant chemotherapy. She was initially on tamoxifen which she could not tolerate and was switched to letrozole. Last seen by me in June 2019 at which point she had a menstrual cycle and therefore not given another AI. She had subsequently did not follow-up with me and remained off hormone therapy.   More recently patient underwent a diagnostic bilateral mammogram which showed a 2.2 x 1.9 x 1.9 cm mass at the 12 o'clock position of the left breast 1 cm from the nipple. She was also found to have 5 other smaller breast masses ranging from 0.3 to 0.8 cm. Noted to have a solitary left axillary lymph node with cortical thickening. She had a biopsy of the dominant left breast mass as well as another smaller breast mass and the left axillary lymph node all of which were positive for metastatic invasive mammary carcinoma grade 2. Tumor was ER 91 200% positive PR 71 to 80% positive and HER-2 IHC equivocalbut negative by FISH  MRI of the bilateral breasts showed no abnormal findings in the  right breast. At least 6 discrete hypoechoic masses in the left breast with the largest one measuring 2.2 cm with 1 abnormal left axillary lymph node. The size of the other breast masses ranging from 28m to 1.4 cm. The total area of masses and non-mass enhancement was about 10 x 5 x 6.5 cm.CT chest abdomen and pelvis with contrast showed no evidence of distant metastatic disease. Bone scan was also negative.  MammaPrint done on the biopsy specimen came back as high risk with greater than 12% chemotherapy benefit.Plan is for neoadjuvant dose dense AC-Taxol chemotherapy followed by surgery.Start of chemotherapy was delayed since patient tested positive for Covid  Interim scans after 4 cycles of dose dense AC chemotherapy showed response to treatment   Interval history-patient reports doing well overall.  She does complain of some tingling and pain in her right big toe but is otherwise tolerating chemotherapy well  ECOG PS- 1 Pain scale- 2 Opioid associated constipation- no  Review of systems- Review of Systems  Constitutional: Positive for malaise/fatigue. Negative for chills, fever and weight loss.  HENT: Negative for congestion, ear discharge and nosebleeds.   Eyes: Negative for blurred vision.  Respiratory: Negative for cough, hemoptysis, sputum production, shortness of breath and wheezing.   Cardiovascular: Negative for chest pain, palpitations, orthopnea and claudication.  Gastrointestinal: Negative for abdominal pain, blood in stool, constipation, diarrhea, heartburn, melena, nausea and vomiting.  Genitourinary: Negative for dysuria, flank pain, frequency, hematuria and urgency.  Musculoskeletal: Negative for back pain, joint pain and myalgias.  Skin: Negative for rash.  Neurological: Negative for dizziness, tingling, focal weakness, seizures, weakness and headaches. Sensory change:  Peripheral neuropathy.  Endo/Heme/Allergies: Does not bruise/bleed easily.   Psychiatric/Behavioral: Negative for depression and suicidal ideas. The patient does not have insomnia.       Allergies  Allergen Reactions  . Triamcinolone Other (See Comments)    Hypopigmentation s/p steroid injection  . Tape Rash    SURGICAL TAPE AFTER BREAST BIOPSY     Past Medical History:  Diagnosis Date  . Anemia   . Arthritis   . 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 Wide excision/ RT, T1a,N0. ER/PR pos, Her 2.neg. Tamoxifen x 6 months, d/c secondary to vasomotor sympotms.   . Headache   . Hypertension   . Personal history of radiation therapy 2016   RIGHT lumpectomy  . Thyroid disease      Past Surgical History:  Procedure Laterality Date  . ANTERIOR CRUCIATE LIGAMENT REPAIR Right   . BREAST BIOPSY Right 07/17/2015   INVASIVE LOBULAR CARCINOMA, CLASSIC TYPE (1.5 MM), ARISING IN A   . BREAST BIOPSY Left 09/19/2020   [place] bx 3 areas/ 1oc q clip/ 12 oc vision clip, axilla lymph node hydromark shape 3/ path pending  . BREAST LUMPECTOMY Right 08/22/2015  . BREAST LUMPECTOMY WITH SENTINEL LYMPH NODE BIOPSY Right 08/22/2015   Procedure: BREAST LUMPECTOMY WITH SENTINEL LYMPH NODE BX;  Surgeon: Christene Lye, MD;  Location: [facility];  Service: General;  Laterality: Right;  . COLONOSCOPY W/ POLYPECTOMY  2014  . COLONOSCOPY WITH PROPOFOL N/A 04/16/2019   Procedure: COLONOSCOPY WITH BIOPSIES;  Surgeon: Lucilla Lame, MD;  Location: [facility];  Service: Endoscopy;  Laterality: N/A;  . DILATION AND CURETTAGE OF UTERUS     20 years ago  . NOVASURE ABLATION    . POLYPECTOMY N/A 04/16/2019   Procedure: POLYPECTOMY;  Surgeon: Lucilla Lame, MD;  Location: [facility];  Service: Endoscopy;  Laterality: N/A;  . PORTACATH PLACEMENT Right 12/01/2020   Procedure: INSERTION PORT-A-CATH;  Surgeon: [name], MD;  Location: [facility];  Service: General;  Laterality: Right;    Social History   Socioeconomic  History  . Marital status: Married    Spouse name: Not on file  . Number of children: Not on file  . Years of education: Not on file  . Highest education level: Not on file  Occupational History  . Not on file  Tobacco Use  . Smoking status: Current Every Day Smoker    Packs/day: 0.50    Years: 40.00    Pack years: 20.00    Types: Cigarettes  . Smokeless tobacco: Never Used  Vaping Use  . Vaping Use: Never used  Substance and Sexual Activity  . Alcohol use: Yes    Alcohol/week: 2.0 standard drinks    Types: 2 Standard drinks or equivalent per week    Comment: BEER 1-2 QD  . Drug use: No  . Sexual activity: Yes    Comment: ablation  Other Topics Concern  . Not on file  Social History Narrative  . Not on file   Social Determinants of Health   Financial Resource Strain: Not on file  Food Insecurity: Not on file  Transportation Needs: Not on file  Physical Activity: Not on file  Stress: Not on file  Social Connections: Not on file  Intimate Partner Violence: Not on file    Family History  Problem Relation Age of Onset  . Stroke Mother   . Cancer Sister        sarcoma on arm;  chemo  . Diabetes Sister   . Stroke Sister   . Diabetes Brother   . Breast cancer Neg Hx   . Ovarian cancer Neg Hx   . Colon cancer Neg Hx   . Heart disease Neg Hx      Current Outpatient Medications:  .  allopurinol (ZYLOPRIM) 100 MG tablet, Take 100 mg by mouth at bedtime., Disp: , Rfl:  .  amLODipine (NORVASC) 10 MG tablet, Take 10 mg by mouth at bedtime. , Disp: , Rfl: 0 .  Calcium Carb-Cholecalciferol (CALCIUM 600+D3 PO), Take 1 tablet by mouth at bedtime., Disp: , Rfl:  .  ibuprofen (ADVIL) 800 MG tablet, Take 1 tablet (800 mg total) by mouth every 8 (eight) hours as needed., Disp: 30 tablet, Rfl: 0 .  lidocaine-prilocaine (EMLA) cream, Apply to affected area once, Disp: 30 g, Rfl: 3 .  losartan (COZAAR) 100 MG tablet, Take 100 mg by mouth at bedtime. , Disp: , Rfl: 0 .  metoprolol  succinate (TOPROL-XL) 25 MG 24 hr tablet, Take 50 mg by mouth at bedtime., Disp: , Rfl: 0 .  potassium chloride SA (KLOR-CON) 20 MEQ tablet, TAKE 1 TABLET(20 MEQ) BY MOUTH DAILY, Disp: 30 tablet, Rfl: 0 .  dexamethasone (DECADRON) 4 MG tablet, Take 2 tablets (8 mg total) by mouth daily. Take daily for 3 days after chemo. Take with food. (Patient not taking: No sig reported), Disp: 30 tablet, Rfl: 1 .  HYDROcodone-acetaminophen (NORCO/VICODIN) 5-325 MG tablet, Take 1 tablet by mouth every 6 (six) hours as needed for moderate pain. (Patient not taking: No sig reported), Disp: 15 tablet, Rfl: 0 .  LORazepam (ATIVAN) 0.5 MG tablet, Take 1 tablet (0.5 mg total) by mouth every 6 (six) hours as needed (Nausea or vomiting). (Patient not taking: No sig reported), Disp: 30 tablet, Rfl: 0 .  ondansetron (ZOFRAN) 8 MG tablet, Take 1 tablet (8 mg total) by mouth 2 (two) times daily as needed. Start on the third day after chemotherapy. (Patient not taking: No sig reported), Disp: 30 tablet, Rfl: 1 .  prochlorperazine (COMPAZINE) 10 MG tablet, Take 1 tablet (10 mg total) by mouth every 6 (six) hours as needed (Nausea or vomiting). (Patient not taking: No sig reported), Disp: 30 tablet, Rfl: 1 .  PROVENTIL HFA 108 (90 Base) MCG/ACT inhaler, Inhale 2 puffs into the lungs every 6 (six) hours as needed (wheezing/shortness of breath.). (Patient not taking: No sig reported), Disp: , Rfl:  No current facility-administered medications for this visit.  Facility-Administered Medications Ordered in Other Visits:  .  heparin lock flush 100 unit/mL, 500 Units, Intracatheter, Once PRN, Sindy Guadeloupe, MD .  sodium chloride flush (NS) 0.9 % injection 10 mL, 10 mL, Intravenous, PRN, Sindy Guadeloupe, MD, 10 mL at 03/27/21 0813  Physical exam:  Vitals:   03/27/21 0854  BP: (!) 147/86  Pulse: (!) 104  Resp: 16  Temp: 98 F (36.7 C)  Weight: 194 lb (88 kg)  Height: '5\' 6"'  (1.676 m)   Physical Exam Cardiovascular:     Rate and  Rhythm: Normal rate and regular rhythm.     Heart sounds: Normal heart sounds.  Pulmonary:     Effort: Pulmonary effort is normal.     Breath sounds: Normal breath sounds.  Abdominal:     General: Bowel sounds are normal.     Palpations: Abdomen is soft.  Skin:    General: Skin is warm and dry.  Neurological:     Mental Status:  She is alert and oriented to person, place, and time.      CMP Latest Ref Rng & Units 03/27/2021  Glucose 70 - 99 mg/dL 91  BUN 6 - 20 mg/dL 15  Creatinine 0.44 - 1.00 mg/dL 0.63  Sodium 135 - 145 mmol/L 138  Potassium 3.5 - 5.1 mmol/L 3.5  Chloride 98 - 111 mmol/L 104  CO2 22 - 32 mmol/L 23  Calcium 8.9 - 10.3 mg/dL 9.5  Total Protein 6.5 - 8.1 g/dL 7.0  Total Bilirubin 0.3 - 1.2 mg/dL 0.4  Alkaline Phos 38 - 126 U/L 60  AST 15 - 41 U/L 18  ALT 0 - 44 U/L 10   CBC Latest Ref Rng & Units 03/27/2021  WBC 4.0 - 10.5 K/uL 4.7  Hemoglobin 12.0 - 15.0 g/dL 11.5(L)  Hematocrit 36.0 - 46.0 % 35.2(L)  Platelets 150 - 400 K/uL 225     Assessment and plan- Patient is a 60 y.o. female  prior history of right breast cancer s/p lumpectomy now with anatomical stage IIAleft breast invasive mammary carcinoma [id number] ER/PR positive and HER-2 negative.she is s/p 4 cycles of dose dense AC chemotherapy.  She is here for on treatment assessment prior to cycle 9 of weekly Taxol chemotherapy  Counts okay to proceed with cycle 9 of weekly Taxol chemotherapy today.  She will directly proceed for cycle 10 next week and will be seen by covering MD/NP in 2 weeks for cycle 11 and proceed with labs and treatment for cycle 12 in 3 weeks.  I will see her tentatively 5 weeks from now with labs prior to her surgery.  I have also informed Dr. Christian Mate that she is completing chemotherapy in 3 weeks.  She will need a left mastectomy given that she had multiple masses noted on mammogram as well as MRI with a sentinel lymph node biopsy.  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  She is getting a reduced dose of Taxol to 65 mg per metered square.  If neuropathy worsens at her next visit it would be okay to withhold further doses of Taxol Visit Diagnosis 1. Encounter for antineoplastic chemotherapy   2. Malignant neoplasm of upper-outer quadrant of right breast in female, estrogen receptor positive ([place])   3. Chemotherapy-induced peripheral neuropathy ([place])      Dr. [name], MD, MPH [facility] [phone] 03/27/2021 12:51 PM

## 2021-03-27 NOTE — Progress Notes (Signed)
Pt doing good but her right foot with right big toe hurting #3

## 2021-03-31 ENCOUNTER — Other Ambulatory Visit: Payer: 59

## 2021-03-31 ENCOUNTER — Ambulatory Visit: Payer: 59

## 2021-03-31 ENCOUNTER — Ambulatory Visit: Payer: 59 | Admitting: Internal Medicine

## 2021-04-02 ENCOUNTER — Telehealth: Payer: Self-pay | Admitting: *Deleted

## 2021-04-02 ENCOUNTER — Other Ambulatory Visit: Payer: 59

## 2021-04-02 ENCOUNTER — Inpatient Hospital Stay: Payer: 59

## 2021-04-02 ENCOUNTER — Inpatient Hospital Stay: Payer: 59 | Attending: Oncology

## 2021-04-02 VITALS — BP 113/79 | HR 97 | Temp 97.0°F | Resp 18 | Wt 191.0 lb

## 2021-04-02 DIAGNOSIS — Z79811 Long term (current) use of aromatase inhibitors: Secondary | ICD-10-CM | POA: Insufficient documentation

## 2021-04-02 DIAGNOSIS — Z170 Estrogen receptor positive status [ER+]: Secondary | ICD-10-CM | POA: Insufficient documentation

## 2021-04-02 DIAGNOSIS — Z923 Personal history of irradiation: Secondary | ICD-10-CM | POA: Insufficient documentation

## 2021-04-02 DIAGNOSIS — I10 Essential (primary) hypertension: Secondary | ICD-10-CM | POA: Insufficient documentation

## 2021-04-02 DIAGNOSIS — C50812 Malignant neoplasm of overlapping sites of left female breast: Secondary | ICD-10-CM | POA: Insufficient documentation

## 2021-04-02 DIAGNOSIS — Z79899 Other long term (current) drug therapy: Secondary | ICD-10-CM | POA: Diagnosis not present

## 2021-04-02 DIAGNOSIS — Z5111 Encounter for antineoplastic chemotherapy: Secondary | ICD-10-CM | POA: Diagnosis present

## 2021-04-02 DIAGNOSIS — F17210 Nicotine dependence, cigarettes, uncomplicated: Secondary | ICD-10-CM | POA: Insufficient documentation

## 2021-04-02 LAB — CBC WITH DIFFERENTIAL/PLATELET
Abs Immature Granulocytes: 0.06 K/uL (ref 0.00–0.07)
Basophils Absolute: 0.1 K/uL (ref 0.0–0.1)
Basophils Relative: 1 %
Eosinophils Absolute: 0.1 K/uL (ref 0.0–0.5)
Eosinophils Relative: 2 %
HCT: 36.1 % (ref 36.0–46.0)
Hemoglobin: 11.6 g/dL — ABNORMAL LOW (ref 12.0–15.0)
Immature Granulocytes: 1 %
Lymphocytes Relative: 13 %
Lymphs Abs: 0.7 K/uL (ref 0.7–4.0)
MCH: 29.1 pg (ref 26.0–34.0)
MCHC: 32.1 g/dL (ref 30.0–36.0)
MCV: 90.5 fL (ref 80.0–100.0)
Monocytes Absolute: 0.4 K/uL (ref 0.1–1.0)
Monocytes Relative: 7 %
Neutro Abs: 4.2 K/uL (ref 1.7–7.7)
Neutrophils Relative %: 76 %
Platelets: 192 K/uL (ref 150–400)
RBC: 3.99 MIL/uL (ref 3.87–5.11)
RDW: 14.7 % (ref 11.5–15.5)
WBC: 5.5 K/uL (ref 4.0–10.5)
nRBC: 0 % (ref 0.0–0.2)

## 2021-04-02 LAB — COMPREHENSIVE METABOLIC PANEL WITH GFR
ALT: 10 U/L (ref 0–44)
AST: 18 U/L (ref 15–41)
Albumin: 3.7 g/dL (ref 3.5–5.0)
Alkaline Phosphatase: 59 U/L (ref 38–126)
Anion gap: 10 (ref 5–15)
BUN: 9 mg/dL (ref 6–20)
CO2: 23 mmol/L (ref 22–32)
Calcium: 9.2 mg/dL (ref 8.9–10.3)
Chloride: 105 mmol/L (ref 98–111)
Creatinine, Ser: 0.53 mg/dL (ref 0.44–1.00)
GFR, Estimated: >60 mL/min
Glucose, Bld: 101 mg/dL — ABNORMAL HIGH (ref 70–99)
Potassium: 3.3 mmol/L — ABNORMAL LOW (ref 3.5–5.1)
Sodium: 138 mmol/L (ref 135–145)
Total Bilirubin: 0.6 mg/dL (ref 0.3–1.2)
Total Protein: 6.6 g/dL (ref 6.5–8.1)

## 2021-04-02 MED ORDER — HEPARIN SOD (PORK) LOCK FLUSH 100 UNIT/ML IV SOLN
INTRAVENOUS | Status: AC
  Filled 2021-04-02: qty 5

## 2021-04-02 MED ORDER — FAMOTIDINE 20 MG IN NS 100 ML IVPB
20.0000 mg | Freq: Once | INTRAVENOUS | Status: AC
  Administered 2021-04-02: 20 mg via INTRAVENOUS
  Filled 2021-04-02: qty 20

## 2021-04-02 MED ORDER — SODIUM CHLORIDE 0.9 % IV SOLN
20.0000 mg | Freq: Once | INTRAVENOUS | Status: AC
  Administered 2021-04-02: 20 mg via INTRAVENOUS
  Filled 2021-04-02: qty 20

## 2021-04-02 MED ORDER — HEPARIN SOD (PORK) LOCK FLUSH 100 UNIT/ML IV SOLN
500.0000 [IU] | Freq: Once | INTRAVENOUS | Status: AC | PRN
  Administered 2021-04-02: 500 [IU]
  Filled 2021-04-02: qty 5

## 2021-04-02 MED ORDER — SODIUM CHLORIDE 0.9% FLUSH
10.0000 mL | Freq: Once | INTRAVENOUS | Status: AC
  Administered 2021-04-02: 10 mL via INTRAVENOUS
  Filled 2021-04-02: qty 10

## 2021-04-02 MED ORDER — SODIUM CHLORIDE 0.9 % IV SOLN
65.0000 mg/m2 | Freq: Once | INTRAVENOUS | Status: AC
  Administered 2021-04-02: 132 mg via INTRAVENOUS
  Filled 2021-04-02: qty 22

## 2021-04-02 MED ORDER — SODIUM CHLORIDE 0.9 % IV SOLN
Freq: Once | INTRAVENOUS | Status: AC
  Filled 2021-04-02: qty 250

## 2021-04-02 MED ORDER — DIPHENHYDRAMINE HCL 50 MG/ML IJ SOLN
50.0000 mg | Freq: Once | INTRAMUSCULAR | Status: AC
Start: 2021-04-02 — End: 2021-04-02
  Administered 2021-04-02: 50 mg via INTRAVENOUS
  Filled 2021-04-02: qty 1

## 2021-04-02 NOTE — Patient Instructions (Signed)
[facility]    Discharge Instructions: Thank you for choosing [facility] to provide your oncology and hematology care.  If you have a lab appointment with the [facility], please go directly to the [facility] and check in at the registration area.  Wear comfortable clothing and clothing appropriate for easy access to any Portacath or PICC line.   We strive to give you quality time with your provider. You may need to reschedule your appointment if you arrive late (15 or more minutes).  Arriving late affects you and other patients whose appointments are after yours.  Also, if you miss three or more appointments without notifying the office, you may be dismissed from the clinic at the provider's discretion.      For prescription refill requests, have your pharmacy contact our office and allow 72 hours for refills to be completed.    Today you received the following chemotherapy and/or immunotherapy agents - Taxol   To help prevent nausea and vomiting after your treatment, we encourage you to take your nausea medication as directed.  BELOW ARE SYMPTOMS THAT SHOULD BE REPORTED IMMEDIATELY: . *FEVER GREATER THAN 100.4 F (38 C) OR HIGHER . *CHILLS OR SWEATING . *NAUSEA AND VOMITING THAT IS NOT CONTROLLED WITH YOUR NAUSEA MEDICATION . *UNUSUAL SHORTNESS OF BREATH . *UNUSUAL BRUISING OR BLEEDING . *URINARY PROBLEMS (pain or burning when urinating, or frequent urination) . *BOWEL PROBLEMS (unusual diarrhea, constipation, pain near the anus) . TENDERNESS IN MOUTH AND THROAT WITH OR WITHOUT PRESENCE OF ULCERS (sore throat, sores in mouth, or a toothache) . UNUSUAL RASH, SWELLING OR PAIN  . UNUSUAL VAGINAL DISCHARGE OR ITCHING   Items with * indicate a potential emergency and should be followed up as soon as possible or go to the Emergency Department if any problems should occur.  Please show the CHEMOTHERAPY ALERT CARD or IMMUNOTHERAPY ALERT  CARD at check-in to the Emergency Department and triage nurse.  Should you have questions after your visit or need to cancel or reschedule your appointment, please contact [facility]  [phone] and follow the prompts.  Office hours are 8:00 a.m. to 4:30 p.m. Monday - Friday. Please note that voicemails left after 4:00 p.m. may not be returned until the following business day.  We are closed weekends and major holidays. You have access to a nurse at all times for urgent questions. Please call the main number to the clinic [phone] and follow the prompts.  For any non-urgent questions, you may also contact your provider using MyChart. We now offer e-Visits for anyone 55 and older to request care online for non-urgent symptoms. For details visit [email or web address].   Also download the MyChart app! Go to the app store, search "MyChart", open the app, select [facility], and log in with your MyChart username and password.  Due to Covid, a mask is required upon entering the hospital/clinic. If you do not have a mask, one will be given to you upon arrival. For doctor visits, patients may have 1 support person aged 86 or older with them. For treatment visits, patients cannot have anyone with them due to current Covid guidelines and our immunocompromised population.   Paclitaxel injection What is this medicine? PACLITAXEL (PAK li TAX el) is a chemotherapy drug. It targets fast dividing cells, like cancer cells, and causes these cells to die. This medicine is used to treat ovarian cancer, breast cancer, lung cancer, Kaposi's sarcoma, and other  cancers. This medicine may be used for other purposes; ask your health care provider or pharmacist if you have questions. COMMON BRAND NAME(S): Onxol, Taxol What should I tell my health care provider before I take this medicine? They need to know if you have any of these conditions:  history of irregular  heartbeat  liver disease  low blood counts, like low Seng Larch cell, platelet, or red cell counts  lung or breathing disease, like asthma  tingling of the fingers or toes, or other nerve disorder  an unusual or allergic reaction to paclitaxel, alcohol, polyoxyethylated castor oil, other chemotherapy, other medicines, foods, dyes, or preservatives  pregnant or trying to get pregnant  breast-feeding How should I use this medicine? This drug is given as an infusion into a vein. It is administered in a hospital or clinic by a specially trained health care professional. Talk to your pediatrician regarding the use of this medicine in children. Special care may be needed. Overdosage: If you think you have taken too much of this medicine contact a poison control center or emergency room at once. NOTE: This medicine is only for you. Do not share this medicine with others. What if I miss a dose? It is important not to miss your dose. Call your doctor or health care professional if you are unable to keep an appointment. What may interact with this medicine? Do not take this medicine with any of the following medications:  live virus vaccines This medicine may also interact with the following medications:  antiviral medicines for hepatitis, HIV or AIDS  certain antibiotics like erythromycin and clarithromycin  certain medicines for fungal infections like ketoconazole and itraconazole  certain medicines for seizures like carbamazepine, phenobarbital, phenytoin  gemfibrozil  nefazodone  rifampin  St. John's wort This list may not describe all possible interactions. Give your health care provider a list of all the medicines, herbs, non-prescription drugs, or dietary supplements you use. Also tell them if you smoke, drink alcohol, or use illegal drugs. Some items may interact with your medicine. What should I watch for while using this medicine? Your condition will be monitored carefully  while you are receiving this medicine. You will need important blood work done while you are taking this medicine. This medicine can cause serious allergic reactions. To reduce your risk you will need to take other medicine(s) before treatment with this medicine. If you experience allergic reactions like skin rash, itching or hives, swelling of the face, lips, or tongue, tell your doctor or health care professional right away. In some cases, you may be given additional medicines to help with side effects. Follow all directions for their use. This drug may make you feel generally unwell. This is not uncommon, as chemotherapy can affect healthy cells as well as cancer cells. Report any side effects. Continue your course of treatment even though you feel ill unless your doctor tells you to stop. Call your doctor or health care professional for advice if you get a fever, chills or sore throat, or other symptoms of a cold or flu. Do not treat yourself. This drug decreases your body's ability to fight infections. Try to avoid being around people who are sick. This medicine may increase your risk to bruise or bleed. Call your doctor or health care professional if you notice any unusual bleeding. Be careful brushing and flossing your teeth or using a toothpick because you may get an infection or bleed more easily. If you have any dental work done, tell  your dentist you are receiving this medicine. Avoid taking products that contain aspirin, acetaminophen, ibuprofen, naproxen, or ketoprofen unless instructed by your doctor. These medicines may hide a fever. Do not become pregnant while taking this medicine. Women should inform their doctor if they wish to become pregnant or think they might be pregnant. There is a potential for serious side effects to an unborn child. Talk to your health care professional or pharmacist for more information. Do not breast-feed an infant while taking this medicine. Men are advised not  to father a child while receiving this medicine. This product may contain alcohol. Ask your pharmacist or healthcare provider if this medicine contains alcohol. Be sure to tell all healthcare providers you are taking this medicine. Certain medicines, like metronidazole and disulfiram, can cause an unpleasant reaction when taken with alcohol. The reaction includes flushing, headache, nausea, vomiting, sweating, and increased thirst. The reaction can last from 30 minutes to several hours. What side effects may I notice from receiving this medicine? Side effects that you should report to your doctor or health care professional as soon as possible:  allergic reactions like skin rash, itching or hives, swelling of the face, lips, or tongue  breathing problems  changes in vision  fast, irregular heartbeat  high or low blood pressure  mouth sores  pain, tingling, numbness in the hands or feet  signs of decreased platelets or bleeding - bruising, pinpoint red spots on the skin, black, tarry stools, blood in the urine  signs of decreased red blood cells - unusually weak or tired, feeling faint or lightheaded, falls  signs of infection - fever or chills, cough, sore throat, pain or difficulty passing urine  signs and symptoms of liver injury like dark yellow or brown urine; general ill feeling or flu-like symptoms; light-colored stools; loss of appetite; nausea; right upper belly pain; unusually weak or tired; yellowing of the eyes or skin  swelling of the ankles, feet, hands  unusually slow heartbeat Side effects that usually do not require medical attention (report to your doctor or health care professional if they continue or are bothersome):  diarrhea  hair loss  loss of appetite  muscle or joint pain  nausea, vomiting  pain, redness, or irritation at site where injected  tiredness This list may not describe all possible side effects. Call your doctor for medical advice about  side effects. You may report side effects to FDA at 1-800-FDA-1088. Where should I keep my medicine? This drug is given in a hospital or clinic and will not be stored at home. NOTE: This sheet is a summary. It may not cover all possible information. If you have questions about this medicine, talk to your doctor, pharmacist, or health care provider.  2021 Elsevier/Gold Standard (2019-09-19 13:37:23)

## 2021-04-02 NOTE — Telephone Encounter (Signed)
Called the patient and got her voicemail and left message that potassium 3.3 today . Wanted to make sure she is taking potassium pill every day. It  Looks like that she has enough to get all the way up to next appt next week. On the labs your labs are normal every other time you come. Just  make sure that you take potassium and we will check again next week

## 2021-04-02 NOTE — Progress Notes (Signed)
Pt received taxol infusion in clinic today. Tolerated well. No complaints at d/c. °

## 2021-04-03 ENCOUNTER — Inpatient Hospital Stay: Payer: 59

## 2021-04-09 ENCOUNTER — Other Ambulatory Visit: Payer: Self-pay

## 2021-04-09 ENCOUNTER — Encounter: Payer: Self-pay | Admitting: Surgery

## 2021-04-09 ENCOUNTER — Ambulatory Visit: Payer: 59 | Admitting: Surgery

## 2021-04-09 ENCOUNTER — Ambulatory Visit: Payer: Self-pay | Admitting: Surgery

## 2021-04-09 VITALS — BP 147/91 | HR 105 | Temp 98.4°F | Ht 65.0 in | Wt 194.0 lb

## 2021-04-09 DIAGNOSIS — C50412 Malignant neoplasm of upper-outer quadrant of left female breast: Secondary | ICD-10-CM

## 2021-04-09 DIAGNOSIS — Z170 Estrogen receptor positive status [ER+]: Secondary | ICD-10-CM

## 2021-04-09 NOTE — Patient Instructions (Addendum)
If you have any concerns or questions, please feel free to call our office. See follow up appointment below.    Surgical Options for Early-Stage Breast Cancer  Surgery is usually the first treatment for early-stage breast cancer. Most women have two surgery options. One is called partial mastectomy, or breast-sparing or breast-conserving surgery, and the other is called mastectomy. Both surgeries have good survival rates. Breast cancer is different for everyone, even in its early stage. The best treatment for one person might not be the best treatment for another. Learn as much as you can about your cancer and work closely with your health care providers to make the choice that produces the best results for you. What is partial mastectomy? Partial mastectomy, also called breast-sparing surgery or breast-conserving surgery, is surgery to remove the cancer along with some normal breast tissue that surrounds it. Lymph nodes from under the arm may also be removed and tested to find out if the cancer has spread. If cancer is located near the chest wall, part of the chest wall lining may also be removed. What is a mastectomy? A mastectomy is surgery to remove the cancer along with the entire breast tissue. There are several types of mastectomy: Simple or total mastectomy. In this surgery the entire breast is removed, including breast tissue, nipple, areola and skin around the breast. Some lymph nodes may also be removed from under the arm. If cancer is located near the chest wall, part of the chest wall lining may also be removed. Skin-sparing mastectomy. In this surgery the breast tissue, nipple, and areola are removed and most of the skin over the breast is left in place. This surgery results in less scar tissue than other mastectomy surgeries, which allows for a more natural breast reconstruction. Nipple-sparing mastectomy. In this surgery, breast tissue is removed but the skin and nipple is left in  place. The tissue under the nipple and areola may be removed if cancer is found in the area. This may be an option for women who choose to have breast reconstruction after mastectomy. Modified radical mastectomy. This surgery is the same as a simple mastectomy but also includes removing lymph nodes from under the arm (axillary lymph node dissection). Radical mastectomy. In this surgery the entire breast, the lymph nodes under the arm, and the chest wall muscles under the breast are removed. This surgery is rarely done now. A modified radical mastectomy is preferred because it is just as effective, but with the added advantage of fewer side effects. What are some advantages and disadvantages of these surgeries? Partial mastectomy Advantages of partial mastectomy include: Keeping most of your breast tissue intact, allowing for a more natural look to the breast. Easier recovery when compared to a mastectomy. Ability to go home on the day of the procedure. Disadvantages of partial mastectomy include: Slightly higher risk that your cancer will come back. Needing more surgery at a later time. Requiring radiation therapy after surgery, which has side effects and possible complications. This is done to reduce the chances of breast cancer returning. Mastectomy Advantages of a mastectomy include: Not needing to have radiation therapy or other treatments after surgery. Lower chances of your cancer coming back. Disadvantages of a mastectomy include: Longer recovery time compared to partial mastectomy. Possibility of more complications. Requiring additional surgeries to reconstruct your breast. Questions to ask Here are some questions to ask about each surgery: What will my recovery be like? How will my breast look and feel? What are  the possible risks and complications of the surgery? What additional treatment might I need after surgery? What are the risks and complications of radiation therapy? What  are the risks and complications of chemotherapy? Will I be able to have breast reconstruction? Where to find more information [place]: https://www.cancer.gov American Cancer Society: http://www.cancer.org Summary Surgery is usually the first treatment for early-stage breast cancer. Most women have two surgery options. One option is called partial mastectomy, or breast-sparing or breast-conserving surgery, and the other is called mastectomy. Both surgeries have good survival rates. Each option has advantages and disadvantages to consider. The best treatment for one person might not be the best treatment for you. Learn as much as you can about your cancer and work closely with your health care providers to make the choice that produces the best results for you. This information is not intended to replace advice given to you by your health care provider. Make sure you discuss any questions you have with your health care provider. Document Revised: 04/02/2020 Document Reviewed: 04/02/2020 Elsevier Patient Education  2021 [employer].

## 2021-04-09 NOTE — Progress Notes (Signed)
Michelle Walker is nearing the end of her neoadjuvant chemotherapy.  She has 2 more treatments this month.  She reports she had an ultrasound evaluation of the progress she is made.  She reports she appears to be tolerating and rebounding from her treatment well. We reviewed her desires for surgery which include total mastectomy without any immediate reconstruction. We rehash the need for removal of a sufficient number of lymph nodes, including the sentinel lymph node and the previously biopsied lymph node.  Were expectant that an axillary dissection will be necessary, however that will depend on intraoperative findings as well as pathology report. We will schedule follow-up July 5 for preoperative finalization.

## 2021-04-10 ENCOUNTER — Inpatient Hospital Stay: Payer: 59

## 2021-04-10 ENCOUNTER — Inpatient Hospital Stay (HOSPITAL_BASED_OUTPATIENT_CLINIC_OR_DEPARTMENT_OTHER): Payer: 59 | Admitting: Oncology

## 2021-04-10 ENCOUNTER — Encounter: Payer: Self-pay | Admitting: Oncology

## 2021-04-10 ENCOUNTER — Other Ambulatory Visit: Payer: Self-pay | Admitting: Surgery

## 2021-04-10 VITALS — BP 134/90 | HR 97 | Resp 18

## 2021-04-10 VITALS — BP 166/100 | HR 108 | Temp 97.2°F | Resp 20 | Wt 196.1 lb

## 2021-04-10 DIAGNOSIS — Z5111 Encounter for antineoplastic chemotherapy: Secondary | ICD-10-CM | POA: Diagnosis not present

## 2021-04-10 DIAGNOSIS — Z170 Estrogen receptor positive status [ER+]: Secondary | ICD-10-CM

## 2021-04-10 DIAGNOSIS — C50812 Malignant neoplasm of overlapping sites of left female breast: Secondary | ICD-10-CM

## 2021-04-10 DIAGNOSIS — C50412 Malignant neoplasm of upper-outer quadrant of left female breast: Secondary | ICD-10-CM

## 2021-04-10 LAB — CBC WITH DIFFERENTIAL/PLATELET
Abs Immature Granulocytes: 0.1 10*3/uL — ABNORMAL HIGH (ref 0.00–0.07)
Basophils Absolute: 0 10*3/uL (ref 0.0–0.1)
Basophils Relative: 0 %
Eosinophils Absolute: 0.1 10*3/uL (ref 0.0–0.5)
Eosinophils Relative: 1 %
HCT: 35.1 % — ABNORMAL LOW (ref 36.0–46.0)
Hemoglobin: 11.6 g/dL — ABNORMAL LOW (ref 12.0–15.0)
Immature Granulocytes: 2 %
Lymphocytes Relative: 16 %
Lymphs Abs: 0.8 10*3/uL (ref 0.7–4.0)
MCH: 29.7 pg (ref 26.0–34.0)
MCHC: 33 g/dL (ref 30.0–36.0)
MCV: 90 fL (ref 80.0–100.0)
Monocytes Absolute: 0.4 10*3/uL (ref 0.1–1.0)
Monocytes Relative: 9 %
Neutro Abs: 3.7 10*3/uL (ref 1.7–7.7)
Neutrophils Relative %: 72 %
Platelets: 251 10*3/uL (ref 150–400)
RBC: 3.9 MIL/uL (ref 3.87–5.11)
RDW: 14.6 % (ref 11.5–15.5)
WBC: 5.1 10*3/uL (ref 4.0–10.5)
nRBC: 0 % (ref 0.0–0.2)

## 2021-04-10 LAB — COMPREHENSIVE METABOLIC PANEL
ALT: 10 U/L (ref 0–44)
AST: 18 U/L (ref 15–41)
Albumin: 3.7 g/dL (ref 3.5–5.0)
Alkaline Phosphatase: 71 U/L (ref 38–126)
Anion gap: 9 (ref 5–15)
BUN: 15 mg/dL (ref 6–20)
CO2: 23 mmol/L (ref 22–32)
Calcium: 9.6 mg/dL (ref 8.9–10.3)
Chloride: 106 mmol/L (ref 98–111)
Creatinine, Ser: 0.61 mg/dL (ref 0.44–1.00)
GFR, Estimated: >60 mL/min (ref >60)
Glucose, Bld: 108 mg/dL — ABNORMAL HIGH (ref 70–99)
Potassium: 3.8 mmol/L (ref 3.5–5.1)
Sodium: 138 mmol/L (ref 135–145)
Total Bilirubin: 0.3 mg/dL (ref 0.3–1.2)
Total Protein: 6.8 g/dL (ref 6.5–8.1)

## 2021-04-10 MED ORDER — SODIUM CHLORIDE 0.9% FLUSH
10.0000 mL | INTRAVENOUS | Status: DC | PRN
  Administered 2021-04-10: 10 mL via INTRAVENOUS
  Filled 2021-04-10: qty 10

## 2021-04-10 MED ORDER — DIPHENHYDRAMINE HCL 50 MG/ML IJ SOLN
50.0000 mg | Freq: Once | INTRAMUSCULAR | Status: AC
  Administered 2021-04-10: 50 mg via INTRAVENOUS
  Filled 2021-04-10: qty 1

## 2021-04-10 MED ORDER — FAMOTIDINE 20 MG IN NS 100 ML IVPB
20.0000 mg | Freq: Once | INTRAVENOUS | Status: AC
  Administered 2021-04-10: 20 mg via INTRAVENOUS
  Filled 2021-04-10: qty 20

## 2021-04-10 MED ORDER — HEPARIN SOD (PORK) LOCK FLUSH 100 UNIT/ML IV SOLN
INTRAVENOUS | Status: AC
  Filled 2021-04-10: qty 5

## 2021-04-10 MED ORDER — HEPARIN SOD (PORK) LOCK FLUSH 100 UNIT/ML IV SOLN
500.0000 [IU] | Freq: Once | INTRAVENOUS | Status: AC
Start: 2021-04-10 — End: 2021-04-10
  Administered 2021-04-10: 500 [IU] via INTRAVENOUS
  Filled 2021-04-10: qty 5

## 2021-04-10 MED ORDER — SODIUM CHLORIDE 0.9 % IV SOLN
20.0000 mg | Freq: Once | INTRAVENOUS | Status: AC
  Administered 2021-04-10: 20 mg via INTRAVENOUS
  Filled 2021-04-10: qty 20

## 2021-04-10 MED ORDER — SODIUM CHLORIDE 0.9 % IV SOLN
Freq: Once | INTRAVENOUS | Status: AC
  Filled 2021-04-10: qty 250

## 2021-04-10 MED ORDER — SODIUM CHLORIDE 0.9 % IV SOLN
65.0000 mg/m2 | Freq: Once | INTRAVENOUS | Status: AC
  Administered 2021-04-10: 132 mg via INTRAVENOUS
  Filled 2021-04-10: qty 22

## 2021-04-10 NOTE — Progress Notes (Signed)
Hematology/Oncology Consult note [facility]  [phone] [phone]  Patient Care Team: Casilda Carls, MD as PCP - General (Internal Medicine) Casilda Carls, MD as Consulting Physician (Internal Medicine) Christene Lye, MD (General Surgery) Garrel Ridgel, [place] as Consulting Physician (Podiatry) Sindy Guadeloupe, MD as Consulting Physician (Hematology and Oncology)   Name of the patient: Michelle Walker  [id number]  [birth year]   Date of visit: 04/12/21  Diagnosis- recurrent left breast cancer stage II [id number] ER/PR positive HER-2 negative  Chief complaint/ Reason for visit-on treatment assessment prior to cycle 11 of weekly Taxol chemotherapy  Heme/Onc history: Patient is a 60 year old female who was diagnosed with 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  She did not require adjuvant chemotherapy.  She was initially on tamoxifen which she could not tolerate and was switched to letrozole.  Last seen by me in June 2019 at which point she had a menstrual cycle and therefore not given another AI.  She had subsequently did not follow-up with me and remained off hormone therapy.     More recently patient underwent a diagnostic bilateral mammogram which showed a 2.2 x 1.9 x 1.9 cm mass at the 12 o'clock position of the left breast 1 cm from the nipple.  She was also found to have 5 other smaller breast masses ranging from 0.3 to 0.8 cm.  Noted to have a solitary left axillary lymph node with cortical thickening.  She had a biopsy of the dominant left breast mass as well as another smaller breast mass and the left axillary lymph node all of which were positive for metastatic invasive mammary carcinoma grade 2.  Tumor was ER 91 200% positive PR 71 to 80% positive and HER-2 IHC equivocal but negative by FISH   MRI of the bilateral breasts showed no abnormal findings in  the right breast.  At least 6 discrete hypoechoic masses in the left breast with the largest one measuring 2.2 cm with 1 abnormal left axillary lymph node.  The size of the other breast masses ranging from 7m to 1.4 cm.  The total area of masses and non-mass enhancement was about 10 x 5 x 6.5 cm.  CT chest abdomen and pelvis with contrast showed no evidence of distant metastatic disease.  Bone scan was also negative.   MammaPrint done on the biopsy specimen came back as high risk with greater than 12% chemotherapy benefit.  Plan is for neoadjuvant dose dense AC-Taxol chemotherapy followed by surgery.  Start of chemotherapy was delayed since patient tested positive for Covid   Interim scans after 4 cycles of dose dense AC chemotherapy showed response to treatment    Interval history-reports doing well with her treatments. She has persistent right foot neuropathy but none in her left foot. Reports fluctuating BP which is normal for her. She is taking her BP med's as prescribed.   ECOG PS- 1 Pain scale- 2 Opioid associated constipation- no  Review of systems- Review of Systems  Constitutional:  Positive for malaise/fatigue. Negative for chills, fever and weight loss.  HENT:  Negative for congestion, ear discharge and nosebleeds.   Eyes:  Negative for blurred vision.  Respiratory:  Negative for cough, hemoptysis, sputum production, shortness of breath and wheezing.   Cardiovascular:  Negative for chest pain, palpitations, orthopnea and claudication.  Gastrointestinal:  Negative for abdominal pain, blood in stool, constipation, diarrhea, heartburn, melena, nausea and  vomiting.  Genitourinary:  Negative for dysuria, flank pain, frequency, hematuria and urgency.  Musculoskeletal:  Negative for back pain, joint pain and myalgias.  Skin:  Negative for rash.  Neurological:  Negative for dizziness, tingling, focal weakness, seizures, weakness and headaches. Sensory change: Peripheral  neuropathy. Endo/Heme/Allergies:  Does not bruise/bleed easily.  Psychiatric/Behavioral:  Negative for depression and suicidal ideas. The patient does not have insomnia.      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 Wide excision/ RT, T1a,N0. ER/PR pos, Her 2.neg. Tamoxifen x 6 months, d/c secondary to vasomotor sympotms.    Headache    Hypertension    Personal history of radiation therapy 2016   RIGHT lumpectomy   Thyroid disease      Past Surgical History:  Procedure Laterality Date   ANTERIOR CRUCIATE LIGAMENT REPAIR Right    BREAST BIOPSY Right 07/17/2015   INVASIVE LOBULAR CARCINOMA, CLASSIC TYPE (1.5 MM), ARISING IN A    BREAST BIOPSY Left 09/19/2020   [place] bx 3 areas/ 1oc q clip/ 12 oc vision clip, axilla lymph node hydromark shape 3/ path pending   BREAST LUMPECTOMY Right 08/22/2015   BREAST LUMPECTOMY WITH SENTINEL LYMPH NODE BIOPSY Right 08/22/2015   Procedure: BREAST LUMPECTOMY WITH SENTINEL LYMPH NODE BX;  Surgeon: Christene Lye, MD;  Location: [facility];  Service: General;  Laterality: Right;   COLONOSCOPY W/ POLYPECTOMY  2014   COLONOSCOPY WITH PROPOFOL N/A 04/16/2019   Procedure: COLONOSCOPY WITH BIOPSIES;  Surgeon: Lucilla Lame, MD;  Location: [place];  Service: Endoscopy;  Laterality: N/A;   DILATION AND CURETTAGE OF UTERUS     20 years ago   [place]     POLYPECTOMY N/A 04/16/2019   Procedure: POLYPECTOMY;  Surgeon: Lucilla Lame, MD;  Location: [place];  Service: Endoscopy;  Laterality: N/A;   PORTACATH PLACEMENT Right 12/01/2020   Procedure: INSERTION PORT-A-CATH;  Surgeon: [name], MD;  Location: [facility];  Service: General;  Laterality: Right;     Social History   Socioeconomic History   Marital status: Married    Spouse name: Not on file   Number of children: Not on file   Years of education: Not on file   Highest education level: Not on file  Occupational History   Not on file  Tobacco Use   Smoking status: Every Day    Packs/day: 0.50    Years: 40.00    Pack years: 20.00    Types: Cigarettes   Smokeless tobacco: Never  Vaping Use   Vaping Use: Never used  Substance and Sexual Activity   Alcohol use: Yes    Alcohol/week: 2.0 standard drinks    Types: 2 Standard drinks or equivalent per week    Comment: BEER 1-2 QD   Drug use: No   Sexual activity: Yes    Comment: ablation  Other Topics Concern   Not on file  Social History Narrative   Not on file   Social Determinants of Health   Financial Resource Strain: Not on file  Food Insecurity: Not on file  Transportation Needs: Not on file  Physical Activity: Not on file  Stress: Not on file  Social Connections: Not on file  Intimate Partner Violence: Not on file    Family History  Problem Relation Age of Onset   Stroke Mother    Cancer Sister        sarcoma on arm; chemo   Diabetes Sister    Stroke Sister    Diabetes Brother    Breast cancer Neg Hx    Ovarian cancer Neg Hx    Colon cancer Neg Hx    Heart disease Neg Hx      Current Outpatient Medications:    allopurinol (ZYLOPRIM) 100 MG tablet, Take 100 mg by mouth at bedtime., Disp: , Rfl:    amLODipine (NORVASC) 10 MG tablet, Take 10 mg by mouth at bedtime. , Disp: , Rfl: 0   Calcium Carb-Cholecalciferol (CALCIUM 600+D3 PO), Take 1 tablet by mouth at bedtime., Disp: , Rfl:    dexamethasone (DECADRON) 4 MG tablet, Take 2 tablets (8 mg total) by mouth daily. Take daily for 3 days after chemo. Take with food., Disp: 30 tablet, Rfl: 1   lidocaine-prilocaine (EMLA) cream, Apply to affected area once, Disp: 30 g, Rfl: 3   losartan (COZAAR) 100 MG tablet, Take 100 mg by mouth at bedtime. , Disp: ,  Rfl: 0   metoprolol succinate (TOPROL-XL) 25 MG 24 hr tablet, Take 50 mg by mouth at bedtime., Disp: , Rfl: 0   potassium chloride SA (KLOR-CON) 20 MEQ tablet, TAKE 1 TABLET(20 MEQ) BY MOUTH DAILY, Disp: 30 tablet, Rfl: 0   PROVENTIL HFA 108 (90 Base) MCG/ACT inhaler, Inhale 2 puffs into the lungs every 6 (six) hours as needed (wheezing/shortness of breath.)., Disp: , Rfl:  No current facility-administered medications for this visit.  Facility-Administered Medications Ordered in Other Visits:    sodium chloride flush (NS) 0.9 % injection 10 mL, 10 mL, Intravenous, PRN, Sindy Guadeloupe, MD, 10 mL at 03/27/21 0813  Physical exam:  Vitals:   04/10/21 0859  BP: (!) 166/100  Pulse: (!) 108  Resp: 20  Temp: (!) 97.2 F (36.2 C)  TempSrc: Tympanic  Weight: 196 lb 1.6 oz (89 kg)   Physical Exam Cardiovascular:     Rate and Rhythm: Normal rate and regular rhythm.     Heart sounds: Normal heart sounds.  Pulmonary:     Effort: Pulmonary effort is normal.     Breath sounds: Normal breath sounds.  Abdominal:     General: Bowel sounds are normal.     Palpations: Abdomen is soft.  Skin:    General: Skin is warm and dry.  Neurological:     Mental Status: She is alert and oriented to person, place, and time.     CMP Latest Ref Rng & Units 04/10/2021  Glucose 70 - 99 mg/dL 108(H)  BUN 6 - 20 mg/dL 15  Creatinine 0.44 - 1.00 mg/dL 0.61  Sodium 135 - 145 mmol/L 138  Potassium 3.5 - 5.1 mmol/L 3.8  Chloride 98 - 111 mmol/L 106  CO2 22 - 32 mmol/L 23  Calcium 8.9 - 10.3 mg/dL 9.6  Total Protein 6.5 - 8.1 g/dL 6.8  Total Bilirubin 0.3 - 1.2 mg/dL 0.3  Alkaline Phos 38 - 126 U/L 71  AST 15 - 41 U/L 18  ALT 0 - 44 U/L 10   CBC Latest Ref Rng & Units 04/10/2021  WBC 4.0 - 10.5 K/uL 5.1  Hemoglobin 12.0 - 15.0 g/dL 11.6(L)  Hematocrit 36.0 - 46.0 % 35.1(L)  Platelets 150 - 400 K/uL 251  Assessment and plan- Patient is a 60 y.o. female  prior history of right breast cancer s/p  lumpectomy now with anatomical stage IIA left breast invasive mammary carcinoma [id number] ER/PR positive and HER-2 negative. She is s/p 4 cycles of dose dense AC chemotherapy.  She is here for on treatment assessment prior to cycle 11 of weekly Taxol chemotherapy.  Counts are okay to proceed with cycle 11 of weekly Taxol.  She is already scheduled for cycle 12 of Taxol which will be her last treatment.  She will see Dr. Janese Banks back on 05/01/2021 with labs prior to mastectomy with Dr. Christian Mate.  She is scheduled for her mastectomy on 05/06/2021.  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  Stable today. She is getting a reduced dose of Taxol to 65 mg per metered square.  If neuropathy worsens at her next visit it would be okay to withhold further doses of Taxol.  Hypertension: I have asked she reach out to her PCP for adjustment of BP medications. BP today is 166/100. She also has an elevated heart rate. Not symptomatic.   Greater than 50% was spent in counseling and coordination of care with this patient including but not limited to discussion of the relevant topics above (See A&P) including, but not limited to diagnosis and management of acute and chronic medical conditions.   Visit Diagnosis 1. Malignant neoplasm of overlapping sites of left breast in female, estrogen receptor positive ([place])     Faythe Casa, NP 04/12/2021 2:57 PM

## 2021-04-10 NOTE — Patient Instructions (Signed)
[facility] [place] [facility]  Discharge Instructions: Thank you for choosing [facility] to provide your oncology and hematology care.  If you have a lab appointment with the Cancer Center, please go directly to the Cancer Center and check in at the registration area.  Wear comfortable clothing and clothing appropriate for easy access to any Portacath or PICC line.   We strive to give you quality time with your provider. You may need to reschedule your appointment if you arrive late (15 or more minutes).  Arriving late affects you and other patients whose appointments are after yours.  Also, if you miss three or more appointments without notifying the office, you may be dismissed from the clinic at the provider's discretion.      For prescription refill requests, have your pharmacy contact our office and allow 72 hours for refills to be completed.    Today you received the following chemotherapy and/or immunotherapy agents TAXOL      To help prevent nausea and vomiting after your treatment, we encourage you to take your nausea medication as directed.  BELOW ARE SYMPTOMS THAT SHOULD BE REPORTED IMMEDIATELY: *FEVER GREATER THAN 100.4 F (38 C) OR HIGHER *CHILLS OR SWEATING *NAUSEA AND VOMITING THAT IS NOT CONTROLLED WITH YOUR NAUSEA MEDICATION *UNUSUAL SHORTNESS OF BREATH *UNUSUAL BRUISING OR BLEEDING *URINARY PROBLEMS (pain or burning when urinating, or frequent urination) *BOWEL PROBLEMS (unusual diarrhea, constipation, pain near the anus) TENDERNESS IN MOUTH AND THROAT WITH OR WITHOUT PRESENCE OF ULCERS (sore throat, sores in mouth, or a toothache) UNUSUAL RASH, SWELLING OR PAIN  UNUSUAL VAGINAL DISCHARGE OR ITCHING   Items with * indicate a potential emergency and should be followed up as soon as possible or go to the Emergency Department if any problems should occur.  Please show the CHEMOTHERAPY ALERT CARD or IMMUNOTHERAPY ALERT CARD at check-in to  the Emergency Department and triage nurse.  Should you have questions after your visit or need to cancel or reschedule your appointment, please contact [facility] [place] [facility]  [phone] and follow the prompts.  Office hours are 8:00 a.m. to 4:30 p.m. Monday - Friday. Please note that voicemails left after 4:00 p.m. may not be returned until the following business day.  We are closed weekends and major holidays. You have access to a nurse at all times for urgent questions. Please call the main number to the clinic [phone] and follow the prompts.  For any non-urgent questions, you may also contact your provider using MyChart. We now offer e-Visits for anyone 18 and older to request care online for non-urgent symptoms. For details visit [email or web address].   Also download the MyChart app! Go to the app store, search "MyChart", open the app, select [facility], and log in with your MyChart username and password.  Due to Covid, a mask is required upon entering the hospital/clinic. If you do not have a mask, one will be given to you upon arrival. For doctor visits, patients may have 1 support person aged 18 or older with them. For treatment visits, patients cannot have anyone with them due to current Covid guidelines and our immunocompromised population.   Paclitaxel injection What is this medication? PACLITAXEL (PAK li TAX el) is a chemotherapy drug. It targets fast dividing cells, like cancer cells, and causes these cells to die. This medicine is used to treat ovarian cancer, breast cancer, lung cancer, Kaposi's sarcoma, and other cancers. This medicine may be used for other purposes; ask   your health care provider or pharmacist if you have questions. COMMON BRAND NAME(S): Onxol, Taxol What should I tell my care team before I take this medication? They need to know if you have any of these conditions: history of irregular heartbeat liver disease low blood counts,  like low white cell, platelet, or red cell counts lung or breathing disease, like asthma tingling of the fingers or toes, or other nerve disorder an unusual or allergic reaction to paclitaxel, alcohol, polyoxyethylated castor oil, other chemotherapy, other medicines, foods, dyes, or preservatives pregnant or trying to get pregnant breast-feeding How should I use this medication? This drug is given as an infusion into a vein. It is administered in a hospital or clinic by a specially trained health care professional. Talk to your pediatrician regarding the use of this medicine in children. Special care may be needed. Overdosage: If you think you have taken too much of this medicine contact a poison control center or emergency room at once. NOTE: This medicine is only for you. Do not share this medicine with others. What if I miss a dose? It is important not to miss your dose. Call your doctor or health care professional if you are unable to keep an appointment. What may interact with this medication? Do not take this medicine with any of the following medications: live virus vaccines This medicine may also interact with the following medications: antiviral medicines for hepatitis, HIV or AIDS certain antibiotics like erythromycin and clarithromycin certain medicines for fungal infections like ketoconazole and itraconazole certain medicines for seizures like carbamazepine, phenobarbital, phenytoin gemfibrozil nefazodone rifampin St. John's wort This list may not describe all possible interactions. Give your health care provider a list of all the medicines, herbs, non-prescription drugs, or dietary supplements you use. Also tell them if you smoke, drink alcohol, or use illegal drugs. Some items may interact with your medicine. What should I watch for while using this medication? Your condition will be monitored carefully while you are receiving this medicine. You will need important blood work  done while you are taking this medicine. This medicine can cause serious allergic reactions. To reduce your risk you will need to take other medicine(s) before treatment with this medicine. If you experience allergic reactions like skin rash, itching or hives, swelling of the face, lips, or tongue, tell your doctor or health care professional right away. In some cases, you may be given additional medicines to help with side effects. Follow all directions for their use. This drug may make you feel generally unwell. This is not uncommon, as chemotherapy can affect healthy cells as well as cancer cells. Report any side effects. Continue your course of treatment even though you feel ill unless your doctor tells you to stop. Call your doctor or health care professional for advice if you get a fever, chills or sore throat, or other symptoms of a cold or flu. Do not treat yourself. This drug decreases your body's ability to fight infections. Try to avoid being around people who are sick. This medicine may increase your risk to bruise or bleed. Call your doctor or health care professional if you notice any unusual bleeding. Be careful brushing and flossing your teeth or using a toothpick because you may get an infection or bleed more easily. If you have any dental work done, tell your dentist you are receiving this medicine. Avoid taking products that contain aspirin, acetaminophen, ibuprofen, naproxen, or ketoprofen unless instructed by your doctor. These medicines may hide a   fever. Do not become pregnant while taking this medicine. Women should inform their doctor if they wish to become pregnant or think they might be pregnant. There is a potential for serious side effects to an unborn child. Talk to your health care professional or pharmacist for more information. Do not breast-feed an infant while taking this medicine. Men are advised not to father a child while receiving this medicine. This product may  contain alcohol. Ask your pharmacist or healthcare provider if this medicine contains alcohol. Be sure to tell all healthcare providers you are taking this medicine. Certain medicines, like metronidazole and disulfiram, can cause an unpleasant reaction when taken with alcohol. The reaction includes flushing, headache, nausea, vomiting, sweating, and increased thirst. The reaction can last from 30 minutes to several hours. What side effects may I notice from receiving this medication? Side effects that you should report to your doctor or health care professional as soon as possible: allergic reactions like skin rash, itching or hives, swelling of the face, lips, or tongue breathing problems changes in vision fast, irregular heartbeat high or low blood pressure mouth sores pain, tingling, numbness in the hands or feet signs of decreased platelets or bleeding - bruising, pinpoint red spots on the skin, black, tarry stools, blood in the urine signs of decreased red blood cells - unusually weak or tired, feeling faint or lightheaded, falls signs of infection - fever or chills, cough, sore throat, pain or difficulty passing urine signs and symptoms of liver injury like dark yellow or brown urine; general ill feeling or flu-like symptoms; light-colored stools; loss of appetite; nausea; right upper belly pain; unusually weak or tired; yellowing of the eyes or skin swelling of the ankles, feet, hands unusually slow heartbeat Side effects that usually do not require medical attention (report to your doctor or health care professional if they continue or are bothersome): diarrhea hair loss loss of appetite muscle or joint pain nausea, vomiting pain, redness, or irritation at site where injected tiredness This list may not describe all possible side effects. Call your doctor for medical advice about side effects. You may report side effects to FDA at 1-800-FDA-1088. Where should I keep my  medication? This drug is given in a hospital or clinic and will not be stored at home. NOTE: This sheet is a summary. It may not cover all possible information. If you have questions about this medicine, talk to your doctor, pharmacist, or health care provider.  2022 Elsevier/Gold Standard (2019-09-19 13:37:23)  

## 2021-04-10 NOTE — Progress Notes (Signed)
Patient denies any concerns today.  

## 2021-04-10 NOTE — Progress Notes (Signed)
RN recheck vitals HR 97 134/90

## 2021-04-12 ENCOUNTER — Encounter: Payer: Self-pay | Admitting: Oncology

## 2021-04-13 ENCOUNTER — Telehealth: Payer: Self-pay | Admitting: Surgery

## 2021-04-13 NOTE — Telephone Encounter (Signed)
Patient has been advised of Pre-Admission date/time, COVID Testing date and Surgery date.  Surgery Date: 05/06/21, arrive 12:00 for SLN injection prior to same day surgery  Preadmission Testing Date: 04/27/21 (phone 8a-1p) Covid Testing Date: 05/05/21 @ 8:30 am, patient advised to go to the [place] ([place])   As patient will be having SLN injection prior to her surgery on 05/06/21 she is informed to arrive @ 12:00.  Patient verbalized understanding.

## 2021-04-17 ENCOUNTER — Inpatient Hospital Stay: Payer: 59

## 2021-04-17 ENCOUNTER — Other Ambulatory Visit: Payer: Self-pay

## 2021-04-17 VITALS — BP 141/93 | HR 106 | Temp 98.2°F | Resp 18 | Wt 195.2 lb

## 2021-04-17 DIAGNOSIS — Z5111 Encounter for antineoplastic chemotherapy: Secondary | ICD-10-CM | POA: Diagnosis not present

## 2021-04-17 DIAGNOSIS — C50812 Malignant neoplasm of overlapping sites of left female breast: Secondary | ICD-10-CM

## 2021-04-17 LAB — COMPREHENSIVE METABOLIC PANEL
ALT: 10 U/L (ref 0–44)
AST: 18 U/L (ref 15–41)
Albumin: 3.7 g/dL (ref 3.5–5.0)
Alkaline Phosphatase: 60 U/L (ref 38–126)
Anion gap: 8 (ref 5–15)
BUN: 13 mg/dL (ref 6–20)
CO2: 25 mmol/L (ref 22–32)
Calcium: 9.8 mg/dL (ref 8.9–10.3)
Chloride: 106 mmol/L (ref 98–111)
Creatinine, Ser: 0.7 mg/dL (ref 0.44–1.00)
GFR, Estimated: >60 mL/min (ref >60)
Glucose, Bld: 95 mg/dL (ref 70–99)
Potassium: 3.4 mmol/L — ABNORMAL LOW (ref 3.5–5.1)
Sodium: 139 mmol/L (ref 135–145)
Total Bilirubin: 0.3 mg/dL (ref 0.3–1.2)
Total Protein: 6.8 g/dL (ref 6.5–8.1)

## 2021-04-17 LAB — CBC WITH DIFFERENTIAL/PLATELET
Abs Immature Granulocytes: 0.06 10*3/uL (ref 0.00–0.07)
Basophils Absolute: 0 10*3/uL (ref 0.0–0.1)
Basophils Relative: 1 %
Eosinophils Absolute: 0.1 10*3/uL (ref 0.0–0.5)
Eosinophils Relative: 2 %
HCT: 35.3 % — ABNORMAL LOW (ref 36.0–46.0)
Hemoglobin: 11.5 g/dL — ABNORMAL LOW (ref 12.0–15.0)
Immature Granulocytes: 1 %
Lymphocytes Relative: 21 %
Lymphs Abs: 1 10*3/uL (ref 0.7–4.0)
MCH: 29.3 pg (ref 26.0–34.0)
MCHC: 32.6 g/dL (ref 30.0–36.0)
MCV: 89.8 fL (ref 80.0–100.0)
Monocytes Absolute: 0.4 10*3/uL (ref 0.1–1.0)
Monocytes Relative: 9 %
Neutro Abs: 3.1 10*3/uL (ref 1.7–7.7)
Neutrophils Relative %: 66 %
Platelets: 216 10*3/uL (ref 150–400)
RBC: 3.93 MIL/uL (ref 3.87–5.11)
RDW: 14.2 % (ref 11.5–15.5)
WBC: 4.7 10*3/uL (ref 4.0–10.5)
nRBC: 0 % (ref 0.0–0.2)

## 2021-04-17 MED ORDER — SODIUM CHLORIDE 0.9 % IV SOLN
65.0000 mg/m2 | Freq: Once | INTRAVENOUS | Status: AC
  Administered 2021-04-17: 132 mg via INTRAVENOUS
  Filled 2021-04-17: qty 22

## 2021-04-17 MED ORDER — SODIUM CHLORIDE 0.9 % IV SOLN
Freq: Once | INTRAVENOUS | Status: AC
  Filled 2021-04-17: qty 250

## 2021-04-17 MED ORDER — DIPHENHYDRAMINE HCL 50 MG/ML IJ SOLN
50.0000 mg | Freq: Once | INTRAMUSCULAR | Status: AC
  Administered 2021-04-17: 50 mg via INTRAVENOUS
  Filled 2021-04-17: qty 1

## 2021-04-17 MED ORDER — HEPARIN SOD (PORK) LOCK FLUSH 100 UNIT/ML IV SOLN
INTRAVENOUS | Status: AC
  Filled 2021-04-17: qty 5

## 2021-04-17 MED ORDER — HEPARIN SOD (PORK) LOCK FLUSH 100 UNIT/ML IV SOLN
500.0000 [IU] | Freq: Once | INTRAVENOUS | Status: AC | PRN
  Administered 2021-04-17: 500 [IU]
  Filled 2021-04-17: qty 5

## 2021-04-17 MED ORDER — FAMOTIDINE 20 MG IN NS 100 ML IVPB
20.0000 mg | Freq: Once | INTRAVENOUS | Status: AC
  Administered 2021-04-17: 20 mg via INTRAVENOUS
  Filled 2021-04-17: qty 20

## 2021-04-17 MED ORDER — DEXAMETHASONE SODIUM PHOSPHATE 100 MG/10ML IJ SOLN
20.0000 mg | Freq: Once | INTRAMUSCULAR | Status: AC
Start: 2021-04-17 — End: 2021-04-17
  Administered 2021-04-17: 20 mg via INTRAVENOUS
  Filled 2021-04-17: qty 20

## 2021-04-17 NOTE — Patient Instructions (Signed)
[facility]  Discharge Instructions: Thank you for choosing [facility] to provide your oncology and hematology care.  If you have a lab appointment with the [facility], please go directly to the [facility] and check in at the registration area.  Wear comfortable clothing and clothing appropriate for easy access to any Portacath or PICC line.   We strive to give you quality time with your provider. You may need to reschedule your appointment if you arrive late (15 or more minutes).  Arriving late affects you and other patients whose appointments are after yours.  Also, if you miss three or more appointments without notifying the office, you may be dismissed from the clinic at the provider's discretion.      For prescription refill requests, have your pharmacy contact our office and allow 72 hours for refills to be completed.    Today you received the following chemotherapy : Taxol   To help prevent nausea and vomiting after your treatment, we encourage you to take your nausea medication as directed.  BELOW ARE SYMPTOMS THAT SHOULD BE REPORTED IMMEDIATELY: *FEVER GREATER THAN 100.4 F (38 C) OR HIGHER *CHILLS OR SWEATING *NAUSEA AND VOMITING THAT IS NOT CONTROLLED WITH YOUR NAUSEA MEDICATION *UNUSUAL SHORTNESS OF BREATH *UNUSUAL BRUISING OR BLEEDING *URINARY PROBLEMS (pain or burning when urinating, or frequent urination) *BOWEL PROBLEMS (unusual diarrhea, constipation, pain near the anus) TENDERNESS IN MOUTH AND THROAT WITH OR WITHOUT PRESENCE OF ULCERS (sore throat, sores in mouth, or a toothache) UNUSUAL RASH, SWELLING OR PAIN  UNUSUAL VAGINAL DISCHARGE OR ITCHING   Items with * indicate a potential emergency and should be followed up as soon as possible or go to the Emergency Department if any problems should occur.  Please show the CHEMOTHERAPY ALERT CARD or IMMUNOTHERAPY ALERT CARD at check-in to the Emergency Department and  triage nurse.  Should you have questions after your visit or need to cancel or reschedule your appointment, please contact [facility]  [phone] and follow the prompts.  Office hours are 8:00 a.m. to 4:30 p.m. Monday - Friday. Please note that voicemails left after 4:00 p.m. may not be returned until the following business day.  We are closed weekends and major holidays. You have access to a nurse at all times for urgent questions. Please call the main number to the clinic [phone] and follow the prompts.  For any non-urgent questions, you may also contact your provider using MyChart. We now offer e-Visits for anyone 41 and older to request care online for non-urgent symptoms. For details visit [email or web address].   Also download the MyChart app! Go to the app store, search "MyChart", open the app, select [facility], and log in with your MyChart username and password.  Due to Covid, a mask is required upon entering the hospital/clinic. If you do not have a mask, one will be given to you upon arrival. For doctor visits, patients may have 1 support person aged 94 or older with them. For treatment visits, patients cannot have anyone with them due to current Covid guidelines and our immunocompromised population.

## 2021-04-17 NOTE — Progress Notes (Signed)
HR 106 ok to proceed per MD 

## 2021-04-27 ENCOUNTER — Telehealth: Payer: Self-pay | Admitting: *Deleted

## 2021-04-27 ENCOUNTER — Other Ambulatory Visit: Payer: Self-pay

## 2021-04-27 ENCOUNTER — Other Ambulatory Visit
Admission: RE | Admit: 2021-04-27 | Discharge: 2021-04-27 | Disposition: A | Discharge status: Home / Self Care | Payer: 59 | Source: Ambulatory Visit | Attending: Surgery | Admitting: Surgery

## 2021-04-27 NOTE — Telephone Encounter (Signed)
Faxed FMLA to [place] at [phone]

## 2021-04-27 NOTE — Patient Instructions (Addendum)
Your procedure is scheduled on: May 06, 2021 [facility] Report to the Registration Desk on the 1st floor of the [facility] AT 12:00 PM   PT NEEDS TO GO TO NUCLEAR MED    REMEMBER: Instructions that are not followed completely may result in serious medical risk, up to and including death; or upon the discretion of your surgeon and anesthesiologist your surgery may need to be rescheduled.  DO NOT EAT OR DRINK after midnight the night before surgery.  No gum chewing, lozengers or hard candies.  TAKE THESE MEDICATIONS THE MORNING OF SURGERY WITH A SIP OF WATER: NONE  One week prior to surgery: Stop Anti-inflammatories (NSAIDS) such as Advil, Aleve, Ibuprofen, Motrin, Naproxen, Naprosyn and ASPIRIN OR Aspirin based products such as Excedrin, Goodys Powder, BC Powder. Stop ANY OVER THE COUNTER supplements until after surgery. You may however, continue to take Tylenol if needed for pain up until the day of surgery.  No Alcohol for 24 hours before or after surgery.  No Smoking including e-cigarettes for 24 hours prior to surgery.  No chewable tobacco products for at least 6 hours prior to surgery.  No nicotine patches on the day of surgery.  Do not use any "recreational" drugs for at least a week prior to your surgery.  Please be advised that the combination of cocaine and anesthesia may have negative outcomes, up to and including death. If you test positive for cocaine, your surgery will be cancelled.  On the morning of surgery brush your teeth with toothpaste and water, you may rinse your mouth with mouthwash if you wish. Do not swallow any toothpaste or mouthwash.  Do not wear jewelry, make-up, hairpins, clips or nail polish.  Do not wear lotions, powders, or perfumes OR DEODORANT   Do not shave body from the neck down 48 hours prior to surgery just in case you cut yourself which could leave a site for infection.  Also, freshly shaved skin may become irritated if using the CHG  soap.  Contact lenses, hearing aids and dentures may not be worn into surgery.  Do not bring valuables to the hospital. [facility] is not responsible for any missing/lost belongings or valuables.   Use CHG Soap as directed on instruction sheet.  Notify your doctor if there is any change in your medical condition (cold, fever, infection).  Wear comfortable clothing (specific to your surgery type) to the hospital.  After surgery, you can help prevent lung complications by doing breathing exercises.  Take deep breaths and cough every 1-2 hours. Your doctor may order a device called an Incentive Spirometer to help you take deep breaths. When coughing or sneezing, hold a pillow firmly against your incision with both hands. This is called "splinting." Doing this helps protect your incision. It also decreases belly discomfort.  If you are being admitted to the hospital overnight, Michelle Walker.  If you are being discharged the day of surgery, you will not be allowed to drive home. You will need a responsible adult (18 years or older) to drive you home and stay with you that night.   If you are taking public transportation, you will need to have a responsible adult (18 years or older) with you. Please confirm with your physician that it is acceptable to use public transportation.   Please call the [facility] at [phone] if you have any questions about these instructions.  Surgery Visitation Policy:  Patients undergoing a surgery  or procedure may have one family member or support person with them as long as that person is not COVID-19 positive or experiencing its symptoms.  That person may remain in the waiting area during the procedure.  Inpatient Visitation:    Visiting hours are 7 a.m. to 8 p.m. Inpatients will be allowed two visitors daily. The visitors may change each day during the patient's stay. No visitors under the age of 6. Any  visitor under the age of 63 must be accompanied by an adult. The visitor must pass COVID-19 screenings, use hand sanitizer when entering and exiting the patient's room and wear a mask at all times, including in the patient's room. Patients must also wear a mask when staff or their visitor are in the room. Masking is required regardless of vaccination status.

## 2021-05-01 ENCOUNTER — Inpatient Hospital Stay: Payer: 59 | Attending: Oncology

## 2021-05-01 ENCOUNTER — Inpatient Hospital Stay (HOSPITAL_BASED_OUTPATIENT_CLINIC_OR_DEPARTMENT_OTHER): Payer: 59 | Admitting: Oncology

## 2021-05-01 ENCOUNTER — Ambulatory Visit
Admission: RE | Admit: 2021-05-01 | Discharge: 2021-05-01 | Disposition: A | Discharge status: Home / Self Care | Payer: 59 | Source: Ambulatory Visit | Attending: Oncology | Admitting: Oncology

## 2021-05-01 ENCOUNTER — Other Ambulatory Visit: Payer: Self-pay

## 2021-05-01 ENCOUNTER — Encounter: Payer: Self-pay | Admitting: Oncology

## 2021-05-01 VITALS — BP 145/99 | HR 100 | Temp 98.2°F | Resp 20

## 2021-05-01 DIAGNOSIS — C50812 Malignant neoplasm of overlapping sites of left female breast: Secondary | ICD-10-CM

## 2021-05-01 DIAGNOSIS — M79662 Pain in left lower leg: Secondary | ICD-10-CM | POA: Diagnosis present

## 2021-05-01 DIAGNOSIS — C50012 Malignant neoplasm of nipple and areola, left female breast: Secondary | ICD-10-CM | POA: Diagnosis not present

## 2021-05-01 DIAGNOSIS — M7989 Other specified soft tissue disorders: Secondary | ICD-10-CM

## 2021-05-01 DIAGNOSIS — Z170 Estrogen receptor positive status [ER+]: Secondary | ICD-10-CM | POA: Diagnosis not present

## 2021-05-01 LAB — COMPREHENSIVE METABOLIC PANEL
ALT: 9 U/L (ref 0–44)
AST: 18 U/L (ref 15–41)
Albumin: 3.6 g/dL (ref 3.5–5.0)
Alkaline Phosphatase: 66 U/L (ref 38–126)
Anion gap: 8 (ref 5–15)
BUN: 9 mg/dL (ref 6–20)
CO2: 23 mmol/L (ref 22–32)
Calcium: 9.5 mg/dL (ref 8.9–10.3)
Chloride: 107 mmol/L (ref 98–111)
Creatinine, Ser: 0.68 mg/dL (ref 0.44–1.00)
GFR, Estimated: >60 mL/min (ref >60)
Glucose, Bld: 105 mg/dL — ABNORMAL HIGH (ref 70–99)
Potassium: 3.7 mmol/L (ref 3.5–5.1)
Sodium: 138 mmol/L (ref 135–145)
Total Bilirubin: 0.7 mg/dL (ref 0.3–1.2)
Total Protein: 6.8 g/dL (ref 6.5–8.1)

## 2021-05-01 LAB — CBC WITH DIFFERENTIAL/PLATELET
Abs Immature Granulocytes: 0.03 10*3/uL (ref 0.00–0.07)
Basophils Absolute: 0 10*3/uL (ref 0.0–0.1)
Basophils Relative: 1 %
Eosinophils Absolute: 0.1 10*3/uL (ref 0.0–0.5)
Eosinophils Relative: 2 %
HCT: 37.8 % (ref 36.0–46.0)
Hemoglobin: 11.9 g/dL — ABNORMAL LOW (ref 12.0–15.0)
Immature Granulocytes: 1 %
Lymphocytes Relative: 21 %
Lymphs Abs: 0.9 10*3/uL (ref 0.7–4.0)
MCH: 28.3 pg (ref 26.0–34.0)
MCHC: 31.5 g/dL (ref 30.0–36.0)
MCV: 89.8 fL (ref 80.0–100.0)
Monocytes Absolute: 0.7 10*3/uL (ref 0.1–1.0)
Monocytes Relative: 17 %
Neutro Abs: 2.5 10*3/uL (ref 1.7–7.7)
Neutrophils Relative %: 58 %
Platelets: 181 10*3/uL (ref 150–400)
RBC: 4.21 MIL/uL (ref 3.87–5.11)
RDW: 14 % (ref 11.5–15.5)
WBC: 4.2 10*3/uL (ref 4.0–10.5)
nRBC: 0 % (ref 0.0–0.2)

## 2021-05-05 ENCOUNTER — Other Ambulatory Visit
Admission: RE | Admit: 2021-05-05 | Discharge: 2021-05-05 | Disposition: A | Discharge status: Home / Self Care | Payer: 59 | Source: Ambulatory Visit | Attending: Surgery | Admitting: Surgery

## 2021-05-05 ENCOUNTER — Encounter: Payer: Self-pay | Admitting: Urgent Care

## 2021-05-05 ENCOUNTER — Ambulatory Visit (INDEPENDENT_AMBULATORY_CARE_PROVIDER_SITE_OTHER): Payer: 59 | Admitting: Surgery

## 2021-05-05 ENCOUNTER — Other Ambulatory Visit: Payer: Self-pay

## 2021-05-05 ENCOUNTER — Encounter: Payer: Self-pay | Admitting: Surgery

## 2021-05-05 VITALS — BP 156/88 | HR 105 | Temp 98.2°F | Ht 66.0 in | Wt 192.8 lb

## 2021-05-05 DIAGNOSIS — Z170 Estrogen receptor positive status [ER+]: Secondary | ICD-10-CM

## 2021-05-05 DIAGNOSIS — C50412 Malignant neoplasm of upper-outer quadrant of left female breast: Secondary | ICD-10-CM | POA: Diagnosis not present

## 2021-05-05 DIAGNOSIS — Z01812 Encounter for preprocedural laboratory examination: Secondary | ICD-10-CM | POA: Diagnosis present

## 2021-05-05 DIAGNOSIS — Z20822 Contact with and (suspected) exposure to covid-19: Secondary | ICD-10-CM | POA: Diagnosis not present

## 2021-05-05 LAB — POTASSIUM: Potassium: 3.9 mmol/L (ref 3.5–5.1)

## 2021-05-05 NOTE — H&P (View-Only) (Signed)
Patient ID: Michelle Walker, female   DOB: [birth year], 60 y.o.   MRN: [id number]  Chief Complaint: Left breast cancer  History of Present Illness Michelle Walker is a 60 y.o. female with multifocal/multicentric left breast cancer spanning a large area, prior history of radiation, lymph node positivity.  She reports she appears to be tolerating and rebounding from her treatment well. We reviewed her desires for surgery which include total mastectomy without any immediate reconstruction. We rehash the need for removal of level 2 axillary lymph nodes, including the previously biopsied lymph node.  We're expectant that an axillary dissection is in her best interest. Previously: [name] has a history of T1N0 HER-2 negative, ER/PR positive, who underwent her breast conservation therapy in 2016.  I understand she tolerated tamoxifen for about 6 months, tried an aromatase inhibitor but also could not tolerate it.   Recent screening imaging revealed suspicious lesions in the left breast, along with thickened cortex of an axillary node.  Ultrasound biopsies of 2 separate areas of the upper outer left breast were completed along with the axillary lymph node.  All came back positive for invasive mammary carcinoma, ER/PR positive and HER-2 2+ equivocal.    She is deemed she will likely benefit from preoperative chemotherapy, based on additional testing since.   She reports there is no imminent plans for additional chemotherapy and desires to have her port removed at the same time. BREAST MRI, pre-treatment: CLINICAL DATA:  Patient seen for post right lumpectomy diagnostic mammographic surveillance on 09/15/2020. Three areas of architectural distortion were noted in the left breast. Subsequent ultrasound showed 6 discrete hypoechoic masses, the largest at 12 o'clock, 1 cm from the nipple, 2.2 cm, as well as a mildly thickened left axillary lymph node, cortex 4 mm. On 09/19/2020, the  patient underwent ultrasound-guided core needle biopsy of the mass at 12 o'clock, 1 cm from the nipple and the 4 mm mass at 1 o'clock, 5 cm the nipple, and the abnormal lymph node, all biopsies revealing carcinoma. Patient presents for assessment of extent of disease.   LABS:  No labs drawn at time of imaging.   EXAM: BILATERAL BREAST MRI WITH AND WITHOUT CONTRAST   TECHNIQUE: Multiplanar, multisequence MR images of both breasts were obtained prior to and following the intravenous administration of 8 ml of Gadavist   Three-dimensional MR images were rendered by post-processing of the original MR data on an independent workstation. The three-dimensional MR images were interpreted, and findings are reported in the following complete MRI report for this study. Three dimensional images were evaluated at the independent interpreting workstation using the DynaCAD thin client.   COMPARISON:  Previous exam(s).   FINDINGS: Breast composition: b. Scattered fibroglandular tissue.   Background parenchymal enhancement: Minimal   Right breast: No mass or abnormal enhancement. There is post lumpectomy architectural distortion.   Left breast: Multiple enhancing masses with intervening areas of non mass enhancement are noted.   13 mm mass in the anterior breast, upper outer quadrant, associated with biopsy clip susceptibility artifact and architectural distortion.   14 mm mass with architectural distortion in the 12 o'clock retroareolar breast.   9 mm mass in the 1 o'clock retroareolar breast.   7 mm mass in the central to anterior breast between 12 and 1 o'clock.   9 mm mass also in the central to anterior breast near 1 o'clock.   7 mm mass in the upper inner quadrant, middle depth, at approximately 11 o'clock.  Susceptibility artifact lies adjacent to a 3-4 mm enhancing mass in the upper outer left breast, image 64, series 6.   The overall span of the abnormal enhancement,  small enhancing masses and non mass enhancement, extends from the nipple posteriorly and superiorly for 10 cm, measuring 6.5 cm medial to lateral and 5 cm superior to inferior.   No other areas of abnormal left breast enhancement.   Lymph nodes: Single mildly thickened left axillary lymph node, cortex 4-5 mm in thickness, containing susceptibility artifact from the post biopsy marker clip. No other enlarged or abnormal appearing lymph nodes.   Ancillary findings:  None.   IMPRESSION: 1. Multiple enhancing masses and areas of intervening non mass enhancement in the left breast as detailed above, spanning a total of approximately 10 x 5 x 6.5 cm, which includes the 2 areas of recently biopsied carcinoma. Findings are consistent with multifocal/multicentric left breast carcinoma. 2. Single mildly enlarged left axillary lymph node, which reflects the recently biopsied metastatic lymph node. No other enlarged or abnormal lymph nodes. 3. No evidence of right breast malignancy.   RECOMMENDATION: 1. Consider additional biopsy of the 7 mm enhancing mass on image 81, series 6, in the upper inner quadrant of the left breast, to assess extent of disease, if this would alter the current planned management.   BI-RADS CATEGORY  6: Known biopsy-proven malignancy.     Electronically Signed   By: Lajean Manes M.D.   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 Wide excision/ RT, T1a,N0. ER/PR pos, Her 2.neg. Tamoxifen x 6 months, d/c secondary to vasomotor sympotms.    Headache    Hypertension    Personal history of radiation therapy 2016   RIGHT lumpectomy   Thyroid disease       Past Surgical History:  Procedure Laterality Date   ANTERIOR CRUCIATE LIGAMENT REPAIR Right    BREAST BIOPSY Right 07/17/2015   INVASIVE LOBULAR CARCINOMA,  CLASSIC TYPE (1.5 MM), ARISING IN A    BREAST BIOPSY Left 09/19/2020   [place] bx 3 areas/ 1oc q clip/ 12 oc vision clip, axilla lymph node hydromark shape 3/ path pending   BREAST LUMPECTOMY Right 08/22/2015   BREAST LUMPECTOMY WITH SENTINEL LYMPH NODE BIOPSY Right 08/22/2015   Procedure: BREAST LUMPECTOMY WITH SENTINEL LYMPH NODE BX;  Surgeon: Christene Lye, MD;  Location: [facility];  Service: General;  Laterality: Right;   COLONOSCOPY W/ POLYPECTOMY  2014   COLONOSCOPY WITH PROPOFOL N/A 04/16/2019   Procedure: COLONOSCOPY WITH BIOPSIES;  Surgeon: Lucilla Lame, MD;  Location: [facility];  Service: Endoscopy;  Laterality: N/A;   DILATION AND CURETTAGE OF UTERUS     20 years ago   [place]     POLYPECTOMY N/A 04/16/2019   Procedure: POLYPECTOMY;  Surgeon: Lucilla Lame, MD;  Location: [facility];  Service: Endoscopy;  Laterality: N/A;   PORTACATH PLACEMENT Right 12/01/2020   Procedure: INSERTION PORT-A-CATH;  Surgeon: [name], MD;  Location: [facility];  Service: General;  Laterality: Right;    Allergies  Allergen Reactions   Triamcinolone Other (See Comments)    Hypopigmentation s/p steroid injection   Tape Rash    SURGICAL TAPE AFTER BREAST BIOPSY    Current Outpatient Medications  Medication Sig Dispense Refill   allopurinol (ZYLOPRIM) 100 MG tablet Take 100  mg by mouth at bedtime.     amLODipine (NORVASC) 10 MG tablet Take 10 mg by mouth at bedtime.   0   Calcium Carb-Cholecalciferol (CALCIUM 600+D3 PO) Take 1 tablet by mouth at bedtime.     losartan (COZAAR) 100 MG tablet Take 100 mg by mouth at bedtime.   0   metoprolol succinate (TOPROL-XL) 25 MG 24 hr tablet Take 50 mg by mouth at bedtime.  0   potassium chloride SA (KLOR-CON) 20 MEQ tablet TAKE 1 TABLET(20 MEQ) BY MOUTH DAILY 30 tablet 0   PROVENTIL HFA 108 (90 Base) MCG/ACT inhaler Inhale 2 puffs into the lungs every 6 (six) hours as needed for shortness of breath or wheezing.     No  current facility-administered medications for this visit.   Facility-Administered Medications Ordered in Other Visits  Medication Dose Route Frequency Provider Last Rate Last Admin   sodium chloride flush (NS) 0.9 % injection 10 mL  10 mL Intravenous PRN Sindy Guadeloupe, MD   10 mL at 03/27/21 0813    Family History Family History  Problem Relation Age of Onset   Stroke Mother    Cancer Sister        sarcoma on arm; chemo   Diabetes Sister    Stroke Sister    Diabetes Brother    Breast cancer Neg Hx    Ovarian cancer Neg Hx    Colon cancer Neg Hx    Heart disease Neg Hx       Social History Social History   Tobacco Use   Smoking status: Every Day    Packs/day: 0.50    Years: 40.00    Pack years: 20.00    Types: Cigarettes   Smokeless tobacco: Never  Vaping Use   Vaping Use: Never used  Substance Use Topics   Alcohol use: Yes    Alcohol/week: 2.0 standard drinks    Types: 2 Standard drinks or equivalent per week    Comment: BEER 1-2 QD   Drug use: No        Review of Systems  Constitutional: Negative.   HENT: Negative.    Eyes: Negative.   Respiratory: Negative.    Cardiovascular:  Positive for leg swelling (Left leg. Negative for DVT on recent u/s.).  Gastrointestinal: Negative.   Genitourinary: Negative.   Skin: Negative.   Neurological:  Positive for tingling.  Psychiatric/Behavioral: Negative.       Physical Exam Blood pressure (!) 156/88, pulse (!) 105, temperature 98.2 F (36.8 C), temperature source Oral, height _0  (1.676 m), weight 192 lb 12.8 oz (87.5 kg). Last Weight  Most recent update: 05/05/2021  9:08 AM    Weight  87.5 kg (192 lb 12.8 oz)             CONSTITUTIONAL: Well developed, and nourished, appropriately responsive and aware without distress.   EYES: Sclera non-icteric.   EARS, NOSE, MOUTH AND THROAT: Mask worn.    Hearing is intact to voice.  NECK: Trachea is midline, and there is no jugular venous distension.  LYMPH NODES:   Lymph nodes in the neck are not enlarged. RESPIRATORY:  Lungs are clear, and breath sounds are equal bilaterally. Normal respiratory effort without pathologic use of accessory muscles.  Port-A-Cath intact, right subclavian region. CARDIOVASCULAR: Heart is regular in rate and rhythm. GI: The abdomen is soft, nontender, and nondistended. There were no palpable masses. I did not appreciate hepatosplenomegaly. There were normal bowel sounds. MUSCULOSKELETAL:  Symmetrical muscle tone  appreciated in all four extremities.    SKIN: Skin turgor is normal. No pathologic skin lesions appreciated.  NEUROLOGIC:  Motor and sensation appear grossly normal.  Cranial nerves are grossly without defect. PSYCH:  Alert and oriented to person, place and time. Affect is appropriate for situation.  Data Reviewed I have personally reviewed what is currently available of the patient's imaging, recent labs and medical records.   Labs:  CBC Latest Ref Rng & Units 05/01/2021 04/17/2021 04/10/2021  WBC 4.0 - 10.5 K/uL 4.2 4.7 5.1  Hemoglobin 12.0 - 15.0 g/dL 11.9(L) 11.5(L) 11.6(L)  Hematocrit 36.0 - 46.0 % 37.8 35.3(L) 35.1(L)  Platelets 150 - 400 K/uL 181 216 251   CMP Latest Ref Rng & Units 05/01/2021 04/17/2021 04/10/2021  Glucose 70 - 99 mg/dL 105(H) 95 108(H)  BUN 6 - 20 mg/dL _0 Creatinine 0.44 - 1.00 mg/dL 0.68 0.70 0.61  Sodium 135 - 145 mmol/L 138 139 138  Potassium 3.5 - 5.1 mmol/L 3.7 3.4(L) 3.8  Chloride 98 - 111 mmol/L 107 106 106  CO2 22 - 32 mmol/L _1 Calcium 8.9 - 10.3 mg/dL 9.5 9.8 9.6  Total Protein 6.5 - 8.1 g/dL 6.8 6.8 6.8  Total Bilirubin 0.3 - 1.2 mg/dL 0.7 0.3 0.3  Alkaline Phos 38 - 126 U/L 66 60 71  AST 15 - 41 U/L _2 ALT 0 - 44 U/L _3 Imaging:  Within last 24 hrs: No results found.  Assessment    Multifocal/multicentric left breast cancer, axillary lymph node positive on core biopsy.  Status post preoperative chemotherapy. Patient Active Problem List    Diagnosis Date Noted   Prophylaxis for chemotherapy-induced neutropenia 01/16/2021   Genetic testing 11/26/2020   Goals of care, counseling/discussion 11/04/2020   Breast cancer, left ([place]) 11/04/2020   History of colonic polyps    Polyp of ascending colon    Depression 09/20/2017   DM2 (diabetes mellitus, type 2) ([place]) 09/20/2017   Gout 09/20/2017   HTN (hypertension) 09/20/2017   Migraine 09/20/2017   History of endometrial ablation 02/26/2016   Malignant neoplasm of upper-outer quadrant of left breast in female, estrogen receptor positive ([place]) 08/04/2015    Plan    Left modified radical mastectomy, Port-A-Cath removal. We discussed the risks of anesthesia, bleeding, infection, neuropathy, lymphedema, postoperative seroma and potential recurrence.  She is well aware that we will increase her risk of lymph edema with her axillary dissection, but do everything in our power to minimize any postoperative sequelae.  Face-to-face time spent with the patient and accompanying care providers(if present) was 30 minutes, with more than 50% of the time spent counseling, educating, and coordinating care of the patient.    These notes generated with voice recognition software. I apologize for typographical errors.  [name] M.D., FACS 05/05/2021, 9:21 AM

## 2021-05-05 NOTE — Progress Notes (Signed)
Patient ID: Michelle Walker, female   DOB: [birth year], 60 y.o.   MRN: [id number]  Chief Complaint: Left breast cancer  History of Present Illness Michelle Walker is a 60 y.o. female with multifocal/multicentric left breast cancer spanning a large area, prior history of radiation, lymph node positivity.  She reports she appears to be tolerating and rebounding from her treatment well. We reviewed her desires for surgery which include total mastectomy without any immediate reconstruction. We rehash the need for removal of level 2 axillary lymph nodes, including the previously biopsied lymph node.  We're expectant that an axillary dissection is in her best interest. Previously: Michelle Walker has a history of T1N0 HER-2 negative, ER/PR positive, who underwent her breast conservation therapy in 2016.  I understand she tolerated tamoxifen for about 6 months, tried an aromatase inhibitor but also could not tolerate it.   Recent screening imaging revealed suspicious lesions in the left breast, along with thickened cortex of an axillary node.  Ultrasound biopsies of 2 separate areas of the upper outer left breast were completed along with the axillary lymph node.  All came back positive for invasive mammary carcinoma, ER/PR positive and HER-2 2+ equivocal.    She is deemed she will likely benefit from preoperative chemotherapy, based on additional testing since.   She reports there is no imminent plans for additional chemotherapy and desires to have her port removed at the same time. BREAST MRI, pre-treatment: CLINICAL DATA:  Patient seen for post right lumpectomy diagnostic mammographic surveillance on 09/15/2020. Three areas of architectural distortion were noted in the left breast. Subsequent ultrasound showed 6 discrete hypoechoic masses, the largest at 12 o'clock, 1 cm from the nipple, 2.2 cm, as well as a mildly thickened left axillary lymph node, cortex 4 mm. On 09/19/2020, the  patient underwent ultrasound-guided core needle biopsy of the mass at 12 o'clock, 1 cm from the nipple and the 4 mm mass at 1 o'clock, 5 cm the nipple, and the abnormal lymph node, all biopsies revealing carcinoma. Patient presents for assessment of extent of disease.   LABS:  No labs drawn at time of imaging.   EXAM: BILATERAL BREAST MRI WITH AND WITHOUT CONTRAST   TECHNIQUE: Multiplanar, multisequence MR images of both breasts were obtained prior to and following the intravenous administration of 8 ml of Gadavist   Three-dimensional MR images were rendered by post-processing of the original MR data on an independent workstation. The three-dimensional MR images were interpreted, and findings are reported in the following complete MRI report for this study. Three dimensional images were evaluated at the independent interpreting workstation using the DynaCAD thin client.   COMPARISON:  Previous exam(s).   FINDINGS: Breast composition: b. Scattered fibroglandular tissue.   Background parenchymal enhancement: Minimal   Right breast: No mass or abnormal enhancement. There is post lumpectomy architectural distortion.   Left breast: Multiple enhancing masses with intervening areas of non mass enhancement are noted.   13 mm mass in the anterior breast, upper outer quadrant, associated with biopsy clip susceptibility artifact and architectural distortion.   14 mm mass with architectural distortion in the 12 o'clock retroareolar breast.   9 mm mass in the 1 o'clock retroareolar breast.   7 mm mass in the central to anterior breast between 12 and 1 o'clock.   9 mm mass also in the central to anterior breast near 1 o'clock.   7 mm mass in the upper inner quadrant, middle depth, at approximately 11 o'clock.  Susceptibility artifact lies adjacent to a 3-4 mm enhancing mass in the upper outer left breast, image 64, series 6.   The overall span of the abnormal enhancement,  small enhancing masses and non mass enhancement, extends from the nipple posteriorly and superiorly for 10 cm, measuring 6.5 cm medial to lateral and 5 cm superior to inferior.   No other areas of abnormal left breast enhancement.   Lymph nodes: Single mildly thickened left axillary lymph node, cortex 4-5 mm in thickness, containing susceptibility artifact from the post biopsy marker clip. No other enlarged or abnormal appearing lymph nodes.   Ancillary findings:  None.   IMPRESSION: 1. Multiple enhancing masses and areas of intervening non mass enhancement in the left breast as detailed above, spanning a total of approximately 10 x 5 x 6.5 cm, which includes the 2 areas of recently biopsied carcinoma. Findings are consistent with multifocal/multicentric left breast carcinoma. 2. Single mildly enlarged left axillary lymph node, which reflects the recently biopsied metastatic lymph node. No other enlarged or abnormal lymph nodes. 3. No evidence of right breast malignancy.   RECOMMENDATION: 1. Consider additional biopsy of the 7 mm enhancing mass on image 81, series 6, in the upper inner quadrant of the left breast, to assess extent of disease, if this would alter the current planned management.   BI-RADS CATEGORY  6: Known biopsy-proven malignancy.     Electronically Signed   By: Lajean Manes M.D.   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 Wide excision/ RT, T1a,N0. ER/PR pos, Her 2.neg. Tamoxifen x 6 months, d/c secondary to vasomotor sympotms.    Headache    Hypertension    Personal history of radiation therapy 2016   RIGHT lumpectomy   Thyroid disease       Past Surgical History:  Procedure Laterality Date   ANTERIOR CRUCIATE LIGAMENT REPAIR Right    BREAST BIOPSY Right 07/17/2015   INVASIVE LOBULAR CARCINOMA,  CLASSIC TYPE (1.5 MM), ARISING IN A    BREAST BIOPSY Left 09/19/2020   [place] bx 3 areas/ 1oc q clip/ 12 oc vision clip, axilla lymph node hydromark shape 3/ path pending   BREAST LUMPECTOMY Right 08/22/2015   BREAST LUMPECTOMY WITH SENTINEL LYMPH NODE BIOPSY Right 08/22/2015   Procedure: BREAST LUMPECTOMY WITH SENTINEL LYMPH NODE BX;  Surgeon: Christene Lye, MD;  Location: [facility];  Service: General;  Laterality: Right;   COLONOSCOPY W/ POLYPECTOMY  2014   COLONOSCOPY WITH PROPOFOL N/A 04/16/2019   Procedure: COLONOSCOPY WITH BIOPSIES;  Surgeon: Lucilla Lame, MD;  Location: [facility];  Service: Endoscopy;  Laterality: N/A;   DILATION AND CURETTAGE OF UTERUS     20 years ago   [place]     POLYPECTOMY N/A 04/16/2019   Procedure: POLYPECTOMY;  Surgeon: Lucilla Lame, MD;  Location: [facility];  Service: Endoscopy;  Laterality: N/A;   PORTACATH PLACEMENT Right 12/01/2020   Procedure: INSERTION PORT-A-CATH;  Surgeon: [name], MD;  Location: [facility];  Service: General;  Laterality: Right;    Allergies  Allergen Reactions   Triamcinolone Other (See Comments)    Hypopigmentation s/p steroid injection   Tape Rash    SURGICAL TAPE AFTER BREAST BIOPSY    Current Outpatient Medications  Medication Sig Dispense Refill   allopurinol (ZYLOPRIM) 100 MG tablet Take 100  mg by mouth at bedtime.     amLODipine (NORVASC) 10 MG tablet Take 10 mg by mouth at bedtime.   0   Calcium Carb-Cholecalciferol (CALCIUM 600+D3 PO) Take 1 tablet by mouth at bedtime.     losartan (COZAAR) 100 MG tablet Take 100 mg by mouth at bedtime.   0   metoprolol succinate (TOPROL-XL) 25 MG 24 hr tablet Take 50 mg by mouth at bedtime.  0   potassium chloride SA (KLOR-CON) 20 MEQ tablet TAKE 1 TABLET(20 MEQ) BY MOUTH DAILY 30 tablet 0   PROVENTIL HFA 108 (90 Base) MCG/ACT inhaler Inhale 2 puffs into the lungs every 6 (six) hours as needed for shortness of breath or wheezing.     No  current facility-administered medications for this visit.   Facility-Administered Medications Ordered in Other Visits  Medication Dose Route Frequency Provider Last Rate Last Admin   sodium chloride flush (NS) 0.9 % injection 10 mL  10 mL Intravenous PRN Sindy Guadeloupe, MD   10 mL at 03/27/21 0813    Family History Family History  Problem Relation Age of Onset   Stroke Mother    Cancer Sister        sarcoma on arm; chemo   Diabetes Sister    Stroke Sister    Diabetes Brother    Breast cancer Neg Hx    Ovarian cancer Neg Hx    Colon cancer Neg Hx    Heart disease Neg Hx       Social History Social History   Tobacco Use   Smoking status: Every Day    Packs/day: 0.50    Years: 40.00    Pack years: 20.00    Types: Cigarettes   Smokeless tobacco: Never  Vaping Use   Vaping Use: Never used  Substance Use Topics   Alcohol use: Yes    Alcohol/week: 2.0 standard drinks    Types: 2 Standard drinks or equivalent per week    Comment: BEER 1-2 QD   Drug use: No        Review of Systems  Constitutional: Negative.   HENT: Negative.    Eyes: Negative.   Respiratory: Negative.    Cardiovascular:  Positive for leg swelling (Left leg. Negative for DVT on recent u/s.).  Gastrointestinal: Negative.   Genitourinary: Negative.   Skin: Negative.   Neurological:  Positive for tingling.  Psychiatric/Behavioral: Negative.       Physical Exam Blood pressure (!) 156/88, pulse (!) 105, temperature 98.2 F (36.8 C), temperature source Oral, height _0  (1.676 m), weight 192 lb 12.8 oz (87.5 kg). Last Weight  Most recent update: 05/05/2021  9:08 AM    Weight  87.5 kg (192 lb 12.8 oz)             CONSTITUTIONAL: Well developed, and nourished, appropriately responsive and aware without distress.   EYES: Sclera non-icteric.   EARS, NOSE, MOUTH AND THROAT: Mask worn.    Hearing is intact to voice.  NECK: Trachea is midline, and there is no jugular venous distension.  LYMPH NODES:   Lymph nodes in the neck are not enlarged. RESPIRATORY:  Lungs are clear, and breath sounds are equal bilaterally. Normal respiratory effort without pathologic use of accessory muscles.  Port-A-Cath intact, right subclavian region. CARDIOVASCULAR: Heart is regular in rate and rhythm. GI: The abdomen is soft, nontender, and nondistended. There were no palpable masses. I did not appreciate hepatosplenomegaly. There were normal bowel sounds. MUSCULOSKELETAL:  Symmetrical muscle tone  appreciated in all four extremities.    SKIN: Skin turgor is normal. No pathologic skin lesions appreciated.  NEUROLOGIC:  Motor and sensation appear grossly normal.  Cranial nerves are grossly without defect. PSYCH:  Alert and oriented to person, place and time. Affect is appropriate for situation.  Data Reviewed I have personally reviewed what is currently available of the patient's imaging, recent labs and medical records.   Labs:  CBC Latest Ref Rng & Units 05/01/2021 04/17/2021 04/10/2021  WBC 4.0 - 10.5 K/uL 4.2 4.7 5.1  Hemoglobin 12.0 - 15.0 g/dL 11.9(L) 11.5(L) 11.6(L)  Hematocrit 36.0 - 46.0 % 37.8 35.3(L) 35.1(L)  Platelets 150 - 400 K/uL 181 216 251   CMP Latest Ref Rng & Units 05/01/2021 04/17/2021 04/10/2021  Glucose 70 - 99 mg/dL 105(H) 95 108(H)  BUN 6 - 20 mg/dL _0 Creatinine 0.44 - 1.00 mg/dL 0.68 0.70 0.61  Sodium 135 - 145 mmol/L 138 139 138  Potassium 3.5 - 5.1 mmol/L 3.7 3.4(L) 3.8  Chloride 98 - 111 mmol/L 107 106 106  CO2 22 - 32 mmol/L _1 Calcium 8.9 - 10.3 mg/dL 9.5 9.8 9.6  Total Protein 6.5 - 8.1 g/dL 6.8 6.8 6.8  Total Bilirubin 0.3 - 1.2 mg/dL 0.7 0.3 0.3  Alkaline Phos 38 - 126 U/L 66 60 71  AST 15 - 41 U/L _2 ALT 0 - 44 U/L _3 Imaging:  Within last 24 hrs: No results found.  Assessment    Multifocal/multicentric left breast cancer, axillary lymph node positive on core biopsy.  Status post preoperative chemotherapy. Patient Active Problem List    Diagnosis Date Noted   Prophylaxis for chemotherapy-induced neutropenia 01/16/2021   Genetic testing 11/26/2020   Goals of care, counseling/discussion 11/04/2020   Breast cancer, left ([place]) 11/04/2020   History of colonic polyps    Polyp of ascending colon    Depression 09/20/2017   DM2 (diabetes mellitus, type 2) ([place]) 09/20/2017   Gout 09/20/2017   HTN (hypertension) 09/20/2017   Migraine 09/20/2017   History of endometrial ablation 02/26/2016   Malignant neoplasm of upper-outer quadrant of left breast in female, estrogen receptor positive ([place]) 08/04/2015    Plan    Left modified radical mastectomy, Port-A-Cath removal. We discussed the risks of anesthesia, bleeding, infection, neuropathy, lymphedema, postoperative seroma and potential recurrence.  She is well aware that we will increase her risk of lymph edema with her axillary dissection, but do everything in our power to minimize any postoperative sequelae.  Face-to-face time spent with the patient and accompanying care providers(if present) was 30 minutes, with more than 50% of the time spent counseling, educating, and coordinating care of the patient.    These notes generated with voice recognition software. I apologize for typographical errors.  [name] M.D., FACS 05/05/2021, 9:21 AM

## 2021-05-05 NOTE — Patient Instructions (Signed)
If you have any concerns or questions, please feel free to call our office.    Total or Modified Radical Mastectomy A total mastectomy and a modified radical mastectomy are surgeries that are done as part of treatment for breast cancer. You will have one of those types of surgery. Both types involve removing a breast. In a total mastectomy (simple mastectomy), all breast tissue including the nipple will be removed. In a modified radical mastectomy, lymph nodes under the arm will be removed along with the breast and nipple. Some of the lining over the muscle tissues under the breast may also be removed. These procedures may also be used to help prevent breast cancer. A preventive (prophylactic) mastectomy may be done if you are at an increased risk of breast cancer due to harmful changes (mutations) in certain genes (BRCA genes). In that case, the procedure involves removing both of your breasts. This canreduce your risk of developing breast cancer in the future. For a transgender person, a total mastectomy may be done as part of a surgicaltransition from female to female. Let your health care provider know about: Any allergies you have. All medicines you are taking, including vitamins, herbs, eye drops, creams, and over-the-counter medicines. Any problems you or family members have had with anesthetic medicines. Any blood disorders you have. Any surgeries you have had. Any medical conditions you have. Whether you are pregnant or may be pregnant. What are the risks? Generally, this is a safe procedure. However, problems may occur, including: Pain. Infection. Bleeding. Allergic reactions to medicines. Scar tissue. Chest numbness on the side of the surgery. Fluid buildup under the skin flaps where your breast was removed (seroma). Sensation of throbbing or tingling. Stress or sadness from losing your breast. If you have the lymph nodes under your arm removed, you may have arm  swelling,weakness, or numbness on the same side of your body as your surgery. What happens before the procedure? Staying hydrated Follow instructions from your health care provider about hydration, which may include: Up to 2 hours before the procedure - you may continue to drink clear liquids, such as water, clear fruit juice, black coffee, and plain tea.  Eating and drinking restrictions Follow instructions from your health care provider about eating and drinking, which may include: 8 hours before the procedure - stop eating heavy meals or foods such as meat, fried foods, or fatty foods. 6 hours before the procedure - stop eating light meals or foods, such as toast or cereal. 6 hours before the procedure - stop drinking milk or drinks that contain milk. 2 hours before the procedure - stop drinking clear liquids. Medicines Ask your health care provider about: Changing or stopping your regular medicines. This is especially important if you are taking diabetes medicines or blood thinners. Taking medicines such as aspirin and ibuprofen. These medicines can thin your blood. Do not take these medicines unless your health care provider tells you to take them. Taking over-the-counter medicines, vitamins, herbs, and supplements. Your health care team may give you antibiotic medicine to help prevent infection. General instructions You may be checked for extra fluid around your lymph nodes (lymphedema). Plan to have someone take you home from the hospital or clinic. Plan to have a responsible adult care for you for at least 24 hours after you leave the hospital or clinic. This is important. Ask your health care provider how your surgical site will be marked or identified. You may be asked to shower with a germ-killing  soap. What happens during the procedure? To lower your risk of infection: Your health care team will wash or sanitize their hands. Your skin will be washed with soap. An IV will be  inserted into one of your veins. You will be given a medicine to make you fall asleep (general anesthetic). A wide incision will be made around your nipple. The skin and nipple inside the incision will be removed along with all breast tissue. If you are having a modified radical mastectomy: The lining over your chest muscles will be removed. The incision may be extended to reach the lymph nodes under your arm, or a second incision may be made. Lymph nodes will be removed. Breast tissue and lymph nodes that are removed will be sent to the lab for testing. You may have a drainage tube inserted into your incision to collect fluid that builds up after surgery. This tube will be connected to a suction bulb on the outside of your body to remove the fluid. Your incision or incisions will be closed with stitches (sutures). A bandage (dressing) will be placed over your breast area. If lymph nodes were removed, a dressing will also be placed under your arm. The procedure may vary among health care providers and hospitals. What happens after the procedure? Your blood pressure, heart rate, breathing rate, and blood oxygen level will be monitored until the medicines you were given have worn off. You will be given pain medicine as needed. You will be encouraged to get up and walk as soon as you can. Your IV can be removed when you are able to eat and drink. You may have a drainage tube in place for 2-3 days to prevent a collection of blood (hematoma) from developing in the breast area. You will be given instructions about caring for the drain before you go home. A pressure bandage may be applied for 1-2 days to prevent bleeding or swelling. Ask your health care provider how to care for your pressure bandage at home. Summary In a total mastectomy (simple mastectomy), all breast tissue including the nipple will be removed. In a modified radical mastectomy, the lymph nodes under the arm will be removed along with  the breast and nipple. Before the procedure, follow instructions from your health care provider about eating and drinking, and ask about changing or stopping your regular medicines. You will be given a medicine to make you fall asleep (general anesthetic) during the procedure. This information is not intended to replace advice given to you by your health care provider. Make sure you discuss any questions you have with your healthcare provider. Document Revised: 05/09/2020 Document Reviewed: 05/09/2020 Elsevier Patient Education  [place].

## 2021-05-06 ENCOUNTER — Other Ambulatory Visit: Payer: Self-pay

## 2021-05-06 ENCOUNTER — Ambulatory Visit: Payer: 59 | Admitting: Registered Nurse

## 2021-05-06 ENCOUNTER — Encounter: Payer: Self-pay | Admitting: Surgery

## 2021-05-06 ENCOUNTER — Encounter
Admission: RE | Disposition: A | Discharge status: Home / Self Care | Payer: Self-pay | Source: Home / Self Care | Attending: Surgery

## 2021-05-06 ENCOUNTER — Ambulatory Visit: Payer: 59

## 2021-05-06 ENCOUNTER — Observation Stay
Admission: RE | Admit: 2021-05-06 | Discharge: 2021-05-07 | Disposition: A | Discharge status: Home / Self Care | Payer: 59 | Attending: Surgery | Admitting: Surgery

## 2021-05-06 ENCOUNTER — Ambulatory Visit
Admission: RE | Admit: 2021-05-06 | Discharge: 2021-05-06 | Disposition: A | Discharge status: Home / Self Care | Payer: 59 | Source: Home / Self Care | Attending: Surgery | Admitting: Surgery

## 2021-05-06 DIAGNOSIS — Z170 Estrogen receptor positive status [ER+]: Secondary | ICD-10-CM | POA: Diagnosis not present

## 2021-05-06 DIAGNOSIS — I10 Essential (primary) hypertension: Secondary | ICD-10-CM | POA: Insufficient documentation

## 2021-05-06 DIAGNOSIS — C773 Secondary and unspecified malignant neoplasm of axilla and upper limb lymph nodes: Secondary | ICD-10-CM | POA: Diagnosis not present

## 2021-05-06 DIAGNOSIS — C50911 Malignant neoplasm of unspecified site of right female breast: Secondary | ICD-10-CM | POA: Diagnosis present

## 2021-05-06 DIAGNOSIS — C50912 Malignant neoplasm of unspecified site of left female breast: Principal | ICD-10-CM | POA: Insufficient documentation

## 2021-05-06 DIAGNOSIS — Z419 Encounter for procedure for purposes other than remedying health state, unspecified: Secondary | ICD-10-CM

## 2021-05-06 DIAGNOSIS — Z853 Personal history of malignant neoplasm of breast: Secondary | ICD-10-CM | POA: Insufficient documentation

## 2021-05-06 DIAGNOSIS — F17210 Nicotine dependence, cigarettes, uncomplicated: Secondary | ICD-10-CM | POA: Diagnosis not present

## 2021-05-06 DIAGNOSIS — C50412 Malignant neoplasm of upper-outer quadrant of left female breast: Secondary | ICD-10-CM | POA: Diagnosis not present

## 2021-05-06 DIAGNOSIS — Z79899 Other long term (current) drug therapy: Secondary | ICD-10-CM | POA: Diagnosis not present

## 2021-05-06 DIAGNOSIS — E119 Type 2 diabetes mellitus without complications: Secondary | ICD-10-CM | POA: Insufficient documentation

## 2021-05-06 HISTORY — PX: TOTAL MASTECTOMY: SHX6129

## 2021-05-06 LAB — SARS CORONAVIRUS 2 (TAT 6-24 HRS): SARS Coronavirus 2: NEGATIVE

## 2021-05-06 SURGERY — MASTECTOMY, SIMPLE
Anesthesia: General | Laterality: Left

## 2021-05-06 MED ORDER — GABAPENTIN 300 MG PO CAPS
ORAL_CAPSULE | ORAL | Status: AC
  Administered 2021-05-06: 300 mg via ORAL
  Filled 2021-05-06: qty 1

## 2021-05-06 MED ORDER — PROPOFOL 500 MG/50ML IV EMUL
INTRAVENOUS | Status: DC | PRN
  Administered 2021-05-06: 20 ug/kg/min via INTRAVENOUS

## 2021-05-06 MED ORDER — KETOROLAC TROMETHAMINE 30 MG/ML IJ SOLN: 30.0000 mg | Freq: Four times a day (QID) | INTRAMUSCULAR | Status: DC | PRN

## 2021-05-06 MED ORDER — CHLORHEXIDINE GLUCONATE 0.12 % MT SOLN
OROMUCOSAL | Status: AC
  Administered 2021-05-06: 15 mL via OROMUCOSAL
  Filled 2021-05-06: qty 15

## 2021-05-06 MED ORDER — OXYCODONE HCL 5 MG PO TABS: 5.0000 mg | ORAL_TABLET | Freq: Once | ORAL | Status: DC | PRN

## 2021-05-06 MED ORDER — FAMOTIDINE 20 MG PO TABS: 20.0000 mg | ORAL_TABLET | Freq: Once | ORAL | Status: AC

## 2021-05-06 MED ORDER — HYDROMORPHONE HCL 1 MG/ML IJ SOLN
INTRAMUSCULAR | Status: DC | PRN
  Administered 2021-05-06: .25 mg via INTRAVENOUS
  Administered 2021-05-06: .5 mg via INTRAVENOUS

## 2021-05-06 MED ORDER — HEMOSTATIC AGENTS (NO CHARGE) OPTIME
TOPICAL | Status: DC | PRN
  Administered 2021-05-06: 1 via TOPICAL

## 2021-05-06 MED ORDER — FAMOTIDINE 20 MG PO TABS
ORAL_TABLET | ORAL | Status: AC
  Administered 2021-05-06: 20 mg via ORAL
  Filled 2021-05-06: qty 1

## 2021-05-06 MED ORDER — METOPROLOL SUCCINATE ER 50 MG PO TB24
50.0000 mg | ORAL_TABLET | Freq: Every day | ORAL | Status: DC
  Administered 2021-05-06: 50 mg via ORAL
  Filled 2021-05-06: qty 1

## 2021-05-06 MED ORDER — LOSARTAN POTASSIUM 50 MG PO TABS
100.0000 mg | ORAL_TABLET | Freq: Every day | ORAL | Status: DC
  Administered 2021-05-06: 100 mg via ORAL
  Filled 2021-05-06: qty 2

## 2021-05-06 MED ORDER — PROMETHAZINE HCL 25 MG/ML IJ SOLN
6.2500 mg | INTRAMUSCULAR | Status: DC | PRN
  Administered 2021-05-06: 6.25 mg via INTRAVENOUS

## 2021-05-06 MED ORDER — CHLORHEXIDINE GLUCONATE 0.12 % MT SOLN: 15.0000 mL | Freq: Once | OROMUCOSAL | Status: AC

## 2021-05-06 MED ORDER — METOPROLOL TARTRATE 5 MG/5ML IV SOLN: 5.0000 mg | Freq: Four times a day (QID) | INTRAVENOUS | Status: DC | PRN

## 2021-05-06 MED ORDER — KETAMINE HCL 50 MG/ML IJ SOLN
INTRAMUSCULAR | Status: DC | PRN
  Administered 2021-05-06: 25 mg via INTRAMUSCULAR

## 2021-05-06 MED ORDER — PROPOFOL 10 MG/ML IV BOLUS
INTRAVENOUS | Status: DC | PRN
  Administered 2021-05-06: 150 mg via INTRAVENOUS
  Administered 2021-05-06 (×2): 50 mg via INTRAVENOUS

## 2021-05-06 MED ORDER — HYDROMORPHONE HCL 1 MG/ML IJ SOLN
INTRAMUSCULAR | Status: AC
  Filled 2021-05-06: qty 1

## 2021-05-06 MED ORDER — CELECOXIB 200 MG PO CAPS
ORAL_CAPSULE | ORAL | Status: AC
  Administered 2021-05-06: 200 mg via ORAL
  Filled 2021-05-06: qty 2

## 2021-05-06 MED ORDER — ONDANSETRON HCL 4 MG/2ML IJ SOLN: 4.0000 mg | Freq: Four times a day (QID) | INTRAMUSCULAR | Status: DC | PRN

## 2021-05-06 MED ORDER — CEFAZOLIN SODIUM-DEXTROSE 2-4 GM/100ML-% IV SOLN
2.0000 g | Freq: Three times a day (TID) | INTRAVENOUS | Status: AC
  Administered 2021-05-06: 2 g via INTRAVENOUS
  Filled 2021-05-06: qty 100

## 2021-05-06 MED ORDER — PROMETHAZINE HCL 25 MG/ML IJ SOLN
INTRAMUSCULAR | Status: AC
  Filled 2021-05-06: qty 1

## 2021-05-06 MED ORDER — CELECOXIB 200 MG PO CAPS
200.0000 mg | ORAL_CAPSULE | ORAL | Status: AC
Start: 2021-05-06 — End: 2021-05-06

## 2021-05-06 MED ORDER — STERILE WATER FOR IRRIGATION IR SOLN
Status: DC | PRN
  Administered 2021-05-06: 1000 mL

## 2021-05-06 MED ORDER — PHENYLEPHRINE HCL (PRESSORS) 10 MG/ML IV SOLN
INTRAVENOUS | Status: DC | PRN
  Administered 2021-05-06: 50 ug via INTRAVENOUS
  Administered 2021-05-06 (×4): 100 ug via INTRAVENOUS

## 2021-05-06 MED ORDER — CEFAZOLIN SODIUM-DEXTROSE 2-4 GM/100ML-% IV SOLN
2.0000 g | INTRAVENOUS | Status: AC
  Administered 2021-05-06: 2 g via INTRAVENOUS

## 2021-05-06 MED ORDER — GABAPENTIN 300 MG PO CAPS
300.0000 mg | ORAL_CAPSULE | ORAL | Status: AC
Start: 2021-05-06 — End: 2021-05-06

## 2021-05-06 MED ORDER — ALLOPURINOL 100 MG PO TABS
100.0000 mg | ORAL_TABLET | Freq: Every day | ORAL | Status: DC
  Administered 2021-05-06: 100 mg via ORAL
  Filled 2021-05-06: qty 1

## 2021-05-06 MED ORDER — PROPOFOL 10 MG/ML IV BOLUS
INTRAVENOUS | Status: AC
  Filled 2021-05-06: qty 20

## 2021-05-06 MED ORDER — KETAMINE HCL 50 MG/5ML IJ SOSY
PREFILLED_SYRINGE | INTRAMUSCULAR | Status: AC
  Filled 2021-05-06: qty 5

## 2021-05-06 MED ORDER — OXYCODONE HCL 5 MG/5ML PO SOLN
5.0000 mg | Freq: Once | ORAL | Status: DC | PRN
Start: 2021-05-06 — End: 2021-05-06

## 2021-05-06 MED ORDER — CHLORHEXIDINE GLUCONATE CLOTH 2 % EX PADS
6.0000 | MEDICATED_PAD | Freq: Once | CUTANEOUS | Status: DC
Start: 2021-05-06 — End: 2021-05-06

## 2021-05-06 MED ORDER — BUPIVACAINE LIPOSOME 1.3 % IJ SUSP
20.0000 mL | Freq: Once | INTRAMUSCULAR | Status: DC
Start: 2021-05-06 — End: 2021-05-06

## 2021-05-06 MED ORDER — ONDANSETRON 4 MG PO TBDP: 4.0000 mg | ORAL_TABLET | Freq: Four times a day (QID) | ORAL | Status: DC | PRN

## 2021-05-06 MED ORDER — CEFAZOLIN SODIUM-DEXTROSE 2-4 GM/100ML-% IV SOLN
INTRAVENOUS | Status: AC
  Filled 2021-05-06: qty 100

## 2021-05-06 MED ORDER — FENTANYL CITRATE (PF) 100 MCG/2ML IJ SOLN
INTRAMUSCULAR | Status: AC
  Filled 2021-05-06: qty 2

## 2021-05-06 MED ORDER — ONDANSETRON HCL 4 MG/2ML IJ SOLN
INTRAMUSCULAR | Status: DC | PRN
  Administered 2021-05-06: 4 mg via INTRAVENOUS

## 2021-05-06 MED ORDER — HYDROCODONE-ACETAMINOPHEN 5-325 MG PO TABS: 1.0000 | ORAL_TABLET | ORAL | Status: DC | PRN

## 2021-05-06 MED ORDER — MIDAZOLAM HCL 2 MG/2ML IJ SOLN
INTRAMUSCULAR | Status: DC | PRN
  Administered 2021-05-06: 2 mg via INTRAVENOUS

## 2021-05-06 MED ORDER — MIDAZOLAM HCL 2 MG/2ML IJ SOLN
INTRAMUSCULAR | Status: AC
  Filled 2021-05-06: qty 2

## 2021-05-06 MED ORDER — MORPHINE SULFATE (PF) 2 MG/ML IV SOLN
2.0000 mg | INTRAVENOUS | Status: DC | PRN
  Administered 2021-05-06 – 2021-05-07 (×2): 2 mg via INTRAVENOUS
  Filled 2021-05-06 (×2): qty 1

## 2021-05-06 MED ORDER — LACTATED RINGERS IV SOLN: INTRAVENOUS | Status: DC

## 2021-05-06 MED ORDER — GLYCOPYRROLATE 0.2 MG/ML IJ SOLN
INTRAMUSCULAR | Status: DC | PRN
  Administered 2021-05-06: .1 mg via INTRAVENOUS

## 2021-05-06 MED ORDER — ACETAMINOPHEN 500 MG PO TABS
ORAL_TABLET | ORAL | Status: AC
  Administered 2021-05-06: 1000 mg via ORAL
  Filled 2021-05-06: qty 2

## 2021-05-06 MED ORDER — DIPHENHYDRAMINE HCL 50 MG/ML IJ SOLN: 25.0000 mg | Freq: Four times a day (QID) | INTRAMUSCULAR | Status: DC | PRN

## 2021-05-06 MED ORDER — FENTANYL CITRATE (PF) 100 MCG/2ML IJ SOLN
25.0000 ug | INTRAMUSCULAR | Status: DC | PRN
  Administered 2021-05-06: 50 ug via INTRAVENOUS
  Administered 2021-05-06 (×2): 25 ug via INTRAVENOUS

## 2021-05-06 MED ORDER — ACETAMINOPHEN 500 MG PO TABS: 1000.0000 mg | ORAL_TABLET | ORAL | Status: AC

## 2021-05-06 MED ORDER — FENTANYL CITRATE (PF) 100 MCG/2ML IJ SOLN
INTRAMUSCULAR | Status: DC | PRN
  Administered 2021-05-06 (×4): 25 ug via INTRAVENOUS

## 2021-05-06 MED ORDER — MEPERIDINE HCL 25 MG/ML IJ SOLN: 6.2500 mg | INTRAMUSCULAR | Status: DC | PRN

## 2021-05-06 MED ORDER — SODIUM CHLORIDE 0.9 % IV SOLN: INTRAVENOUS | Status: DC

## 2021-05-06 MED ORDER — AMLODIPINE BESYLATE 10 MG PO TABS
10.0000 mg | ORAL_TABLET | Freq: Every day | ORAL | Status: DC
  Administered 2021-05-06: 10 mg via ORAL
  Filled 2021-05-06: qty 1

## 2021-05-06 MED ORDER — SODIUM CHLORIDE FLUSH 0.9 % IV SOLN
INTRAVENOUS | Status: AC
  Filled 2021-05-06: qty 10

## 2021-05-06 MED ORDER — BUPIVACAINE HCL (PF) 0.25 % IJ SOLN
INTRAMUSCULAR | Status: AC
  Filled 2021-05-06: qty 30

## 2021-05-06 MED ORDER — ALBUTEROL SULFATE (2.5 MG/3ML) 0.083% IN NEBU: 2.5000 mg | INHALATION_SOLUTION | Freq: Four times a day (QID) | RESPIRATORY_TRACT | Status: DC | PRN

## 2021-05-06 MED ORDER — DIPHENHYDRAMINE HCL 25 MG PO CAPS: 25.0000 mg | ORAL_CAPSULE | Freq: Four times a day (QID) | ORAL | Status: DC | PRN

## 2021-05-06 MED ORDER — ORAL CARE MOUTH RINSE: 15.0000 mL | Freq: Once | OROMUCOSAL | Status: AC

## 2021-05-06 SURGICAL SUPPLY — 46 items
BLADE SURG SZ10 CARB STEEL (BLADE) ×4 IMPLANT
BRA SURGICAL XLRG (MISCELLANEOUS) IMPLANT
BULB RESERV EVAC DRAIN JP 100C (MISCELLANEOUS) ×4 IMPLANT
CHLORAPREP W/TINT 26 (MISCELLANEOUS) ×2 IMPLANT
CLIP VESOCCLUDE MED 6/CT (CLIP) IMPLANT
DEVICE DUBIN SPECIMEN MAMMOGRA (MISCELLANEOUS) ×2 IMPLANT
DRAIN CHANNEL JP 15F RND 16 (MISCELLANEOUS) ×4 IMPLANT
DRAPE LAPAROTOMY TRNSV 106X77 (MISCELLANEOUS) ×2 IMPLANT
DRSG GAUZE FLUFF 36X18 (GAUZE/BANDAGES/DRESSINGS) ×2 IMPLANT
DRSG TELFA 3X8 NADH (GAUZE/BANDAGES/DRESSINGS) IMPLANT
ELECT CAUTERY BLADE 6.4 (BLADE) IMPLANT
ELECT CAUTERY BLADE TIP 2.5 (TIP)
ELECT REM PT RETURN 9FT ADLT (ELECTROSURGICAL) ×2
ELECTRODE CAUTERY BLDE TIP 2.5 (TIP) IMPLANT
ELECTRODE REM PT RTRN 9FT ADLT (ELECTROSURGICAL) ×1 IMPLANT
GAUZE 4X4 16PLY ~~LOC~~+RFID DBL (SPONGE) ×2 IMPLANT
GAUZE SPONGE 4X4 12PLY STRL (GAUZE/BANDAGES/DRESSINGS) ×2 IMPLANT
GLOVE SURG ORTHO LTX SZ7.5 (GLOVE) ×4 IMPLANT
GOWN STRL REUS W/ TWL LRG LVL3 (GOWN DISPOSABLE) ×2 IMPLANT
GOWN STRL REUS W/TWL LRG LVL3 (GOWN DISPOSABLE) ×2
HEMOSTAT ARISTA ABSORB 3G PWDR (HEMOSTASIS) ×2 IMPLANT
KIT TURNOVER KIT A (KITS) ×2 IMPLANT
MANIFOLD NEPTUNE II (INSTRUMENTS) ×2 IMPLANT
NEEDLE HYPO 22GX1.5 SAFETY (NEEDLE) ×2 IMPLANT
PACK BASIN MAJOR ARMC (MISCELLANEOUS) ×2 IMPLANT
SHEARS FOC LG CVD HARMONIC 17C (MISCELLANEOUS) ×2 IMPLANT
SLEVE PROBE SENORX GAMMA FIND (MISCELLANEOUS) IMPLANT
SPONGE T-LAP 18X18 ~~LOC~~+RFID (SPONGE) ×6 IMPLANT
STAPLER SKIN PROX 35W (STAPLE) ×2 IMPLANT
SUT ETHILON 3-0 FS-10 30 BLK (SUTURE) ×4
SUT MNCRL AB 4-0 PS2 18 (SUTURE) ×4 IMPLANT
SUT SILK 2 0 (SUTURE) ×1
SUT SILK 2 0 SH (SUTURE) ×2 IMPLANT
SUT SILK 2-0 18XBRD TIE 12 (SUTURE) ×1 IMPLANT
SUT V-LOC 90 ABS 3-0 VLT  V-20 (SUTURE) ×2
SUT V-LOC 90 ABS 3-0 VLT V-20 (SUTURE) ×2 IMPLANT
SUT VIC AB 3-0 54X BRD REEL (SUTURE) ×1 IMPLANT
SUT VIC AB 3-0 BRD 54 (SUTURE) ×1
SUT VIC AB 3-0 SH 27 (SUTURE) ×2
SUT VIC AB 3-0 SH 27X BRD (SUTURE) ×2 IMPLANT
SUTURE EHLN 3-0 FS-10 30 BLK (SUTURE) ×2 IMPLANT
SWABSTK COMLB BENZOIN TINCTURE (MISCELLANEOUS) IMPLANT
SYR 10ML LL (SYRINGE) IMPLANT
SYR 20ML LL LF (SYRINGE) ×2 IMPLANT
SYR BULB IRRIG 60ML STRL (SYRINGE) ×2 IMPLANT
WATER STERILE IRR 1000ML POUR (IV SOLUTION) ×2 IMPLANT

## 2021-05-06 NOTE — Interval H&P Note (Signed)
History and Physical Interval Note:  05/06/2021 4:46 PM  Michelle Walker  has presented today for surgery, with the diagnosis of left breast cancer.  The various methods of treatment have been discussed with the patient and family. After consideration of risks, benefits and other options for treatment, the patient has consented to  Procedure(s) with comments: modified radical mastectomy (Left) - Provider requesting 1.5 hours / 90 minutes for procedure as a surgical intervention.  The patient's history has been reviewed, patient examined, no change in status, stable for surgery.  I have reviewed the patient's chart and labs.  Questions were answered to the patient's satisfaction.   LATE entry left side marked pre-op.    [name]

## 2021-05-06 NOTE — Anesthesia Procedure Notes (Signed)
Procedure Name: LMA Insertion Date/Time: 05/06/2021 1:57 PM Performed by: Posey Pronto, Deren Degrazia, CRNA Pre-anesthesia Checklist: Patient identified, Patient being monitored, Timeout performed, Emergency Drugs available and Suction available Patient Re-evaluated:Patient Re-evaluated prior to induction Oxygen Delivery Method: Circle system utilized Preoxygenation: Pre-oxygenation with 100% oxygen Induction Type: IV induction Ventilation: Mask ventilation without difficulty LMA: LMA inserted LMA Size: 4.0 Tube type: Oral Number of attempts: 1 Placement Confirmation: positive ETCO2 and breath sounds checked- equal and bilateral Tube secured with: Tape Dental Injury: Teeth and Oropharynx as per pre-operative assessment

## 2021-05-06 NOTE — Anesthesia Preprocedure Evaluation (Signed)
Anesthesia Evaluation  Patient identified by MRN, date of birth, ID band Patient awake    Reviewed: Allergy & Precautions, NPO status , Patient's Chart, lab work & pertinent test results  History of Anesthesia Complications Negative for: history of anesthetic complications  Airway Mallampati: II  TM Distance: >3 FB Neck ROM: Full    Dental no notable dental hx.    Pulmonary neg sleep apnea, neg COPD, Current Smoker and Patient abstained from smoking.,    breath sounds clear to auscultation- rhonchi (-) wheezing      Cardiovascular Exercise Tolerance: Good hypertension, Pt. 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               Wide excision/ RT, T1a,N0. ER/PR pos, Her 2.neg.               Tamoxifen x 6 months, d/c secondary to vasomotor               sympotms.  No date: Headache No date: Hypertension 2016: Personal history of radiation therapy     Comment:  RIGHT lumpectomy No date: Thyroid disease   Reproductive/Obstetrics                             Anesthesia Physical Anesthesia Plan  ASA: 2  Anesthesia Plan: General   Post-op Pain Management:    Induction: Intravenous  PONV Risk Score and Plan: 1 and Ondansetron and Dexamethasone  Airway Management Planned: LMA  Additional Equipment:   Intra-op Plan:    Post-operative Plan:   Informed Consent: I have reviewed the patients History and Physical, chart, labs and discussed the procedure including the risks, benefits and alternatives for the proposed anesthesia with the patient or authorized representative who has indicated his/her understanding and acceptance.     Dental advisory given  Plan Discussed with: CRNA and Anesthesiologist  Anesthesia Plan Comments:         Anesthesia Quick Evaluation

## 2021-05-06 NOTE — Op Note (Addendum)
SURGICAL OPERATIVE REPORT   DATE OF PROCEDURE: 05/06/2021  ATTENDING Surgeon(s): [name], MD   ANESTHESIA: General LMA    PRE-OPERATIVE DIAGNOSIS: Left breast core needle biopsy-proven invasive mammary carcinoma/diffuse/multifocal    POST-OPERATIVE DIAGNOSIS: Left breast core needle biopsy-proven invasive mammary carcinoma/diffuse/multifocal    PROCEDURE(S):  1.) Left modified radical mastectomy (cpt: 81017)   Axillary Lymph Node Dissection Synoptic Operative Report  Operation performed with curative intent:Yes  Resection was performed within the boundaries of the axillary vein, chest wall (serratus anterior), and latissimus dorsi:Yes  Nerves identified and preserved during dissection (select all that apply):Long Thoracic nerve and Thoracodorsal nerve  Level III nodes were removed:No  ESTIMATED BLOOD LOSS: Minimal (<50 mL)   URINE OUTPUT: No Foley    SPECIMENS: Left breast mastectomy and Left deep/superficial axillary contents/Level 1 & 2 lymph node(s)   IMPLANTS: None   DRAINS: 73 F round drains along inferior breast flap and into axilla   COMPLICATIONS: None apparent   CONDITION AT END OF PROCEDURE: Hemodynamically stable and extubated   DISPOSITION OF PATIENT: PACU   DETAILS OF PROCEDURE: Patient was brought to the operating suite and appropriately identified. General anesthesia was administered along with appropriate pre-operative antibiotics, and LM intubation was performed by anesthetist. In supine position, operative site was prepped and draped in the usual sterile fashion, and following a brief time out. Medial margin (lateral sternum), superior margin (clavical), inferior margin (inframammary fold), and lateral margin (just beyond the pectoralis major muscle/towards the anterior border of latissimus dorsi muscle) were marked/noted. Planned elliptical incision encompassing the nipple-areolar complex oriented transversely to encompass the tumor/axillary tail  of the breast was likewise marked/noted.  Elliptical skin incision encompassing the nipple-areolar complex was made, as noted above, using a #10 blade scalpel. Superior and then inferior skin flaps were separated from breast parenchyma in the avascular plane between subcutaneous tissue and breast parenchyma using electrocautery to maintain hemostasis. A long lateral suture and short superior suture were used to orient the specimen, which was then excised from the underlying pectoralis major fascia from medially to laterally, after which specimen was swung laterally and a lateral pedicle where breast parenchyma became axillary fat was identified, incised, and the specimen was handed off the field for pathology processing. Hemostasis was confirmed/achieved, and the mastectomy wound was copiously irrigated with warm sterile water.   The axillary package was then defined laterally by identifying the latissimus as the lateral boundary.  Medially the clavipectoral fascia was opened and dissected bluntly from the medial chest wall, freeing the tissues from behind the pectoralis minor.  The long thoracic nerve was identified, tested and preserved.  Dissection anteriorly and remaining inferior to the axillary vein was continued with caution utilizing the Harmonic Focus. The thoraco-dorsal neuro-vascular bundle was identified and preserved.  The intervening tissues were divided with the Harmonic Focus to the posterior limit of the subscapularis.  The contents were then taken distally preserving the above structures.  Hemostasis was maintained.  Irrigation with sterile water, and the surfaces dusted with Arista.   Two 15 drains placed and secure with 3-0 Nylon.  The bulbs marked "Axilla" and "Front." The dermis was approximated with a running V-Lock of 3-0 PDS, and Dermabond skin glue was applied to seal the incision and allowed to dry.  Drain sponges secured.  Patient was then safely able to be extubated, awakened,  and transferred to PACU for post-operative monitoring and care.   I was present for all aspects of the above procedure, and no  operative complications were apparent.

## 2021-05-06 NOTE — Progress Notes (Signed)
I spoke with Dr Christian Mate about IV placement. Per Dr Christian Mate patients IV could go in the right hand or foot. Spoke with Dr Randa Lynn anesthesia, she is fine with either but prefers the hand.

## 2021-05-06 NOTE — Transfer of Care (Signed)
Immediate Anesthesia Transfer of Care Note  Patient: Michelle Walker  Procedure(s) Performed: modified radical mastectomy (Left)  Patient Location: PACU  Anesthesia Type:General  Level of Consciousness: drowsy  Airway & Oxygen Therapy: Patient Spontanous Breathing  Post-op Assessment: Report given to RN and Post -op Vital signs reviewed and stable  Post vital signs: Reviewed and stable  Last Vitals:  Vitals Value Taken Time  BP    Temp    Pulse    Resp    SpO2      Last Pain:  Vitals:   05/06/21 1321  TempSrc: Oral  PainSc: 0-No pain         Complications: No notable events documented.

## 2021-05-07 ENCOUNTER — Encounter: Payer: Self-pay | Admitting: Surgery

## 2021-05-07 DIAGNOSIS — C50912 Malignant neoplasm of unspecified site of left female breast: Secondary | ICD-10-CM | POA: Diagnosis not present

## 2021-05-07 MED ORDER — IBUPROFEN 600 MG PO TABS
600.0000 mg | ORAL_TABLET | Freq: Four times a day (QID) | ORAL | 0 refills | Status: DC | PRN
Start: 2021-05-07 — End: 2021-05-28

## 2021-05-07 MED ORDER — CHLORHEXIDINE GLUCONATE CLOTH 2 % EX PADS
6.0000 | MEDICATED_PAD | Freq: Every day | CUTANEOUS | Status: DC
  Administered 2021-05-07: 6 via TOPICAL

## 2021-05-07 MED ORDER — HYDROCODONE-ACETAMINOPHEN 5-325 MG PO TABS: 1.0000 | ORAL_TABLET | Freq: Four times a day (QID) | ORAL | 0 refills | Status: DC | PRN

## 2021-05-07 NOTE — Discharge Instructions (Signed)
In addition to included general post-operative instructions,  Diet: Resume home diet.   Activity: No heavy lifting >20 pounds (children, pets, laundry, garbage) or strenuous activity for 2-4 weeks, but light activity and walking are encouraged. Do not drive or drink alcohol if taking narcotic pain medications or having pain that might distract from driving.  Wound care: If you can keep drain sites waterproofed, 2 days after surgery (07/08), you may shower/get incision wet with soapy water and pat dry (do not rub incisions), but no baths or submerging incision underwater until follow-up.   Drains: Monitor and record output daily. I did provide you handouts for this. We will likely remove these sequentially starting next week in the office.   Medications: Resume all home medications. For mild to moderate pain: acetaminophen (Tylenol) or ibuprofen/naproxen (if no kidney disease). Combining Tylenol with alcohol can substantially increase your risk of causing liver disease. Narcotic pain medications, if prescribed, can be used for severe pain, though may cause nausea, constipation, and drowsiness. Do not combine Tylenol and Percocet (or similar) within a 6 hour period as Percocet (and similar) contain(s) Tylenol. If you do not need the narcotic pain medication, you do not need to fill the prescription.  Call office [phone] / [phone]) at any time if any questions, worsening pain, fevers/chills, bleeding, drainage from incision site, or other concerns.

## 2021-05-07 NOTE — Progress Notes (Signed)
AVS reviewed and pt instructed how to care for drains. Pt verbalized understanding of all instructions and no concerns voiced.

## 2021-05-07 NOTE — Discharge Summary (Signed)
[facility] SURGICAL DISCHARGE SUMMARY  Patient ID: Michelle Walker MRN: [id number] DOB/AGE: [birth year] 60 y.o.  Admit date: 05/06/2021 Discharge date: 05/07/2021  Discharge Diagnoses Patient Active Problem List   Diagnosis Date Noted   Breast cancer metastasized to axillary lymph node, right ([place]) 05/06/2021   Breast cancer, left ([place]) 11/04/2020   Malignant neoplasm of upper-outer quadrant of left breast in female, estrogen receptor positive ([place]) 08/04/2015    Consultants None  Procedures 05/06/2021:  1.) Left modified radical mastectomy (cpt: 63846)  HPI: Michelle Walker is a 60 y.o. female with history of multifocal/multicentric left breast cancer spanning a large area, prior history of radiation, lymph node positivity who presents to [facility] on 07/06 for scheduled Left modified radical mastectomy with Dr Synthia Innocent Course: Informed consent was obtained and documented, and patient underwent uneventful left modified radical mastectomy  (Dr Christian Mate, 05/06/2021).  Post-operatively, patient did very well. The remainder of patient's hospital course was essentially unremarkable, and discharge planning was initiated accordingly with patient safely able to be discharged home with appropriate discharge instructions, pain control, and outpatient follow-up after all of her questions were answered to her expressed satisfaction.    Discharge Condition: Good   Physical Examination:  Constitutional: Well appearing female, NAD Pulmonary: Normal effort, no respiratory distress Skin: Left mastectomy incision is CDI with dermabond, drains x2 present, both serosanguinous    Allergies as of 05/07/2021       Reactions   Triamcinolone Other (See Comments)   Hypopigmentation s/p steroid injection   Tape Rash   SURGICAL TAPE AFTER BREAST BIOPSY        Medication List     TAKE these medications    allopurinol 100 MG tablet Commonly known as: ZYLOPRIM Take 100 mg  by mouth at bedtime.   amLODipine 10 MG tablet Commonly known as: NORVASC Take 10 mg by mouth at bedtime.   CALCIUM 600+D3 PO Take 1 tablet by mouth at bedtime.   HYDROcodone-acetaminophen 5-325 MG tablet Commonly known as: NORCO/VICODIN Take 1 tablet by mouth every 6 (six) hours as needed for moderate pain or severe pain.   ibuprofen 600 MG tablet Commonly known as: ADVIL Take 1 tablet (600 mg total) by mouth every 6 (six) hours as needed.   losartan 100 MG tablet Commonly known as: COZAAR Take 100 mg by mouth at bedtime.   metoprolol succinate 25 MG 24 hr tablet Commonly known as: TOPROL-XL Take 50 mg by mouth at bedtime.   potassium chloride SA 20 MEQ tablet Commonly known as: KLOR-CON TAKE 1 TABLET(20 MEQ) BY MOUTH DAILY   Proventil HFA 108 (90 Base) MCG/ACT inhaler Generic drug: albuterol Inhale 2 puffs into the lungs every 6 (six) hours as needed for shortness of breath or wheezing.          Follow-up Information     [name], MD. Schedule an appointment as soon as possible for a visit in 1 week(s).   Specialty: General Surgery Why: s/p left mastectomy; has drains x2 Contact information: [address] [place] [address] [phone]                  Time spent on discharge management including discussion of hospital course, clinical condition, outpatient instructions, prescriptions, and follow up with the patient and members of the medical team: >30 minutes  -- Edison Simon , PA-C [facility]  05/07/2021, 10:11 AM [phone] M-F: 7am - 4pm

## 2021-05-08 LAB — SURGICAL PATHOLOGY

## 2021-05-10 ENCOUNTER — Encounter: Payer: Self-pay | Admitting: Oncology

## 2021-05-10 NOTE — Progress Notes (Signed)
Hematology/Oncology Consult note [facility]  Telephone:([phone] Fax:([phone]  Patient Care Team: Casilda Carls, MD as PCP - General (Internal Medicine) Casilda Carls, MD as Consulting Physician (Internal Medicine) Christene Lye, MD (General Surgery) Garrel Ridgel, [place] as Consulting Physician (Podiatry) Sindy Guadeloupe, MD as Consulting Physician (Hematology and Oncology)   Name of the patient: Michelle Walker  [id number]  03-12-1961   Date of visit: 05/10/21  Diagnosis- recurrent left breast cancer stage II [id number] ER/PR positive HER-2 negative  Chief complaint/ Reason for visit- routine f/u of breast cancer s/p neoadjuvant chemotherapy  Heme/Onc history: Patient is a 60 year old female who was diagnosed with 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  She did not require adjuvant chemotherapy.  She was initially on tamoxifen which she could not tolerate and was switched to letrozole.  Last seen by me in June 2019 at which point she had a menstrual cycle and therefore not given another AI.  She had subsequently did not follow-up with me and remained off hormone therapy.     More recently patient underwent a diagnostic bilateral mammogram which showed a 2.2 x 1.9 x 1.9 cm mass at the 12 o'clock position of the left breast 1 cm from the nipple.  She was also found to have 5 other smaller breast masses ranging from 0.3 to 0.8 cm.  Noted to have a solitary left axillary lymph node with cortical thickening.  She had a biopsy of the dominant left breast mass as well as another smaller breast mass and the left axillary lymph node all of which were positive for metastatic invasive mammary carcinoma grade 2.  Tumor was ER 91 200% positive PR 71 to 80% positive and HER-2 IHC equivocal but negative by FISH   MRI of the bilateral breasts showed no abnormal findings in the right  breast.  At least 6 discrete hypoechoic masses in the left breast with the largest one measuring 2.2 cm with 1 abnormal left axillary lymph node.  The size of the other breast masses ranging from 45m to 1.4 cm.  The total area of masses and non-mass enhancement was about 10 x 5 x 6.5 cm.  CT chest abdomen and pelvis with contrast showed no evidence of distant metastatic disease.  Bone scan was also negative.   MammaPrint done on the biopsy specimen came back as high risk with greater than 12% chemotherapy benefit.  Plan is for neoadjuvant dose dense AC-Taxol chemotherapy followed by surgery.  Start of chemotherapy was delayed since patient tested positive for Covid  Patient completed neoadjuvant ddAC- taxol chemo between feb-June 2022.   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 She is not on any medications for that.  Reports left leg has been more swollen over the last 4-5 days  ECOG PS- 1 Pain scale- 2   Review of systems- Review of Systems  Constitutional:  Negative for chills, fever, malaise/fatigue and weight loss.  HENT:  Negative for congestion, ear discharge and nosebleeds.   Eyes:  Negative for blurred vision.  Respiratory:  Negative for cough, hemoptysis, sputum production, shortness of breath and wheezing.   Cardiovascular:  Negative for chest pain, palpitations, orthopnea and claudication.  Gastrointestinal:  Negative for abdominal pain, blood in stool, constipation, diarrhea, heartburn, melena, nausea and vomiting.  Genitourinary:  Negative for dysuria, flank pain, frequency, hematuria and  urgency.  Musculoskeletal:  Negative for back pain, joint pain and myalgias.       Left leg swelling  Skin:  Negative for rash.  Neurological:  Positive for sensory change (peripheral neuropathy). Negative for dizziness, tingling, focal weakness, seizures, weakness and headaches.  Endo/Heme/Allergies:  Does not bruise/bleed easily.   Psychiatric/Behavioral:  Negative for depression and suicidal ideas. The patient does not have insomnia.       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 Wide excision/ RT, T1a,N0. ER/PR pos, Her 2.neg. Tamoxifen x 6 months, d/c secondary to vasomotor sympotms.    Headache    Hypertension    Personal history of radiation therapy 2016   RIGHT lumpectomy   Thyroid disease      Past Surgical History:  Procedure Laterality Date   ANTERIOR CRUCIATE LIGAMENT REPAIR Right    BREAST BIOPSY Right 07/17/2015   INVASIVE LOBULAR CARCINOMA, CLASSIC TYPE (1.5 MM), ARISING IN A    BREAST BIOPSY Left 09/19/2020   [place] bx 3 areas/ 1oc q clip/ 12 oc vision clip, axilla lymph node hydromark shape 3/ path pending   BREAST LUMPECTOMY Right 08/22/2015   BREAST LUMPECTOMY WITH SENTINEL LYMPH NODE BIOPSY Right 08/22/2015   Procedure: BREAST LUMPECTOMY WITH SENTINEL LYMPH NODE BX;  Surgeon: Christene Lye, MD;  Location: [facility];  Service: General;  Laterality: Right;   COLONOSCOPY W/ POLYPECTOMY  2014   COLONOSCOPY WITH PROPOFOL N/A 04/16/2019   Procedure: COLONOSCOPY WITH BIOPSIES;  Surgeon: Lucilla Lame, MD;  Location: [place];  Service: Endoscopy;  Laterality: N/A;   DILATION AND CURETTAGE OF UTERUS     20 years ago   [place]     POLYPECTOMY N/A 04/16/2019   Procedure: POLYPECTOMY;  Surgeon: Lucilla Lame, MD;  Location: [place];  Service: Endoscopy;  Laterality: N/A;   PORTACATH PLACEMENT Right 12/01/2020   Procedure: INSERTION PORT-A-CATH;  Surgeon: [name], MD;  Location: [facility];  Service: General;  Laterality: Right;   TOTAL MASTECTOMY Left 05/06/2021   Procedure: modified radical  mastectomy;  Surgeon: [name], MD;  Location: [facility];  Service: General;  Laterality: Left;  Provider requesting 1.5 hours / 90 minutes for procedure    Social History   Socioeconomic History   Marital status: Married    Spouse name: Not on file   Number of children: Not on file   Years of education: Not on file   Highest education level: Not on file  Occupational History   Not on file  Tobacco Use   Smoking status: Every Day    Packs/day: 0.50    Years: 40.00    Pack years: 20.00    Types: Cigarettes   Smokeless tobacco: Never  Vaping Use   Vaping Use: Never used  Substance and Sexual Activity   Alcohol use: Yes    Alcohol/week: 2.0 standard drinks    Types: 2 Standard drinks or equivalent per week    Comment: BEER 1-2 QD   Drug use: No   Sexual activity: Yes    Comment: ablation  Other Topics Concern   Not on file  Social History Narrative   Not on file  Social Determinants of Health   Financial Resource Strain: Not on file  Food Insecurity: Not on file  Transportation Needs: Not on file  Physical Activity: Not on file  Stress: Not on file  Social Connections: Not on file  Intimate Partner Violence: Not on file    Family History  Problem Relation Age of Onset   Stroke Mother    Cancer Sister        sarcoma on arm; chemo   Diabetes Sister    Stroke Sister    Diabetes Brother    Breast cancer Neg Hx    Ovarian cancer Neg Hx    Colon cancer Neg Hx    Heart disease Neg Hx      Current Outpatient Medications:    allopurinol (ZYLOPRIM) 100 MG tablet, Take 100 mg by mouth at bedtime., Disp: , Rfl:    amLODipine (NORVASC) 10 MG tablet, Take 10 mg by mouth at bedtime. , Disp: , Rfl: 0   Calcium Carb-Cholecalciferol (CALCIUM 600+D3 PO), Take 1 tablet by mouth at bedtime., Disp: , Rfl:    losartan (COZAAR) 100 MG tablet, Take 100 mg by mouth at bedtime. , Disp: , Rfl: 0   metoprolol succinate (TOPROL-XL) 25 MG 24 hr tablet, Take 50 mg by mouth at  bedtime., Disp: , Rfl: 0   potassium chloride SA (KLOR-CON) 20 MEQ tablet, TAKE 1 TABLET(20 MEQ) BY MOUTH DAILY, Disp: 30 tablet, Rfl: 0   PROVENTIL HFA 108 (90 Base) MCG/ACT inhaler, Inhale 2 puffs into the lungs every 6 (six) hours as needed for shortness of breath or wheezing., Disp: , Rfl:    HYDROcodone-acetaminophen (NORCO/VICODIN) 5-325 MG tablet, Take 1 tablet by mouth every 6 (six) hours as needed for moderate pain or severe pain., Disp: 20 tablet, Rfl: 0   ibuprofen (ADVIL) 600 MG tablet, Take 1 tablet (600 mg total) by mouth every 6 (six) hours as needed., Disp: 30 tablet, Rfl: 0 No current facility-administered medications for this visit.  Facility-Administered Medications Ordered in Other Visits:    sodium chloride flush (NS) 0.9 % injection 10 mL, 10 mL, Intravenous, PRN, Sindy Guadeloupe, MD, 10 mL at 03/27/21 0813  Physical exam:  Vitals:   05/01/21 1431  BP: (!) 145/99  Pulse: 100  Resp: 20  Temp: 98.2 F (36.8 C)  SpO2: 100%   Physical Exam Constitutional:      General: She is not in acute distress. Cardiovascular:     Rate and Rhythm: Regular rhythm. Tachycardia present.     Heart sounds: Normal heart sounds.  Pulmonary:     Effort: Pulmonary effort is normal.     Breath sounds: Normal breath sounds.  Abdominal:     General: Bowel sounds are normal.     Palpations: Abdomen is soft.  Musculoskeletal:     Comments: LLE swelling  Skin:    General: Skin is warm and dry.  Neurological:     Mental Status: She is alert and oriented to person, place, and time.     CMP Latest Ref Rng & Units 05/05/2021  Glucose 70 - 99 mg/dL -  BUN 6 - 20 mg/dL -  Creatinine 0.44 - 1.00 mg/dL -  Sodium 135 - 145 mmol/L -  Potassium 3.5 - 5.1 mmol/L 3.9  Chloride 98 - 111 mmol/L -  CO2 22 - 32 mmol/L -  Calcium 8.9 - 10.3 mg/dL -  Total Protein 6.5 - 8.1 g/dL -  Total Bilirubin 0.3 - 1.2 mg/dL -  Alkaline  Phos 38 - 126 U/L -  AST 15 - 41 U/L -  ALT 0 - 44 U/L -   CBC Latest  Ref Rng & Units 05/01/2021  WBC 4.0 - 10.5 K/uL 4.2  Hemoglobin 12.0 - 15.0 g/dL 11.9(L)  Hematocrit 36.0 - 46.0 % 37.8  Platelets 150 - 400 K/uL 181    No images are attached to the encounter.  MM BREAST SURGICAL SPECIMEN  Result Date: 05/06/2021 CLINICAL DATA:  Specimen radiograph of the left axilla. Patient is scheduled for a mastectomy. EXAM: SPECIMEN RADIOGRAPH OF THE LEFT AXILLA COMPARISON:  Previous exam(s). FINDINGS: Status post excision of the left axilla. There is a HydroMARK clip in the specimen radiograph. IMPRESSION: Specimen radiograph of the left axilla. Electronically Signed   By: Lillia Mountain M.D.   On: 05/06/2021 16:14  US Venous Img Lower Unilateral Left  Result Date: 05/01/2021 CLINICAL DATA:  Left lower extremity pain and swelling EXAM: LEFT LOWER EXTREMITY VENOUS DOPPLER ULTRASOUND TECHNIQUE: Gray-scale sonography with graded compression, as well as color Doppler and duplex ultrasound were performed to evaluate the lower extremity deep venous systems from the level of the common femoral vein and including the common femoral, femoral, profunda femoral, popliteal and calf veins including the posterior tibial, peroneal and gastrocnemius veins when visible. The superficial great saphenous vein was also interrogated. Spectral Doppler was utilized to evaluate flow at rest and with distal augmentation maneuvers in the common femoral, femoral and popliteal veins. COMPARISON:  None. FINDINGS: Contralateral Common Femoral Vein: Respiratory phasicity is normal and symmetric with the symptomatic side. No evidence of thrombus. Normal compressibility. Common Femoral Vein: No evidence of thrombus. Normal compressibility, respiratory phasicity and response to augmentation. Saphenofemoral Junction: No evidence of thrombus. Normal compressibility and flow on color Doppler imaging. Profunda Femoral Vein: No evidence of thrombus. Normal compressibility and flow on color Doppler imaging. Femoral Vein: No  evidence of thrombus. Normal compressibility, respiratory phasicity and response to augmentation. Popliteal Vein: No evidence of thrombus. Normal compressibility, respiratory phasicity and response to augmentation. Calf Veins: No evidence of thrombus. Normal compressibility and flow on color Doppler imaging. IMPRESSION: No evidence of deep venous thrombosis. Electronically Signed   By: Jerilynn Mages.  Shick M.D.   On: 05/01/2021 16:39     Assessment and plan- Patient is a 60 y.o. 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  she is s/p 4 cycles of dose dense AC and 12 weekly cycles of taxol neoadjuvant chemotherapy.  This is a routine f/u prior to upcoming surgery   Patient has completed all neoadjuvant chemotherapy and will be undergoing left radical mastectomy on 05/06/21. I will see her following that to discuss final pathology results and further management  LLE USG was checked today to r/o DVT and was negative for DVT     Visit Diagnosis 1. Pain of left lower leg   2. Swelling of limb   3. Malignant neoplasm of nipple of left breast in female, estrogen receptor positive ([place])      Dr. [name], MD, MPH [facility] at [facility] [phone] 05/10/2021 5:33 PM

## 2021-05-12 ENCOUNTER — Other Ambulatory Visit: Payer: Self-pay

## 2021-05-12 ENCOUNTER — Encounter: Payer: Self-pay | Admitting: Surgery

## 2021-05-12 ENCOUNTER — Ambulatory Visit (INDEPENDENT_AMBULATORY_CARE_PROVIDER_SITE_OTHER): Payer: 59 | Admitting: Surgery

## 2021-05-12 VITALS — BP 135/88 | HR 109 | Temp 97.7°F | Wt 185.8 lb

## 2021-05-12 DIAGNOSIS — Z9012 Acquired absence of left breast and nipple: Secondary | ICD-10-CM | POA: Insufficient documentation

## 2021-05-12 NOTE — Patient Instructions (Signed)
If you have any concerns or questions, please feel free to call our office. See follow up appointment below.   Total or Modified Radical Mastectomy, Care After This sheet gives you information about how to care for yourself after your procedure. Your health care provider may also give you more specific instructions. If you have problems or questions, contact your health careprovider. What can I expect after the procedure? After the procedure, it is common to have: Pain. Numbness. Stiffness in the arm or shoulder. Feelings of stress, sadness, or depression. If the lymph nodes under your arm were removed, you may have arm swelling,weakness, or numbness on the same side of your body as your surgery. Follow these instructions at home: Incision care  Follow instructions from your health care provider about how to take care of your incision. Make sure you: Wash your hands with soap and water before you change your bandage (dressing). If soap and water are not available, use hand sanitizer. Change your dressing as told by your health care provider. Leave stitches (sutures), skin glue, or adhesive strips in place. These skin closures may need to stay in place for 2 weeks or longer. If adhesive strip edges start to loosen and curl up, you may trim the loose edges. Do not remove adhesive strips completely unless your health care provider tells you to do that. Check your incision area every day for signs of infection. Check for: Redness, swelling, or more pain. Fluid or blood. Warmth. Pus or a bad smell. If you were sent home with a surgical drain in place, follow instructions from your health care provider about emptying it.  Bathing Do not take baths, swim, or use a hot tub until your health care provider approves. Ask your health care provider if you may take showers. You may only be allowed to take sponge baths. Activity  Return to your normal activities as told by your health care provider.  Ask your health care provider what activities are safe for you. Avoid activities that take a lot of effort. Be careful to avoid any activities that could cause an injury to your arm on the side of your surgery. Do not lift anything that is heavier than 10 lb (4.5 kg), or the limit that you are told, until your health care provider says that it is safe. Avoid lifting with the arm on the side of your surgery. Do not carry heavy objects on your shoulder. After your drain is removed, do exercises to prevent stiffness and swelling in your arm. Talk with your health care provider about which exercises are safe for you.  General instructions Take over-the-counter and prescription medicines only as told by your health care provider. You may eat what you usually do. Keep your arm raised (elevated) above the level of your heart when you are sitting or lying down. Do not wear tight jewelry on your arm, wrist, or fingers on the side of your surgery. You may be given a tight sleeve (compression bandage) to wear over your arm on the side of your surgery. Wear this sleeve as told by your health care provider. Ask your health care provider when you can start wearing a bra or using a breast prosthesis. Before you are involved in certain procedures such as giving blood or having your blood pressure checked, tell all your health care providers if lymph nodes under your arm were removed. This is important information. Follow-up Keep all follow-up visits as told by your health care provider. This  is important. Get checked for extra fluid around your lymph nodes (lymphedema) as often as told by your health care provider. Contact a health care provider if: You have a fever. Your pain medicine is not working. Your arm swelling, weakness, or numbness has not improved after a few weeks. You have new swelling in your breast area or arm. You have redness, swelling, or more pain in your incision area. You have fluid or  blood coming from your incision. Your incision feels warm to the touch. You have pus or a bad smell coming from your incision. Get help right away if: You have very bad pain in your breast area or arm. You have chest pain. You have difficulty breathing. Summary Follow instructions from your health care provider about how to take care of your incision. Check your incision area every day for signs of infection. Ask your health care provider what activities are safe for you. Keep all follow-up visits as told by your health care provider. This is important. Make sure you know which symptoms should cause you to contact your health care provider or to get help right away. This information is not intended to replace advice given to you by your health care provider. Make sure you discuss any questions you have with your healthcare provider. Document Revised: 05/09/2020 Document Reviewed: 05/09/2020 Elsevier Patient Education  [place].

## 2021-05-12 NOTE — Progress Notes (Signed)
[facility] POST-OP OFFICE VISIT  05/12/2021  HPI: ADREA SHERPA is a 60 y.o. female 6 days s/p left MRM.  No issues reported, she's kept a great record of JP output and it appears we can remove the anterior one.  No f/c. No c/o pain.   Vital signs: There were no vitals taken for this visit.   Physical Exam: Constitutional: Appears well.   Skin: left skin flaps are c/d/I without calor or questionable viability.  JP out serous.  Removed anterior drain.   Assessment/Plan: This is a 60 y.o. female 6 days s/p left MRM.    Reviewed path:  3/12 lymph nodes with metastatic cancer.   Patient Active Problem List   Diagnosis Date Noted   Breast cancer metastasized to axillary lymph node, right ([place]) 05/06/2021   Prophylaxis for chemotherapy-induced neutropenia 01/16/2021   Genetic testing 11/26/2020   Goals of care, counseling/discussion 11/04/2020   Breast cancer, left ([place]) 11/04/2020   History of colonic polyps    Polyp of ascending colon    Depression 09/20/2017   DM2 (diabetes mellitus, type 2) ([place]) 09/20/2017   Gout 09/20/2017   HTN (hypertension) 09/20/2017   Migraine 09/20/2017   History of endometrial ablation 02/26/2016   Malignant neoplasm of upper-outer quadrant of left breast in female, estrogen receptor positive ([place]) 08/04/2015    - Continue axillary drain.  Follow up next week.    [name] M.D., FACS 05/12/2021, 9:09 AM

## 2021-05-13 NOTE — Anesthesia Postprocedure Evaluation (Signed)
Anesthesia Post Note  Patient: Michelle Walker  Procedure(s) Performed: modified radical mastectomy (Left)  Patient location during evaluation: PACU Anesthesia Type: General Level of consciousness: awake and alert Pain management: pain level controlled Vital Signs Assessment: post-procedure vital signs reviewed and stable Respiratory status: spontaneous breathing, nonlabored ventilation, respiratory function stable and patient connected to nasal cannula oxygen Cardiovascular status: blood pressure returned to baseline and stable Postop Assessment: no apparent nausea or vomiting Anesthetic complications: no   No notable events documented.   Last Vitals:  Vitals:   05/07/21 0448 05/07/21 0840  BP: 113/75 124/80  Pulse: 82 (!) 102  Resp: 16 17  Temp: 36.8 C 36.6 C  SpO2: 92% 97%    Last Pain:  Vitals:   05/07/21 0840  TempSrc: Oral  PainSc: Okay Siham Bucaro

## 2021-05-19 ENCOUNTER — Encounter: Payer: Self-pay | Admitting: Surgery

## 2021-05-19 ENCOUNTER — Ambulatory Visit (INDEPENDENT_AMBULATORY_CARE_PROVIDER_SITE_OTHER): Payer: 59 | Admitting: Surgery

## 2021-05-19 ENCOUNTER — Other Ambulatory Visit: Payer: Self-pay

## 2021-05-19 VITALS — BP 151/90 | HR 94 | Temp 98.1°F | Ht 65.0 in | Wt 186.8 lb

## 2021-05-19 DIAGNOSIS — Z9012 Acquired absence of left breast and nipple: Secondary | ICD-10-CM

## 2021-05-19 NOTE — Progress Notes (Signed)
[facility] POST-OP OFFICE VISIT  05/19/2021  HPI: Michelle Walker is a 60 y.o. female 13 days s/p left modified radical mastectomy.  Her largest complaint is that she threw her back out on Saturday morning and seems to be suffering significantly taking only Tylenol.  She prefers to take ibuprofen with more food than she is able to tolerate currently, and she does not like the side effects of narcotics and muscle relaxers. Her JP drain output appears to be over 100 mils per 24 hours her current rates.  It appears to be serous.  She does not appear to be retaining any fluid under her flaps.  I have evaluated her today's sitting up.  Vital signs: BP (!) 151/90   Pulse 94   Temp 98.1 F (36.7 C) (Oral)   Ht 5\' 5"  (1.651 m)   Wt 186 lb 12.8 oz (84.7 kg)   SpO2 99%   BMI 31.09 kg/m    Physical Exam: Constitutional: She appears to be in some pain.  Skin: Flaps are clearly viable, there is no calor or induration present.  There is no fluctuance consistent with retained fluid.  I milked the drain, it remains well secured.  Did alleviate some of the potential clogging within the tubing.  We replaced the dressing over the drain.  Assessment/Plan: This is a 60 y.o. female 13 days s/p left modified radical mastectomy.  Patient Active Problem List   Diagnosis Date Noted   S/P mastectomy, left 05/12/2021   Breast cancer metastasized to axillary lymph node, right ([place]) 05/06/2021   Prophylaxis for chemotherapy-induced neutropenia 01/16/2021   Genetic testing 11/26/2020   Goals of care, counseling/discussion 11/04/2020   Breast cancer, left ([place]) 11/04/2020   History of colonic polyps    Polyp of ascending colon    Depression 09/20/2017   DM2 (diabetes mellitus, type 2) ([place]) 09/20/2017   Gout 09/20/2017   HTN (hypertension) 09/20/2017   Migraine 09/20/2017   History of endometrial ablation 02/26/2016   Malignant neoplasm of upper-outer quadrant of left breast in female,  estrogen receptor positive ([place]) 08/04/2015    -Continue to drain, will have her follow-up in about 9 days.  Hopefully will see more progress and diminishing output.   [name] M.D., FACS 05/19/2021, 11:00 AM

## 2021-05-19 NOTE — Patient Instructions (Signed)
If you have any concerns or questions, please feel free to call our office. See follow up appointment below. 

## 2021-05-28 ENCOUNTER — Encounter: Payer: Self-pay | Admitting: Surgery

## 2021-05-28 ENCOUNTER — Other Ambulatory Visit: Payer: Self-pay

## 2021-05-28 ENCOUNTER — Ambulatory Visit (INDEPENDENT_AMBULATORY_CARE_PROVIDER_SITE_OTHER): Payer: 59 | Admitting: Surgery

## 2021-05-28 VITALS — BP 180/92 | HR 99 | Temp 98.3°F | Ht 65.0 in | Wt 184.0 lb

## 2021-05-28 DIAGNOSIS — Z9012 Acquired absence of left breast and nipple: Secondary | ICD-10-CM

## 2021-05-28 NOTE — Progress Notes (Signed)
[facility] POST-OP OFFICE VISIT  05/28/2021  HPI: Michelle Walker is a 60 y.o. female 22 days s/p left MRM.  Her JP drain in the axilla, is draining serous serous fluid and over the last 3 days she drained 25, 20 and 20 on a daily basis.  She has a bit of a dogear under the axilla which is fluid to the touch, but we did an ultrasound at the bedside and revealed that she has no retained serous fluid. She reported that her previously reported back pain had resolved shortly after her last visit.  Vital signs: There were no vitals taken for this visit.   Physical Exam: Constitutional: She appears well.  Skin: Flaps are well-healed and opposed, there appears to be no underlying seroma.  The ultrasound was noted above showing no seroma within the infra axillary dogear.  We proceeded with removal of the JP drain after confirming adequate bulb functioning, and milking of the tube to assure that there is no residual.  Assessment/Plan: This is a 60 y.o. female 22 days s/p left modified radical mastectomy.  Patient Active Problem List   Diagnosis Date Noted   S/P mastectomy, left 05/12/2021   Breast cancer metastasized to axillary lymph node, right ([place]) 05/06/2021   Prophylaxis for chemotherapy-induced neutropenia 01/16/2021   Genetic testing 11/26/2020   Goals of care, counseling/discussion 11/04/2020   Breast cancer, left ([place]) 11/04/2020   History of colonic polyps    Polyp of ascending colon    Depression 09/20/2017   DM2 (diabetes mellitus, type 2) ([place]) 09/20/2017   Gout 09/20/2017   HTN (hypertension) 09/20/2017   Migraine 09/20/2017   History of endometrial ablation 02/26/2016   Malignant neoplasm of upper-outer quadrant of left breast in female, estrogen receptor positive ([place]) 08/04/2015    -Drain removal today.  Apply dressing, may shower.  Follow-up June 11, 2021 or as needed.   [name] M.D., FACS 05/28/2021, 10:57 AM

## 2021-05-28 NOTE — Patient Instructions (Signed)
Change your dressing daily until it closes.  Follow up in two weeks.

## 2021-06-01 ENCOUNTER — Telehealth: Payer: Self-pay | Admitting: Surgery

## 2021-06-01 NOTE — Telephone Encounter (Signed)
I will send a message to Dr Christian Mate about this.

## 2021-06-01 NOTE — Telephone Encounter (Signed)
Patient is calling and said she is coming back in another week, patient is asking if she will be having her port removed that day, if not can she. Please call patient and advise.

## 2021-06-01 NOTE — Telephone Encounter (Signed)
Per Dr Christian Mate we are alright with removing the patient's port at her next visit. I let her know it would take about 30 minutes and that she did not need to have someone with her for this.

## 2021-06-02 ENCOUNTER — Other Ambulatory Visit: Payer: Self-pay

## 2021-06-02 ENCOUNTER — Encounter: Payer: Self-pay | Admitting: Oncology

## 2021-06-02 ENCOUNTER — Inpatient Hospital Stay (HOSPITAL_BASED_OUTPATIENT_CLINIC_OR_DEPARTMENT_OTHER): Payer: 59 | Admitting: Oncology

## 2021-06-02 VITALS — BP 141/104 | HR 103 | Temp 98.0°F | Resp 16 | Ht 65.0 in | Wt 184.9 lb

## 2021-06-02 DIAGNOSIS — Z170 Estrogen receptor positive status [ER+]: Secondary | ICD-10-CM | POA: Insufficient documentation

## 2021-06-02 DIAGNOSIS — C50411 Malignant neoplasm of upper-outer quadrant of right female breast: Secondary | ICD-10-CM | POA: Diagnosis not present

## 2021-06-02 DIAGNOSIS — I10 Essential (primary) hypertension: Secondary | ICD-10-CM | POA: Insufficient documentation

## 2021-06-02 DIAGNOSIS — Z7189 Other specified counseling: Secondary | ICD-10-CM | POA: Diagnosis not present

## 2021-06-02 DIAGNOSIS — C50812 Malignant neoplasm of overlapping sites of left female breast: Secondary | ICD-10-CM | POA: Insufficient documentation

## 2021-06-02 DIAGNOSIS — Z510 Encounter for antineoplastic radiation therapy: Secondary | ICD-10-CM | POA: Diagnosis not present

## 2021-06-02 DIAGNOSIS — C773 Secondary and unspecified malignant neoplasm of axilla and upper limb lymph nodes: Secondary | ICD-10-CM | POA: Insufficient documentation

## 2021-06-02 DIAGNOSIS — F17210 Nicotine dependence, cigarettes, uncomplicated: Secondary | ICD-10-CM | POA: Insufficient documentation

## 2021-06-02 DIAGNOSIS — Z853 Personal history of malignant neoplasm of breast: Secondary | ICD-10-CM | POA: Insufficient documentation

## 2021-06-02 DIAGNOSIS — Z79899 Other long term (current) drug therapy: Secondary | ICD-10-CM | POA: Insufficient documentation

## 2021-06-02 DIAGNOSIS — Z79811 Long term (current) use of aromatase inhibitors: Secondary | ICD-10-CM | POA: Insufficient documentation

## 2021-06-02 DIAGNOSIS — Z923 Personal history of irradiation: Secondary | ICD-10-CM | POA: Insufficient documentation

## 2021-06-02 NOTE — Progress Notes (Signed)
Pt feels the surgery site is tender and the skin underneath axilla there is tissue there and not sure if it is fluid or fat there. She says it bothers her with rubbing under arm and it hangs down on the side of arm. She places a pillow at night in between the tissue and the arm rubs and tender. She does not have any f/u with surgeon.

## 2021-06-04 NOTE — Progress Notes (Signed)
Hematology/Oncology Consult note [facility]  Telephone:([phone] [phone]  Patient Care Team: Casilda Carls, MD as PCP - General (Internal Medicine) Casilda Carls, MD as Consulting Physician (Internal Medicine) Christene Lye, MD (General Surgery) Garrel Ridgel, [place] as Consulting Physician (Podiatry) Sindy Guadeloupe, MD as Consulting Physician (Hematology and Oncology)   Name of the patient: Michelle Walker  [id number]  1961-05-20   Date of visit: 06/04/21  Diagnosis- recurrent left breast cancer stage II [id number] ER/PR positive HER-2 negative    Chief complaint/ Reason for visit-discuss final pathology results and further management  Heme/Onc history: Patient is a 60 year old female who was diagnosed with 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  She did not require adjuvant chemotherapy.  She was initially on tamoxifen which she could not tolerate and was switched to letrozole.  Last seen by me in June 2019 at which point she had a menstrual cycle and therefore not given another AI.  She had subsequently did not follow-up with me and remained off hormone therapy.     More recently patient underwent a diagnostic bilateral mammogram which showed a 2.2 x 1.9 x 1.9 cm mass at the 12 o'clock position of the left breast 1 cm from the nipple.  She was also found to have 5 other smaller breast masses ranging from 0.3 to 0.8 cm.  Noted to have a solitary left axillary lymph node with cortical thickening.  She had a biopsy of the dominant left breast mass as well as another smaller breast mass and the left axillary lymph node all of which were positive for metastatic invasive mammary carcinoma grade 2.  Tumor was ER 91 200% positive PR 71 to 80% positive and HER-2 IHC equivocal but negative by FISH   MRI of the bilateral breasts showed no abnormal findings in the right  breast.  At least 6 discrete hypoechoic masses in the left breast with the largest one measuring 2.2 cm with 1 abnormal left axillary lymph node.  The size of the other breast masses ranging from 34m to 1.4 cm.  The total area of masses and non-mass enhancement was about 10 x 5 x 6.5 cm.  CT chest abdomen and pelvis with contrast showed no evidence of distant metastatic disease.  Bone scan was also negative.   MammaPrint done on the biopsy specimen came back as high risk with greater than 12% chemotherapy benefit.  Plan is for neoadjuvant dose dense AC-Taxol chemotherapy followed by surgery.  Start of chemotherapy was delayed since patient tested positive for Covid   Patient completed neoadjuvant ddAC- taxol chemo between feb-June 2022.    Patient underwent left mastectomy with axillary lymph node dissection.  Final pathology showed 12 mm multifocal invasive mammary carcinoma at least 3 of them in number overall grade 2 with negative margins.  Metastatic carcinoma involving 3 out of 12 lymph nodes.  No extranodal extension identified.  Largest site of metastatic deposit 3 mm.  pT1c pN1a   Interval history- patient is concerned about a pocket of fat which is protruding at the lateral end of her mastectomy scar which rubs against her arm and makes it difficult for her to sleep.  ECOG PS- 1 Pain scale- 0  Review of systems- Review of Systems  Constitutional:  Negative for chills, fever, malaise/fatigue and weight loss.  HENT:  Negative for congestion, ear discharge and nosebleeds.   Eyes:  Negative  for blurred vision.  Respiratory:  Negative for cough, hemoptysis, sputum production, shortness of breath and wheezing.   Cardiovascular:  Negative for chest pain, palpitations, orthopnea and claudication.  Gastrointestinal:  Negative for abdominal pain, blood in stool, constipation, diarrhea, heartburn, melena, nausea and vomiting.  Genitourinary:  Negative for dysuria, flank pain, frequency, hematuria  and urgency.  Musculoskeletal:  Negative for back pain, joint pain and myalgias.  Skin:  Negative for rash.  Neurological:  Negative for dizziness, tingling, focal weakness, seizures, weakness and headaches.  Endo/Heme/Allergies:  Does not bruise/bleed easily.  Psychiatric/Behavioral:  Negative for depression and suicidal ideas. The patient does not have insomnia.       Allergies  Allergen Reactions   Triamcinolone Other (See Comments)    Hypopigmentation s/p steroid injection   Tape Rash    SURGICAL TAPE AFTER BREAST BIOPSY     Past Medical History:  Diagnosis Date   Anemia    Arthritis    Breast cancer ([place]) 08/2015   1.5 mm invasive lobular cancer, 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 Wide excision/ RT, T1a,N0. ER/PR pos, Her 2.neg. Tamoxifen x 6 months, d/c secondary to vasomotor sympotms.    Headache    Hypertension    Personal history of radiation therapy 2016   RIGHT lumpectomy   Thyroid disease      Past Surgical History:  Procedure Laterality Date   ANTERIOR CRUCIATE LIGAMENT REPAIR Right    BREAST BIOPSY Right 07/17/2015   INVASIVE LOBULAR CARCINOMA, CLASSIC TYPE (1.5 MM), ARISING IN A    BREAST BIOPSY Left 09/19/2020   [place] bx 3 areas/ 1oc q clip/ 12 oc vision clip, axilla lymph node hydromark shape 3/ path pending   BREAST LUMPECTOMY Right 08/22/2015   BREAST LUMPECTOMY WITH SENTINEL LYMPH NODE BIOPSY Right 08/22/2015   Procedure: BREAST LUMPECTOMY WITH SENTINEL LYMPH NODE BX;  Surgeon: Christene Lye, MD;  Location: [facility];  Service: General;  Laterality: Right;   COLONOSCOPY W/ POLYPECTOMY  2014   COLONOSCOPY WITH PROPOFOL N/A 04/16/2019   Procedure: COLONOSCOPY WITH BIOPSIES;  Surgeon: Lucilla Lame, MD;  Location: [place];  Service: Endoscopy;  Laterality: N/A;   DILATION AND CURETTAGE OF UTERUS     20 years ago   [place]     POLYPECTOMY N/A 04/16/2019   Procedure: POLYPECTOMY;  Surgeon: Lucilla Lame, MD;  Location: [place];   Service: Endoscopy;  Laterality: N/A;   PORTACATH PLACEMENT Right 12/01/2020   Procedure: INSERTION PORT-A-CATH;  Surgeon: [name], MD;  Location: [facility];  Service: General;  Laterality: Right;   TOTAL MASTECTOMY Left 05/06/2021   Procedure: modified radical mastectomy;  Surgeon: [name], MD;  Location: [facility];  Service: General;  Laterality: Left;  Provider requesting 1.5 hours / 90 minutes for procedure    Social History   Socioeconomic History   Marital status: Married    Spouse name: Not on file   Number of children: Not on file   Years of education: Not on file   Highest education level: Not on file  Occupational History   Not on file  Tobacco Use   Smoking status: Every Day    Packs/day: 0.75    Years: 40.00    Pack years: 30.00    Types: Cigarettes   Smokeless tobacco: Never  Vaping Use   Vaping Use: Never used  Substance and Sexual Activity   Alcohol use: Yes    Alcohol/week: 2.0 standard drinks    Types: 2 Standard drinks or equivalent  per week    Comment: BEER 1-2 QD   Drug use: No   Sexual activity: Yes    Comment: ablation  Other Topics Concern   Not on file  Social History Narrative   Not on file   Social Determinants of Health   Financial Resource Strain: Not on file  Food Insecurity: Not on file  Transportation Needs: Not on file  Physical Activity: Not on file  Stress: Not on file  Social Connections: Not on file  Intimate Partner Violence: Not on file    Family History  Problem Relation Age of Onset   Stroke Mother    Cancer Sister        sarcoma on arm; chemo   Diabetes Sister    Stroke Sister    Diabetes Brother    Breast cancer Neg Hx    Ovarian cancer Neg Hx    Colon cancer Neg Hx    Heart disease Neg Hx      Current Outpatient Medications:    allopurinol (ZYLOPRIM) 100 MG tablet, Take 100 mg by mouth at bedtime., Disp: , Rfl:    amLODipine (NORVASC) 10 MG tablet, Take 10 mg by mouth at bedtime. , Disp: , Rfl:  0   Calcium Carb-Cholecalciferol (CALCIUM 600+D3 PO), Take 1 tablet by mouth at bedtime., Disp: , Rfl:    losartan (COZAAR) 100 MG tablet, Take 100 mg by mouth at bedtime. , Disp: , Rfl: 0   metoprolol succinate (TOPROL-XL) 25 MG 24 hr tablet, Take 50 mg by mouth at bedtime., Disp: , Rfl: 0   potassium chloride SA (KLOR-CON) 20 MEQ tablet, TAKE 1 TABLET(20 MEQ) BY MOUTH DAILY (Patient not taking: Reported on 06/02/2021), Disp: 30 tablet, Rfl: 0   PROVENTIL HFA 108 (90 Base) MCG/ACT inhaler, Inhale 2 puffs into the lungs every 6 (six) hours as needed for shortness of breath or wheezing. (Patient not taking: Reported on 06/02/2021), Disp: , Rfl:  No current facility-administered medications for this visit.  Facility-Administered Medications Ordered in Other Visits:    sodium chloride flush (NS) 0.9 % injection 10 mL, 10 mL, Intravenous, PRN, Sindy Guadeloupe, MD, 10 mL at 03/27/21 0813  Physical exam:  Vitals:   06/02/21 1342  BP: (!) 141/104  Pulse: (!) 103  Resp: 16  Temp: 98 F (36.7 C)  TempSrc: Oral  Weight: 184 lb 14.4 oz (83.9 kg)  Height: _0  (1.651 m)   Physical Exam Constitutional:      General: She is not in acute distress. Cardiovascular:     Rate and Rhythm: Normal rate and regular rhythm.     Heart sounds: Normal heart sounds.  Pulmonary:     Effort: Pulmonary effort is normal.     Breath sounds: Normal breath sounds.  Abdominal:     General: Bowel sounds are normal.     Palpations: Abdomen is soft.  Skin:    General: Skin is warm and dry.  Neurological:     Mental Status: She is alert and oriented to person, place, and time.  Breast exam: Patient is s/p left mastectomy with the surgical scar that appears to be healing well.  There is a 2 cm area of outpouching which appears to be subcutaneous fat at the lateral end of the mastectomy scar  CMP Latest Ref Rng & Units 05/05/2021  Glucose 70 - 99 mg/dL -  BUN 6 - 20 mg/dL -  Creatinine 0.44 - 1.00 mg/dL -  Sodium 135 -  145 mmol/L -  Potassium 3.5 - 5.1 mmol/L 3.9  Chloride 98 - 111 mmol/L -  CO2 22 - 32 mmol/L -  Calcium 8.9 - 10.3 mg/dL -  Total Protein 6.5 - 8.1 g/dL -  Total Bilirubin 0.3 - 1.2 mg/dL -  Alkaline Phos 38 - 126 U/L -  AST 15 - 41 U/L -  ALT 0 - 44 U/L -   CBC Latest Ref Rng & Units 05/01/2021  WBC 4.0 - 10.5 K/uL 4.2  Hemoglobin 12.0 - 15.0 g/dL 11.9(L)  Hematocrit 36.0 - 46.0 % 37.8  Platelets 150 - 400 K/uL 181    No images are attached to the encounter.  MM BREAST SURGICAL SPECIMEN  Result Date: 05/06/2021 CLINICAL DATA:  Specimen radiograph of the left axilla. Patient is scheduled for a mastectomy. EXAM: SPECIMEN RADIOGRAPH OF THE LEFT AXILLA COMPARISON:  Previous exam(s). FINDINGS: Status post excision of the left axilla. There is a HydroMARK clip in the specimen radiograph. IMPRESSION: Specimen radiograph of the left axilla. Electronically Signed   By: Lillia Mountain M.D.   On: 05/06/2021 16:14    Assessment and plan- Patient is a 60 y.o. 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  she is s/p 4 cycles of dose dense AC and 12 weekly cycles of taxol neoadjuvant chemotherapy.  Patient underwent left mastectomy without reconstruction and final pathology showed multifocal invasive mammary carcinoma [id number]  Discussed the results of final pathology with the patient in detail which showed multifocal invasive mammary carcinoma at least 3 in number with the largest focus that was 12 mm grade 2.  3 out of 12 lymph nodes were positive for malignancy.  No extranodal extension.  Patient has already completed neoadjuvant AC Taxol chemotherapy and does not require any adjuvant treatment at this time.  She required port access during surgery and therefore port was not taken out and she will reach out to Dr. Ferne Coe about getting the port out.  I will refer her to  Luna Fuse from OT to recommend exercises to prevent lymphedema in the future.  I will also refer her to radiation oncology for consideration of postmastectomy radiation.  Patient's tumor is ER/PR positive and therefore there would be role for  endocrine therapy at this time.  I recommend starting her on letrozole 1 tablet daily along with calcium and vitamin D.  Discussed risks and benefits of letrozole including all but not limited to hot flashes, fatigue, joint aches and possible worsening bone health.  Patient will start taking letrozole upon completion of radiation treatment.  I will tentatively see her back in 3 months with CBC with differential and CMP with a bone density scan prior.  Treatment will be given with a curative intent.   Visit Diagnosis 1. Malignant neoplasm of overlapping sites of left breast in female, estrogen receptor positive ([place])   2. Goals of care, counseling/discussion      Dr. [name], MD, MPH [facility] [phone] 06/04/2021 8:31 PM

## 2021-06-06 ENCOUNTER — Encounter: Payer: Self-pay | Admitting: Oncology

## 2021-06-06 MED ORDER — LETROZOLE 2.5 MG PO TABS: 2.5000 mg | ORAL_TABLET | Freq: Every day | ORAL | 3 refills | Status: DC

## 2021-06-10 ENCOUNTER — Inpatient Hospital Stay: Payer: 59 | Admitting: Occupational Therapy

## 2021-06-10 DIAGNOSIS — L905 Scar conditions and fibrosis of skin: Secondary | ICD-10-CM

## 2021-06-10 DIAGNOSIS — M25612 Stiffness of left shoulder, not elsewhere classified: Secondary | ICD-10-CM

## 2021-06-10 NOTE — Therapy (Signed)
[facility] [address] [place], [place], [place], [address] Phone: [phone]   Fax:  [phone]  Occupational Therapy Screen:  Patient Details  Name: Michelle Walker MRN: [id number] Date of Birth: [birth year] No data recorded  Encounter Date: 06/10/2021   OT End of Session - 06/10/21 1428     Visit Number 0             Past Medical History:  Diagnosis Date   Anemia    Arthritis    Breast cancer ([place]) 08/2015   1.5 mm invasive lobular cancer, 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 Wide excision/ RT, T1a,N0. ER/PR pos, Her 2.neg. Tamoxifen x 6 months, d/c secondary to vasomotor sympotms.    Headache    Hypertension    Personal history of radiation therapy 2016   RIGHT lumpectomy   Thyroid disease     Past Surgical History:  Procedure Laterality Date   ANTERIOR CRUCIATE LIGAMENT REPAIR Right    BREAST BIOPSY Right 07/17/2015   INVASIVE LOBULAR CARCINOMA, CLASSIC TYPE (1.5 MM), ARISING IN A    BREAST BIOPSY Left 09/19/2020   [place] bx 3 areas/ 1oc q clip/ 12 oc vision clip, axilla lymph node hydromark shape 3/ path pending   BREAST LUMPECTOMY Right 08/22/2015   BREAST LUMPECTOMY WITH SENTINEL LYMPH NODE BIOPSY Right 08/22/2015   Procedure: BREAST LUMPECTOMY WITH SENTINEL LYMPH NODE BX;  Surgeon: Christene Lye, MD;  Location: [facility];  Service: General;  Laterality: Right;   COLONOSCOPY W/ POLYPECTOMY  2014   COLONOSCOPY WITH PROPOFOL N/A 04/16/2019   Procedure: COLONOSCOPY WITH BIOPSIES;  Surgeon: Lucilla Lame, MD;  Location: [place];  Service: Endoscopy;  Laterality: N/A;   DILATION AND CURETTAGE OF UTERUS     20 years ago   [place]     POLYPECTOMY N/A 04/16/2019   Procedure: POLYPECTOMY;  Surgeon: Lucilla Lame, MD;  Location: [place];  Service: Endoscopy;  Laterality: N/A;   PORTACATH PLACEMENT Right 12/01/2020   Procedure: INSERTION PORT-A-CATH;  Surgeon: [name], MD;  Location:  [facility];  Service: General;  Laterality: Right;   TOTAL MASTECTOMY Left 05/06/2021   Procedure: modified radical mastectomy;  Surgeon: [name], MD;  Location: [facility];  Service: General;  Laterality: Left;  Provider requesting 1.5 hours / 90 minutes for procedure    There were no vitals filed for this visit.   Subjective Assessment - 06/10/21 1427     Subjective  I am seeing surgeon tomorrow for taking out port and will check on my L breast incision and this pocket under me arm- it is smaller in the morning but increase as the day goes- tender when my arm rub against the pocket    Currently in Pain? No/denies              DR Janese Banks Assessment and plan 06/02/21- Patient is a 60 y.o. 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  she is s/p 4 cycles of dose dense AC and 12 weekly cycles of taxol neoadjuvant chemotherapy.  Patient underwent left mastectomy without reconstruction and final pathology showed multifocal invasive mammary carcinoma [id number]   Discussed the results of final pathology with the patient in detail which showed multifocal invasive mammary carcinoma at least 3 in number with the largest focus that was 12 mm grade 2.  3 out of 12 lymph nodes  were positive for malignancy.  No extranodal extension.  Patient has already completed neoadjuvant AC Taxol chemotherapy and does not require any adjuvant treatment at this time.   She required port access during surgery and therefore port was not taken out and she will reach out to Dr. Ferne Coe about getting the port out.  I will refer her to Luna Fuse from OT to recommend exercises to prevent lymphedema in the future.  I will also refer her to radiation oncology for consideration of postmastectomy radiation.   Patient's tumor is ER/PR positive and therefore there would be role for  endocrine  therapy at this time.  I recommend starting her on letrozole 1 tablet daily along with calcium and vitamin D.  Discussed risks and benefits of letrozole including all but not limited to hot flashes, fatigue, joint aches and possible worsening bone health.  Patient will start taking letrozole upon completion of radiation treatment.  I will tentatively see her back in 3 months with CBC with differential and CMP with a bone density scan prior.  Treatment will be given with a curative intent.   OT SCREEN 06/10/21: Patient is concerned about a pocket of fat which is protruding at the lateral end of her mastectomy scar which rubs against her arm and makes it difficult for her to sleep.Refer to OT Pt assess this date - mastectomy scar healing well - but pt show increase stiffness and decrease AROM in L shoulder ABD and flexion more than ext and int rotation  Pt with pocket of swelling under arm - pt report night time down but increase during day and rub against her inner arm and gets tender  Pt ed on Lymphedema - symptoms and precautions- hand out provided   Pt to wear her bra over the area for compression and discuss with surgeon tomorrow if she can benefit from Unilateral post mastectomy jovipak breast pad to decrease pocket under her arm. 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 Pt to follow up with me again in 2 wks  Circumference of bilateral UE WNL - no symptoms of lymphedema in L UE - but monitoring L thoracic                                Patient will benefit from skilled therapeutic intervention in order to improve the following deficits and impairments:           Visit Diagnosis: Stiffness of left shoulder, not elsewhere classified  Scar condition and fibrosis of skin    Problem List Patient Active Problem List    Diagnosis Date Noted   S/P mastectomy, left 05/12/2021   Breast cancer metastasized to axillary lymph node, right ([place]) 05/06/2021   Prophylaxis for chemotherapy-induced neutropenia 01/16/2021   Genetic testing 11/26/2020   Goals of care, counseling/discussion 11/04/2020   Breast cancer, left ([place]) 11/04/2020   History of colonic polyps    Polyp of ascending colon    Depression 09/20/2017   DM2 (diabetes mellitus, type 2) ([place]) 09/20/2017   Gout 09/20/2017   HTN (hypertension) 09/20/2017   Migraine 09/20/2017   History of endometrial ablation 02/26/2016   Malignant neoplasm of upper-outer quadrant of left breast in female, estrogen receptor positive ([place]) 08/04/2015    Caspar Favila OTR/L,CLT 06/10/2021, 2:29  PM  [facility] [address] [place], [place], [place], [address] Phone: [phone]   Fax:  [phone]  Name: Michelle Walker MRN: [id number] Date of Birth: 1961-05-17

## 2021-06-11 ENCOUNTER — Other Ambulatory Visit: Payer: Self-pay

## 2021-06-11 ENCOUNTER — Ambulatory Visit: Payer: 59 | Admitting: Surgery

## 2021-06-11 ENCOUNTER — Telehealth: Payer: Self-pay | Admitting: *Deleted

## 2021-06-11 VITALS — BP 138/93 | HR 98 | Temp 98.6°F | Ht 66.0 in | Wt 185.0 lb

## 2021-06-11 DIAGNOSIS — Z95828 Presence of other vascular implants and grafts: Secondary | ICD-10-CM | POA: Diagnosis not present

## 2021-06-11 DIAGNOSIS — Z452 Encounter for adjustment and management of vascular access device: Secondary | ICD-10-CM

## 2021-06-11 NOTE — Patient Instructions (Addendum)
We have written you prescriptions for mastectomy bras and supplies and also for the post mastectomy Jovipack.   We have removed your Port today.  You may shower tomorrow.  May use ice pack as needed for comfort. You may take Tylenol or Ibuprofen as needed for any pain.  The glue will start to come off in 2 weeks.   Follow up here in 6 months.

## 2021-06-11 NOTE — Telephone Encounter (Signed)
Patient called and wanted to get another return to work note picked up. She stated that the one we gave her today will not work with her employer due to the restrictions. She needs another work note saying she can return to work on 06/17/21 with no restrictions. Please call and advise

## 2021-06-11 NOTE — Telephone Encounter (Signed)
New return to work note written. She will come by to pick this up.

## 2021-06-11 NOTE — Progress Notes (Signed)
SURGICAL OPERATIVE REPORT  DATE OF PROCEDURE: 06/11/2021  ANESTHESIA: Local  PRE-OPERATIVE DIAGNOSIS: Right subclavian central venous catheter with subcutaneous port, no longer needed (icd-10: [id number])  POST-OPERATIVE DIAGNOSIS:  Same  PROCEDURE(S):  1.) Removal of indwelling right subclavian central venous catheter with subcutaneous port (cpt: 36590)  INTRAOPERATIVE FINDINGS: Well-incorporated right subclavian central venous catheter with subcutaneous port without erythema or purulence  INTRAVENOUS FLUIDS: 0 mL crystalloid   ESTIMATED BLOOD LOSS: Minimal (< 5 mL)  SPECIMEN/EXPLANT:   Port -a catheter.   DRAINS: none  COMPLICATIONS: None apparent  CONDITION AT END OF PROCEDURE: Hemodynamically stable and awake  DISPOSITION OF PATIENT: Home.  INDICATIONS FOR PROCEDURE:  Patient is s/p complete utilization of a right subclavian central venous catheter with indwelling subcutaneous port presents for evaluation and removal of her no longer needed port. Patient reports the port was never infected and always functioned properly, but since it's reportedly no longer needed, and  wishes to have it removed. All risks, benefits, and alternatives to above procedure were discussed with the patient, all of patient's questions were answered to his expressed satisfaction, and informed consent was obtained and documented.  DETAILS OF PROCEDURE: Patient was brought to the procedure suite and appropriately identified. In supine position, operative site was prepped and draped in the usual sterile fashion, and following a brief time out, lidocaine was injected in the subcutaneous tissue overlying the port, a 2 cm incision was made through the previous scar to accommodate the port. A combination of blunt dissection, sharp dissection  were used to free the port and attached catheter from its surrounding capsule and tissues. The port was then able to be removed from its subcutaneous pocket, the catheter was  confirmed to slide freely, and the catheter was removed with immediate focal pressure applied to the venous insertion site for hemostasis. Hemostasis was then confirmed, the subcutaneous pocket was evaluated to ensure all/any previous used sutures were removed, and the wound was then closed in layers, using 3-0 Vicryl buried interrupted suture to re-approximate dermis. Additional local anesthetic was injected subcutaneously along the length of the incision, and a running subcuticular 4-0 Monocryl suture was used to re-approximate epidermis. Skin was then cleaned, dried, and sterile Dermabond was applied and allowed to dry.  [name], M.D., [facility]  06/11/2021 ; 10:31 AM

## 2021-06-17 ENCOUNTER — Ambulatory Visit
Admission: RE | Admit: 2021-06-17 | Discharge: 2021-06-17 | Disposition: A | Discharge status: Home / Self Care | Payer: 59 | Source: Ambulatory Visit | Attending: Radiation Oncology | Admitting: Radiation Oncology

## 2021-06-17 ENCOUNTER — Encounter: Payer: Self-pay | Admitting: Radiation Oncology

## 2021-06-17 ENCOUNTER — Other Ambulatory Visit: Payer: Self-pay

## 2021-06-17 VITALS — BP 142/95 | HR 116 | Temp 97.0°F | Resp 16 | Wt 185.5 lb

## 2021-06-17 DIAGNOSIS — C50812 Malignant neoplasm of overlapping sites of left female breast: Secondary | ICD-10-CM

## 2021-06-17 NOTE — Consult Note (Signed)
NEW PATIENT EVALUATION  Name: Michelle Walker  MRN: [id number]  Date:   06/17/2021     DOB: 22-Dec-1960   This 60 y.o. 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  REFERRING PHYSICIAN: Casilda Carls, MD  CHIEF COMPLAINT:  Chief Complaint  Patient presents with   Breast Cancer    DIAGNOSIS: The encounter diagnosis was Malignant neoplasm of overlapping sites of left breast in female, estrogen receptor positive ([place]).   PREVIOUS INVESTIGATIONS:  Mammogram ultrasound reviewed breast MRI scan and bone scan reviewed Pathology report reviewed Clinical notes reviewed  HPI: Patient is a 60 year old female well-known to department having been treated back in 2016 status post lumpectomy of her right breast for invasive lobular carcinoma who received adjuvant radiation therapy.  She recently presented with diagnostic bilateral mammograms showing 2.2 x 1.9 x 1.9 mass at 12 o'clock position of the left breast 1 cm from the nipple.  She was found to have 5 other smaller breast masses.  She also had a solitary left axillary lymph node with cortical thickening.  Breast MRI confirmed multiple enhancing lesions of the left breast spanning a total of approximately 10 x 5 x 6.4 cm consistent with multifocal multicentric left breast cancer.  She also had a single mildly enlarged Lex axis left axillary lymph node.  Bone scan was negative.  She had biopsy of multiple sites of her left breast as well as axillary lymph node all positive for metastatic breast cancer.  Tumor was ER/PR positive HER2/neu not overexpressed.  Right breast has remained normal with no imaging to suggest recurrent disease.  MammaPrint came back with 12% benefit for chemotherapy.  She underwent neoadjuvant  chemotherapy with DD AC/Taxol completed in June 2022.  She is underwent a left modified radical mastectomy showing residual 1.2 cm of invasive mammary carcinoma overall grade 2.  There is multifocal multiple foci of invasive carcinoma at least 3 foci.  Margins were clear at by at least 5 cm..  3 of 12 lymph nodes were positive for metastatic disease with no extranodal extension.  She has done well postoperatively.  She is now seen by radiation oncology for consideration of postmastectomy radiation.  She specifically denies any chest wall pain or tenderness bone pain or cough.  PLANNED TREATMENT REGIMEN: Left chest wall and peripheral lymphatic radiation  PAST MEDICAL HISTORY:  has a past medical history of Anemia, Arthritis, Breast cancer ([place]) (08/2015), Headache, Hypertension, Personal history of radiation therapy (2016), and Thyroid disease.    PAST SURGICAL HISTORY:  Past Surgical History:  Procedure Laterality Date   ANTERIOR CRUCIATE LIGAMENT REPAIR Right    BREAST BIOPSY Right 07/17/2015   INVASIVE LOBULAR CARCINOMA, CLASSIC TYPE (1.5 MM), ARISING IN A    BREAST BIOPSY Left 09/19/2020   [place] bx 3 areas/ 1oc q clip/ 12 oc vision clip, axilla lymph node hydromark shape 3/ path pending   BREAST LUMPECTOMY Right 08/22/2015   BREAST LUMPECTOMY WITH SENTINEL LYMPH NODE BIOPSY Right 08/22/2015   Procedure: BREAST LUMPECTOMY WITH SENTINEL LYMPH NODE BX;  Surgeon: Christene Lye, MD;  Location: [facility];  Service: General;  Laterality: Right;   COLONOSCOPY W/ POLYPECTOMY  2014   COLONOSCOPY WITH PROPOFOL N/A 04/16/2019   Procedure: COLONOSCOPY WITH BIOPSIES;  Surgeon: Lucilla Lame, MD;  [facility];  Service: Endoscopy;  Laterality: N/A;   DILATION AND CURETTAGE OF UTERUS     20 years ago   [place]     POLYPECTOMY N/A 04/16/2019   Procedure: POLYPECTOMY;  Surgeon: Lucilla Lame, MD;  Location: [place];  Service: Endoscopy;  Laterality: N/A;   PORTACATH  PLACEMENT Right 12/01/2020   Procedure: INSERTION PORT-A-CATH;  Surgeon: [name], MD;  Location: [facility];  Service: General;  Laterality: Right;   TOTAL MASTECTOMY Left 05/06/2021   Procedure: modified radical mastectomy;  Surgeon: [name], MD;  Location: [facility];  Service: General;  Laterality: Left;  Provider requesting 1.5 hours / 90 minutes for procedure    FAMILY HISTORY: family history includes Cancer in her sister; Diabetes in her brother and sister; Stroke in her mother and sister.  SOCIAL HISTORY:  reports that she has been smoking cigarettes. She has a 30.00 pack-year smoking history. She has never used smokeless tobacco. She reports current alcohol use of about 2.0 standard drinks per week. She reports that she does not use drugs.  ALLERGIES: Triamcinolone and Tape  MEDICATIONS:  Current Outpatient Medications  Medication Sig Dispense Refill   allopurinol (ZYLOPRIM) 100 MG tablet Take 100 mg by mouth at bedtime.     amLODipine (NORVASC) 10 MG tablet Take 10 mg by mouth at bedtime.   0   Calcium Carb-Cholecalciferol (CALCIUM 600+D3 PO) Take 1 tablet by mouth at bedtime.     letrozole (FEMARA) 2.5 MG tablet Take 1 tablet (2.5 mg total) by mouth daily. 30 tablet 3   losartan (COZAAR) 100 MG tablet Take 100 mg by mouth at bedtime.   0   metoprolol succinate (TOPROL-XL) 25 MG 24 hr tablet Take 50 mg by mouth at bedtime.  0   potassium chloride SA (KLOR-CON) 20 MEQ tablet TAKE 1 TABLET(20 MEQ) BY MOUTH DAILY 30 tablet 0   PROVENTIL HFA 108 (90 Base) MCG/ACT inhaler Inhale 2 puffs into the lungs every 6 (six) hours as needed for shortness of breath or wheezing.     No current facility-administered medications for this encounter.   Facility-Administered Medications Ordered in Other Encounters  Medication Dose Route Frequency Provider Last Rate Last Admin   sodium chloride flush (NS) 0.9 % injection 10 mL  10 mL Intravenous PRN Sindy Guadeloupe, MD   10 mL at 03/27/21  0813    ECOG PERFORMANCE STATUS:  0 - Asymptomatic  REVIEW OF SYSTEMS: Patient denies any weight loss, fatigue, weakness, fever, chills or night sweats. Patient denies any loss of vision, blurred vision. Patient denies any ringing  of the ears or hearing loss. No irregular heartbeat. Patient denies heart murmur or history of fainting. Patient denies any chest pain or pain radiating to her upper extremities. Patient denies any shortness of breath, difficulty breathing at night, cough or hemoptysis. Patient denies any swelling in the lower legs. Patient denies any nausea vomiting, vomiting of blood, or coffee ground material in the vomitus. Patient denies any stomach pain. Patient states has had normal bowel movements no significant constipation or diarrhea. Patient denies any dysuria, hematuria or significant nocturia. Patient denies any problems walking, swelling in the joints or loss of balance. Patient denies any skin changes, loss of hair or loss of weight. Patient denies any excessive worrying or anxiety or significant depression. Patient denies any problems with insomnia. Patient denies excessive thirst, polyuria, polydipsia. Patient denies any swollen glands, patient denies easy bruising or easy bleeding. Patient denies any recent  infections, allergies or URI. Patient "s visual fields have not changed significantly in recent time.   PHYSICAL EXAM: BP (!) 142/95   Pulse (!) 116   Temp (!) 97 F (36.1 C) (Tympanic)   Resp 16   Wt 185 lb 8 oz (84.1 kg)   BMI 29.94 kg/m  Patient is status post left modified radical mastectomy incision is healed well no evidence of mass or nodularity in her left chest wall is noted no axillary or supraclavicular adenopathy is identified.  Right breast is free of dominant mass.  No evidence of lymphedema in her left upper extremity is noted.  Well-developed well-nourished patient in NAD. HEENT reveals PERLA, EOMI, discs not visualized.  Oral cavity is clear. No oral  mucosal lesions are identified. Neck is clear without evidence of cervical or supraclavicular adenopathy. Lungs are clear to A&P. Cardiac examination is essentially unremarkable with regular rate and rhythm without murmur rub or thrill. Abdomen is benign with no organomegaly or masses noted. Motor sensory and DTR levels are equal and symmetric in the upper and lower extremities. Cranial nerves II through XII are grossly intact. Proprioception is intact. No peripheral adenopathy or edema is identified. No motor or sensory levels are noted. Crude visual fields are within normal range.  LABORATORY DATA: Pathology report reviewed    RADIOLOGY RESULTS: Breast MRI scans bone scan mammogram and ultrasounds all reviewed compatible with above-stated findings   IMPRESSION: Stage II invasive mammary carcinoma of the left breast status post neoadjuvant chemotherapy and left axillary lymph node dissection in 60 year old female with prior history of right breast cancer  PLAN: Based on the total aggregate dimensions close to 10 cm of her left breast cancer plus multiple lymph node involved even after neoadjuvant chemotherapy would recommend postmastectomy radiation based on poor prognostic factors.  We will plan on delivering 50.4 Gray over 5 weeks to her left chest wall and peripheral lymphatics.  Would also boost her scar another 1000 cGy using electron beam.  Risks and benefits of treatment including skin reaction fatigue alteration of blood counts possible inclusion of superficial lung possible chance of lymphedema of her left upper extremity all were described in detail to the patient.  She seems to comprehend my treatment plan well.  I have personally set up and ordered CT simulation for later this week.  I would like to take this opportunity to thank you for allowing me to participate in the care of your patient.Noreene Filbert, MD

## 2021-06-18 ENCOUNTER — Ambulatory Visit
Admission: RE | Admit: 2021-06-18 | Discharge: 2021-06-18 | Disposition: A | Discharge status: Home / Self Care | Payer: 59 | Source: Ambulatory Visit | Attending: Radiation Oncology | Admitting: Radiation Oncology

## 2021-06-18 DIAGNOSIS — C50411 Malignant neoplasm of upper-outer quadrant of right female breast: Secondary | ICD-10-CM | POA: Insufficient documentation

## 2021-06-18 DIAGNOSIS — F17210 Nicotine dependence, cigarettes, uncomplicated: Secondary | ICD-10-CM | POA: Insufficient documentation

## 2021-06-18 DIAGNOSIS — I10 Essential (primary) hypertension: Secondary | ICD-10-CM | POA: Insufficient documentation

## 2021-06-18 DIAGNOSIS — Z853 Personal history of malignant neoplasm of breast: Secondary | ICD-10-CM | POA: Insufficient documentation

## 2021-06-18 DIAGNOSIS — C773 Secondary and unspecified malignant neoplasm of axilla and upper limb lymph nodes: Secondary | ICD-10-CM | POA: Insufficient documentation

## 2021-06-18 DIAGNOSIS — Z79811 Long term (current) use of aromatase inhibitors: Secondary | ICD-10-CM | POA: Insufficient documentation

## 2021-06-18 DIAGNOSIS — Z510 Encounter for antineoplastic radiation therapy: Secondary | ICD-10-CM | POA: Insufficient documentation

## 2021-06-18 DIAGNOSIS — Z79899 Other long term (current) drug therapy: Secondary | ICD-10-CM | POA: Insufficient documentation

## 2021-06-18 DIAGNOSIS — Z170 Estrogen receptor positive status [ER+]: Secondary | ICD-10-CM | POA: Insufficient documentation

## 2021-06-18 DIAGNOSIS — Z923 Personal history of irradiation: Secondary | ICD-10-CM | POA: Insufficient documentation

## 2021-06-19 ENCOUNTER — Other Ambulatory Visit: Payer: Self-pay | Admitting: *Deleted

## 2021-06-19 DIAGNOSIS — C50812 Malignant neoplasm of overlapping sites of left female breast: Secondary | ICD-10-CM

## 2021-06-19 DIAGNOSIS — Z170 Estrogen receptor positive status [ER+]: Secondary | ICD-10-CM

## 2021-06-23 DIAGNOSIS — Z510 Encounter for antineoplastic radiation therapy: Secondary | ICD-10-CM | POA: Diagnosis not present

## 2021-06-24 ENCOUNTER — Inpatient Hospital Stay: Payer: 59 | Admitting: Occupational Therapy

## 2021-06-24 ENCOUNTER — Ambulatory Visit: Admission: RE | Admit: 2021-06-24 | Payer: 59 | Source: Ambulatory Visit

## 2021-06-24 DIAGNOSIS — Z510 Encounter for antineoplastic radiation therapy: Secondary | ICD-10-CM | POA: Diagnosis not present

## 2021-06-24 DIAGNOSIS — M25612 Stiffness of left shoulder, not elsewhere classified: Secondary | ICD-10-CM

## 2021-06-24 NOTE — Therapy (Signed)
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 Wide excision/ RT, T1a,N0. ER/PR pos, Her 2.neg. Tamoxifen x 6 months, d/c secondary to vasomotor sympotms.    Headache    Hypertension    Personal history of radiation therapy 2016   RIGHT lumpectomy   Thyroid disease     Past Surgical History:  Procedure Laterality Date   ANTERIOR CRUCIATE LIGAMENT REPAIR Right    BREAST BIOPSY Right 07/17/2015   INVASIVE LOBULAR CARCINOMA, CLASSIC TYPE (1.5 MM), ARISING IN A    BREAST BIOPSY Left 09/19/2020   [place] bx 3 areas/ 1oc q clip/ 12 oc vision clip, axilla lymph node hydromark shape 3/ path pending   BREAST LUMPECTOMY Right 08/22/2015   BREAST LUMPECTOMY WITH SENTINEL LYMPH NODE BIOPSY Right 08/22/2015   Procedure: BREAST LUMPECTOMY WITH SENTINEL LYMPH NODE BX;  Surgeon: Christene Lye, MD;  Location: [facility];  Service: General;  Laterality: Right;   COLONOSCOPY W/ POLYPECTOMY  2014   COLONOSCOPY WITH PROPOFOL N/A 04/16/2019   Procedure: COLONOSCOPY WITH BIOPSIES;  Surgeon: Lucilla Lame, MD;  Location: [place];  Service: Endoscopy;  Laterality: N/A;   DILATION AND CURETTAGE OF UTERUS     20 years ago   [place]     POLYPECTOMY N/A 04/16/2019   Procedure: POLYPECTOMY;  Surgeon: Lucilla Lame, MD;  Location: [place];  Service: Endoscopy;  Laterality: N/A;   PORTACATH PLACEMENT Right 12/01/2020   Procedure: INSERTION PORT-A-CATH;  Surgeon: [name], MD;  Location:  [facility];  Service: General;  Laterality: Right;   TOTAL MASTECTOMY Left 05/06/2021   Procedure: modified radical mastectomy;  Surgeon: [name], MD;  Location: [facility];  Service: General;  Laterality: Left;  Provider requesting 1.5 hours / 90 minutes for procedure    There were no vitals filed for this visit.   Subjective Assessment - 06/24/21 1921     Subjective  Started to work and it is alot on my feet- my neuropathy feels worse - are there anything to help -and then radiaiton I am doing now - pocket still under my arm - did not pick up my jovipak yet    Currently in Pain? No/denies                 LYMPHEDEMA/ONCOLOGY QUESTIONNAIRE - 06/24/21 0001       Right Upper Extremity Lymphedema   15 cm Proximal to Olecranon Process 30.5 cm    10 cm Proximal to Olecranon Process 28.5 cm    Olecranon Process 26 cm    15 cm Proximal to Ulnar Styloid Process 26 cm    Just Proximal to Ulnar Styloid Process 17 cm      Left Upper Extremity Lymphedema   15 cm Proximal to Olecranon Process 30 cm    10 cm Proximal to Olecranon Process 28.5 cm    Olecranon Process 26 cm    15 cm Proximal to  Ulnar Styloid Process 25 cm    Just Proximal to Ulnar Styloid Process 17 cm               DR Janese Banks Assessment and plan 06/02/21- Patient is a 60 y.o. 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  she is s/p 4 cycles of dose dense AC and 12 weekly cycles of taxol neoadjuvant chemotherapy.  Patient underwent left mastectomy without reconstruction and final pathology showed multifocal invasive mammary carcinoma [id number]   Discussed the results of final pathology with the patient in detail which showed multifocal invasive mammary carcinoma at least 3 in number with the largest focus that was 12 mm grade 2.  3 out of 12 lymph nodes were positive for malignancy.  No  extranodal extension.  Patient has already completed neoadjuvant AC Taxol chemotherapy and does not require any adjuvant treatment at this time.   She required port access during surgery and therefore port was not taken out and she will reach out to Dr. Ferne Coe about getting the port out.  I will refer her to Luna Fuse from OT to recommend exercises to prevent lymphedema in the future.  I will also refer her to radiation oncology for consideration of postmastectomy radiation.   Patient's tumor is ER/PR positive and therefore there would be role for  endocrine therapy at this time.  I recommend starting her on letrozole 1 tablet daily along with calcium and vitamin D.  Discussed risks and benefits of letrozole including all but not limited to hot flashes, fatigue, joint aches and possible worsening bone health.  Patient will start taking letrozole upon completion of radiation treatment.  I will tentatively see her back in 3 months with CBC with differential and CMP with a bone density scan prior.  Treatment will be given with a curative intent.   OT SCREEN 06/10/21: Patient is concerned about a pocket of fat which is protruding at the lateral end of her mastectomy scar which rubs against her arm and makes it difficult for her to sleep.Refer to OT Pt assess this date - mastectomy scar healing well - but pt show increase stiffness and decrease AROM in L shoulder ABD and flexion more than ext and int rotation  Pt with pocket of swelling under arm - pt report night time down but increase during day and rub against her inner arm and gets tender  Pt ed on Lymphedema - symptoms and precautions- hand out provided   Pt to wear her bra over the area for compression and discuss with surgeon tomorrow if she can benefit from Unilateral post mastectomy jovipak breast pad to decrease pocket under her arm. 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  Ask if anything that can help Breast Cancer navigator contacted -and provided info on chiropractor and refer to get some acupuncture - they will contact her to setup  Patient  report that the pocked of  swelling under her arm is staying about the same - she is wearing bra for compression and will pick up Unilateral post mastectomy jovipak breast pad today.  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 Circumference of bilateral UE WNL - no symptoms of lymphedema in L UE - but monitoring L thoracic while starting radiation                                                Visit Diagnosis: Stiffness of left shoulder, not elsewhere classified    Problem List Patient Active Problem List   Diagnosis Date Noted   Port-A-Cath in place 06/11/2021   S/P mastectomy, left 05/12/2021   Breast cancer metastasized to axillary lymph node, right ([place]) 05/06/2021   Prophylaxis for chemotherapy-induced neutropenia 01/16/2021   Genetic testing 11/26/2020   Goals of care, counseling/discussion 11/04/2020   Breast cancer, left ([place]) 11/04/2020   History of colonic  polyps    Polyp of ascending colon    Depression 09/20/2017   DM2 (diabetes mellitus, type 2) ([place]) 09/20/2017   Gout 09/20/2017   HTN (hypertension) 09/20/2017   Migraine 09/20/2017   History of endometrial ablation 02/26/2016   Malignant neoplasm of upper-outer quadrant of left breast in female, estrogen receptor positive ([place]) 08/04/2015    Michelle Walker OTR/L,CLT 06/24/2021, 7:41 PM  [facility] [address] [place], [place], [address] Phone: [phone]   Fax:  [phone]  Name: HAILYNN SLOVACEK MRN: [id number] Date of Birth: 05-27-1961

## 2021-06-25 ENCOUNTER — Ambulatory Visit
Admission: RE | Admit: 2021-06-25 | Discharge: 2021-06-25 | Disposition: A | Discharge status: Home / Self Care | Payer: 59 | Source: Ambulatory Visit | Attending: Radiation Oncology | Admitting: Radiation Oncology

## 2021-06-25 DIAGNOSIS — Z510 Encounter for antineoplastic radiation therapy: Secondary | ICD-10-CM | POA: Diagnosis not present

## 2021-06-26 ENCOUNTER — Ambulatory Visit
Admission: RE | Admit: 2021-06-26 | Discharge: 2021-06-26 | Disposition: A | Discharge status: Home / Self Care | Payer: 59 | Source: Ambulatory Visit | Attending: Radiation Oncology | Admitting: Radiation Oncology

## 2021-06-26 DIAGNOSIS — Z510 Encounter for antineoplastic radiation therapy: Secondary | ICD-10-CM | POA: Diagnosis not present

## 2021-06-29 ENCOUNTER — Ambulatory Visit
Admission: RE | Admit: 2021-06-29 | Discharge: 2021-06-29 | Disposition: A | Discharge status: Home / Self Care | Payer: 59 | Source: Ambulatory Visit | Attending: Radiation Oncology | Admitting: Radiation Oncology

## 2021-06-29 DIAGNOSIS — Z510 Encounter for antineoplastic radiation therapy: Secondary | ICD-10-CM | POA: Diagnosis not present

## 2021-06-30 ENCOUNTER — Ambulatory Visit
Admission: RE | Admit: 2021-06-30 | Discharge: 2021-06-30 | Disposition: A | Discharge status: Home / Self Care | Payer: 59 | Source: Ambulatory Visit | Attending: Radiation Oncology | Admitting: Radiation Oncology

## 2021-06-30 DIAGNOSIS — Z510 Encounter for antineoplastic radiation therapy: Secondary | ICD-10-CM | POA: Diagnosis not present

## 2021-07-01 ENCOUNTER — Other Ambulatory Visit: Payer: Self-pay

## 2021-07-01 ENCOUNTER — Inpatient Hospital Stay: Payer: 59 | Admitting: Occupational Therapy

## 2021-07-01 ENCOUNTER — Ambulatory Visit
Admission: RE | Admit: 2021-07-01 | Discharge: 2021-07-01 | Disposition: A | Discharge status: Home / Self Care | Payer: 59 | Source: Ambulatory Visit | Attending: Radiation Oncology | Admitting: Radiation Oncology

## 2021-07-01 DIAGNOSIS — L905 Scar conditions and fibrosis of skin: Secondary | ICD-10-CM

## 2021-07-01 DIAGNOSIS — M25612 Stiffness of left shoulder, not elsewhere classified: Secondary | ICD-10-CM

## 2021-07-01 DIAGNOSIS — Z510 Encounter for antineoplastic radiation therapy: Secondary | ICD-10-CM | POA: Diagnosis not present

## 2021-07-01 NOTE — Therapy (Signed)
[facility] [address] 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 Wide excision/ RT, T1a,N0. ER/PR pos, Her 2.neg. Tamoxifen x 6 months, d/c secondary to vasomotor sympotms.    Headache    Hypertension    Personal history of radiation therapy 2016   RIGHT lumpectomy   Thyroid disease     Past Surgical History:  Procedure Laterality Date   ANTERIOR CRUCIATE LIGAMENT REPAIR Right    BREAST BIOPSY Right 07/17/2015   INVASIVE LOBULAR CARCINOMA, CLASSIC TYPE (1.5 MM), ARISING IN A    BREAST BIOPSY Left 09/19/2020   [place] bx 3 areas/ 1oc q clip/ 12 oc vision clip, axilla lymph node hydromark shape 3/ path pending   BREAST LUMPECTOMY Right 08/22/2015   BREAST LUMPECTOMY WITH SENTINEL LYMPH NODE BIOPSY Right 08/22/2015   Procedure: BREAST LUMPECTOMY WITH SENTINEL LYMPH NODE BX;  Surgeon: Christene Lye, MD;  Location: [facility];  Service: General;  Laterality: Right;   COLONOSCOPY W/ POLYPECTOMY  2014   COLONOSCOPY WITH PROPOFOL N/A 04/16/2019   Procedure: COLONOSCOPY WITH BIOPSIES;  Surgeon: Lucilla Lame, MD;  Location: [facility];  Service: Endoscopy;  Laterality: N/A;   DILATION AND CURETTAGE OF UTERUS     20 years ago   [place]     POLYPECTOMY N/A 04/16/2019   Procedure: POLYPECTOMY;  Surgeon: Lucilla Lame, MD;  Location: [place] [facility];  Service: Endoscopy;  Laterality: N/A;   PORTACATH PLACEMENT Right 12/01/2020   Procedure: INSERTION PORT-A-CATH;  Surgeon: [name], MD;  Location:  [facility];  Service: General;  Laterality: Right;   TOTAL MASTECTOMY Left 05/06/2021   Procedure: modified radical mastectomy;  Surgeon: [name], MD;  Location: [facility];  Service: General;  Laterality: Left;  Provider requesting 1.5 hours / 90 minutes for procedure    There were no vitals filed for this visit.   Subjective Assessment - 07/01/21 0855     Subjective  Doing okay - done radiation about 7 days- that cord still in my armpit - and stretches over head pull a lot - work wise doing a lot of repetition but weight wise about 2-3lbs    Currently in Pain? No/denies                       DR Janese Banks Assessment and plan 06/02/21- Patient is a 60 y.o. 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  she is s/p 4 cycles of dose dense AC and 12 weekly cycles of taxol neoadjuvant chemotherapy.  Patient underwent left mastectomy without reconstruction and final pathology showed multifocal invasive mammary carcinoma [id number]   Discussed the results of final pathology with the patient in detail which showed multifocal invasive mammary carcinoma at least 3 in number with the largest focus that was 12 mm grade 2.  3 out of 12 lymph  nodes were positive for malignancy.  No extranodal extension.  Patient has already completed neoadjuvant AC Taxol chemotherapy and does not require any adjuvant treatment at this time.   She required port access during surgery and therefore port was not taken out and she will reach out to Dr. Ferne Coe about getting the port out.  I will refer her to Luna Fuse from OT to recommend exercises to prevent lymphedema in the future.  I will also refer her to radiation oncology for consideration of postmastectomy radiation.   Patient's tumor is ER/PR positive and therefore there would be role for  endocrine therapy at this time.  I  recommend starting her on letrozole 1 tablet daily along with calcium and vitamin D.  Discussed risks and benefits of letrozole including all but not limited to hot flashes, fatigue, joint aches and possible worsening bone health.  Patient will start taking letrozole upon completion of radiation treatment.  I will tentatively see her back in 3 months with CBC with differential and CMP with a bone density scan prior.  Treatment will be given with a curative intent.   OT SCREEN 06/10/21: Patient is concerned about a pocket of fat which is protruding at the lateral end of her mastectomy scar which rubs against her arm and makes it difficult for her to sleep.Refer to OT Pt assess this date - mastectomy scar healing well - but pt show increase stiffness and decrease AROM in L shoulder ABD and flexion more than ext and int rotation  Pt with pocket of swelling under arm - pt report night time down but increase during day and rub against her inner arm and gets tender  Pt ed on Lymphedema - symptoms and precautions- hand out provided   Pt to wear her bra over the area for compression and discuss with surgeon tomorrow if she can benefit from Unilateral post mastectomy jovipak breast pad to decrease pocket under her arm. 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  Ask if anything that can help Breast Cancer navigator contacted -and provided info on chiropractor and refer to get some acupuncture - they will contact her to setup  Patient  report that the pocked of swelling under her arm is staying about the same - she  is wearing bra for compression and will pick up Unilateral post mastectomy jovipak breast pad today.   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  Ask if anything that can help Breast Cancer navigator contacted  last time-and provided info on chiropractor and refer to get some acupuncture - she report that they did contact her and she has appt tomorrow.   Patient  report that the pocked of swelling under her arm is staying about the same - she is wearing bra for compression and did pick up Unilateral post mastectomy jovipak breast pad last week and wearing it at night mostly.   Pt ed on Lymphedema - symptoms and precautions- hand out provided with first appt  Pt to  cont to wear her bra over the area for compression and Unilateral post mastectomy jovipak breast pad to decrease pocket under her arm. But with radiation need to stop wearing it if her skin gets to sensitive. Pt to use camisole instead for compression bra at night .   Pt cont to show decrease shoulder  ABD 125 more than flexion 140-  cont to show cording in axilla and interior upper arm -  improved some last time and was able to get one pop today - done only about 2 min of soft tissue-  did not want to irritate or cause pain - pain was less with techniques today compare to last week cording now more in axilla but still going some into interior upper arm. 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 Circumference of bilateral UE increase this date on L compare to last week - will cont to monitor - pt to return next week again                                     :           Visit Diagnosis: Stiffness of left shoulder, not elsewhere classified  Scar condition and fibrosis of skin    Problem List Patient Active Problem List   Diagnosis Date Noted   Port-A-Cath in place 06/11/2021   S/P mastectomy, left 05/12/2021   Breast cancer metastasized to axillary lymph node, right ([place]) 05/06/2021   Prophylaxis for chemotherapy-induced neutropenia 01/16/2021   Genetic testing 11/26/2020   Goals of care, counseling/discussion 11/04/2020   Breast cancer, left ([place]) 11/04/2020   History of colonic polyps    Polyp of ascending colon    Depression 09/20/2017   DM2 (diabetes mellitus, type 2) ([place]) 09/20/2017   Gout 09/20/2017   HTN (hypertension) 09/20/2017   Migraine 09/20/2017   History of endometrial ablation 02/26/2016   Malignant neoplasm of upper-outer quadrant of left breast in female, estrogen receptor positive ([place]) 08/04/2015    Rosalyn Gess OTR/L,CLT 07/01/2021, 8:56 AM  [facility] [address], [place], [place], [address] Phone: [phone]   Fax:  [phone]  Name: Michelle Walker MRN: [id number] Date of Birth: Aug 16, 1961

## 2021-07-02 ENCOUNTER — Ambulatory Visit
Admission: RE | Admit: 2021-07-02 | Discharge: 2021-07-02 | Disposition: A | Discharge status: Home / Self Care | Payer: 59 | Source: Ambulatory Visit | Attending: Radiation Oncology | Admitting: Radiation Oncology

## 2021-07-02 DIAGNOSIS — Z170 Estrogen receptor positive status [ER+]: Secondary | ICD-10-CM | POA: Insufficient documentation

## 2021-07-02 DIAGNOSIS — Z510 Encounter for antineoplastic radiation therapy: Secondary | ICD-10-CM | POA: Insufficient documentation

## 2021-07-02 DIAGNOSIS — C773 Secondary and unspecified malignant neoplasm of axilla and upper limb lymph nodes: Secondary | ICD-10-CM | POA: Insufficient documentation

## 2021-07-02 DIAGNOSIS — Z5111 Encounter for antineoplastic chemotherapy: Secondary | ICD-10-CM | POA: Diagnosis present

## 2021-07-02 DIAGNOSIS — Z853 Personal history of malignant neoplasm of breast: Secondary | ICD-10-CM | POA: Insufficient documentation

## 2021-07-02 DIAGNOSIS — Z79899 Other long term (current) drug therapy: Secondary | ICD-10-CM | POA: Insufficient documentation

## 2021-07-02 DIAGNOSIS — I10 Essential (primary) hypertension: Secondary | ICD-10-CM | POA: Insufficient documentation

## 2021-07-02 DIAGNOSIS — C50411 Malignant neoplasm of upper-outer quadrant of right female breast: Secondary | ICD-10-CM | POA: Insufficient documentation

## 2021-07-02 DIAGNOSIS — C50812 Malignant neoplasm of overlapping sites of left female breast: Secondary | ICD-10-CM | POA: Insufficient documentation

## 2021-07-02 DIAGNOSIS — Z79811 Long term (current) use of aromatase inhibitors: Secondary | ICD-10-CM | POA: Insufficient documentation

## 2021-07-02 DIAGNOSIS — Z923 Personal history of irradiation: Secondary | ICD-10-CM | POA: Insufficient documentation

## 2021-07-02 DIAGNOSIS — F17210 Nicotine dependence, cigarettes, uncomplicated: Secondary | ICD-10-CM | POA: Insufficient documentation

## 2021-07-03 ENCOUNTER — Ambulatory Visit
Admission: RE | Admit: 2021-07-03 | Discharge: 2021-07-03 | Disposition: A | Discharge status: Home / Self Care | Payer: 59 | Source: Ambulatory Visit | Attending: Radiation Oncology | Admitting: Radiation Oncology

## 2021-07-03 DIAGNOSIS — Z510 Encounter for antineoplastic radiation therapy: Secondary | ICD-10-CM | POA: Diagnosis not present

## 2021-07-07 ENCOUNTER — Other Ambulatory Visit: Payer: 59

## 2021-07-07 ENCOUNTER — Ambulatory Visit
Admission: RE | Admit: 2021-07-07 | Discharge: 2021-07-07 | Disposition: A | Discharge status: Home / Self Care | Payer: 59 | Source: Ambulatory Visit | Attending: Radiation Oncology | Admitting: Radiation Oncology

## 2021-07-07 DIAGNOSIS — Z510 Encounter for antineoplastic radiation therapy: Secondary | ICD-10-CM | POA: Diagnosis not present

## 2021-07-08 ENCOUNTER — Ambulatory Visit
Admission: RE | Admit: 2021-07-08 | Discharge: 2021-07-08 | Disposition: A | Discharge status: Home / Self Care | Payer: 59 | Source: Ambulatory Visit | Attending: Radiation Oncology | Admitting: Radiation Oncology

## 2021-07-08 ENCOUNTER — Inpatient Hospital Stay: Payer: 59 | Attending: Oncology | Admitting: Occupational Therapy

## 2021-07-08 DIAGNOSIS — M25612 Stiffness of left shoulder, not elsewhere classified: Secondary | ICD-10-CM

## 2021-07-08 DIAGNOSIS — L905 Scar conditions and fibrosis of skin: Secondary | ICD-10-CM

## 2021-07-08 DIAGNOSIS — Z510 Encounter for antineoplastic radiation therapy: Secondary | ICD-10-CM | POA: Diagnosis not present

## 2021-07-08 NOTE — Therapy (Signed)
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 Wide excision/ RT, T1a,N0. ER/PR pos, Her 2.neg. Tamoxifen x 6 months, d/c secondary to vasomotor sympotms.    Headache    Hypertension    Personal history of radiation therapy 2016   RIGHT lumpectomy   Thyroid disease     Past Surgical History:  Procedure Laterality Date   ANTERIOR CRUCIATE LIGAMENT REPAIR Right    BREAST BIOPSY Right 07/17/2015   INVASIVE LOBULAR CARCINOMA, CLASSIC TYPE (1.5 MM), ARISING IN A    BREAST BIOPSY Left 09/19/2020   [place] bx 3 areas/ 1oc q clip/ 12 oc vision clip, axilla lymph node hydromark shape 3/ path pending   BREAST LUMPECTOMY Right 08/22/2015   BREAST LUMPECTOMY WITH SENTINEL LYMPH NODE BIOPSY Right 08/22/2015   Procedure: BREAST LUMPECTOMY WITH SENTINEL LYMPH NODE BX;  Surgeon: Christene Lye, MD;  Location: [facility];  Service: General;  Laterality: Right;   COLONOSCOPY W/ POLYPECTOMY  2014   COLONOSCOPY WITH PROPOFOL N/A 04/16/2019   Procedure: COLONOSCOPY WITH BIOPSIES;  Surgeon: Lucilla Lame, MD;  Location: [facility];  Service: Endoscopy;  Laterality: N/A;   DILATION AND CURETTAGE OF UTERUS     20 years ago   [place]     POLYPECTOMY N/A 04/16/2019   Procedure: POLYPECTOMY;  Surgeon: Lucilla Lame, MD;  Location: [facility];  Service: Endoscopy;  Laterality: N/A;   PORTACATH PLACEMENT Right 12/01/2020   Procedure: INSERTION PORT-A-CATH;  Surgeon: [name], MD;  Location:  [facility];  Service: General;  Laterality: Right;   TOTAL MASTECTOMY Left 05/06/2021   Procedure: modified radical mastectomy;  Surgeon: [name], MD;  Location: [facility];  Service: General;  Laterality: Left;  Provider requesting 1.5 hours / 90 minutes for procedure    There were no vitals filed for this visit.   Subjective Assessment - 07/08/21 1029     Subjective  We are going up to working 6 days at work now - radiation doing okay - feel little more tender now over my chest area - cording little better I think - did start Chiropractor for my neuropathy in my feet - swelling under my arm little softer with using the jovipak and sports bra                 LYMPHEDEMA/ONCOLOGY QUESTIONNAIRE - 07/08/21 0001       Right Upper Extremity Lymphedema   15 cm Proximal to Olecranon Process 30 cm    10 cm Proximal to Olecranon Process 28.5 cm    Olecranon Process 26 cm    15 cm Proximal to Ulnar Styloid Process 25.5 cm    Just Proximal to Ulnar Styloid Process 16.5 cm      Left Upper Extremity Lymphedema   15 cm Proximal to Olecranon Process 30 cm    10 cm Proximal to Olecranon Process 29 cm    Olecranon Process 26.7 cm  15 cm Proximal to Ulnar Styloid Process 25.2 cm    Just Proximal to Ulnar Styloid Process 17 cm                  DR Janese Banks Assessment and plan 06/02/21- Patient is a 60 y.o. 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  she is s/p 4 cycles of dose dense AC and 12 weekly cycles of taxol neoadjuvant chemotherapy.  Patient underwent left mastectomy without reconstruction and final pathology showed multifocal invasive mammary carcinoma [id number]   Discussed the results of final pathology with the patient in detail which showed multifocal invasive mammary carcinoma at least 3 in number with the largest focus that was 12 mm grade 2.  3  out of 12 lymph nodes were positive for malignancy.  No extranodal extension.  Patient has already completed neoadjuvant AC Taxol chemotherapy and does not require any adjuvant treatment at this time.   She required port access during surgery and therefore port was not taken out and she will reach out to Dr. Ferne Coe about getting the port out.  I will refer her to Luna Fuse from OT to recommend exercises to prevent lymphedema in the future.  I will also refer her to radiation oncology for consideration of postmastectomy radiation.   Patient's tumor is ER/PR positive and therefore there would be role for  endocrine therapy at this time.  I recommend starting her on letrozole 1 tablet daily along with calcium and vitamin D.  Discussed risks and benefits of letrozole including all but not limited to hot flashes, fatigue, joint aches and possible worsening bone health.  Patient will start taking letrozole upon completion of radiation treatment.  I will tentatively see her back in 3 months with CBC with differential and CMP with a bone density scan prior.  Treatment will be given with a curative intent.   OT SCREEN 06/10/21: Patient is concerned about a pocket of fat which is protruding at the lateral end of her mastectomy scar which rubs against her arm and makes it difficult for her to sleep.Refer to OT Pt assess this date - mastectomy scar healing well - but pt show increase stiffness and decrease AROM in L shoulder ABD and flexion more than ext and int rotation  Pt with pocket of swelling under arm - pt report night time down but increase during day and rub against her inner arm and gets tender  Pt ed on Lymphedema - symptoms and precautions- hand out provided   Pt to wear her bra over the area for compression and discuss with surgeon tomorrow if she can benefit from Unilateral post mastectomy jovipak breast pad to decrease pocket under her arm. 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  Ask if anything that can help Breast Cancer navigator contacted -and provided info on chiropractor and refer to get some acupuncture - they will contact her  to setup  Patient  report that the pocked of swelling under her arm is staying about the same - she is wearing bra for compression and will pick up Unilateral post mastectomy jovipak breast pad today.   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  Ask if anything that can help Breast Cancer navigator contacted  last time-and provided info on chiropractor and refer to get some acupuncture - she report that they did contact her and she has appt tomorrow.   Patient  report that the pocked of swelling under her arm is staying about the same - she is wearing bra for compression and did pick up Unilateral post  mastectomy jovipak breast pad last week and wearing it at night mostly.   Pt ed on Lymphedema - symptoms and precautions- hand out provided with first appt  Pt to  cont to wear her bra over the area for compression and Unilateral post mastectomy jovipak breast pad to decrease pocket under her arm. But with radiation need to stop wearing it if her skin gets to sensitive. Pt to use camisole instead for compression bra at night .   Pt cont to show decrease shoulder  ABD 125 more than flexion 140-  cont to show cording in axilla and interior upper arm -  improved some last time and was able to get one pop today - done only about 2 min of soft tissue- did not want to irritate or cause pain - pain was less with techniques today compare to last week cording now more in axilla but still going some into interior upper arm. 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  Work is increasing to 6 days a week this week -and then she did start the chiropractor for her neuropathy in feet.   Patient  report that the pocked of swelling under her arm is  not as hard - and she is using lighter sports bra with the  Unilateral post mastectomy jovipak breast pad  Still tolerating good.   Pt to  cont to wear her bra over the area for compression and Unilateral post mastectomy jovipak breast pad to decrease pocket under her arm. But reinforce again that with radiation need to stop wearing it if her skin gets to sensitive.  Pt cont to show decrease L shoulder AROM  but improved to ABD 135 more than flexion 140-  cont to show cording in axilla  but not going into upper arm as before. Improving but not able to do as much soft  tissue because of radiation - pt to cont to do some soft tissue in axilla to tolerance and ROM -and follow up in 2 wks  compare to last week cording now more in axilla but still going some into interior upper arm. 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 Circumference of bilateral UE increase this date on L  over bicep - pt report lifting and holding objects at work more in L arm - appear her bicep more developed on the L - will cont to monitor - pt to return in 2 wks                                    Visit Diagnosis: Scar condition and fibrosis of skin  Stiffness of left shoulder, not elsewhere classified    Problem List Patient Active Problem List   Diagnosis Date Noted   Port-A-Cath in place 06/11/2021   S/P mastectomy, left 05/12/2021   Breast cancer metastasized to axillary lymph node, right ([place]) 05/06/2021   Prophylaxis for chemotherapy-induced neutropenia 01/16/2021   Genetic testing 11/26/2020   Goals of care, counseling/discussion 11/04/2020   Breast cancer, left ([place]) 11/04/2020   History of colonic polyps    Polyp of ascending colon    Depression 09/20/2017   DM2 (diabetes mellitus, type 2) ([place]) 09/20/2017   Gout 09/20/2017   HTN (hypertension) 09/20/2017   Migraine 09/20/2017   History of endometrial ablation 02/26/2016   Malignant neoplasm of upper-outer quadrant of left breast in female, estrogen receptor positive ([place]) 08/04/2015    Rosalyn Gess, OTR/L 07/08/2021, 10:30 AM  [facility] [address], [place], [place], [address] Phone: [phone]   Fax:  [phone]  Name: Michelle Walker MRN: [id number] Date of Birth: 1961-03-13

## 2021-07-09 ENCOUNTER — Inpatient Hospital Stay: Payer: 59

## 2021-07-09 ENCOUNTER — Ambulatory Visit
Admission: RE | Admit: 2021-07-09 | Discharge: 2021-07-09 | Disposition: A | Discharge status: Home / Self Care | Payer: 59 | Source: Ambulatory Visit | Attending: Radiation Oncology | Admitting: Radiation Oncology

## 2021-07-09 DIAGNOSIS — Z510 Encounter for antineoplastic radiation therapy: Secondary | ICD-10-CM | POA: Diagnosis not present

## 2021-07-10 ENCOUNTER — Ambulatory Visit
Admission: RE | Admit: 2021-07-10 | Discharge: 2021-07-10 | Disposition: A | Discharge status: Home / Self Care | Payer: 59 | Source: Ambulatory Visit | Attending: Radiation Oncology | Admitting: Radiation Oncology

## 2021-07-10 DIAGNOSIS — Z510 Encounter for antineoplastic radiation therapy: Secondary | ICD-10-CM | POA: Diagnosis not present

## 2021-07-13 ENCOUNTER — Ambulatory Visit
Admission: RE | Admit: 2021-07-13 | Discharge: 2021-07-13 | Disposition: A | Discharge status: Home / Self Care | Payer: 59 | Source: Ambulatory Visit | Attending: Radiation Oncology | Admitting: Radiation Oncology

## 2021-07-13 DIAGNOSIS — Z510 Encounter for antineoplastic radiation therapy: Secondary | ICD-10-CM | POA: Diagnosis not present

## 2021-07-14 ENCOUNTER — Ambulatory Visit
Admission: RE | Admit: 2021-07-14 | Discharge: 2021-07-14 | Disposition: A | Discharge status: Home / Self Care | Payer: 59 | Source: Ambulatory Visit | Attending: Radiation Oncology | Admitting: Radiation Oncology

## 2021-07-14 DIAGNOSIS — Z510 Encounter for antineoplastic radiation therapy: Secondary | ICD-10-CM | POA: Diagnosis not present

## 2021-07-15 ENCOUNTER — Ambulatory Visit
Admission: RE | Admit: 2021-07-15 | Discharge: 2021-07-15 | Disposition: A | Discharge status: Home / Self Care | Payer: 59 | Source: Ambulatory Visit | Attending: Radiation Oncology | Admitting: Radiation Oncology

## 2021-07-15 DIAGNOSIS — Z510 Encounter for antineoplastic radiation therapy: Secondary | ICD-10-CM | POA: Diagnosis not present

## 2021-07-16 ENCOUNTER — Ambulatory Visit
Admission: RE | Admit: 2021-07-16 | Discharge: 2021-07-16 | Disposition: A | Discharge status: Home / Self Care | Payer: 59 | Source: Ambulatory Visit | Attending: Radiation Oncology | Admitting: Radiation Oncology

## 2021-07-16 DIAGNOSIS — Z510 Encounter for antineoplastic radiation therapy: Secondary | ICD-10-CM | POA: Diagnosis not present

## 2021-07-17 ENCOUNTER — Ambulatory Visit
Admission: RE | Admit: 2021-07-17 | Discharge: 2021-07-17 | Disposition: A | Discharge status: Home / Self Care | Payer: 59 | Source: Ambulatory Visit | Attending: Radiation Oncology | Admitting: Radiation Oncology

## 2021-07-17 DIAGNOSIS — Z510 Encounter for antineoplastic radiation therapy: Secondary | ICD-10-CM | POA: Diagnosis not present

## 2021-07-20 ENCOUNTER — Ambulatory Visit
Admission: RE | Admit: 2021-07-20 | Discharge: 2021-07-20 | Disposition: A | Discharge status: Home / Self Care | Payer: 59 | Source: Ambulatory Visit | Attending: Radiation Oncology | Admitting: Radiation Oncology

## 2021-07-20 DIAGNOSIS — Z510 Encounter for antineoplastic radiation therapy: Secondary | ICD-10-CM | POA: Diagnosis not present

## 2021-07-21 ENCOUNTER — Ambulatory Visit
Admission: RE | Admit: 2021-07-21 | Discharge: 2021-07-21 | Disposition: A | Discharge status: Home / Self Care | Payer: 59 | Source: Ambulatory Visit | Attending: Radiation Oncology | Admitting: Radiation Oncology

## 2021-07-21 DIAGNOSIS — Z510 Encounter for antineoplastic radiation therapy: Secondary | ICD-10-CM | POA: Diagnosis not present

## 2021-07-22 ENCOUNTER — Inpatient Hospital Stay: Payer: 59 | Admitting: Occupational Therapy

## 2021-07-22 ENCOUNTER — Ambulatory Visit
Admission: RE | Admit: 2021-07-22 | Discharge: 2021-07-22 | Disposition: A | Discharge status: Home / Self Care | Payer: 59 | Source: Ambulatory Visit | Attending: Radiation Oncology | Admitting: Radiation Oncology

## 2021-07-22 ENCOUNTER — Other Ambulatory Visit: Payer: Self-pay

## 2021-07-22 DIAGNOSIS — Z510 Encounter for antineoplastic radiation therapy: Secondary | ICD-10-CM | POA: Diagnosis not present

## 2021-07-23 ENCOUNTER — Inpatient Hospital Stay: Payer: 59

## 2021-07-23 ENCOUNTER — Ambulatory Visit
Admission: RE | Admit: 2021-07-23 | Discharge: 2021-07-23 | Disposition: A | Discharge status: Home / Self Care | Payer: 59 | Source: Ambulatory Visit | Attending: Radiation Oncology | Admitting: Radiation Oncology

## 2021-07-23 DIAGNOSIS — C50812 Malignant neoplasm of overlapping sites of left female breast: Secondary | ICD-10-CM

## 2021-07-23 DIAGNOSIS — Z510 Encounter for antineoplastic radiation therapy: Secondary | ICD-10-CM | POA: Diagnosis not present

## 2021-07-23 LAB — CBC
HCT: 48.3 % — ABNORMAL HIGH (ref 36.0–46.0)
Hemoglobin: 15.3 g/dL — ABNORMAL HIGH (ref 12.0–15.0)
MCH: 26.4 pg (ref 26.0–34.0)
MCHC: 31.7 g/dL (ref 30.0–36.0)
MCV: 83.4 fL (ref 80.0–100.0)
Platelets: 197 10*3/uL (ref 150–400)
RBC: 5.79 MIL/uL — ABNORMAL HIGH (ref 3.87–5.11)
RDW: 14.5 % (ref 11.5–15.5)
WBC: 3.7 10*3/uL — ABNORMAL LOW (ref 4.0–10.5)
nRBC: 0 % (ref 0.0–0.2)

## 2021-07-24 ENCOUNTER — Ambulatory Visit
Admission: RE | Admit: 2021-07-24 | Discharge: 2021-07-24 | Disposition: A | Discharge status: Home / Self Care | Payer: 59 | Source: Ambulatory Visit | Attending: Radiation Oncology | Admitting: Radiation Oncology

## 2021-07-24 DIAGNOSIS — Z510 Encounter for antineoplastic radiation therapy: Secondary | ICD-10-CM | POA: Diagnosis not present

## 2021-07-27 ENCOUNTER — Ambulatory Visit
Admission: RE | Admit: 2021-07-27 | Discharge: 2021-07-27 | Disposition: A | Discharge status: Home / Self Care | Payer: 59 | Source: Ambulatory Visit | Attending: Radiation Oncology | Admitting: Radiation Oncology

## 2021-07-27 DIAGNOSIS — Z510 Encounter for antineoplastic radiation therapy: Secondary | ICD-10-CM | POA: Diagnosis not present

## 2021-07-28 ENCOUNTER — Ambulatory Visit
Admission: RE | Admit: 2021-07-28 | Discharge: 2021-07-28 | Disposition: A | Discharge status: Home / Self Care | Payer: 59 | Source: Ambulatory Visit | Attending: Radiation Oncology | Admitting: Radiation Oncology

## 2021-07-28 ENCOUNTER — Ambulatory Visit
Admission: RE | Admit: 2021-07-28 | Discharge: 2021-07-28 | Disposition: A | Discharge status: Home / Self Care | Payer: 59 | Source: Ambulatory Visit | Attending: Oncology | Admitting: Oncology

## 2021-07-28 ENCOUNTER — Other Ambulatory Visit: Payer: Self-pay

## 2021-07-28 DIAGNOSIS — C50812 Malignant neoplasm of overlapping sites of left female breast: Secondary | ICD-10-CM | POA: Diagnosis present

## 2021-07-28 DIAGNOSIS — Z170 Estrogen receptor positive status [ER+]: Secondary | ICD-10-CM | POA: Diagnosis present

## 2021-07-29 ENCOUNTER — Ambulatory Visit
Admission: RE | Admit: 2021-07-29 | Discharge: 2021-07-29 | Disposition: A | Discharge status: Home / Self Care | Payer: 59 | Source: Ambulatory Visit | Attending: Radiation Oncology | Admitting: Radiation Oncology

## 2021-07-29 DIAGNOSIS — Z510 Encounter for antineoplastic radiation therapy: Secondary | ICD-10-CM | POA: Diagnosis not present

## 2021-07-30 ENCOUNTER — Ambulatory Visit
Admission: RE | Admit: 2021-07-30 | Discharge: 2021-07-30 | Disposition: A | Discharge status: Home / Self Care | Payer: 59 | Source: Ambulatory Visit | Attending: Radiation Oncology | Admitting: Radiation Oncology

## 2021-07-30 DIAGNOSIS — Z510 Encounter for antineoplastic radiation therapy: Secondary | ICD-10-CM | POA: Diagnosis not present

## 2021-07-31 ENCOUNTER — Ambulatory Visit
Admission: RE | Admit: 2021-07-31 | Discharge: 2021-07-31 | Disposition: A | Discharge status: Home / Self Care | Payer: 59 | Source: Ambulatory Visit | Attending: Radiation Oncology | Admitting: Radiation Oncology

## 2021-07-31 DIAGNOSIS — Z510 Encounter for antineoplastic radiation therapy: Secondary | ICD-10-CM | POA: Diagnosis not present

## 2021-08-01 DIAGNOSIS — Z510 Encounter for antineoplastic radiation therapy: Secondary | ICD-10-CM | POA: Insufficient documentation

## 2021-08-01 DIAGNOSIS — Z170 Estrogen receptor positive status [ER+]: Secondary | ICD-10-CM | POA: Insufficient documentation

## 2021-08-01 DIAGNOSIS — C50812 Malignant neoplasm of overlapping sites of left female breast: Secondary | ICD-10-CM | POA: Insufficient documentation

## 2021-08-03 ENCOUNTER — Ambulatory Visit
Admission: RE | Admit: 2021-08-03 | Discharge: 2021-08-03 | Disposition: A | Discharge status: Home / Self Care | Payer: 59 | Source: Ambulatory Visit | Attending: Radiation Oncology | Admitting: Radiation Oncology

## 2021-08-03 DIAGNOSIS — C50411 Malignant neoplasm of upper-outer quadrant of right female breast: Secondary | ICD-10-CM | POA: Insufficient documentation

## 2021-08-03 DIAGNOSIS — I10 Essential (primary) hypertension: Secondary | ICD-10-CM | POA: Insufficient documentation

## 2021-08-03 DIAGNOSIS — Z79811 Long term (current) use of aromatase inhibitors: Secondary | ICD-10-CM | POA: Insufficient documentation

## 2021-08-03 DIAGNOSIS — F17210 Nicotine dependence, cigarettes, uncomplicated: Secondary | ICD-10-CM | POA: Insufficient documentation

## 2021-08-03 DIAGNOSIS — Z170 Estrogen receptor positive status [ER+]: Secondary | ICD-10-CM | POA: Insufficient documentation

## 2021-08-03 DIAGNOSIS — Z79899 Other long term (current) drug therapy: Secondary | ICD-10-CM | POA: Insufficient documentation

## 2021-08-03 DIAGNOSIS — Z853 Personal history of malignant neoplasm of breast: Secondary | ICD-10-CM | POA: Insufficient documentation

## 2021-08-03 DIAGNOSIS — C773 Secondary and unspecified malignant neoplasm of axilla and upper limb lymph nodes: Secondary | ICD-10-CM | POA: Insufficient documentation

## 2021-08-03 DIAGNOSIS — C50812 Malignant neoplasm of overlapping sites of left female breast: Secondary | ICD-10-CM | POA: Diagnosis present

## 2021-08-03 DIAGNOSIS — Z510 Encounter for antineoplastic radiation therapy: Secondary | ICD-10-CM | POA: Insufficient documentation

## 2021-08-03 DIAGNOSIS — Z923 Personal history of irradiation: Secondary | ICD-10-CM | POA: Insufficient documentation

## 2021-08-04 ENCOUNTER — Ambulatory Visit
Admission: RE | Admit: 2021-08-04 | Discharge: 2021-08-04 | Disposition: A | Discharge status: Home / Self Care | Payer: 59 | Source: Ambulatory Visit | Attending: Radiation Oncology | Admitting: Radiation Oncology

## 2021-08-04 DIAGNOSIS — Z510 Encounter for antineoplastic radiation therapy: Secondary | ICD-10-CM | POA: Diagnosis not present

## 2021-08-05 ENCOUNTER — Ambulatory Visit
Admission: RE | Admit: 2021-08-05 | Discharge: 2021-08-05 | Disposition: A | Discharge status: Home / Self Care | Payer: 59 | Source: Ambulatory Visit | Attending: Radiation Oncology | Admitting: Radiation Oncology

## 2021-08-05 ENCOUNTER — Other Ambulatory Visit: Payer: Self-pay

## 2021-08-05 ENCOUNTER — Inpatient Hospital Stay: Payer: 59 | Attending: Oncology | Admitting: Occupational Therapy

## 2021-08-05 DIAGNOSIS — Z510 Encounter for antineoplastic radiation therapy: Secondary | ICD-10-CM | POA: Diagnosis not present

## 2021-08-05 DIAGNOSIS — M25612 Stiffness of left shoulder, not elsewhere classified: Secondary | ICD-10-CM

## 2021-08-05 DIAGNOSIS — L905 Scar conditions and fibrosis of skin: Secondary | ICD-10-CM

## 2021-08-05 NOTE — Therapy (Signed)
[facility] [address] 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 Wide excision/ RT, T1a,N0. ER/PR pos, Her 2.neg. Tamoxifen x 6 months, d/c secondary to vasomotor sympotms.    Headache    Hypertension    Personal history of radiation therapy 2016   RIGHT lumpectomy   Thyroid disease     Past Surgical History:  Procedure Laterality Date   ANTERIOR CRUCIATE LIGAMENT REPAIR Right    BREAST BIOPSY Right 07/17/2015   INVASIVE LOBULAR CARCINOMA, CLASSIC TYPE (1.5 MM), ARISING IN A    BREAST BIOPSY Left 09/19/2020   [place] bx 3 areas/ 1oc q clip/ 12 oc vision clip, axilla lymph node hydromark shape 3/ path pending   BREAST LUMPECTOMY Right 08/22/2015   BREAST LUMPECTOMY WITH SENTINEL LYMPH NODE BIOPSY Right 08/22/2015   Procedure: BREAST LUMPECTOMY WITH SENTINEL LYMPH NODE BX;  Surgeon: Christene Lye, MD;  Location: [facility];  Service: General;  Laterality: Right;   COLONOSCOPY W/ POLYPECTOMY  2014   COLONOSCOPY WITH PROPOFOL N/A 04/16/2019   Procedure: COLONOSCOPY WITH BIOPSIES;  Surgeon: Lucilla Lame, MD;  Location: [facility];  Service: Endoscopy;  Laterality: N/A;   DILATION AND CURETTAGE OF UTERUS     20 years ago   [place]     POLYPECTOMY N/A 04/16/2019   Procedure: POLYPECTOMY;  Surgeon: Lucilla Lame, MD;  Location: [facility];  Service: Endoscopy;  Laterality: N/A;   PORTACATH PLACEMENT Right 12/01/2020   Procedure: INSERTION PORT-A-CATH;  Surgeon: [name], MD;  Location:  [facility];  Service: General;  Laterality: Right;   TOTAL MASTECTOMY Left 05/06/2021   Procedure: modified radical mastectomy;  Surgeon: [name], MD;  Location: [facility];  Service: General;  Laterality: Left;  Provider requesting 1.5 hours / 90 minutes for procedure    There were no vitals filed for this visit.   Subjective Assessment - 08/05/21 1107     Subjective  I am so tired - have 5 more radiation sessions- doing okay- skin just tender and red up to my neck- focus no on my scars - still that cording or tightness in my armpit but keeping some of the motion - the acupucture did help some - have little more feeling in my toes , and burning pain no as bad- did schedule another 2 sessions after the 5 free ones                 LYMPHEDEMA/ONCOLOGY QUESTIONNAIRE - 08/05/21 0001       Right Upper Extremity Lymphedema   15 cm Proximal to Olecranon Process 29.5 cm    10 cm Proximal to Olecranon Process 28.5 cm    Olecranon Process 25.5 cm    15 cm Proximal to Ulnar Styloid Process 25 cm      Left Upper Extremity Lymphedema   15 cm Proximal to Olecranon Process 29.7 cm    10 cm Proximal to Olecranon Process 29 cm  Olecranon Process 26.4 cm    15 cm Proximal to Ulnar Styloid Process 24.5 cm                DR Janese Banks Assessment and plan 06/02/21- Patient is a 60 y.o. 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  she is s/p 4 cycles of dose dense AC and 12 weekly cycles of taxol neoadjuvant chemotherapy.  Patient underwent left mastectomy without reconstruction and final pathology showed multifocal invasive mammary carcinoma [id number]   Discussed the results of final pathology with the patient in detail which showed multifocal invasive mammary carcinoma at least 3 in number with the largest focus that was 12 mm grade 2.  3 out of 12 lymph nodes were  positive for malignancy.  No extranodal extension.  Patient has already completed neoadjuvant AC Taxol chemotherapy and does not require any adjuvant treatment at this time.   She required port access during surgery and therefore port was not taken out and she will reach out to Dr. Ferne Coe about getting the port out.  I will refer her to Luna Fuse from OT to recommend exercises to prevent lymphedema in the future.  I will also refer her to radiation oncology for consideration of postmastectomy radiation.   Patient's tumor is ER/PR positive and therefore there would be role for  endocrine therapy at this time.  I recommend starting her on letrozole 1 tablet daily along with calcium and vitamin D.  Discussed risks and benefits of letrozole including all but not limited to hot flashes, fatigue, joint aches and possible worsening bone health.  Patient will start taking letrozole upon completion of radiation treatment.  I will tentatively see her back in 3 months with CBC with differential and CMP with a bone density scan prior.  Treatment will be given with a curative intent.   OT SCREEN 06/10/21: Patient is concerned about a pocket of fat which is protruding at the lateral end of her mastectomy scar which rubs against her arm and makes it difficult for her to sleep.Refer to OT Pt assess this date - mastectomy scar healing well - but pt show increase stiffness and decrease AROM in L shoulder ABD and flexion more than ext and int rotation  Pt with pocket of swelling under arm - pt report night time down but increase during day and rub against her inner arm and gets tender  Pt ed on Lymphedema - symptoms and precautions- hand out provided   Pt to wear her bra over the area for compression and discuss with surgeon tomorrow if she can benefit from Unilateral post mastectomy jovipak breast pad to decrease pocket under her arm. 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  Ask if anything that can help Breast Cancer navigator contacted -and provided info on chiropractor and refer to get some acupuncture - they will contact her to setup  Patient  report  that the pocked of swelling under her arm is staying about the same - she is wearing bra for compression and will pick up Unilateral post mastectomy jovipak breast pad today.   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  Ask if anything that can help Breast Cancer navigator contacted  last time-and provided info on chiropractor and refer to get some acupuncture - she report that they did contact her and she has appt tomorrow.   Patient  report that the pocked of swelling under her arm is staying about the same - she is wearing bra for compression and did pick up Unilateral post mastectomy jovipak  breast pad last week and wearing it at night mostly.   Pt ed on Lymphedema - symptoms and precautions- hand out provided with first appt  Pt to  cont to wear her bra over the area for compression and Unilateral post mastectomy jovipak breast pad to decrease pocket under her arm. But with radiation need to stop wearing it if her skin gets to sensitive. Pt to use camisole instead for compression bra at night .   Pt cont to show decrease shoulder  ABD 125 more than flexion 140-  cont to show cording in axilla and interior upper arm -  improved some last time and was able to get one pop today - done only about 2 min of soft tissue- did not want to irritate or cause pain - pain was less with techniques today compare to last week cording now more in axilla but still going some into interior upper arm. 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  Work is increasing to 6 days a week this week -and then she did start the chiropractor for her neuropathy in feet.    Patient  report that the pocked of swelling under her arm is  not as hard - and she is using lighter sports bra with the  Unilateral post mastectomy jovipak breast pad  Still tolerating good.    Pt to  cont to wear her bra over the area for compression and Unilateral post mastectomy jovipak breast pad to decrease pocket under her arm. But reinforce again that with radiation need to stop wearing it if her skin gets to sensitive.   Pt  cont to show decrease L shoulder AROM  but improved to ABD 135 more than flexion 140-  cont to show cording in axilla  but not going into upper arm as before. Improving but not able to do as much soft tissue because of  radiation - pt to cont to do some soft tissue in axilla to tolerance and ROM -and follow up in 2 wks  compare to last week cording now more in axilla but still going some into interior upper arm. 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 Circumference of bilateral UE increase this date on L  over bicep - pt report lifting and holding objects at work more in L arm - appear her bicep more developed on the L - will cont to monitor - pt to return in 2 wks  OT SCREEN 08/05/21:     Pt seen month ago -has 5 more radiation session left  - shoulder motion about the same and and cording in L axilla- tender from radiation cannot do to much soft tissue or manual- but pt encourage to cont AAROM for shoulder flexion ,ABD and ext rotation  Work is increasing to 6-7 days a week this week  but still on the lighter duty  Done her 5 sessions with chiropractor for her neuropathy in feet. Little better- burning pain better and more feeling in her toes -schedule 2 more sessions   Patient  report that the pocked of swelling under her arm is  not as hard - and she is using lighter sports bra with the  Unilateral post mastectomy jovipak breast pad  if she can tolerate it       Cont to show cording in axilla  but not going into upper arm as before. 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 Circumference of bilateral UE decrease some since month ago - but L now only increase in mid upper arm - others WNL compare to R - appear her bicep more developed on the L  still- will cont to monitor - pt to return in 5  wks when can tolerate manual therapy                                       Patient will benefit from skilled therapeutic intervention in order to improve the following deficits and impairments:           Visit Diagnosis: Scar condition and fibrosis of skin  Stiffness of left shoulder, not elsewhere  classified    Problem List Patient Active Problem List   Diagnosis Date Noted   Port-A-Cath in place 06/11/2021   S/P mastectomy, left 05/12/2021   Breast cancer metastasized to axillary lymph node, right ([place]) 05/06/2021   Prophylaxis for chemotherapy-induced neutropenia 01/16/2021   Genetic testing 11/26/2020   Goals of care, counseling/discussion 11/04/2020   Breast cancer, left ([place]) 11/04/2020   History of colonic polyps    Polyp of ascending colon    Depression 09/20/2017   DM2 (diabetes mellitus, type 2) ([place]) 09/20/2017   Gout 09/20/2017   HTN (hypertension) 09/20/2017   Migraine 09/20/2017   History of endometrial ablation 02/26/2016   Malignant neoplasm of upper-outer quadrant of left breast in female, estrogen receptor positive ([place]) 08/04/2015    Rosalyn Gess, OTR/L,CLT 08/05/2021, 11:09 AM  [place]  [facility] [address] [place], [place], [address] Phone: [phone]   Fax:  [phone]  Name: Michelle Walker MRN: [id number] Date of Birth: 1961/04/22

## 2021-08-06 ENCOUNTER — Ambulatory Visit
Admission: RE | Admit: 2021-08-06 | Discharge: 2021-08-06 | Disposition: A | Discharge status: Home / Self Care | Payer: 59 | Source: Ambulatory Visit | Attending: Radiation Oncology | Admitting: Radiation Oncology

## 2021-08-06 ENCOUNTER — Inpatient Hospital Stay: Payer: 59

## 2021-08-06 DIAGNOSIS — Z510 Encounter for antineoplastic radiation therapy: Secondary | ICD-10-CM | POA: Diagnosis not present

## 2021-08-06 DIAGNOSIS — C50812 Malignant neoplasm of overlapping sites of left female breast: Secondary | ICD-10-CM

## 2021-08-06 DIAGNOSIS — Z170 Estrogen receptor positive status [ER+]: Secondary | ICD-10-CM

## 2021-08-06 LAB — CBC
HCT: 51.3 % — ABNORMAL HIGH (ref 36.0–46.0)
Hemoglobin: 16.2 g/dL — ABNORMAL HIGH (ref 12.0–15.0)
MCH: 26.4 pg (ref 26.0–34.0)
MCHC: 31.6 g/dL (ref 30.0–36.0)
MCV: 83.6 fL (ref 80.0–100.0)
Platelets: 174 10*3/uL (ref 150–400)
RBC: 6.14 MIL/uL — ABNORMAL HIGH (ref 3.87–5.11)
RDW: 15.7 % — ABNORMAL HIGH (ref 11.5–15.5)
WBC: 2.6 10*3/uL — ABNORMAL LOW (ref 4.0–10.5)
nRBC: 0 % (ref 0.0–0.2)

## 2021-08-07 ENCOUNTER — Ambulatory Visit
Admission: RE | Admit: 2021-08-07 | Discharge: 2021-08-07 | Disposition: A | Discharge status: Home / Self Care | Payer: 59 | Source: Ambulatory Visit | Attending: Radiation Oncology | Admitting: Radiation Oncology

## 2021-08-07 DIAGNOSIS — Z510 Encounter for antineoplastic radiation therapy: Secondary | ICD-10-CM | POA: Diagnosis not present

## 2021-08-10 ENCOUNTER — Ambulatory Visit
Admission: RE | Admit: 2021-08-10 | Discharge: 2021-08-10 | Disposition: A | Discharge status: Home / Self Care | Payer: 59 | Source: Ambulatory Visit | Attending: Radiation Oncology | Admitting: Radiation Oncology

## 2021-08-10 DIAGNOSIS — Z510 Encounter for antineoplastic radiation therapy: Secondary | ICD-10-CM | POA: Diagnosis not present

## 2021-08-11 ENCOUNTER — Ambulatory Visit
Admission: RE | Admit: 2021-08-11 | Discharge: 2021-08-11 | Disposition: A | Discharge status: Home / Self Care | Payer: 59 | Source: Ambulatory Visit | Attending: Radiation Oncology | Admitting: Radiation Oncology

## 2021-08-11 DIAGNOSIS — Z510 Encounter for antineoplastic radiation therapy: Secondary | ICD-10-CM | POA: Diagnosis not present

## 2021-08-25 ENCOUNTER — Encounter: Payer: Self-pay | Admitting: Internal Medicine

## 2021-09-04 ENCOUNTER — Other Ambulatory Visit: Payer: Self-pay

## 2021-09-04 ENCOUNTER — Encounter: Payer: Self-pay | Admitting: Oncology

## 2021-09-04 ENCOUNTER — Inpatient Hospital Stay (HOSPITAL_BASED_OUTPATIENT_CLINIC_OR_DEPARTMENT_OTHER): Payer: 59 | Admitting: Oncology

## 2021-09-04 ENCOUNTER — Inpatient Hospital Stay: Payer: 59 | Attending: Oncology

## 2021-09-04 DIAGNOSIS — D751 Secondary polycythemia: Secondary | ICD-10-CM | POA: Insufficient documentation

## 2021-09-04 DIAGNOSIS — F17210 Nicotine dependence, cigarettes, uncomplicated: Secondary | ICD-10-CM | POA: Diagnosis not present

## 2021-09-04 DIAGNOSIS — I10 Essential (primary) hypertension: Secondary | ICD-10-CM | POA: Diagnosis not present

## 2021-09-04 DIAGNOSIS — G620 Drug-induced polyneuropathy: Secondary | ICD-10-CM

## 2021-09-04 DIAGNOSIS — Z170 Estrogen receptor positive status [ER+]: Secondary | ICD-10-CM

## 2021-09-04 DIAGNOSIS — C773 Secondary and unspecified malignant neoplasm of axilla and upper limb lymph nodes: Secondary | ICD-10-CM | POA: Insufficient documentation

## 2021-09-04 DIAGNOSIS — C50812 Malignant neoplasm of overlapping sites of left female breast: Secondary | ICD-10-CM

## 2021-09-04 DIAGNOSIS — Z808 Family history of malignant neoplasm of other organs or systems: Secondary | ICD-10-CM | POA: Insufficient documentation

## 2021-09-04 DIAGNOSIS — C50012 Malignant neoplasm of nipple and areola, left female breast: Secondary | ICD-10-CM

## 2021-09-04 DIAGNOSIS — T451X5A Adverse effect of antineoplastic and immunosuppressive drugs, initial encounter: Secondary | ICD-10-CM

## 2021-09-04 LAB — COMPREHENSIVE METABOLIC PANEL
ALT: 10 U/L (ref 0–44)
AST: 19 U/L (ref 15–41)
Albumin: 3.8 g/dL (ref 3.5–5.0)
Alkaline Phosphatase: 81 U/L (ref 38–126)
Anion gap: 11 (ref 5–15)
BUN: 12 mg/dL (ref 6–20)
CO2: 29 mmol/L (ref 22–32)
Calcium: 10.2 mg/dL (ref 8.9–10.3)
Chloride: 98 mmol/L (ref 98–111)
Creatinine, Ser: 0.77 mg/dL (ref 0.44–1.00)
GFR, Estimated: >60 mL/min (ref >60)
Glucose, Bld: 87 mg/dL (ref 70–99)
Potassium: 2.8 mmol/L — ABNORMAL LOW (ref 3.5–5.1)
Sodium: 138 mmol/L (ref 135–145)
Total Bilirubin: 0.5 mg/dL (ref 0.3–1.2)
Total Protein: 7.4 g/dL (ref 6.5–8.1)

## 2021-09-04 LAB — CBC WITH DIFFERENTIAL/PLATELET
Abs Immature Granulocytes: 0.01 10*3/uL (ref 0.00–0.07)
Basophils Absolute: 0 10*3/uL (ref 0.0–0.1)
Basophils Relative: 1 %
Eosinophils Absolute: 0.1 10*3/uL (ref 0.0–0.5)
Eosinophils Relative: 2 %
HCT: 49.7 % — ABNORMAL HIGH (ref 36.0–46.0)
Hemoglobin: 16 g/dL — ABNORMAL HIGH (ref 12.0–15.0)
Immature Granulocytes: 0 %
Lymphocytes Relative: 21 %
Lymphs Abs: 0.9 10*3/uL (ref 0.7–4.0)
MCH: 26.4 pg (ref 26.0–34.0)
MCHC: 32.2 g/dL (ref 30.0–36.0)
MCV: 81.9 fL (ref 80.0–100.0)
Monocytes Absolute: 0.5 10*3/uL (ref 0.1–1.0)
Monocytes Relative: 11 %
Neutro Abs: 2.7 10*3/uL (ref 1.7–7.7)
Neutrophils Relative %: 65 %
Platelets: 194 10*3/uL (ref 150–400)
RBC: 6.07 MIL/uL — ABNORMAL HIGH (ref 3.87–5.11)
RDW: 16.3 % — ABNORMAL HIGH (ref 11.5–15.5)
WBC: 4.2 10*3/uL (ref 4.0–10.5)
nRBC: 0 % (ref 0.0–0.2)

## 2021-09-04 MED ORDER — DULOXETINE HCL 30 MG PO CPEP: 30.0000 mg | ORAL_CAPSULE | Freq: Every day | ORAL | 0 refills | Status: DC

## 2021-09-04 NOTE — Progress Notes (Deleted)
Lab order for patient to have CBC drawn at the next visit with PCP, Dr. Rosario Jacks, typed in letters secion of chart.    Attempted to fax to PCP office but the fax number [phone] returns with "no answer" message.  Left message on with Dr. Rosario Jacks office, [phone], requesting a call back for fax number to send lab orders.

## 2021-09-04 NOTE — Progress Notes (Signed)
error 

## 2021-09-04 NOTE — Progress Notes (Signed)
Hematology/Oncology Consult note [facility]  [phone] [phone]  Patient Care Team: Casilda Carls, MD as PCP - General (Internal Medicine) Casilda Carls, MD as Consulting Physician (Internal Medicine) Christene Lye, MD (General Surgery) Garrel Ridgel, [place] as Consulting Physician (Podiatry) Sindy Guadeloupe, MD as Consulting Physician (Hematology and Oncology)   Name of the patient: Michelle Walker  [id number]  [birth year]   Date of visit: 09/04/21  Diagnosis- recurrent left breast cancer stage II [id number] ER/PR positive HER-2 negative    Chief complaint/ Reason for visit-discuss further management of breast cancer  Heme/Onc history:  Patient is a 60 year old female who was diagnosed with 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  She did not require adjuvant chemotherapy.  She was initially on tamoxifen which she could not tolerate and was switched to letrozole.  Last seen by me in June 2019 at which point she had a menstrual cycle and therefore not given another AI.  She had subsequently did not follow-up with me and remained off hormone therapy.     More recently patient underwent a diagnostic bilateral mammogram which showed a 2.2 x 1.9 x 1.9 cm mass at the 12 o'clock position of the left breast 1 cm from the nipple.  She was also found to have 5 other smaller breast masses ranging from 0.3 to 0.8 cm.  Noted to have a solitary left axillary lymph node with cortical thickening.  She had a biopsy of the dominant left breast mass as well as another smaller breast mass and the left axillary lymph node all of which were positive for metastatic invasive mammary carcinoma grade 2.  Tumor was ER 91 200% positive PR 71 to 80% positive and HER-2 IHC equivocal but negative by FISH   MRI of the bilateral breasts showed no abnormal findings in the right breast.  At  least 6 discrete hypoechoic masses in the left breast with the largest one measuring 2.2 cm with 1 abnormal left axillary lymph node.  The size of the other breast masses ranging from 22m to 1.4 cm.  The total area of masses and non-mass enhancement was about 10 x 5 x 6.5 cm.  CT chest abdomen and pelvis with contrast showed no evidence of distant metastatic disease.  Bone scan was also negative.   MammaPrint done on the biopsy specimen came back as high risk with greater than 12% chemotherapy benefit.  Plan is for neoadjuvant dose dense AC-Taxol chemotherapy followed by surgery.  Start of chemotherapy was delayed since patient tested positive for Covid   Patient completed neoadjuvant ddAC- taxol chemo between feb-June 2022.    Patient underwent left mastectomy with axillary lymph node dissection.  Final pathology showed 12 mm multifocal invasive mammary carcinoma at least 3 of them in number overall grade 2 with negative margins.  Metastatic carcinoma involving 3 out of 12 lymph nodes.  No extranodal extension identified.  Largest site of metastatic deposit 3 mm.  pT1c pN1a   Patient completed adjuvant radiation therapy in October 2022  Interval history-patient is completing radiation treatment but has not started taking letrozole yet.  She does complain of tingling and numbness in her bilateral hands and feet.  This has persisted despite going for acupuncture..  She continues to smoke  ECOG PS- 1 Pain scale- 3   Review of systems- Review of Systems  Constitutional:  Positive for malaise/fatigue. Negative for chills, fever  and weight loss.  HENT:  Negative for congestion, ear discharge and nosebleeds.   Eyes:  Negative for blurred vision.  Respiratory:  Negative for cough, hemoptysis, sputum production, shortness of breath and wheezing.   Cardiovascular:  Negative for chest pain, palpitations, orthopnea and claudication.  Gastrointestinal:  Negative for abdominal pain, blood in stool,  constipation, diarrhea, heartburn, melena, nausea and vomiting.  Genitourinary:  Negative for dysuria, flank pain, frequency, hematuria and urgency.  Musculoskeletal:  Negative for back pain, joint pain and myalgias.  Skin:  Negative for rash.  Neurological:  Positive for sensory change (Peripheral neuropathy). Negative for dizziness, tingling, focal weakness, seizures, weakness and headaches.  Endo/Heme/Allergies:  Does not bruise/bleed easily.  Psychiatric/Behavioral:  Negative for depression and suicidal ideas. The patient does not have insomnia.       Allergies  Allergen Reactions   Triamcinolone Other (See Comments)    Hypopigmentation s/p steroid injection   Tape Rash    SURGICAL TAPE AFTER BREAST BIOPSY     Past Medical History:  Diagnosis Date   Anemia    Arthritis    Breast cancer (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 Wide excision/ RT, T1a,N0. ER/PR pos, Her 2.neg. Tamoxifen x 6 months, d/c secondary to vasomotor sympotms.    Headache    Hypertension    Personal history of radiation therapy 2016   RIGHT lumpectomy   Thyroid disease      Past Surgical History:  Procedure Laterality Date   ANTERIOR CRUCIATE LIGAMENT REPAIR Right    BREAST BIOPSY Right 07/17/2015   INVASIVE LOBULAR CARCINOMA, CLASSIC TYPE (1.5 MM), ARISING IN A    BREAST BIOPSY Left 09/19/2020   [place] bx 3 areas/ 1oc q clip/ 12 oc vision clip, axilla lymph node hydromark shape 3/ path pending   BREAST LUMPECTOMY Right 08/22/2015   BREAST LUMPECTOMY WITH SENTINEL LYMPH NODE BIOPSY Right 08/22/2015   Procedure: BREAST LUMPECTOMY WITH SENTINEL LYMPH NODE BX;  Surgeon: Christene Lye, MD;  Location: [facility];  Service: General;  Laterality: Right;   COLONOSCOPY W/ POLYPECTOMY  2014   COLONOSCOPY WITH PROPOFOL N/A 04/16/2019   Procedure: COLONOSCOPY WITH BIOPSIES;  Surgeon: Lucilla Lame, MD;  Location: [place];  Service: Endoscopy;  Laterality: N/A;    DILATION AND CURETTAGE OF UTERUS     20 years ago   [place]     POLYPECTOMY N/A 04/16/2019   Procedure: POLYPECTOMY;  Surgeon: Lucilla Lame, MD;  Location: [place];  Service: Endoscopy;  Laterality: N/A;   PORTACATH PLACEMENT Right 12/01/2020   Procedure: INSERTION PORT-A-CATH;  Surgeon: [name], MD;  Location: [facility];  Service: General;  Laterality: Right;   TOTAL MASTECTOMY Left 05/06/2021   Procedure: modified radical mastectomy;  Surgeon: [name], MD;  Location: [facility];  Service: General;  Laterality: Left;  Provider requesting 1.5 hours / 90 minutes for procedure    Social History   Socioeconomic History   Marital status: Married    Spouse name: Not on file   Number of children: Not on file   Years of education: Not on file   Highest education level: Not on file  Occupational History   Not on file  Tobacco Use   Smoking status: Every Day    Packs/day: 0.75    Years: 40.00    Pack years: 30.00    Types: Cigarettes   Smokeless tobacco: Never  Vaping Use   Vaping Use: Never used  Substance  and Sexual Activity   Alcohol use: Yes    Alcohol/week: 2.0 standard drinks    Types: 2 Standard drinks or equivalent per week    Comment: BEER 1-2 QD   Drug use: No   Sexual activity: Yes    Comment: ablation  Other Topics Concern   Not on file  Social History Narrative   Not on file   Social Determinants of Health   Financial Resource Strain: Not on file  Food Insecurity: Not on file  Transportation Needs: Not on file  Physical Activity: Not on file  Stress: Not on file  Social Connections: Not on file  Intimate Partner Violence: Not on file    Family History  Problem Relation Age of Onset   Stroke Mother    Cancer Sister        sarcoma on arm; chemo   Diabetes Sister    Stroke Sister    Diabetes Brother    Breast cancer Neg Hx    Ovarian cancer Neg Hx    Colon cancer Neg Hx    Heart disease Neg Hx      Current  Outpatient Medications:    allopurinol (ZYLOPRIM) 100 MG tablet, Take 100 mg by mouth at bedtime., Disp: , Rfl:    amLODipine (NORVASC) 10 MG tablet, Take 10 mg by mouth at bedtime. , Disp: , Rfl: 0   Calcium Carb-Cholecalciferol (CALCIUM 600+D3 PO), Take 1 tablet by mouth at bedtime., Disp: , Rfl:    DULoxetine (CYMBALTA) 30 MG capsule, Take 1 capsule (30 mg total) by mouth daily. 1 capsule a day for 7 days and then 2 tablets daily for the rest of prescription, Disp: 53 capsule, Rfl: 0   hydrochlorothiazide (HYDRODIURIL) 25 MG tablet, Take 25 mg by mouth daily., Disp: , Rfl:    letrozole (FEMARA) 2.5 MG tablet, Take 1 tablet (2.5 mg total) by mouth daily., Disp: 30 tablet, Rfl: 3   losartan (COZAAR) 100 MG tablet, Take 100 mg by mouth at bedtime. , Disp: , Rfl: 0   metoprolol succinate (TOPROL-XL) 25 MG 24 hr tablet, Take 50 mg by mouth at bedtime., Disp: , Rfl: 0   PROVENTIL HFA 108 (90 Base) MCG/ACT inhaler, Inhale 2 puffs into the lungs every 6 (six) hours as needed for shortness of breath or wheezing., Disp: , Rfl:  No current facility-administered medications for this visit.  Facility-Administered Medications Ordered in Other Visits:    sodium chloride flush (NS) 0.9 % injection 10 mL, 10 mL, Intravenous, PRN, Sindy Guadeloupe, MD, 10 mL at 03/27/21 0813  Physical exam:  Physical Exam Constitutional:      General: She is not in acute distress. Cardiovascular:     Rate and Rhythm: Normal rate and regular rhythm.     Heart sounds: Normal heart sounds.  Pulmonary:     Effort: Pulmonary effort is normal.     Breath sounds: Normal breath sounds.  Skin:    General: Skin is warm and dry.  Neurological:     Mental Status: She is alert and oriented to person, place, and time.     CMP Latest Ref Rng & Units 09/04/2021  Glucose 70 - 99 mg/dL 87  BUN 6 - 20 mg/dL 12  Creatinine 0.44 - 1.00 mg/dL 0.77  Sodium 135 - 145 mmol/L 138  Potassium 3.5 - 5.1 mmol/L 2.8(L)  Chloride 98 - 111 mmol/L  98  CO2 22 - 32 mmol/L 29  Calcium 8.9 - 10.3 mg/dL 10.2  Total  Protein 6.5 - 8.1 g/dL 7.4  Total Bilirubin 0.3 - 1.2 mg/dL 0.5  Alkaline Phos 38 - 126 U/L 81  AST 15 - 41 U/L 19  ALT 0 - 44 U/L 10   CBC Latest Ref Rng & Units 09/04/2021  WBC 4.0 - 10.5 K/uL 4.2  Hemoglobin 12.0 - 15.0 g/dL 16.0(H)  Hematocrit 36.0 - 46.0 % 49.7(H)  Platelets 150 - 400 K/uL 194     Assessment and plan- Patient is a 60 y.o. 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  she is s/p 4 cycles of dose dense AC and 12 weekly cycles of taxol neoadjuvant chemotherapy.  Patient underwent left mastectomy without reconstruction and final pathology showed multifocal invasive mammary carcinoma [id number].  Patient has completed adjuvant radiation treatment and this is a routine follow-up visit  Bone density scan in September 2022 was normal.  Patient was supposed to start letrozole after completing radiation treatment but she did not pick up her prescription from the pharmacy and will do so today.  I have reemphasized the importance of taking letrozole along with calcium and vitamin D given that she has had recurrent breast cancer after she came off hormone therapy the first time.  I will see her back in 1 month after she starts taking letrozole.  Given that she had high risk breast cancer enough to warrant adjuvant chemotherapy she would also benefit from adjuvant Zometa as per ASCO guidelines it can be given either every 3 months for 2 years then every 6 months for 3 years.  Discussed risks and benefits of Zometa including all but not limited to possible risk of hypocalcemia and osteonecrosis of the jaw.  Patient understands and agrees to proceed as planned.  We will obtain dental clearance before proceeding with Zometa that is obtained she will get her first dose in 1 month as  well.  Polycythemia: Suspect it is secondary due to smoking.  We will check EPO and JAK2 levels along with CMP at next visit  Chemo induced peripheral neuropathy: We will start her on Cymbalta 30 mg once a day for a week and following that increase to 60 mg.   Visit Diagnosis 1. Malignant neoplasm of areola of left breast in female, estrogen receptor positive ([place])   2. Polycythemia, secondary   3. Chemotherapy-induced peripheral neuropathy ([place])      Dr. [name], MD, MPH [facility] at [facility] [phone] 09/04/2021 2:03 PM

## 2021-09-04 NOTE — Progress Notes (Signed)
Pt still has hip pain and sometimes bilateral . She did do 12 times of acupuncture and it helped some but she would like to get medication to make it better. She is doing good with eating and drinking

## 2021-09-09 ENCOUNTER — Other Ambulatory Visit: Payer: Self-pay | Admitting: *Deleted

## 2021-09-09 NOTE — Progress Notes (Signed)
Letter for zometa clearance

## 2021-09-10 DIAGNOSIS — M7061 Trochanteric bursitis, right hip: Secondary | ICD-10-CM | POA: Insufficient documentation

## 2021-09-10 DIAGNOSIS — M25551 Pain in right hip: Secondary | ICD-10-CM | POA: Insufficient documentation

## 2021-09-10 HISTORY — DX: Pain in right hip: M25.551

## 2021-09-15 ENCOUNTER — Telehealth: Payer: Self-pay | Admitting: *Deleted

## 2021-09-15 DIAGNOSIS — Z170 Estrogen receptor positive status [ER+]: Secondary | ICD-10-CM

## 2021-09-15 NOTE — Telephone Encounter (Signed)
Called pt and asked her about doing the zometa. I did get the approval form from dentist and I wanted to see if she is ok to start treatment. She told me that her left hip has been hurting and she got a inj in it but not helped yet. Seeing ortho 11/21. She has consented over the phone for the zometa that will be every 3 months for 2 years. She can come 11/22 8:15 for labs and then zometa IV. She understands

## 2021-09-16 ENCOUNTER — Encounter: Payer: Self-pay | Admitting: Radiation Oncology

## 2021-09-16 ENCOUNTER — Inpatient Hospital Stay: Payer: 59 | Admitting: Occupational Therapy

## 2021-09-16 ENCOUNTER — Ambulatory Visit
Admission: RE | Admit: 2021-09-16 | Discharge: 2021-09-16 | Disposition: A | Discharge status: Home / Self Care | Payer: 59 | Source: Ambulatory Visit | Attending: Radiation Oncology | Admitting: Radiation Oncology

## 2021-09-16 ENCOUNTER — Other Ambulatory Visit: Payer: Self-pay

## 2021-09-16 VITALS — BP 146/94 | HR 103 | Temp 97.6°F | Wt 174.5 lb

## 2021-09-16 DIAGNOSIS — C50411 Malignant neoplasm of upper-outer quadrant of right female breast: Secondary | ICD-10-CM | POA: Insufficient documentation

## 2021-09-16 DIAGNOSIS — G629 Polyneuropathy, unspecified: Secondary | ICD-10-CM | POA: Insufficient documentation

## 2021-09-16 DIAGNOSIS — Z79811 Long term (current) use of aromatase inhibitors: Secondary | ICD-10-CM | POA: Diagnosis not present

## 2021-09-16 DIAGNOSIS — C50812 Malignant neoplasm of overlapping sites of left female breast: Secondary | ICD-10-CM

## 2021-09-16 DIAGNOSIS — Z923 Personal history of irradiation: Secondary | ICD-10-CM | POA: Diagnosis not present

## 2021-09-16 DIAGNOSIS — L905 Scar conditions and fibrosis of skin: Secondary | ICD-10-CM

## 2021-09-16 DIAGNOSIS — M25612 Stiffness of left shoulder, not elsewhere classified: Secondary | ICD-10-CM

## 2021-09-16 DIAGNOSIS — Z170 Estrogen receptor positive status [ER+]: Secondary | ICD-10-CM | POA: Diagnosis not present

## 2021-09-16 DIAGNOSIS — C7989 Secondary malignant neoplasm of other specified sites: Secondary | ICD-10-CM | POA: Insufficient documentation

## 2021-09-16 NOTE — Progress Notes (Signed)
Radiation Oncology Follow up Note  Name: Michelle Walker   Date:   09/16/2021 MRN:  [id number] DOB: 1961-10-21    This 60 y.o. female presents to the clinic today for 1 month follow-up status post left chest wall and peripheral lymphatic radiation therapy for stage II (T2 N1 M0) ER/PR positive HER2 negative invasive mammary carcinoma status post neoadjuvant chemotherapy followed by left modified radical mastectomy.  REFERRING PROVIDER: Casilda Carls, MD  HPI: Patient is a 60 year old female now at 1 month radiation therapy to her left chest wall and peripheral lymphatics for stage II ER/PR positive invasive mammary carcinoma status post neoadjuvant chemotherapy followed by left modified radical mastectomy.  She is also treated to her right breast in 2016.  Seen today in routine follow-up she is having some exacerbation of lower back pain and some peripheral neuropathy in her lower extremities.  This all preceded her radiation treatments.  She specifically denies any swelling in her left upper extremity any chest wall pain or discomfort or cough..  She has been started on Femara tolerating it well without side effect  COMPLICATIONS OF TREATMENT: none  FOLLOW UP COMPLIANCE: keeps appointments   PHYSICAL EXAM:  BP (!) 146/94   Pulse (!) 103   Temp 97.6 F (36.4 C) (Tympanic)   Wt 174 lb 8 oz (79.2 kg)   BMI 28.17 kg/m  She still has some hyperpigmentation of the left chest wall.  No evidence of mass or nodularity is noted.  Right breast is no dominant mass.  No axillary or supraclavicular adenopathy is appreciated.  Well-developed well-nourished patient in NAD. HEENT reveals PERLA, EOMI, discs not visualized.  Oral cavity is clear. No oral mucosal lesions are identified. Neck is clear without evidence of cervical or supraclavicular adenopathy. Lungs are clear to A&P. Cardiac examination is essentially unremarkable with regular rate and rhythm without murmur rub or thrill. Abdomen is benign  with no organomegaly or masses noted. Motor sensory and DTR levels are equal and symmetric in the upper and lower extremities. Cranial nerves II through XII are grossly intact. Proprioception is intact. No peripheral adenopathy or edema is identified. No motor or sensory levels are noted. Crude visual fields are within normal range.  RADIOLOGY RESULTS: No current films to review  PLAN: Present time patient is recovering well from her radiation therapy treatments she continues on Femara without side effect.  Patient knows to call with any concerns.  I have asked to see her back in 4 to 5 months for follow-up.  I would like to take this opportunity to thank you for allowing me to participate in the care of your patient.Noreene Filbert, MD

## 2021-09-16 NOTE — Therapy (Signed)
[facility] [address] 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 Wide excision/ RT, T1a,N0. ER/PR pos, Her 2.neg. Tamoxifen x 6 months, d/c secondary to vasomotor sympotms.    Headache    Hypertension    Personal history of radiation therapy 2016   RIGHT lumpectomy   Thyroid disease     Past Surgical History:  Procedure Laterality Date   ANTERIOR CRUCIATE LIGAMENT REPAIR Right    BREAST BIOPSY Right 07/17/2015   INVASIVE LOBULAR CARCINOMA, CLASSIC TYPE (1.5 MM), ARISING IN A    BREAST BIOPSY Left 09/19/2020   [place] bx 3 areas/ 1oc q clip/ 12 oc vision clip, axilla lymph node hydromark shape 3/ path pending   BREAST LUMPECTOMY Right 08/22/2015   BREAST LUMPECTOMY WITH SENTINEL LYMPH NODE BIOPSY Right 08/22/2015   Procedure: BREAST LUMPECTOMY WITH SENTINEL LYMPH NODE BX;  Surgeon: Christene Lye, MD;  Location: [facility];  Service: General;  Laterality: Right;   COLONOSCOPY W/ POLYPECTOMY  2014   COLONOSCOPY WITH PROPOFOL N/A 04/16/2019   Procedure: COLONOSCOPY WITH BIOPSIES;  Surgeon: Lucilla Lame, MD;  Location: [facility];  Service: Endoscopy;  Laterality: N/A;   DILATION AND CURETTAGE OF UTERUS     20 years ago   [place]     POLYPECTOMY N/A 04/16/2019   Procedure: POLYPECTOMY;  Surgeon: Lucilla Lame, MD;  Location: [facility];  Service: Endoscopy;  Laterality: N/A;   PORTACATH PLACEMENT Right 12/01/2020   Procedure: INSERTION PORT-A-CATH;  Surgeon: [name], MD;   Location: [facility];  Service: General;  Laterality: Right;   TOTAL MASTECTOMY Left 05/06/2021   Procedure: modified radical mastectomy;  Surgeon: [name], MD;  Location: [facility];  Service: General;  Laterality: Left;  Provider requesting 1.5 hours / 90 minutes for procedure    There were no vitals filed for this visit.   Subjective Assessment - 09/16/21 0950     Subjective  I am doing good- but not working at the moment - my R hip just is bothering me - had shot and was allergic to it -and now more numbness and pain going down my leg- has ortho appt 09/22/21 - My arm doing  better- still stiff and stretch -but doing my exercises when I can - I am wearing my jovipak breastpad if needed for that swelling under my arm    Currently in Pain? Yes    Pain Score 8     Pain Location Hip    Pain Orientation Right    Pain Descriptors / Indicators Aching                 LYMPHEDEMA/ONCOLOGY QUESTIONNAIRE - 09/16/21 0001       Right Upper Extremity Lymphedema   15 cm Proximal to Olecranon Process 31 cm    10 cm Proximal to Olecranon Process 28 cm    Olecranon Process 25.5 cm    15 cm Proximal to Ulnar Styloid  Process 25 cm      Left Upper Extremity Lymphedema   15 cm Proximal to Olecranon Process 30 cm    10 cm Proximal to Olecranon Process 28.5 cm    Olecranon Process 26 cm    15 cm Proximal to Ulnar Styloid Process 24.7 cm             Assessment and plan -Dr. [name], MD, MPH 09/04/2021 Patient is a 60 y.o. 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  she is s/p 4 cycles of dose dense AC and 12 weekly cycles of taxol neoadjuvant chemotherapy.  Patient underwent left mastectomy without reconstruction and final pathology showed multifocal invasive mammary carcinoma [id number].  Patient has completed adjuvant radiation treatment and this is a  routine follow-up visit   Bone density scan in September 2022 was normal.  Patient was supposed to start letrozole after completing radiation treatment but she did not pick up her prescription from the pharmacy and will do so today.  I have reemphasized the importance of taking letrozole along with calcium and vitamin D given that she has had recurrent breast cancer after she came off hormone therapy the first time.  I will see her back in 1 month after she starts taking letrozole.   Given that she had high risk breast cancer enough to warrant adjuvant chemotherapy she would also benefit from adjuvant Zometa as per ASCO guidelines it can be given either every 3 months for 2 years then every 6 months for 3 years.  Discussed risks and benefits of Zometa including all but not limited to possible risk of hypocalcemia and osteonecrosis of the jaw.  Patient understands and agrees to proceed as planned.  We will obtain dental clearance before proceeding with Zometa that is obtained she will get her first dose in 1 month as well.   Polycythemia: Suspect it is secondary due to smoking.  We will check EPO and JAK2 levels along with CMP at next visit   Chemo induced peripheral neuropathy: We will start her on Cymbalta 30 mg once a day for a week and following that increase to 60 mg.   Visit Diagnosis 1. Malignant neoplasm of areola of left breast in female, estrogen receptor positive ([place])   2. Polycythemia, secondary   3. Chemotherapy-induced peripheral neuropathy ([place])          OT SCREEN 06/10/21: Patient is concerned about a pocket of fat which is protruding at the lateral end of her mastectomy scar which rubs against her arm and makes it difficult for her to sleep.Refer to OT Pt assess this date - mastectomy scar healing well - but pt show increase stiffness and decrease AROM in L shoulder ABD and flexion more than ext and int rotation  Pt with pocket of swelling under arm - pt report night time down but  increase during day and rub against her inner arm and gets tender  Pt ed on Lymphedema - symptoms and precautions- hand out provided   Pt to wear her bra over the area for compression and discuss with surgeon tomorrow if she can benefit from Unilateral post mastectomy jovipak breast pad to decrease pocket under her arm. 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  Ask if anything that can help Breast Cancer navigator contacted -and provided info on chiropractor and refer to get some acupuncture - they will contact her to setup  Patient  report that the pocked of swelling under her arm is staying about the same - she is wearing bra for compression and will pick up Unilateral post mastectomy jovipak breast pad today.   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  Ask if anything that can help Breast Cancer navigator contacted  last time-and provided info on chiropractor and refer to get some acupuncture - she report that they did contact her and she has appt tomorrow.   Patient  report that the pocked of swelling under her arm is staying about the same - she is wearing bra for compression and did pick up Unilateral post mastectomy jovipak breast pad last week and wearing it at night mostly.   Pt ed on Lymphedema - symptoms and precautions- hand out provided with first appt  Pt to  cont to wear her bra over the area for compression and Unilateral post mastectomy jovipak breast pad to decrease pocket under her arm. But with radiation need to stop wearing it if her skin gets to sensitive. Pt to use camisole instead for compression bra at night .   Pt cont to show decrease shoulder  ABD 125 more than flexion 140-  cont to show cording in axilla and interior upper arm -  improved some last time and was able to get one pop today - done only about 2 min of soft tissue- did not want to irritate or cause pain - pain was less with techniques today compare to last week cording now more in axilla but still going some into interior upper arm. 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  Work is increasing to 6 days a week this week -and then she did start the chiropractor for her neuropathy  in feet.    Patient  report that the pocked of swelling under her arm is  not as hard - and she is using lighter sports bra with the  Unilateral post mastectomy jovipak breast pad  Still tolerating good.    Pt to  cont to wear her bra over the area for compression and  Unilateral post mastectomy jovipak breast pad to decrease pocket under her arm. But reinforce again that with radiation need to stop wearing it if her skin gets to sensitive.   Pt cont to show decrease L shoulder AROM  but improved to ABD 135 more than flexion 140-  cont to show cording in axilla  but not going into upper arm as before. Improving but not able to do as much soft tissue because of radiation - pt to cont to do some soft tissue in axilla to tolerance and ROM -and follow up in 2 wks  compare to last week cording now more in axilla but still going some into interior upper arm. 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 Circumference of bilateral UE increase this date on L  over bicep - pt report lifting and holding objects at work more in L arm - appear her bicep more developed on the L - will cont to monitor - pt to return in 2 wks   OT SCREEN 08/05/21:      Pt seen month ago -has 5 more radiation session left  - shoulder motion about the same and and cording in L axilla- tender from radiation cannot do to much soft tissue or manual- but pt encourage to cont AAROM for shoulder flexion ,ABD and ext rotation  Work is increasing to 6-7 days a week this week  but still on the lighter duty  Done her 5 sessions with chiropractor for her neuropathy in feet. Little better- burning pain better and more feeling in her toes -schedule 2 more sessions   Patient  report that the pocked of swelling under her arm is  not as hard - and she is using lighter sports bra with the  Unilateral post mastectomy jovipak breast pad  if she can tolerate it       Cont to show cording in axilla  but not going into upper arm as before. 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  Slight pull in axilla and pect - but very light  Cording improved greatly Circumference in L UE - staying steady -WNL compare to the R - pt to cont to wear her Jovipak breast pad as needed because of pocket of swelling on chest wall  Pt ed on 2 ext rotation and pect stretches with L shoulder ABD and flexion in side lying and supine  Pt to cont at home with HEP -and can contact me if needed.  Has appt with Ortho about her R hip pain  Visit Diagnosis: Scar condition and fibrosis of skin  Stiffness of left shoulder, not elsewhere classified    Problem List Patient Active Problem List   Diagnosis Date Noted   Port-A-Cath in place 06/11/2021   S/P mastectomy, left 05/12/2021   Breast cancer metastasized to axillary lymph node, right ([place]) 05/06/2021   Prophylaxis for chemotherapy-induced neutropenia 01/16/2021   Genetic testing 11/26/2020   Goals of care, counseling/discussion 11/04/2020   Breast cancer, left ([place]) 11/04/2020   History of colonic polyps    Polyp of ascending colon    Depression 09/20/2017   DM2 (diabetes mellitus, type 2) ([place]) 09/20/2017   Gout 09/20/2017   HTN (hypertension) 09/20/2017   Migraine 09/20/2017   History of endometrial ablation 02/26/2016   Malignant neoplasm of upper-outer quadrant of left breast in female, estrogen receptor positive ([place]) 08/04/2015    Rosalyn Gess, OTR/L,CLT 09/16/2021, 9:53 AM  [facility] [address] [place], [place], [address] Phone: [phone]   Fax:   [phone]  Name: HILDY NICHOLL MRN: [id number] Date of Birth: Dec 28, 1960

## 2021-09-22 ENCOUNTER — Inpatient Hospital Stay: Payer: 59

## 2021-09-22 ENCOUNTER — Ambulatory Visit: Payer: 59

## 2021-09-22 ENCOUNTER — Other Ambulatory Visit: Payer: Self-pay

## 2021-09-22 VITALS — BP 155/99 | HR 99 | Temp 97.9°F

## 2021-09-22 DIAGNOSIS — C50812 Malignant neoplasm of overlapping sites of left female breast: Secondary | ICD-10-CM

## 2021-09-22 DIAGNOSIS — C50012 Malignant neoplasm of nipple and areola, left female breast: Secondary | ICD-10-CM

## 2021-09-22 DIAGNOSIS — Z170 Estrogen receptor positive status [ER+]: Secondary | ICD-10-CM

## 2021-09-22 LAB — CBC WITH DIFFERENTIAL/PLATELET
Abs Immature Granulocytes: 0.05 10*3/uL (ref 0.00–0.07)
Basophils Absolute: 0 10*3/uL (ref 0.0–0.1)
Basophils Relative: 0 %
Eosinophils Absolute: 0 10*3/uL (ref 0.0–0.5)
Eosinophils Relative: 0 %
HCT: 48.5 % — ABNORMAL HIGH (ref 36.0–46.0)
Hemoglobin: 16.1 g/dL — ABNORMAL HIGH (ref 12.0–15.0)
Immature Granulocytes: 1 %
Lymphocytes Relative: 11 %
Lymphs Abs: 0.7 10*3/uL (ref 0.7–4.0)
MCH: 27.2 pg (ref 26.0–34.0)
MCHC: 33.2 g/dL (ref 30.0–36.0)
MCV: 82.1 fL (ref 80.0–100.0)
Monocytes Absolute: 0.5 10*3/uL (ref 0.1–1.0)
Monocytes Relative: 8 %
Neutro Abs: 4.6 10*3/uL (ref 1.7–7.7)
Neutrophils Relative %: 80 %
Platelets: 244 10*3/uL (ref 150–400)
RBC: 5.91 MIL/uL — ABNORMAL HIGH (ref 3.87–5.11)
RDW: 14.7 % (ref 11.5–15.5)
WBC: 5.8 10*3/uL (ref 4.0–10.5)
nRBC: 0 % (ref 0.0–0.2)

## 2021-09-22 LAB — COMPREHENSIVE METABOLIC PANEL
ALT: 16 U/L (ref 0–44)
AST: 22 U/L (ref 15–41)
Albumin: 3.6 g/dL (ref 3.5–5.0)
Alkaline Phosphatase: 71 U/L (ref 38–126)
Anion gap: 8 (ref 5–15)
BUN: 14 mg/dL (ref 6–20)
CO2: 29 mmol/L (ref 22–32)
Calcium: 9.7 mg/dL (ref 8.9–10.3)
Chloride: 96 mmol/L — ABNORMAL LOW (ref 98–111)
Creatinine, Ser: 0.75 mg/dL (ref 0.44–1.00)
GFR, Estimated: >60 mL/min (ref >60)
Glucose, Bld: 119 mg/dL — ABNORMAL HIGH (ref 70–99)
Potassium: 3.7 mmol/L (ref 3.5–5.1)
Sodium: 133 mmol/L — ABNORMAL LOW (ref 135–145)
Total Bilirubin: 0.5 mg/dL (ref 0.3–1.2)
Total Protein: 7.4 g/dL (ref 6.5–8.1)

## 2021-09-22 MED ORDER — ZOLEDRONIC ACID 4 MG/100ML IV SOLN
4.0000 mg | INTRAVENOUS | Status: DC
  Administered 2021-09-22: 4 mg via INTRAVENOUS
  Filled 2021-09-22: qty 100

## 2021-09-22 MED ORDER — SODIUM CHLORIDE 0.9 % IV SOLN
Freq: Once | INTRAVENOUS | Status: AC
  Filled 2021-09-22: qty 250

## 2021-09-22 NOTE — Patient Instructions (Signed)

## 2021-10-07 ENCOUNTER — Encounter: Payer: Self-pay | Admitting: Oncology

## 2021-10-07 ENCOUNTER — Other Ambulatory Visit: Payer: Self-pay | Admitting: *Deleted

## 2021-10-07 ENCOUNTER — Ambulatory Visit: Payer: 59

## 2021-10-07 ENCOUNTER — Inpatient Hospital Stay (HOSPITAL_BASED_OUTPATIENT_CLINIC_OR_DEPARTMENT_OTHER): Payer: 59 | Admitting: Oncology

## 2021-10-07 ENCOUNTER — Other Ambulatory Visit: Payer: Self-pay

## 2021-10-07 ENCOUNTER — Inpatient Hospital Stay: Payer: 59 | Attending: Oncology

## 2021-10-07 VITALS — BP 138/96 | HR 102 | Temp 98.5°F | Resp 18 | Wt 168.9 lb

## 2021-10-07 DIAGNOSIS — C50812 Malignant neoplasm of overlapping sites of left female breast: Secondary | ICD-10-CM

## 2021-10-07 DIAGNOSIS — C50012 Malignant neoplasm of nipple and areola, left female breast: Secondary | ICD-10-CM | POA: Diagnosis not present

## 2021-10-07 DIAGNOSIS — Z170 Estrogen receptor positive status [ER+]: Secondary | ICD-10-CM | POA: Insufficient documentation

## 2021-10-07 DIAGNOSIS — I10 Essential (primary) hypertension: Secondary | ICD-10-CM | POA: Diagnosis not present

## 2021-10-07 DIAGNOSIS — D751 Secondary polycythemia: Secondary | ICD-10-CM

## 2021-10-07 DIAGNOSIS — G620 Drug-induced polyneuropathy: Secondary | ICD-10-CM

## 2021-10-07 DIAGNOSIS — F17210 Nicotine dependence, cigarettes, uncomplicated: Secondary | ICD-10-CM | POA: Insufficient documentation

## 2021-10-07 DIAGNOSIS — T451X5A Adverse effect of antineoplastic and immunosuppressive drugs, initial encounter: Secondary | ICD-10-CM

## 2021-10-07 LAB — CBC WITH DIFFERENTIAL/PLATELET
Abs Immature Granulocytes: 0.02 10*3/uL (ref 0.00–0.07)
Basophils Absolute: 0 10*3/uL (ref 0.0–0.1)
Basophils Relative: 1 %
Eosinophils Absolute: 0.1 10*3/uL (ref 0.0–0.5)
Eosinophils Relative: 3 %
HCT: 45.7 % (ref 36.0–46.0)
Hemoglobin: 15 g/dL (ref 12.0–15.0)
Immature Granulocytes: 1 %
Lymphocytes Relative: 21 %
Lymphs Abs: 0.8 10*3/uL (ref 0.7–4.0)
MCH: 27.1 pg (ref 26.0–34.0)
MCHC: 32.8 g/dL (ref 30.0–36.0)
MCV: 82.6 fL (ref 80.0–100.0)
Monocytes Absolute: 0.4 10*3/uL (ref 0.1–1.0)
Monocytes Relative: 10 %
Neutro Abs: 2.5 10*3/uL (ref 1.7–7.7)
Neutrophils Relative %: 64 %
Platelets: 187 10*3/uL (ref 150–400)
RBC: 5.53 MIL/uL — ABNORMAL HIGH (ref 3.87–5.11)
RDW: 14 % (ref 11.5–15.5)
WBC: 3.9 10*3/uL — ABNORMAL LOW (ref 4.0–10.5)
nRBC: 0 % (ref 0.0–0.2)

## 2021-10-07 LAB — COMPREHENSIVE METABOLIC PANEL
ALT: 12 U/L (ref 0–44)
AST: 17 U/L (ref 15–41)
Albumin: 3.8 g/dL (ref 3.5–5.0)
Alkaline Phosphatase: 79 U/L (ref 38–126)
Anion gap: 11 (ref 5–15)
BUN: 15 mg/dL (ref 6–20)
CO2: 30 mmol/L (ref 22–32)
Calcium: 10.1 mg/dL (ref 8.9–10.3)
Chloride: 95 mmol/L — ABNORMAL LOW (ref 98–111)
Creatinine, Ser: 0.64 mg/dL (ref 0.44–1.00)
GFR, Estimated: >60 mL/min (ref >60)
Glucose, Bld: 110 mg/dL — ABNORMAL HIGH (ref 70–99)
Potassium: 3.2 mmol/L — ABNORMAL LOW (ref 3.5–5.1)
Sodium: 136 mmol/L (ref 135–145)
Total Bilirubin: 0.6 mg/dL (ref 0.3–1.2)
Total Protein: 7.3 g/dL (ref 6.5–8.1)

## 2021-10-07 MED ORDER — GABAPENTIN 300 MG PO CAPS: 300.0000 mg | ORAL_CAPSULE | ORAL | 0 refills | Status: DC

## 2021-10-07 NOTE — Progress Notes (Signed)
BP dropped last week at work and got very dizzy, sweating. Lots of pain in left leg due to neuropathy. Pt states that the DULOXETINE doe snot seem to be helping at all for the neuropathy. Had to get two shots in her hip; found arthritis on her x-ray.

## 2021-10-08 ENCOUNTER — Encounter: Payer: Self-pay | Admitting: Oncology

## 2021-10-08 NOTE — Progress Notes (Signed)
Hematology/Oncology Consult note [facility]  [phone] [phone]  Patient Care Team: Casilda Carls, MD as PCP - General (Internal Medicine) Casilda Carls, MD as Consulting Physician (Internal Medicine) Christene Lye, MD (General Surgery) Garrel Ridgel, [place] as Consulting Physician (Podiatry) Sindy Guadeloupe, MD as Consulting Physician (Hematology and Oncology)   Name of the patient: Michelle Walker  [id number]  02-25-1961   Date of visit: 10/08/21  Diagnosis-  recurrent left breast cancer stage II [id number] ER/PR positive HER-2 negative    Chief complaint/ Reason for visit- routine f/u of breast cancer and peripheral neuropathy  Heme/Onc history: Patient is a 60 year old female who was diagnosed with 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  She did not require adjuvant chemotherapy.  She was initially on tamoxifen which she could not tolerate and was switched to letrozole.  Last seen by me in June 2019 at which point she had a menstrual cycle and therefore not given another AI.  She had subsequently did not follow-up with me and remained off hormone therapy.     More recently patient underwent a diagnostic bilateral mammogram which showed a 2.2 x 1.9 x 1.9 cm mass at the 12 o'clock position of the left breast 1 cm from the nipple.  She was also found to have 5 other smaller breast masses ranging from 0.3 to 0.8 cm.  Noted to have a solitary left axillary lymph node with cortical thickening.  She had a biopsy of the dominant left breast mass as well as another smaller breast mass and the left axillary lymph node all of which were positive for metastatic invasive mammary carcinoma grade 2.  Tumor was ER 91 200% positive PR 71 to 80% positive and HER-2 IHC equivocal but negative by FISH   MRI of the bilateral breasts showed no abnormal findings in the right  breast.  At least 6 discrete hypoechoic masses in the left breast with the largest one measuring 2.2 cm with 1 abnormal left axillary lymph node.  The size of the other breast masses ranging from 61m to 1.4 cm.  The total area of masses and non-mass enhancement was about 10 x 5 x 6.5 cm.  CT chest abdomen and pelvis with contrast showed no evidence of distant metastatic disease.  Bone scan was also negative.   MammaPrint done on the biopsy specimen came back as high risk with greater than 12% chemotherapy benefit.  Plan is for neoadjuvant dose dense AC-Taxol chemotherapy followed by surgery.  Start of chemotherapy was delayed since patient tested positive for Covid   Patient completed neoadjuvant ddAC- taxol chemo between feb-June 2022.    Patient underwent left mastectomy with axillary lymph node dissection.  Final pathology showed 12 mm multifocal invasive mammary carcinoma at least 3 of them in number overall grade 2 with negative margins.  Metastatic carcinoma involving 3 out of 12 lymph nodes.  No extranodal extension identified.  Largest site of metastatic deposit 3 mm.  pT1c pN1a   Patient completed adjuvant radiation therapy in October 2022    Interval history-reports that she still has significant neuropathy in her right lower extremity despite using Cymbalta.  She has trouble ambulating because of that.  She has had a fall once.  ECOG PS- 1 Pain scale- 5   Review of systems- Review of Systems  Constitutional:  Negative for chills, fever, malaise/fatigue and weight loss.  HENT:  Negative for congestion, ear discharge and nosebleeds.   Eyes:  Negative for blurred vision.  Respiratory:  Negative for cough, hemoptysis, sputum production, shortness of breath and wheezing.   Cardiovascular:  Negative for chest pain, palpitations, orthopnea and claudication.  Gastrointestinal:  Negative for abdominal pain, blood in stool, constipation, diarrhea, heartburn, melena, nausea and vomiting.   Genitourinary:  Negative for dysuria, flank pain, frequency, hematuria and urgency.  Musculoskeletal:  Negative for back pain, joint pain and myalgias.  Skin:  Negative for rash.  Neurological:  Positive for sensory change (peripheral neuropathy). Negative for dizziness, tingling, focal weakness, seizures, weakness and headaches.  Endo/Heme/Allergies:  Does not bruise/bleed easily.  Psychiatric/Behavioral:  Negative for depression and suicidal ideas. The patient does not have insomnia.       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 Wide excision/ RT, T1a,N0. ER/PR pos, Her 2.neg. Tamoxifen x 6 months, d/c secondary to vasomotor sympotms.    Headache    Hypertension    Personal history of radiation therapy 2016   RIGHT lumpectomy   Thyroid disease      Past Surgical History:  Procedure Laterality Date   ANTERIOR CRUCIATE LIGAMENT REPAIR Right    BREAST BIOPSY Right 07/17/2015   INVASIVE LOBULAR CARCINOMA, CLASSIC TYPE (1.5 MM), ARISING IN A    BREAST BIOPSY Left 09/19/2020   [place] bx 3 areas/ 1oc q clip/ 12 oc vision clip, axilla lymph node hydromark shape 3/ path pending   BREAST LUMPECTOMY Right 08/22/2015   BREAST LUMPECTOMY WITH SENTINEL LYMPH NODE BIOPSY Right 08/22/2015   Procedure: BREAST LUMPECTOMY WITH SENTINEL LYMPH NODE BX;  Surgeon: Christene Lye, MD;  Location: [facility];  Service: General;  Laterality: Right;   COLONOSCOPY W/ POLYPECTOMY  2014   COLONOSCOPY WITH PROPOFOL N/A 04/16/2019   Procedure: COLONOSCOPY WITH BIOPSIES;  Surgeon: Lucilla Lame, MD;  Location: [place];  Service: Endoscopy;  Laterality: N/A;   DILATION AND CURETTAGE OF UTERUS     20 years ago   [place]     POLYPECTOMY N/A 04/16/2019   Procedure: POLYPECTOMY;  Surgeon: Lucilla Lame, MD;  Location: [place];  Service: Endoscopy;  Laterality: N/A;   PORTACATH PLACEMENT Right 12/01/2020   Procedure: INSERTION PORT-A-CATH;  Surgeon: [name], MD;  Location: [facility];  Service: General;  Laterality: Right;   TOTAL MASTECTOMY Left 05/06/2021   Procedure: modified radical mastectomy;  Surgeon: [name], MD;  Location: [facility];  Service: General;  Laterality: Left;  Provider requesting 1.5 hours / 90 minutes for procedure    Social History   Socioeconomic History   Marital status: Married    Spouse name: Not on file   Number of children: Not on file   Years of education: Not on file   Highest education level: Not on file  Occupational History   Not on file  Tobacco Use   Smoking status: Every Day    Packs/day: 0.75    Years: 40.00    Pack years: 30.00    Types: Cigarettes   Smokeless tobacco: Never  Vaping Use   Vaping Use: Never used  Substance and Sexual Activity   Alcohol  use: Yes    Alcohol/week: 2.0 standard drinks    Types: 2 Standard drinks or equivalent per week    Comment: BEER 1-2 QD   Drug use: No   Sexual activity: Yes    Comment: ablation  Other Topics Concern   Not on file  Social History Narrative   Not on file   Social Determinants of Health   Financial Resource Strain: Not on file  Food Insecurity: Not on file  Transportation Needs: Not on file  Physical Activity: Not on file  Stress: Not on file  Social Connections: Not on file  Intimate Partner Violence: Not on file    Family History  Problem Relation Age of Onset   Stroke Mother    Cancer Sister        sarcoma on arm; chemo   Diabetes Sister    Stroke Sister    Diabetes Brother    Breast cancer Neg Hx    Ovarian cancer Neg Hx    Colon cancer Neg Hx    Heart disease Neg Hx      Current Outpatient Medications:    allopurinol (ZYLOPRIM) 100 MG tablet, Take  100 mg by mouth at bedtime., Disp: , Rfl:    amLODipine (NORVASC) 10 MG tablet, Take 10 mg by mouth at bedtime. , Disp: , Rfl: 0   Calcium Carb-Cholecalciferol (CALCIUM 600+D3 PO), Take 1 tablet by mouth at bedtime., Disp: , Rfl:    DULoxetine (CYMBALTA) 30 MG capsule, Take 1 capsule (30 mg total) by mouth daily. 1 capsule a day for 7 days and then 2 tablets daily for the rest of prescription, Disp: 53 capsule, Rfl: 0   hydrochlorothiazide (HYDRODIURIL) 25 MG tablet, Take 25 mg by mouth daily., Disp: , Rfl:    letrozole (FEMARA) 2.5 MG tablet, Take 1 tablet (2.5 mg total) by mouth daily., Disp: 30 tablet, Rfl: 3   losartan (COZAAR) 100 MG tablet, Take 1 tablet by mouth daily., Disp: , Rfl:    metoprolol succinate (TOPROL-XL) 25 MG 24 hr tablet, Take 50 mg by mouth at bedtime., Disp: , Rfl: 0   gabapentin (NEURONTIN) 300 MG capsule, Take 1 capsule (300 mg total) by mouth as directed. 1 tablet at night for 3 days, then 1 tablet in am and 1 at night for 3 days, then take 1 tablet three times day for rest of prescription, Disp: 81 capsule, Rfl: 0   PROVENTIL HFA 108 (90 Base) MCG/ACT inhaler, Inhale 2 puffs into the lungs every 6 (six) hours as needed for shortness of breath or wheezing. (Patient not taking: Reported on 10/07/2021), Disp: , Rfl:  No current facility-administered medications for this visit.  Facility-Administered Medications Ordered in Other Visits:    sodium chloride flush (NS) 0.9 % injection 10 mL, 10 mL, Intravenous, PRN, Sindy Guadeloupe, MD, 10 mL at 03/27/21 0813  Physical exam:  Vitals:   10/07/21 1406  BP: (!) 138/96  Pulse: (!) 102  Resp: 18  Temp: 98.5 F (36.9 C)  SpO2: 98%  Weight: 168 lb 14.4 oz (76.6 kg)   Physical Exam Constitutional:      General: She is not in acute distress. Cardiovascular:     Rate and Rhythm: Normal rate and regular rhythm.     Heart sounds: Normal heart sounds.  Pulmonary:     Effort: Pulmonary effort is normal.     Breath sounds:  Normal breath sounds.  Abdominal:     General: Bowel sounds are normal.  Palpations: Abdomen is soft.  Skin:    General: Skin is warm and dry.  Neurological:     Mental Status: She is alert and oriented to person, place, and time.     CMP Latest Ref Rng & Units 10/07/2021  Glucose 70 - 99 mg/dL 110(H)  BUN 6 - 20 mg/dL 15  Creatinine 0.44 - 1.00 mg/dL 0.64  Sodium 135 - 145 mmol/L 136  Potassium 3.5 - 5.1 mmol/L 3.2(L)  Chloride 98 - 111 mmol/L 95(L)  CO2 22 - 32 mmol/L 30  Calcium 8.9 - 10.3 mg/dL 10.1  Total Protein 6.5 - 8.1 g/dL 7.3  Total Bilirubin 0.3 - 1.2 mg/dL 0.6  Alkaline Phos 38 - 126 U/L 79  AST 15 - 41 U/L 17  ALT 0 - 44 U/L 12   CBC Latest Ref Rng & Units 10/07/2021  WBC 4.0 - 10.5 K/uL 3.9(L)  Hemoglobin 12.0 - 15.0 g/dL 15.0  Hematocrit 36.0 - 46.0 % 45.7  Platelets 150 - 400 K/uL 187    Assessment and plan- Patient is a 60 y.o. 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  she is s/p 4 cycles of dose dense AC and 12 weekly cycles of taxol neoadjuvant chemotherapy.  Patient underwent left mastectomy without reconstruction and final pathology showed multifocal invasive mammary carcinoma [id number].  Patient has completed adjuvant radiation treatment.  She is currently on letrozole and will have a routine follow-up visit  Clinically patient is doing well with no concerning signs and symptoms of recurrence based on today's exam.  She would be due forScreening right breast mammogram at this time which we will order.   Patient will continue letrozole along with calcium and vitamin D.  Chemo induced peripheral neuropathy: Patient is currently on Cymbalta 60 mg daily which is not controlling her symptoms.  I have added gabapentin to her regimen which she will take 300 mg QHS and slowly increase to3 times a day.  She will see NP Altha Harm in  6 weeks to see how her neuropathy is doing.  If symptoms are gradually improving we can taper her off Cymbalta and gradually go up on the dose of gabapentin.  I will see her back in 3 months for her next dose of Zometa and follow-up of her neuropathy   Visit Diagnosis 1. Malignant neoplasm of areola of left breast in female, estrogen receptor positive ([place])   2. Chemotherapy-induced peripheral neuropathy ([place])   3. Malignant neoplasm of overlapping sites of left breast in female, estrogen receptor positive ([place])      Dr. [name], MD, MPH [facility] [phone] 10/08/2021 7:33 AM

## 2021-10-09 ENCOUNTER — Other Ambulatory Visit: Payer: Self-pay

## 2021-10-09 DIAGNOSIS — Z170 Estrogen receptor positive status [ER+]: Secondary | ICD-10-CM

## 2021-10-09 LAB — ERYTHROPOIETIN: Erythropoietin: 8.9 m[IU]/mL (ref 2.6–18.5)

## 2021-10-13 LAB — JAK2 GENOTYPR

## 2021-10-14 ENCOUNTER — Other Ambulatory Visit: Payer: Self-pay | Admitting: Oncology

## 2021-10-14 ENCOUNTER — Other Ambulatory Visit: Payer: Self-pay | Admitting: *Deleted

## 2021-10-14 MED ORDER — DULOXETINE HCL 60 MG PO CPEP: 60.0000 mg | ORAL_CAPSULE | Freq: Every day | ORAL | 0 refills | Status: DC

## 2021-10-14 NOTE — Telephone Encounter (Addendum)
Assessment and plan- Patient is a 60 y.o. 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  she is s/p 4 cycles of dose dense AC and 12 weekly cycles of taxol neoadjuvant chemotherapy.  Patient underwent left mastectomy without reconstruction and final pathology showed multifocal invasive mammary carcinoma [id number].  Patient has completed adjuvant radiation treatment.  She is currently on letrozole and will have a routine follow-up visit   Clinically patient is doing well with no concerning signs and symptoms of recurrence based on today's exam.  She would be due forScreening right breast mammogram at this time which we will order.   Patient will continue letrozole along with calcium and vitamin D.  Chemo induced peripheral neuropathy: Patient is currently on Cymbalta 60 mg daily which is not controlling her symptoms.  I have added gabapentin to her regimen which she will take 300 mg QHS and slowly increase to3 times a day.  She will see NP Altha Harm in 6 weeks to see how her neuropathy is doing.  If symptoms are gradually improving we can taper her off Cymbalta and gradually go up on the dose of gabapentin.   I will see her back in 3 months for her next dose of Zometa and follow-up of her neuropathy     Visit Diagnosis 1. Malignant neoplasm of areola of left breast in female, estrogen receptor positive ([place])   2. Chemotherapy-induced peripheral neuropathy ([place])   3. Malignant neoplasm of overlapping sites of left breast in female, estrogen receptor positive ([place])         Dr. [name], MD, MPH [facility] [phone] 10/08/2021 7:33 AM

## 2021-10-27 ENCOUNTER — Ambulatory Visit
Admission: RE | Admit: 2021-10-27 | Discharge: 2021-10-27 | Disposition: A | Discharge status: Home / Self Care | Payer: 59 | Source: Ambulatory Visit | Attending: Oncology | Admitting: Oncology

## 2021-10-27 ENCOUNTER — Other Ambulatory Visit: Payer: Self-pay

## 2021-10-27 DIAGNOSIS — Z9012 Acquired absence of left breast and nipple: Secondary | ICD-10-CM | POA: Diagnosis not present

## 2021-10-27 DIAGNOSIS — Z853 Personal history of malignant neoplasm of breast: Secondary | ICD-10-CM | POA: Insufficient documentation

## 2021-10-27 DIAGNOSIS — Z1231 Encounter for screening mammogram for malignant neoplasm of breast: Secondary | ICD-10-CM | POA: Diagnosis not present

## 2021-10-27 DIAGNOSIS — Z170 Estrogen receptor positive status [ER+]: Secondary | ICD-10-CM

## 2021-11-01 IMAGING — US US BREAST*L* LIMITED INC AXILLA
1 series · 13 of 25 positions shown · non-contrast
Comparison: Previous exam(s).

CLINICAL DATA: Follow-up multicentric left breast cancer and
metastatic left axillary adenopathy, status post neoadjuvant
chemotherapy.

EXAM:
ULTRASOUND OF THE LEFT BREAST

[Series 1: us breast*left* limited inc axilla · 0.06mm/px · 13 of 35 slices shown]
[im 1/35]
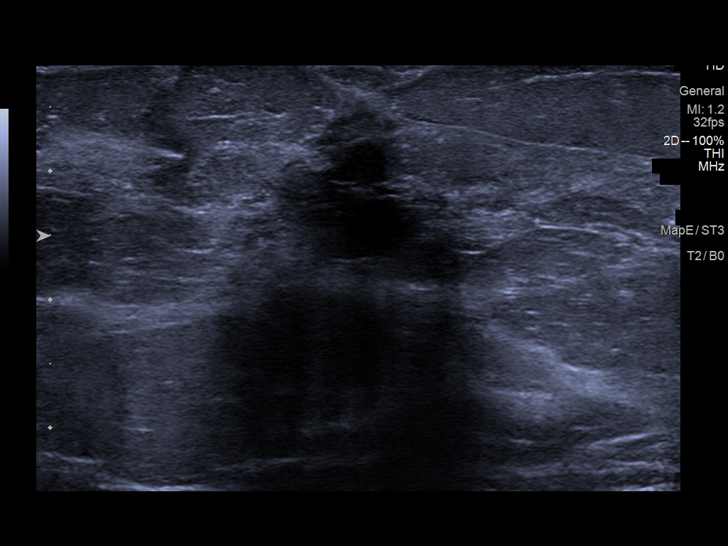
[im 3/35]
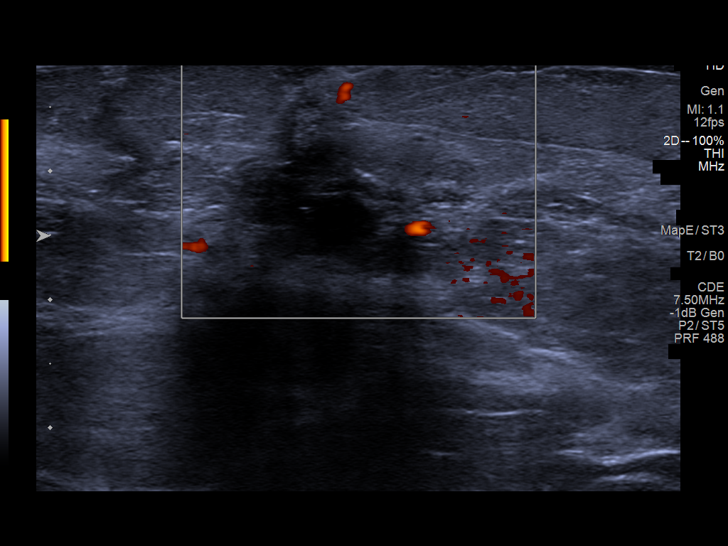
[im 6/35]
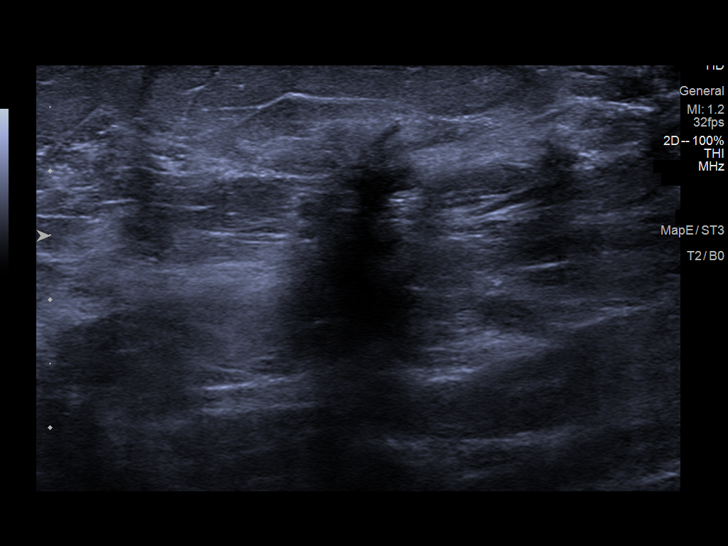
[im 9/35]
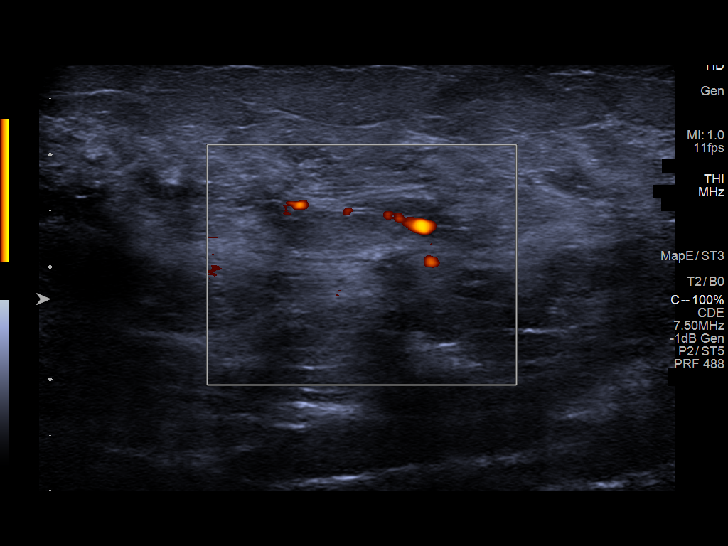
[im 12/35]
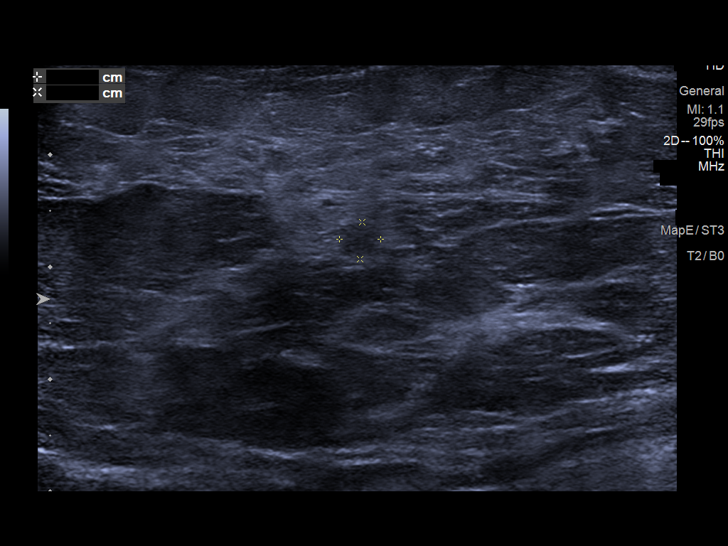
[im 15/35]
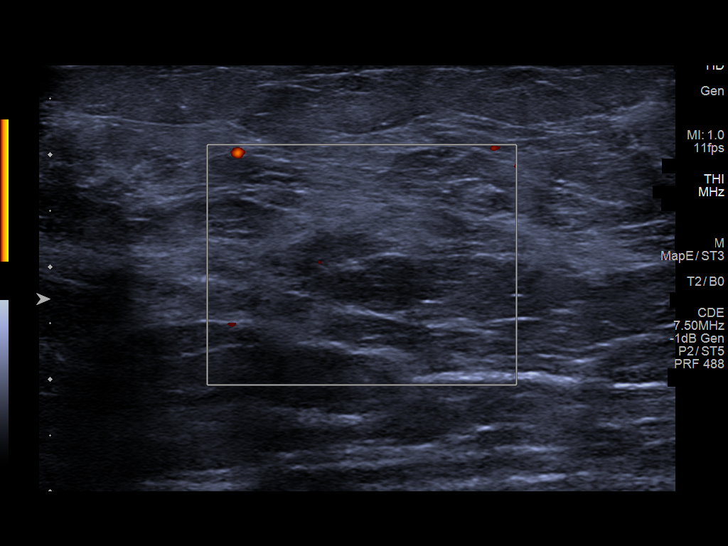
[im 18/35]
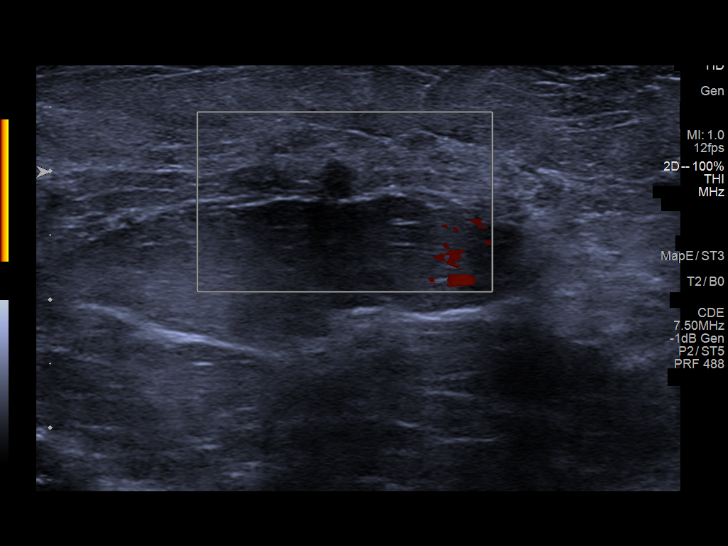
[im 20/35]
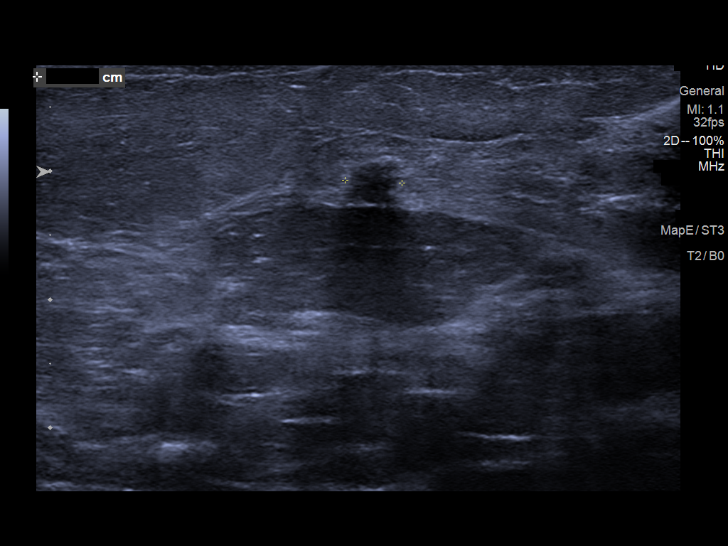
[im 23/35]
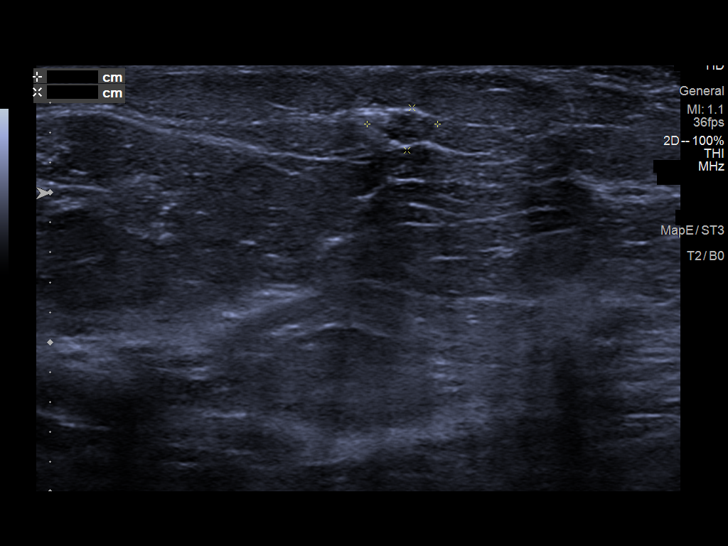
[im 26/35]
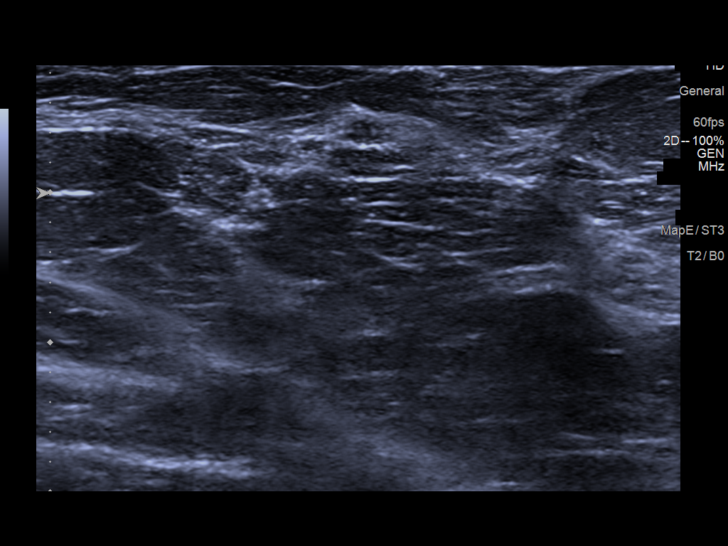
[im 29/35]
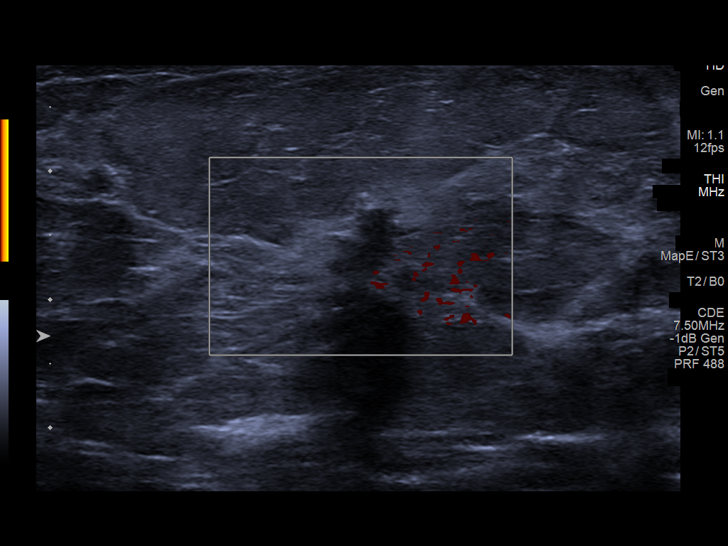
[im 32/35]
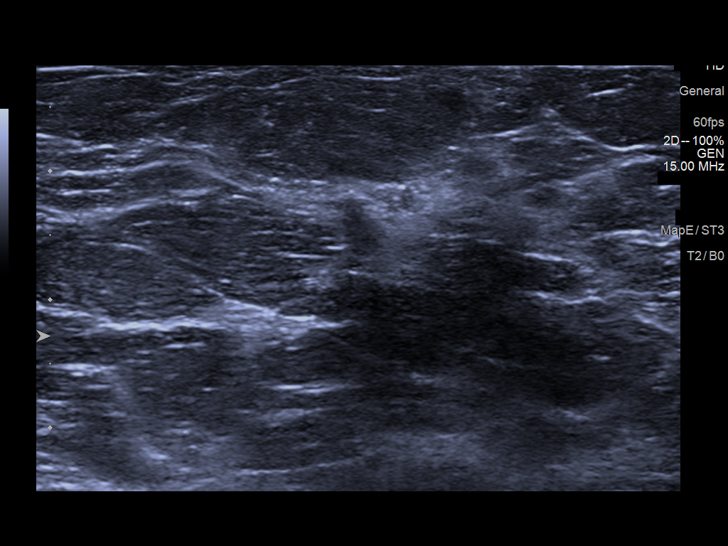
[im 35/35]
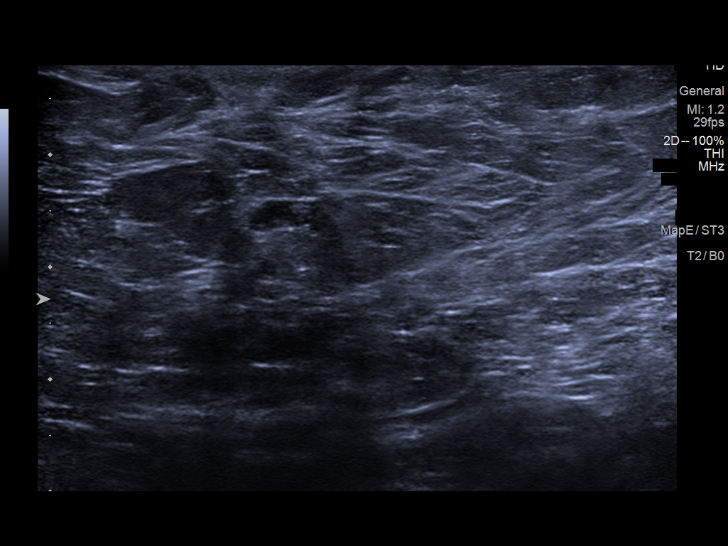

[13 of 25 positions shown; findings below may reference images not displayed]

FINDINGS: Targeted ultrasound is performed, showing a mild interval decrease
in size of multiple irregular, hypoechoic left breast masses since
09/15/2020. One single mass is slightly larger and one is unchanged.
Measurements are summarized as follows:

12 o'clock 1 cm from the nipple: 1.6 x 1.5 x 1.0 cm, previously
x 1.9 x 1.9 cm

1 o'clock 3 cm from the nipple: 0.7 x 0.5 x 0.4 cm, previously 0.8 x
0.7 x 0.5 cm

1 o'clock 5 cm from the nipple: 0.4 x 0.3 x 0.3 cm, previously 0.4 x
0.4 x 0.3 cm

12 o'clock 5 cm from the nipple: 0.4 x 0.4 x 0.3 cm, previously
x 0.4 x 0.3 cm

11 o'clock 5 cm from the nipple: 0.5 x 0.4 x 0.3 cm, previously
x 0.4 x 0.3 cm

12 o'clock 6 cm from the nipple: 0.6 x 0.6 x 0.5 cm, previously
x 0.6 x 0.3 cm

Left axillary lymph node: 2.4 mm maximum cortical thickness,
previously 4.1 mm
IMPRESSION: 1. Mild interval decrease in size of 4 left breast masses, stable
size of 1 left breast mass and mild increase in size of 1 left
breast mass.
2. Decreased size of a metastatic left axillary lymph node.

RECOMMENDATION:
Treatment plan.

I have discussed the findings and recommendations with the patient.
If applicable, a reminder letter will be sent to the patient
regarding the next appointment.

BI-RADS CATEGORY  6: Known biopsy-proven malignancy.

## 2021-11-18 ENCOUNTER — Other Ambulatory Visit: Payer: Self-pay | Admitting: *Deleted

## 2021-11-18 ENCOUNTER — Inpatient Hospital Stay: Payer: 59 | Admitting: Hospice and Palliative Medicine

## 2021-11-18 ENCOUNTER — Telehealth: Payer: Self-pay | Admitting: Hospice and Palliative Medicine

## 2021-11-18 MED ORDER — DULOXETINE HCL 60 MG PO CPEP: 60.0000 mg | ORAL_CAPSULE | Freq: Every day | ORAL | 1 refills | Status: DC

## 2021-11-18 NOTE — Telephone Encounter (Signed)
Pt called to reschedule her appt for today. Call back at [phone]

## 2021-11-26 LAB — HM PAP SMEAR

## 2021-12-08 ENCOUNTER — Encounter: Payer: Self-pay | Admitting: Internal Medicine

## 2021-12-08 NOTE — Progress Notes (Signed)
I routed it to him

## 2021-12-09 ENCOUNTER — Inpatient Hospital Stay: Payer: 59 | Attending: Oncology

## 2021-12-09 ENCOUNTER — Inpatient Hospital Stay: Payer: 59

## 2021-12-09 ENCOUNTER — Encounter: Payer: Self-pay | Admitting: Oncology

## 2021-12-09 ENCOUNTER — Other Ambulatory Visit: Payer: Self-pay

## 2021-12-09 ENCOUNTER — Inpatient Hospital Stay: Payer: 59 | Admitting: Hospice and Palliative Medicine

## 2021-12-09 ENCOUNTER — Inpatient Hospital Stay (HOSPITAL_BASED_OUTPATIENT_CLINIC_OR_DEPARTMENT_OTHER): Payer: 59 | Admitting: Oncology

## 2021-12-09 ENCOUNTER — Other Ambulatory Visit: Payer: Self-pay | Admitting: *Deleted

## 2021-12-09 VITALS — BP 136/86 | HR 109 | Temp 97.7°F | Resp 16 | Ht 66.0 in | Wt 178.7 lb

## 2021-12-09 DIAGNOSIS — Z5181 Encounter for therapeutic drug level monitoring: Secondary | ICD-10-CM

## 2021-12-09 DIAGNOSIS — G620 Drug-induced polyneuropathy: Secondary | ICD-10-CM

## 2021-12-09 DIAGNOSIS — F17210 Nicotine dependence, cigarettes, uncomplicated: Secondary | ICD-10-CM | POA: Diagnosis not present

## 2021-12-09 DIAGNOSIS — Z170 Estrogen receptor positive status [ER+]: Secondary | ICD-10-CM

## 2021-12-09 DIAGNOSIS — Z808 Family history of malignant neoplasm of other organs or systems: Secondary | ICD-10-CM | POA: Diagnosis not present

## 2021-12-09 DIAGNOSIS — Z7983 Long term (current) use of bisphosphonates: Secondary | ICD-10-CM

## 2021-12-09 DIAGNOSIS — C50912 Malignant neoplasm of unspecified site of left female breast: Secondary | ICD-10-CM | POA: Insufficient documentation

## 2021-12-09 DIAGNOSIS — C773 Secondary and unspecified malignant neoplasm of axilla and upper limb lymph nodes: Secondary | ICD-10-CM

## 2021-12-09 DIAGNOSIS — D751 Secondary polycythemia: Secondary | ICD-10-CM

## 2021-12-09 DIAGNOSIS — Z79811 Long term (current) use of aromatase inhibitors: Secondary | ICD-10-CM

## 2021-12-09 DIAGNOSIS — Z08 Encounter for follow-up examination after completed treatment for malignant neoplasm: Secondary | ICD-10-CM

## 2021-12-09 DIAGNOSIS — I10 Essential (primary) hypertension: Secondary | ICD-10-CM

## 2021-12-09 DIAGNOSIS — C50812 Malignant neoplasm of overlapping sites of left female breast: Secondary | ICD-10-CM | POA: Diagnosis not present

## 2021-12-09 DIAGNOSIS — T451X5A Adverse effect of antineoplastic and immunosuppressive drugs, initial encounter: Secondary | ICD-10-CM

## 2021-12-09 DIAGNOSIS — Z853 Personal history of malignant neoplasm of breast: Secondary | ICD-10-CM

## 2021-12-09 LAB — COMPREHENSIVE METABOLIC PANEL
ALT: 10 U/L (ref 0–44)
AST: 19 U/L (ref 15–41)
Albumin: 3.9 g/dL (ref 3.5–5.0)
Alkaline Phosphatase: 62 U/L (ref 38–126)
Anion gap: 10 (ref 5–15)
BUN: 11 mg/dL (ref 6–20)
CO2: 28 mmol/L (ref 22–32)
Calcium: 10.1 mg/dL (ref 8.9–10.3)
Chloride: 97 mmol/L — ABNORMAL LOW (ref 98–111)
Creatinine, Ser: 0.67 mg/dL (ref 0.44–1.00)
GFR, Estimated: >60 mL/min (ref >60)
Glucose, Bld: 116 mg/dL — ABNORMAL HIGH (ref 70–99)
Potassium: 3.2 mmol/L — ABNORMAL LOW (ref 3.5–5.1)
Sodium: 135 mmol/L (ref 135–145)
Total Bilirubin: 0.7 mg/dL (ref 0.3–1.2)
Total Protein: 7.7 g/dL (ref 6.5–8.1)

## 2021-12-09 LAB — CBC WITH DIFFERENTIAL/PLATELET
Abs Immature Granulocytes: 0.05 10*3/uL (ref 0.00–0.07)
Basophils Absolute: 0 10*3/uL (ref 0.0–0.1)
Basophils Relative: 0 %
Eosinophils Absolute: 0.1 10*3/uL (ref 0.0–0.5)
Eosinophils Relative: 2 %
HCT: 44.4 % (ref 36.0–46.0)
Hemoglobin: 14.3 g/dL (ref 12.0–15.0)
Immature Granulocytes: 1 %
Lymphocytes Relative: 21 %
Lymphs Abs: 1 10*3/uL (ref 0.7–4.0)
MCH: 27.7 pg (ref 26.0–34.0)
MCHC: 32.2 g/dL (ref 30.0–36.0)
MCV: 86 fL (ref 80.0–100.0)
Monocytes Absolute: 0.4 10*3/uL (ref 0.1–1.0)
Monocytes Relative: 9 %
Neutro Abs: 3.3 10*3/uL (ref 1.7–7.7)
Neutrophils Relative %: 67 %
Platelets: 182 10*3/uL (ref 150–400)
RBC: 5.16 MIL/uL — ABNORMAL HIGH (ref 3.87–5.11)
RDW: 14.6 % (ref 11.5–15.5)
WBC: 4.8 10*3/uL (ref 4.0–10.5)
nRBC: 0 % (ref 0.0–0.2)

## 2021-12-09 MED ORDER — GABAPENTIN 600 MG PO TABS: 600.0000 mg | ORAL_TABLET | Freq: Three times a day (TID) | ORAL | 3 refills | Status: DC

## 2021-12-09 MED ORDER — POTASSIUM CHLORIDE CRYS ER 20 MEQ PO TBCR: 20.0000 meq | EXTENDED_RELEASE_TABLET | Freq: Every day | ORAL | 0 refills | Status: DC

## 2021-12-09 NOTE — Progress Notes (Signed)
Survivorship Care Plan visit completed.  Treatment summary reviewed and given to patient.  ASCO answers booklet reviewed and given to patient.  CARE program and Cancer Transitions discussed with patient along with other resources cancer center offers to patients and caregivers.  Patient verbalized understanding.    

## 2021-12-09 NOTE — Progress Notes (Signed)
Hematology/Oncology Consult note [facility]  Telephone:([phone] [phone]  Patient Care Team: Casilda Carls, MD as PCP - General (Internal Medicine) Casilda Carls, MD as Consulting Physician (Internal Medicine) Christene Lye, MD (General Surgery) Garrel Ridgel, [place] as Consulting Physician (Podiatry) Sindy Guadeloupe, MD as Consulting Physician (Hematology and Oncology) [name], MD as Consulting Physician (General Surgery) Noreene Filbert, MD as Consulting Physician (Radiation Oncology)   Name of the patient: Michelle Walker  [id number]  Jan 13, 1961   Date of visit: 12/09/21  Diagnosis- recurrent left breast cancer stage II [id number] ER/PR positive HER-2 negative    Chief complaint/ Reason for visit-routine follow-up of breast cancer and peripheral neuropathy  Heme/Onc history:  Patient is a 61 year old female who was diagnosed with 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  She did not require adjuvant chemotherapy.  She was initially on tamoxifen which she could not tolerate and was switched to letrozole.  Last seen by me in June 2019 at which point she had a menstrual cycle and therefore not given another AI.  She had subsequently did not follow-up with me and remained off hormone therapy.     More recently patient underwent a diagnostic bilateral mammogram which showed a 2.2 x 1.9 x 1.9 cm mass at the 12 o'clock position of the left breast 1 cm from the nipple.  She was also found to have 5 other smaller breast masses ranging from 0.3 to 0.8 cm.  Noted to have a solitary left axillary lymph node with cortical thickening.  She had a biopsy of the dominant left breast mass as well as another smaller breast mass and the left axillary lymph node all of which were positive for metastatic invasive mammary carcinoma grade 2.  Tumor was ER 91 200% positive PR 71 to  80% positive and HER-2 IHC equivocal but negative by FISH   MRI of the bilateral breasts showed no abnormal findings in the right breast.  At least 6 discrete hypoechoic masses in the left breast with the largest one measuring 2.2 cm with 1 abnormal left axillary lymph node.  The size of the other breast masses ranging from 20m to 1.4 cm.  The total area of masses and non-mass enhancement was about 10 x 5 x 6.5 cm.  CT chest abdomen and pelvis with contrast showed no evidence of distant metastatic disease.  Bone scan was also negative.   MammaPrint done on the biopsy specimen came back as high risk with greater than 12% chemotherapy benefit.  Plan is for neoadjuvant dose dense AC-Taxol chemotherapy followed by surgery.  Start of chemotherapy was delayed since patient tested positive for Covid   Patient completed neoadjuvant ddAC- taxol chemo between feb-June 2022.    Patient underwent left mastectomy with axillary lymph node dissection.  Final pathology showed 12 mm multifocal invasive mammary carcinoma at least 3 of them in number overall grade 2 with negative margins.  Metastatic carcinoma involving 3 out of 12 lymph nodes.  No extranodal extension identified.  Largest site of metastatic deposit 3 mm.  pT1c pN1a   Patient completed adjuvant radiation therapy in October 2022    Interval history-states that Cymbalta has been helping her to some extent with both her anxiety and neuropathy.  She is currently taking gabapentin 600 mg at night and 300 mg during the day twice daily.  Neuropathy symptoms are still not under good control.  ECOG PS- 1 Pain scale- 3 Opioid associated constipation- no  Review of systems- Review of Systems  Constitutional:  Negative for chills, fever, malaise/fatigue and weight loss.  HENT:  Negative for congestion, ear discharge and nosebleeds.   Eyes:  Negative for blurred vision.  Respiratory:  Negative for cough, hemoptysis, sputum production, shortness of breath and  wheezing.   Cardiovascular:  Negative for chest pain, palpitations, orthopnea and claudication.  Gastrointestinal:  Negative for abdominal pain, blood in stool, constipation, diarrhea, heartburn, melena, nausea and vomiting.  Genitourinary:  Negative for dysuria, flank pain, frequency, hematuria and urgency.  Musculoskeletal:  Negative for back pain, joint pain and myalgias.  Skin:  Negative for rash.  Neurological:  Positive for sensory change (Peripheral neuropathy). Negative for dizziness, tingling, focal weakness, seizures, weakness and headaches.  Endo/Heme/Allergies:  Does not bruise/bleed easily.  Psychiatric/Behavioral:  Negative for depression and suicidal ideas. The patient is nervous/anxious. The patient does not have insomnia.     Allergies  Allergen Reactions   Triamcinolone Other (See Comments)    Hypopigmentation s/p steroid injection   Tape Rash    SURGICAL TAPE AFTER BREAST BIOPSY     Past Medical History:  Diagnosis Date   Anemia    Arthritis    Breast cancer ([place]) 08/2015   1.5 mm invasive lobular cancer, 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 Wide excision/ RT, T1a,N0. ER/PR pos, Her 2.neg. Tamoxifen x 6 months, d/c secondary to vasomotor sympotms.    Headache    Hypertension    Personal history of radiation therapy 2016   RIGHT lumpectomy   Thyroid disease      Past Surgical History:  Procedure Laterality Date   ANTERIOR CRUCIATE LIGAMENT REPAIR Right    BREAST BIOPSY Right 07/17/2015   INVASIVE LOBULAR CARCINOMA, CLASSIC TYPE (1.5 MM), ARISING IN A    BREAST BIOPSY Left 09/19/2020   [place] bx 3 areas/ 1oc q clip/ 12 oc vision clip, axilla lymph node hydromark shape 3/ path pending   BREAST LUMPECTOMY Right 08/22/2015   BREAST LUMPECTOMY WITH SENTINEL LYMPH NODE BIOPSY Right 08/22/2015   Procedure: BREAST LUMPECTOMY WITH SENTINEL LYMPH NODE BX;  Surgeon: Christene Lye, MD;  Location: [facility];  Service: General;  Laterality: Right;   COLONOSCOPY W/ POLYPECTOMY  2014    COLONOSCOPY WITH PROPOFOL N/A 04/16/2019   Procedure: COLONOSCOPY WITH BIOPSIES;  Surgeon: Lucilla Lame, MD;  Location: [place];  Service: Endoscopy;  Laterality: N/A;   DILATION AND CURETTAGE OF UTERUS     20 years ago   MASTECTOMY Left    2021   [place] ABLATION     POLYPECTOMY N/A 04/16/2019   Procedure: POLYPECTOMY;  Surgeon: Lucilla Lame, MD;  Location: [place];  Service: Endoscopy;  Laterality: N/A;   PORTACATH PLACEMENT Right 12/01/2020   Procedure: INSERTION PORT-A-CATH;  Surgeon: [name], MD;  Location: [facility];  Service: General;  Laterality: Right;   TOTAL MASTECTOMY Left 05/06/2021   Procedure: modified radical mastectomy;  Surgeon: [name], MD;  Location: [facility];  Service: General;  Laterality: Left;  Provider requesting 1.5 hours / 90 minutes for procedure    Social History   Socioeconomic History   Marital status: Married    Spouse name: Not on file   Number of children: Not on file   Years of education: Not on file   Highest education level: Not on file  Occupational History   Not on file  Tobacco Use   Smoking status: Every Day  Packs/day: 0.75    Years: 40.00    Pack years: 30.00    Types: Cigarettes   Smokeless tobacco: Never  Vaping Use   Vaping Use: Never used  Substance and Sexual Activity   Alcohol use: Yes    Alcohol/week: 2.0 standard drinks    Types: 2 Standard drinks or equivalent per week    Comment: BEER 1-2 QD   Drug use: No   Sexual activity: Yes    Comment: ablation  Other Topics Concern   Not on file  Social History Narrative   Not on file   Social Determinants of Health   Financial Resource Strain: Not on file  Food Insecurity: Not on file  Transportation Needs: Not on file  Physical Activity: Not on file  Stress: Not on file  Social Connections: Not on file  Intimate Partner Violence: Not on file    Family History  Problem Relation Age of Onset   Stroke Mother    Cancer  Sister        sarcoma on arm; chemo   Diabetes Sister    Stroke Sister    Diabetes Brother    Breast cancer Neg Hx    Ovarian cancer Neg Hx    Colon cancer Neg Hx    Heart disease Neg Hx      Current Outpatient Medications:    allopurinol (ZYLOPRIM) 100 MG tablet, Take 100 mg by mouth at bedtime., Disp: , Rfl:    amLODipine (NORVASC) 10 MG tablet, Take 10 mg by mouth at bedtime. , Disp: , Rfl: 0   Calcium Carb-Cholecalciferol (CALCIUM 600+D3 PO), Take 1 tablet by mouth at bedtime., Disp: , Rfl:    DULoxetine (CYMBALTA) 60 MG capsule, Take 1 capsule (60 mg total) by mouth daily., Disp: 90 capsule, Rfl: 1   gabapentin (NEURONTIN) 600 MG tablet, Take 1 tablet (600 mg total) by mouth 3 (three) times daily., Disp: 90 tablet, Rfl: 3   hydrochlorothiazide (HYDRODIURIL) 25 MG tablet, Take 25 mg by mouth daily., Disp: , Rfl:    letrozole (FEMARA) 2.5 MG tablet, Take 1 tablet (2.5 mg total) by mouth daily., Disp: 30 tablet, Rfl: 3   losartan (COZAAR) 100 MG tablet, Take 1 tablet by mouth daily., Disp: , Rfl:    metoprolol succinate (TOPROL-XL) 25 MG 24 hr tablet, Take 50 mg by mouth at bedtime., Disp: , Rfl: 0   potassium chloride SA (KLOR-CON M) 20 MEQ tablet, Take 1 tablet (20 mEq total) by mouth daily., Disp: 30 tablet, Rfl: 0   PROVENTIL HFA 108 (90 Base) MCG/ACT inhaler, Inhale 2 puffs into the lungs every 6 (six) hours as needed for shortness of breath or wheezing. (Patient not taking: Reported on 10/07/2021), Disp: , Rfl:  No current facility-administered medications for this visit.  Facility-Administered Medications Ordered in Other Visits:    sodium chloride flush (NS) 0.9 % injection 10 mL, 10 mL, Intravenous, PRN, Sindy Guadeloupe, MD, 10 mL at 03/27/21 0813  Physical exam:  Vitals:   12/09/21 0900 12/09/21 0946  BP: 136/86   Pulse: (!) 109   Resp: 16   Temp: 97.7 F (36.5 C)   TempSrc: Tympanic   SpO2: 97%   Weight: 178 lb (80.7 kg) 178 lb 11.2 oz (81.1 kg)  Height: '5\' 6"'   (1.676 m) '5\' 6"'  (1.676 m)   Physical Exam Constitutional:      General: She is not in acute distress. Cardiovascular:     Rate and Rhythm: Normal rate and regular  rhythm.     Heart sounds: Normal heart sounds.  Pulmonary:     Effort: Pulmonary effort is normal.     Breath sounds: Normal breath sounds.  Musculoskeletal:     Cervical back: Normal range of motion.     Comments: Trace bilateral edema  Skin:    General: Skin is warm and dry.  Neurological:     Mental Status: She is alert and oriented to person, place, and time.     CMP Latest Ref Rng & Units 12/09/2021  Glucose 70 - 99 mg/dL 116(H)  BUN 6 - 20 mg/dL 11  Creatinine 0.44 - 1.00 mg/dL 0.67  Sodium 135 - 145 mmol/L 135  Potassium 3.5 - 5.1 mmol/L 3.2(L)  Chloride 98 - 111 mmol/L 97(L)  CO2 22 - 32 mmol/L 28  Calcium 8.9 - 10.3 mg/dL 10.1  Total Protein 6.5 - 8.1 g/dL 7.7  Total Bilirubin 0.3 - 1.2 mg/dL 0.7  Alkaline Phos 38 - 126 U/L 62  AST 15 - 41 U/L 19  ALT 0 - 44 U/L 10   CBC Latest Ref Rng & Units 12/09/2021  WBC 4.0 - 10.5 K/uL 4.8  Hemoglobin 12.0 - 15.0 g/dL 14.3  Hematocrit 36.0 - 46.0 % 44.4  Platelets 150 - 400 K/uL 182     Assessment and plan- Patient is a 61 y.o. 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  she is s/p 4 cycles of dose dense AC and 12 weekly cycles of taxol neoadjuvant chemotherapy.  Patient underwent left mastectomy without reconstruction and final pathology showed multifocal invasive mammary carcinoma [id number].  She is here for routine follow-up of breast cancer on Arimidex  Chemo-induced peripheral neuropathy: I have asked her to gradually increase her gabapentin to 600 mg 3 times daily.  Continue Cymbalta.  We will reevaluate again in 3 months.  Breast cancer: Currently on Arimidex which she will continue.  She is also receiving Zometa every 3 months and  will receive her next dose 2 weeks from now and I will see her back in 3 months.  Recent right breast mammogram from December 2022 was unremarkable.  Baseline bone density scan is normal  Polycythemia: Self-limited resolved   Visit Diagnosis 1. Encounter for follow-up surveillance of breast cancer   2. Visit for monitoring Arimidex therapy   3. Chemotherapy-induced peripheral neuropathy ([place])   4. Polycythemia   5. Encounter for monitoring zoledronic acid therapy      Dr. [name], MD, MPH [facility] [phone] 12/09/2021 10:54 AM

## 2021-12-15 ENCOUNTER — Ambulatory Visit: Payer: 59 | Admitting: Surgery

## 2021-12-17 ENCOUNTER — Other Ambulatory Visit: Payer: Self-pay

## 2021-12-17 ENCOUNTER — Encounter: Payer: Self-pay | Admitting: Surgery

## 2021-12-17 ENCOUNTER — Ambulatory Visit: Payer: 59 | Admitting: Surgery

## 2021-12-17 VITALS — BP 138/84 | HR 147 | Temp 98.4°F | Ht 65.0 in | Wt 178.2 lb

## 2021-12-17 DIAGNOSIS — Z9012 Acquired absence of left breast and nipple: Secondary | ICD-10-CM

## 2021-12-17 NOTE — Patient Instructions (Signed)
Please follow up with PCP regarding your Tachycardia. We will reach out to you January 2023 to schedule a follow up appointment with Dr.Rodenberg for February 2024. Please call our office if you have questions or concerns.

## 2021-12-17 NOTE — Progress Notes (Signed)
Surgical Clinic Progress/Follow-up Note   HPI:  61 y.o. Female presents to clinic for right mammogram screening and left postmastectomy follow-up.  Last mammogram was the end of December of the right breast, and had no concerns for new developments, microcalcifications or other changes.  She had completed chemo last June, and had postmastectomy radiation as well.  She does complain that her left axilla is still slightly swollen and tender.  Review of Systems:  Constitutional: denies fever/chills  ENT: denies sore throat, hearing problems  Respiratory: denies shortness of breath, wheezing  Cardiovascular: denies chest pain, palpitations  Gastrointestinal: denies abdominal pain, N/V, or diarrhea/and bowel function as per interval history Skin: Denies any other rashes or skin discolorations except post-surgical wounds as per interval history  Vital Signs:  BP 138/84    Pulse (!) 147    Temp 98.4 F (36.9 C) (Oral)    Ht 5\' 5"  (1.651 m)    Wt 178 lb 3.2 oz (80.8 kg)    SpO2 93%    BMI 29.65 kg/m    Physical Exam:  Constitutional:  -- Normal body habitus  -- Awake, alert, and oriented x3  Pulmonary:  -- No crackles -- Equal breath sounds bilaterally -- Breathing non-labored at rest Cardiovascular:  -- S1, S2 present  -- No pericardial rubs  Gastrointestinal:  -- Soft and non-distended, non-tender GU  --Malachy Mood present as chaperone.  Left chest wall is free of any nodularity, skin changes or suspicious masses.  Right breast is consistent with postradiation changes lateral breast scars noted with axillary scar.  No dominant nor suspicious nodularity present.  No dermal changes suggestive of recurrent cancer. Musculoskeletal / Integumentary:  -- Wounds or skin discoloration: None appreciated except post-radiation changes as described above  -- Extremities: B/L UE and LE FROM, hands and feet warm, no edema    Imaging:  CLINICAL DATA:  Screening.   EXAM: DIGITAL SCREENING UNILATERAL  RIGHT MAMMOGRAM WITH CAD AND TOMOSYNTHESIS   TECHNIQUE: Right screening digital craniocaudal and mediolateral oblique mammograms were obtained. Right screening digital breast tomosynthesis was performed. The images were evaluated with computer-aided detection.   COMPARISON:  Previous exam(s).   ACR Breast Density Category b: There are scattered areas of fibroglandular density.   FINDINGS: The patient has had a left mastectomy. There are no findings suspicious for malignancy.   Surgical changes within the RIGHT breast are again noted.   IMPRESSION: No mammographic evidence of malignancy. A result letter of this screening mammogram will be mailed directly to the patient.   RECOMMENDATION: Screening mammogram in one year.  (Code:SM-R-42M)   BI-RADS CATEGORY  2: Benign.     Electronically Signed   By: Margarette Canada M.D.   On: 10/27/2021 11:59  Assessment:  61 y.o. yo Female with a problem list including...  Patient Active Problem List   Diagnosis Date Noted   Trochanteric bursitis of right hip 09/10/2021   Pain in joint of right hip 09/10/2021   Port-A-Cath in place 06/11/2021   S/P mastectomy, left 05/12/2021   Breast cancer metastasized to axillary lymph node, right ([place]) 05/06/2021   Prophylaxis for chemotherapy-induced neutropenia 01/16/2021   Genetic testing 11/26/2020   Goals of care, counseling/discussion 11/04/2020   Breast cancer, left ([place]) 11/04/2020   History of colonic polyps    Polyp of ascending colon    Depression 09/20/2017   DM2 (diabetes mellitus, type 2) ([place]) 09/20/2017   Gout 09/20/2017   HTN (hypertension) 09/20/2017   Migraine 09/20/2017   History  of endometrial ablation 02/26/2016   Malignant neoplasm of upper-outer quadrant of left breast in female, estrogen receptor positive ([place]) 08/04/2015    presents to clinic for follow-up evaluation of bilateral breast cancer, status postmastectomy on the left, progressing well.  Plan:               - return to clinic annually, or as needed, instructed to call office if any questions or concerns  All of the above recommendations were discussed with the patient, and all of patient's questions were answered to her expressed satisfaction.  These notes generated with voice recognition software. I apologize for typographical errors.  [name], MD, FACS [place]: [place] [facility]. Office: [phone]

## 2021-12-21 ENCOUNTER — Other Ambulatory Visit (HOSPITAL_COMMUNITY): Payer: Self-pay | Admitting: Internal Medicine

## 2021-12-21 ENCOUNTER — Other Ambulatory Visit: Payer: Self-pay | Admitting: Internal Medicine

## 2021-12-21 DIAGNOSIS — E0590 Thyrotoxicosis, unspecified without thyrotoxic crisis or storm: Secondary | ICD-10-CM

## 2021-12-23 ENCOUNTER — Inpatient Hospital Stay: Payer: 59

## 2021-12-23 ENCOUNTER — Inpatient Hospital Stay (HOSPITAL_BASED_OUTPATIENT_CLINIC_OR_DEPARTMENT_OTHER): Payer: 59 | Admitting: Hospice and Palliative Medicine

## 2021-12-23 ENCOUNTER — Telehealth: Payer: Self-pay | Admitting: *Deleted

## 2021-12-23 ENCOUNTER — Other Ambulatory Visit: Payer: Self-pay

## 2021-12-23 VITALS — BP 148/89 | HR 110 | Temp 97.9°F | Resp 18

## 2021-12-23 DIAGNOSIS — Z170 Estrogen receptor positive status [ER+]: Secondary | ICD-10-CM

## 2021-12-23 DIAGNOSIS — R531 Weakness: Secondary | ICD-10-CM

## 2021-12-23 DIAGNOSIS — C50012 Malignant neoplasm of nipple and areola, left female breast: Secondary | ICD-10-CM

## 2021-12-23 DIAGNOSIS — C50912 Malignant neoplasm of unspecified site of left female breast: Secondary | ICD-10-CM | POA: Diagnosis not present

## 2021-12-23 MED ORDER — ZOLEDRONIC ACID 4 MG/100ML IV SOLN
4.0000 mg | INTRAVENOUS | Status: DC
  Administered 2021-12-23: 4 mg via INTRAVENOUS
  Filled 2021-12-23: qty 100

## 2021-12-23 MED ORDER — SODIUM CHLORIDE 0.9 % IV SOLN
Freq: Once | INTRAVENOUS | Status: AC
  Filled 2021-12-23: qty 250

## 2021-12-23 NOTE — Telephone Encounter (Signed)
Rcvd incoming referral from [place], NP for OT screen for recommendations of worsening neuropathy in feet. Pt Has been previously evaluated by Gwenette Greet for lymphedema prevention.  Per Merrily Pew- patient will need an 8 am apt as she works 3rd shift. Maureen and I will review OT screen schedule to see what is available and then I will reach out to patient with apt.

## 2021-12-23 NOTE — Progress Notes (Signed)
Virtual Visit via Telephone Note  I connected with [name] on 12/23/21 at  9:30 AM EST by telephone and verified that I am speaking with the correct person using two identifiers.  Location: Patient: Home Provider: Clinic   I discussed the limitations, risks, security and privacy concerns of performing an evaluation and management service by telephone and the availability of in person appointments. I also discussed with the patient that there may be a patient responsible charge related to this service. The patient expressed understanding and agreed to proceed.   History of Present Illness: Michelle Walker is a 61 year old woman with multiple medical problems including recurrent stage II left breast cancer status post right lumpectomy in 2016 followed by adjuvant radiation therapy.  She was initially on tamoxifen which she could not tolerate and this was rotated to letrozole.  Patient developed left-sided recurrence and underwent left mastectomy without flare lymph node dissection.  She completed neoadjuvant chemo in June 2022 and adjuvant radiation in October 2022.  Patient has had peripheral neuropathy and anxiety and was referred to palliative care to help address symptoms.   Observations/Objective: I spoke with patient by phone.  She reports that her peripheral neuropathy is improving after dose increase of gabapentin to 600 mg 3 times daily.  She is also on Cymbalta 60 mg daily.  She says that she is now able to touch her feet without significant discomfort.  She does endorse vivid dreams since dose increase of gabapentin but denies other adverse effects.  She reports stable anxiety and denies other symptomatic concerns at present.  Assessment and Plan: Chemo-induced peripheral neuropathy -recommend continued gabapentin/Cymbalta.  Patient has tried acupuncture in the past with good effect and this could be utilized again if needed.  She does endorse some foot drop and I will refer her back  to [facility] for evaluation.  She may benefit from physical therapy.  Follow Up Instructions: As needed   I discussed the assessment and treatment plan with the patient. The patient was provided an opportunity to ask questions and all were answered. The patient agreed with the plan and demonstrated an understanding of the instructions.   The patient was advised to call back or seek an in-person evaluation if the symptoms worsen or if the condition fails to improve as anticipated.  I provided 6 minutes of non-face-to-face time during this encounter.   Irean Hong, NP

## 2021-12-23 NOTE — Patient Instructions (Signed)
[facility]  Discharge Instructions: °Thank you for choosing [facility] to provide your oncology and hematology care.  °If you have a lab appointment with the Cancer Center, please go directly to the Cancer Center and check in at the registration area. ° °Wear comfortable clothing and clothing appropriate for easy access to any Portacath or PICC line.  ° °We strive to give you quality time with your provider. You may need to reschedule your appointment if you arrive late (15 or more minutes).  Arriving late affects you and other patients whose appointments are after yours.  Also, if you miss three or more appointments without notifying the office, you may be dismissed from the clinic at the provider’s discretion.    °  °For prescription refill requests, have your pharmacy contact our office and allow 72 hours for refills to be completed.   ° °Today you received the following chemotherapy and/or immunotherapy agents ZOMETA    °  °To help prevent nausea and vomiting after your treatment, we encourage you to take your nausea medication as directed. ° °BELOW ARE SYMPTOMS THAT SHOULD BE REPORTED IMMEDIATELY: °*FEVER GREATER THAN 100.4 F (38 °C) OR HIGHER °*CHILLS OR SWEATING °*NAUSEA AND VOMITING THAT IS NOT CONTROLLED WITH YOUR NAUSEA MEDICATION °*UNUSUAL SHORTNESS OF BREATH °*UNUSUAL BRUISING OR BLEEDING °*URINARY PROBLEMS (pain or burning when urinating, or frequent urination) °*BOWEL PROBLEMS (unusual diarrhea, constipation, pain near the anus) °TENDERNESS IN MOUTH AND THROAT WITH OR WITHOUT PRESENCE OF ULCERS (sore throat, sores in mouth, or a toothache) °UNUSUAL RASH, SWELLING OR PAIN  °UNUSUAL VAGINAL DISCHARGE OR ITCHING  ° °Items with * indicate a potential emergency and should be followed up as soon as possible or go to the Emergency Department if any problems should occur. ° °Please show the CHEMOTHERAPY ALERT CARD or IMMUNOTHERAPY ALERT CARD at check-in to the  Emergency Department and triage nurse. ° °Should you have questions after your visit or need to cancel or reschedule your appointment, please contact [facility]  [phone] and follow the prompts.  Office hours are 8:00 a.m. to 4:30 p.m. Monday - Friday. Please note that voicemails left after 4:00 p.m. may not be returned until the following business day.  We are closed weekends and major holidays. You have access to a nurse at all times for urgent questions. Please call the main number to the clinic [phone] and follow the prompts. ° °For any non-urgent questions, you may also contact your provider using MyChart. We now offer e-Visits for anyone 18 and older to request care online for non-urgent symptoms. For details visit [email or web address]. °  °Also download the MyChart app! Go to the app store, search "MyChart", open the app, select [facility], and log in with your MyChart username and password. ° °Due to Covid, a mask is required upon entering the hospital/clinic. If you do not have a mask, one will be given to you upon arrival. For doctor visits, patients may have 1 support person aged 18 or older with them. For treatment visits, patients cannot have anyone with them due to current Covid guidelines and our immunocompromised population.  ° °Zoledronic Acid Injection (Hypercalcemia, Oncology) °What is this medication? °ZOLEDRONIC ACID (ZOE le dron ik AS id) slows calcium loss from bones. It high calcium levels in the blood from some kinds of cancer. It may be used in other people at risk for bone loss. °This medicine may be used for other purposes; ask   your health care provider or pharmacist if you have questions. °COMMON BRAND NAME(S): Zometa °What should I tell my care team before I take this medication? °They need to know if you have any of these conditions: °cancer °dehydration °dental disease °kidney disease °liver disease °low levels of calcium in the  blood °lung or breathing disease (asthma) °receiving steroids like dexamethasone or prednisone °an unusual or allergic reaction to zoledronic acid, other medicines, foods, dyes, or preservatives °pregnant or trying to get pregnant °breast-feeding °How should I use this medication? °This drug is injected into a vein. It is given by a health care provider in a hospital or clinic setting. °Talk to your health care provider about the use of this drug in children. Special care may be needed. °Overdosage: If you think you have taken too much of this medicine contact a poison control center or emergency room at once. °NOTE: This medicine is only for you. Do not share this medicine with others. °What if I miss a dose? °Keep appointments for follow-up doses. It is important not to miss your dose. Call your health care provider if you are unable to keep an appointment. °What may interact with this medication? °certain antibiotics given by injection °NSAIDs, medicines for pain and inflammation, like ibuprofen or naproxen °some diuretics like bumetanide, furosemide °teriparatide °thalidomide °This list may not describe all possible interactions. Give your health care provider a list of all the medicines, herbs, non-prescription drugs, or dietary supplements you use. Also tell them if you smoke, drink alcohol, or use illegal drugs. Some items may interact with your medicine. °What should I watch for while using this medication? °Visit your health care provider for regular checks on your progress. It may be some time before you see the benefit from this drug. °Some people who take this drug have severe bone, joint, or muscle pain. This drug may also increase your risk for jaw problems or a broken thigh bone. Tell your health care provider right away if you have severe pain in your jaw, bones, joints, or muscles. Tell you health care provider if you have any pain that does not go away or that gets worse. °Tell your dentist and  dental surgeon that you are taking this drug. You should not have major dental surgery while on this drug. See your dentist to have a dental exam and fix any dental problems before starting this drug. Take good care of your teeth while on this drug. Make sure you see your dentist for regular follow-up appointments. °You should make sure you get enough calcium and vitamin D while you are taking this drug. Discuss the foods you eat and the vitamins you take with your health care provider. °Check with your health care provider if you have severe diarrhea, nausea, and vomiting, or if you sweat a lot. The loss of too much body fluid may make it dangerous for you to take this drug. °You may need blood work done while you are taking this drug. °Do not become pregnant while taking this drug. Women should inform their health care provider if they wish to become pregnant or think they might be pregnant. There is potential for serious harm to an unborn child. Talk to your health care provider for more information. °What side effects may I notice from receiving this medication? °Side effects that you should report to your doctor or health care provider as soon as possible: °allergic reactions (skin rash, itching or hives; swelling of the face, lips, or   tongue) °bone pain °infection (fever, chills, cough, sore throat, pain or trouble passing urine) °jaw pain, especially after dental work °joint pain °kidney injury (trouble passing urine or change in the amount of urine) °low blood pressure (dizziness; feeling faint or lightheaded, falls; unusually weak or tired) °low calcium levels (fast heartbeat; muscle cramps or pain; pain, tingling, or numbness in the hands or feet; seizures) °low magnesium levels (fast, irregular heartbeat; muscle cramp or pain; muscle weakness; tremors; seizures) °low red blood cell counts (trouble breathing; feeling faint; lightheaded, falls; unusually weak or tired) °muscle pain °redness, blistering,  peeling, or loosening of the skin, including inside the mouth °severe diarrhea °swelling of the ankles, feet, hands °trouble breathing °Side effects that usually do not require medical attention (report to your doctor or health care provider if they continue or are bothersome): °anxious °constipation °coughing °depressed mood °eye irritation, itching, or pain °fever °general ill feeling or flu-like symptoms °nausea °pain, redness, or irritation at site where injected °trouble sleeping °This list may not describe all possible side effects. Call your doctor for medical advice about side effects. You may report side effects to FDA at 1-800-FDA-1088. °Where should I keep my medication? °This drug is given in a hospital or clinic. It will not be stored at home. °NOTE: This sheet is a summary. It may not cover all possible information. If you have questions about this medicine, talk to your doctor, pharmacist, or health care provider. °© 2022 Elsevier/Gold Standard (2021-07-07 00:00:00) ° °

## 2021-12-25 ENCOUNTER — Ambulatory Visit: Payer: 59 | Admitting: Podiatry

## 2021-12-25 ENCOUNTER — Encounter: Payer: Self-pay | Admitting: Podiatry

## 2021-12-25 ENCOUNTER — Other Ambulatory Visit: Payer: Self-pay

## 2021-12-25 ENCOUNTER — Encounter: Payer: Self-pay | Admitting: Oncology

## 2021-12-25 DIAGNOSIS — L989 Disorder of the skin and subcutaneous tissue, unspecified: Secondary | ICD-10-CM

## 2021-12-25 NOTE — Telephone Encounter (Signed)
Apt for OT screen sch. For 3/8 at 8:30 am with maureen.- mychart msg sent to patient.

## 2021-12-25 NOTE — Progress Notes (Signed)
Subjective: 61 y.o. female presenting to the office today for evaluation of a symptomatic skin lesion to the right fifth digit.  He was last trimmed about a year and a half ago and she says that it was feeling great up until a few months ago.  She would like to have it debrided again today.  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 Wide excision/ RT, T1a,N0. ER/PR pos, Her 2.neg. Tamoxifen x 6 months, d/c secondary to vasomotor sympotms.    Headache    Hypertension    Personal history of radiation therapy 2016   RIGHT lumpectomy   Thyroid disease    Past Surgical History:  Procedure Laterality Date   ANTERIOR CRUCIATE LIGAMENT REPAIR Right    BREAST BIOPSY Right 07/17/2015   INVASIVE LOBULAR CARCINOMA, CLASSIC TYPE (1.5 MM), ARISING IN A    BREAST BIOPSY Left 09/19/2020   [place] bx 3 areas/ 1oc q clip/ 12 oc vision clip, axilla lymph node hydromark shape 3/ path pending   BREAST LUMPECTOMY Right 08/22/2015   BREAST LUMPECTOMY WITH SENTINEL LYMPH NODE BIOPSY Right 08/22/2015   Procedure: BREAST LUMPECTOMY WITH SENTINEL LYMPH NODE BX;  Surgeon: Christene Lye, MD;  Location: [facility];  Service: General;  Laterality: Right;   COLONOSCOPY W/ POLYPECTOMY  2014   COLONOSCOPY WITH PROPOFOL N/A 04/16/2019   Procedure: COLONOSCOPY WITH BIOPSIES;  Surgeon: Lucilla Lame, MD;  Location: [place];  Service: Endoscopy;  Laterality: N/A;   DILATION AND CURETTAGE OF UTERUS     20 years ago   MASTECTOMY Left    2021   [place] ABLATION     POLYPECTOMY N/A 04/16/2019   Procedure: POLYPECTOMY;  Surgeon: Lucilla Lame, MD;  Location: [facility];  Service: Endoscopy;  Laterality: N/A;   PORTACATH PLACEMENT Right 12/01/2020   Procedure: INSERTION PORT-A-CATH;  Surgeon: [name], MD;  Location: [facility];  Service: General;   Laterality: Right;   TOTAL MASTECTOMY Left 05/06/2021   Procedure: modified radical mastectomy;  Surgeon: [name], MD;  Location: [facility];  Service: General;  Laterality: Left;  Provider requesting 1.5 hours / 90 minutes for procedure   Allergies  Allergen Reactions   Triamcinolone Other (See Comments)    Hypopigmentation s/p steroid injection   Tape Rash    SURGICAL TAPE AFTER BREAST BIOPSY     Objective:  Physical Exam General: Alert and oriented x3 in no acute distress  Dermatology: Hyperkeratotic lesion(s) present on the lateral aspect of the right fifth digit. Pain on palpation with a central nucleated core noted. Skin is warm, dry and supple bilateral lower extremities. Negative for open lesions or macerations.  Vascular: Palpable pedal pulses bilaterally. No edema or erythema noted. Capillary refill within normal limits.  Neurological: Epicritic and protective threshold grossly intact bilaterally.   Musculoskeletal Exam: Pain on palpation at the keratotic lesion(s) noted. Range of motion within normal limits bilateral. Muscle strength 5/5 in all groups bilateral.  Assessment: 1.  Benign hyperkeratotic porokeratosis right fifth digit   Plan of Care:  1. Patient evaluated 2. Excisional debridement of keratoic lesion(s) using a chisel blade was performed without incident.  3.  Recommend wide fitting shoes that do not constrict the toe area 4. Patient is to return to the clinic PRN.   Edrick Kins, DPM [facility]  Dr. Edrick Kins, DPM    [address], [place] [address]                Office [phone]  Fax [phone]

## 2021-12-30 ENCOUNTER — Encounter
Admission: RE | Admit: 2021-12-30 | Discharge: 2021-12-30 | Disposition: A | Discharge status: Home / Self Care | Payer: 59 | Source: Ambulatory Visit | Attending: Internal Medicine | Admitting: Internal Medicine

## 2021-12-30 DIAGNOSIS — E0590 Thyrotoxicosis, unspecified without thyrotoxic crisis or storm: Secondary | ICD-10-CM | POA: Diagnosis present

## 2021-12-30 MED ORDER — SODIUM IODIDE I-123 7.4 MBQ CAPS
311.4000 | ORAL_CAPSULE | Freq: Once | ORAL | Status: AC
  Administered 2021-12-30: 311.4 via ORAL

## 2021-12-31 ENCOUNTER — Encounter
Admission: RE | Admit: 2021-12-31 | Discharge: 2021-12-31 | Disposition: A | Discharge status: Home / Self Care | Payer: 59 | Source: Ambulatory Visit | Attending: Internal Medicine | Admitting: Internal Medicine

## 2022-01-04 ENCOUNTER — Telehealth: Payer: Self-pay | Admitting: *Deleted

## 2022-01-04 NOTE — Telephone Encounter (Signed)
Patient needs to reschedule appointment. ?

## 2022-01-04 NOTE — Telephone Encounter (Signed)
Appoint with Luna Fuse ?

## 2022-01-06 ENCOUNTER — Other Ambulatory Visit: Payer: Self-pay | Admitting: Oncology

## 2022-01-06 ENCOUNTER — Ambulatory Visit: Payer: 59 | Admitting: Occupational Therapy

## 2022-01-06 NOTE — Telephone Encounter (Signed)
Component Ref Range & Units 4 wk ago ?(12/09/21) 3 mo ago ?(10/07/21) 3 mo ago ?(09/22/21) 4 mo ago ?(09/04/21) 8 mo ago ?(05/05/21) 8 mo ago ?(05/01/21) 8 mo ago ?(04/17/21)  ?Potassium 3.5 - 5.1 mmol/L 3.2 Low   3.2 Low   3.7  2.8 Low   3.9 CM  3.7  3.4 Low   ? ?

## 2022-01-13 ENCOUNTER — Inpatient Hospital Stay: Payer: 59 | Admitting: Occupational Therapy

## 2022-01-13 ENCOUNTER — Other Ambulatory Visit: Payer: Self-pay | Admitting: Oncology

## 2022-01-13 ENCOUNTER — Encounter: Payer: Self-pay | Admitting: Oncology

## 2022-01-27 ENCOUNTER — Other Ambulatory Visit: Payer: Self-pay

## 2022-01-27 ENCOUNTER — Inpatient Hospital Stay: Payer: 59 | Attending: Oncology | Admitting: Occupational Therapy

## 2022-01-27 DIAGNOSIS — R531 Weakness: Secondary | ICD-10-CM

## 2022-01-27 DIAGNOSIS — G620 Drug-induced polyneuropathy: Secondary | ICD-10-CM

## 2022-01-27 NOTE — Therapy (Signed)
[facility] ?[facility] ?[address], [address] ?[place], [place], [address] ?Phone: [phone]   Fax:  [phone] ? ?Occupational Therapy Screen:  ? ?Patient Details  ?Name: Michelle Walker ?MRN: [id number] ?Date of Birth: December 21, 1960 ?No data recorded ? ?Encounter Date: 01/27/2022 ? ? OT End of Session - 01/27/22 1755   ? ? Visit Number 0   ? ?  ?  ? ?  ? ? ?Past Medical History:  ?Diagnosis Date  ? Anemia   ? Arthritis   ? Breast cancer ([place]) 08/2015  ? 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 Wide excision/ RT, T1a,N0. ER/PR pos, Her 2.neg. Tamoxifen x 6 months, d/c secondary to vasomotor sympotms.   ? Headache   ? Hypertension   ? Personal history of radiation therapy 2016  ? RIGHT lumpectomy  ? Thyroid disease   ? ? ?Past Surgical History:  ?Procedure Laterality Date  ? ANTERIOR CRUCIATE LIGAMENT REPAIR Right   ? BREAST BIOPSY Right 07/17/2015  ? INVASIVE LOBULAR CARCINOMA, CLASSIC TYPE (1.5 MM), ARISING IN A   ? BREAST BIOPSY Left 09/19/2020  ? [place] bx 3 areas/ 1oc q clip/ 12 oc vision clip, axilla lymph node hydromark shape 3/ path pending  ? BREAST LUMPECTOMY Right 08/22/2015  ? BREAST LUMPECTOMY WITH SENTINEL LYMPH NODE BIOPSY Right 08/22/2015  ? Procedure: BREAST LUMPECTOMY WITH SENTINEL LYMPH NODE BX;  Surgeon: Christene Lye, MD;  Location: [facility];  Service: General;  Laterality: Right;  ? COLONOSCOPY W/ POLYPECTOMY  2014  ? COLONOSCOPY WITH PROPOFOL N/A 04/16/2019  ? Procedure: COLONOSCOPY WITH BIOPSIES;  Surgeon: Lucilla Lame, MD;  Location: [facility];  Service: Endoscopy;  Laterality: N/A;  ? DILATION AND CURETTAGE OF UTERUS    ? 20 years ago  ? MASTECTOMY Left   ? 2021  ? [place] ABLATION    ? POLYPECTOMY N/A 04/16/2019  ? Procedure: POLYPECTOMY;  Surgeon: Lucilla Lame, MD;  Location: [facility];  Service: Endoscopy;  Laterality: N/A;  ? PORTACATH PLACEMENT Right 12/01/2020  ? Procedure: INSERTION PORT-A-CATH;  Surgeon:  [name], MD;  Location: [facility];  Service: General;  Laterality: Right;  ? TOTAL MASTECTOMY Left 05/06/2021  ? Procedure: modified radical mastectomy;  Surgeon: [name], MD;  Location: [facility];  Service: General;  Laterality: Left;  Provider requesting 1.5 hours / 90 minutes for procedure  ? ? ?There were no vitals filed for this visit. ? ? Subjective Assessment - 01/27/22 1753   ? ? Subjective  Since I have seen you last year my legs got worse especially my right leg below my knee to my ankle and left foot-neuropathy is bothering me more and I have to wear steel toe shoes at work.  I still work 8-hour night shift but about the job where I can set now more did not fall in the last 5 to 6 months but I am very unsteady.  My right leg below my knee and fluid throbs at the end of the day tingling and soreness about a 6/10 pain scale.  If I walk for long time her back does start hurting.  My hip does not hurt since I had the shot last year.   ? Currently in Pain? Yes   ? Pain Score 6    ? Pain Location Leg   ? Pain Orientation Right   ? Pain Descriptors / Indicators Throbbing;Tightness;Pins and needles;Numbness   ? Pain Type Chronic pain   ? ?  ?  ? ?  ? ? ? ? ?  12/23/21 NOTE Josh Borders NP :  ?History of Present Illness: ?Michelle Walker is a 61 year old woman with multiple medical problems including recurrent stage II left breast cancer status post right lumpectomy in 2016 followed by adjuvant radiation therapy.  She was initially on tamoxifen which she could not tolerate and this was rotated to letrozole.  Patient developed left-sided recurrence and underwent left mastectomy without flare lymph node dissection.  She completed neoadjuvant chemo in June 2022 and adjuvant radiation in October 2022.  Patient has had peripheral neuropathy and anxiety and was referred to palliative care to help address symptoms. ?  ?Observations/Objective: ?I spoke with patient by phone.  She reports that her peripheral  neuropathy is improving after dose increase of gabapentin to 600 mg 3 times daily.  She is also on Cymbalta 60 mg daily.  She says that she is now able to touch her feet without significant discomfort.  She does endorse vivid dreams since dose increase of gabapentin but denies other adverse effects.  She reports stable anxiety and denies other symptomatic concerns at present. ?  ?Assessment and Plan: ?Chemo-induced peripheral neuropathy -recommend continued gabapentin/Cymbalta.  Patient has tried acupuncture in the past with good effect and this could be utilized again if needed.  She does endorse some foot drop and I will refer her back to [facility] for evaluation.  She may benefit from physical therapy. ?  ? ? ? ?OT SCREEN 01/27/22: ? ?Patient was seen in 2022 by OT for postmastectomy lymphatic cording and scar tissue.  As well of lymphedema education.  Patient was seen during and after radiation.  Bilateral shoulder AROM increased to within normal limits and strength to return back to work. ?This date patient referred for screening for increased neuropathy in bilateral feet.  Patient with increased difficulty ambulating and balance because of neuropathy.  Patient hip pain improved after having a shot last year.  But increased pain in the right below-knee and ankle with increased standing and walking at work.  Right leg worse than left.  Patient has to wear steel toe shoes at her work.  She can sit now with her new job and do not have to work overhead.  Patient with increased weakness in the right hip flexion, knee extension as well as bilateral ankle motion.  Extremely tight in ankle motion.BERG balance test done patient score 38/56 this date putting at medium risk for falling. ?Patient in agreement she needs PT evaluation and treatment sent message for PT eval referral put in to sports rehab patient prefer being in [place] and affiliated with [facility]  purposes. ? ? ? ? ? ? ? ? ? ? ? ? ? ? ? ? ? ? ? ? ? ? ? ? ? ? ?  ?  ?  ? ? ?Visit Diagnosis: ?Weakness ? ?Chemotherapy-induced peripheral neuropathy ([place]) ? ? ? ?Problem List ?Patient Active Problem List  ? Diagnosis Date Noted  ? Trochanteric bursitis of right hip 09/10/2021  ? Pain in joint of right hip 09/10/2021  ? S/P mastectomy, left 05/12/2021  ? Breast cancer metastasized to axillary lymph node, right ([place]) 05/06/2021  ? Prophylaxis for chemotherapy-induced neutropenia 01/16/2021  ? Genetic testing 11/26/2020  ? Goals of care, counseling/discussion 11/04/2020  ? Breast cancer, left ([place]) 11/04/2020  ? History of colonic polyps   ? Polyp of ascending colon   ? Depression 09/20/2017  ? DM2 (diabetes mellitus, type 2) ([place]) 09/20/2017  ? Gout 09/20/2017  ? HTN (hypertension) 09/20/2017  ?  Migraine 09/20/2017  ? History of endometrial ablation 02/26/2016  ? Malignant neoplasm of upper-outer quadrant of left breast in female, estrogen receptor positive ([place]) 08/04/2015  ? ? ?Rosalyn Gess, OTR/L,CLT ?01/27/2022, 5:56 PM ? ?[place] ?[facility] ?[address], [address] ?[place], [place], [address] ?Phone: [phone]   Fax:  [phone] ? ?Name: Michelle Walker ?MRN: [id number] ?Date of Birth: [birth year] ? ?

## 2022-02-03 ENCOUNTER — Other Ambulatory Visit (HOSPITAL_COMMUNITY)
Admission: RE | Admit: 2022-02-03 | Discharge: 2022-02-03 | Disposition: A | Discharge status: Home / Self Care | Payer: 59 | Source: Ambulatory Visit | Attending: Obstetrics and Gynecology | Admitting: Obstetrics and Gynecology

## 2022-02-03 ENCOUNTER — Encounter: Payer: Self-pay | Admitting: Obstetrics and Gynecology

## 2022-02-03 ENCOUNTER — Ambulatory Visit (INDEPENDENT_AMBULATORY_CARE_PROVIDER_SITE_OTHER): Payer: 59 | Admitting: Obstetrics and Gynecology

## 2022-02-03 VITALS — BP 117/78 | HR 109 | Ht 65.0 in | Wt 180.6 lb

## 2022-02-03 DIAGNOSIS — R87620 Atypical squamous cells of undetermined significance on cytologic smear of vagina (ASC-US): Secondary | ICD-10-CM | POA: Diagnosis not present

## 2022-02-03 DIAGNOSIS — R87811 Vaginal high risk human papillomavirus (HPV) DNA test positive: Secondary | ICD-10-CM

## 2022-02-03 DIAGNOSIS — R87619 Unspecified abnormal cytological findings in specimens from cervix uteri: Secondary | ICD-10-CM

## 2022-02-03 DIAGNOSIS — Z7689 Persons encountering health services in other specified circumstances: Secondary | ICD-10-CM | POA: Diagnosis not present

## 2022-02-03 NOTE — Progress Notes (Signed)
Patient presents today for colposcopy due to abnormal pap smear. She states no history of abnormal pap smears. Patient states no other questions or concerns.  ?

## 2022-02-03 NOTE — Progress Notes (Signed)
? ?HPI:  ?Michelle Walker is a 61 y.o.  [phone]  who presents today for evaluation and management of abnormal cervical cytology.   ? ?Dysplasia History: ASCUS positive HPV ?She reports this is her first abnormal Pap. ?Of significant note, patient has had a history of breast cancer and has just completed 1 year from diagnosis which included chemotherapy and mastectomy. ?ROS:  Pertinent items noted in HPI and remainder of comprehensive ROS otherwise negative. ? ?OB History  ?Gravida Para Term Preterm AB Living  ?'2 2 2 '$ 0 0 2  ?SAB IAB Ectopic Multiple Live Births  ?0 0 0 0 2  ?  ?# Outcome Date GA Lbr Len/2nd Weight Sex Delivery Anes PTL Lv  ?2 Term 18    M Vag-Spont   LIV  ?[place]  ?Obstetric Comments  ?1st Menstrual Cycle:  10  ?1st Pregnancy:  32  ? ? ?Past Medical History:  ?Diagnosis Date  ? Anemia   ? Arthritis   ? Breast cancer ([place]) 08/2015  ? 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 Wide excision/ RT, T1a,N0. ER/PR pos, Her 2.neg. Tamoxifen x 6 months, d/c secondary to vasomotor sympotms.   ? Headache   ? Hypertension   ? Peripheral neuropathy   ? Personal history of radiation therapy 2016  ? RIGHT lumpectomy  ? Thyroid disease   ? ? ?Past Surgical History:  ?Procedure Laterality Date  ? ANTERIOR CRUCIATE LIGAMENT REPAIR Right   ? BREAST BIOPSY Right 07/17/2015  ? INVASIVE LOBULAR CARCINOMA, CLASSIC TYPE (1.5 MM), ARISING IN A   ? BREAST BIOPSY Left 09/19/2020  ? [place] bx 3 areas/ 1oc q clip/ 12 oc vision clip, axilla lymph node hydromark shape 3/ path pending  ? BREAST LUMPECTOMY Right 08/22/2015  ? BREAST LUMPECTOMY WITH SENTINEL LYMPH NODE BIOPSY Right 08/22/2015  ? Procedure: BREAST LUMPECTOMY WITH SENTINEL LYMPH NODE BX;  Surgeon: Christene Lye, MD;  Location: [facility];  Service: General;  Laterality: Right;  ? COLONOSCOPY W/ POLYPECTOMY  2014  ? COLONOSCOPY WITH PROPOFOL N/A 04/16/2019  ? Procedure: COLONOSCOPY WITH BIOPSIES;  Surgeon: Lucilla Lame, MD;   Location: [facility];  Service: Endoscopy;  Laterality: N/A;  ? DILATION AND CURETTAGE OF UTERUS    ? 20 years ago  ? MASTECTOMY Left   ? 2021  ? NOVASURE ABLATION    ? POLYPECTOMY N/A 04/16/2019  ? Procedure: POLYPECTOMY;  Surgeon: Lucilla Lame, MD;  Location: [facility];  Service: Endoscopy;  Laterality: N/A;  ? PORTACATH PLACEMENT Right 12/01/2020  ? Procedure: INSERTION PORT-A-CATH;  Surgeon: [name], MD;  Location: [facility];  Service: General;  Laterality: Right;  ? TOTAL MASTECTOMY Left 05/06/2021  ? Procedure: modified radical mastectomy;  Surgeon: [name], MD;  Location: [facility];  Service: General;  Laterality: Left;  Provider requesting 1.5 hours / 90 minutes for procedure  ? ? ?SOCIAL HISTORY: ? ?Social History  ? ?Substance and Sexual Activity  ?Alcohol Use Yes  ? Alcohol/week: 2.0 standard drinks  ? Types: 2 Standard drinks or equivalent per week  ? Comment: BEER 1-2 QD  ? ? ?Social History  ? ?Substance and Sexual Activity  ?Drug Use No  ? ? ? ?Family History  ?Problem Relation Age of Onset  ? Stroke Mother   ? Cancer Sister   ?     sarcoma on arm; chemo  ? Diabetes Sister   ? Stroke Sister   ?  Diabetes Brother   ? Breast cancer Neg Hx   ? Ovarian cancer Neg Hx   ? Colon cancer Neg Hx   ? Heart disease Neg Hx   ? ? ?ALLERGIES:  Triamcinolone and Tape ? ?She has a current medication list which includes the following prescription(s): allopurinol, amlodipine, calcium carb-cholecalciferol, duloxetine, gabapentin, hydrochlorothiazide, letrozole, losartan, and metoprolol succinate, and the following Facility-Administered Medications: sodium chloride flush. ? ?Physical Exam: ?-Vitals:  ?BP 117/78   Pulse (!) 109   Ht '5\' 5"'$  (1.651 m)   Wt 180 lb 9.6 oz (81.9 kg)   BMI 30.05 kg/m?  ? ?PROCEDURE: ?Colposcopy performed with 4% acetic acid after verbal consent obtained ?             ?             -Aceto-white Lesions Location(s): 7 to 8 o'clock. ?             -Biopsy  performed at 7 and 8 o'clock  ?             -ECC indicated and performed: Yes.   ?    -Biopsy sites made hemostatic with pressure and Monsel's solution ?  -Satisfactory colposcopy: Yes.    ?  -Evidence of Invasive cervical CA :  NO ? ?ASSESSMENT:  Michelle Walker is a 61 y.o. [phone] here for  ?1. Establishing care with new doctor, encounter for   ?2. ASCUS with positive high risk human papillomavirus of vagina   ?. ? ?PLAN: ?1.  I discussed the grading system of pap smears and HPV high risk viral types.  We will discuss management after colpo results return. ? ?No orders of the defined types were placed in this encounter. ?  ?    ?   F/U ? Return in about 2 weeks (around 02/17/2022). ?I spent 19 minutes involved in the care of this patient preparing to see the patient by obtaining and reviewing her medical history (including labs, imaging tests and prior procedures), documenting clinical information in the electronic health record (EHR), counseling and coordinating care plans, writing and sending prescriptions, ordering tests or procedures and in direct communicating with the patient and medical staff discussing pertinent items from her history and physical exam. ? ?Jeannie Fend ,MD ?02/03/2022,11:56 AM ?

## 2022-02-04 LAB — SURGICAL PATHOLOGY

## 2022-02-10 IMAGING — US US EXTREM LOW VENOUS*L*
1 series · 13 of 24 positions shown · non-contrast
Comparison: None.

CLINICAL DATA: Left lower extremity pain and swelling



[Series 1: us venous img lower uni left (dvt) · portal-venous · 13 of 32 slices shown]
[im 1/32]
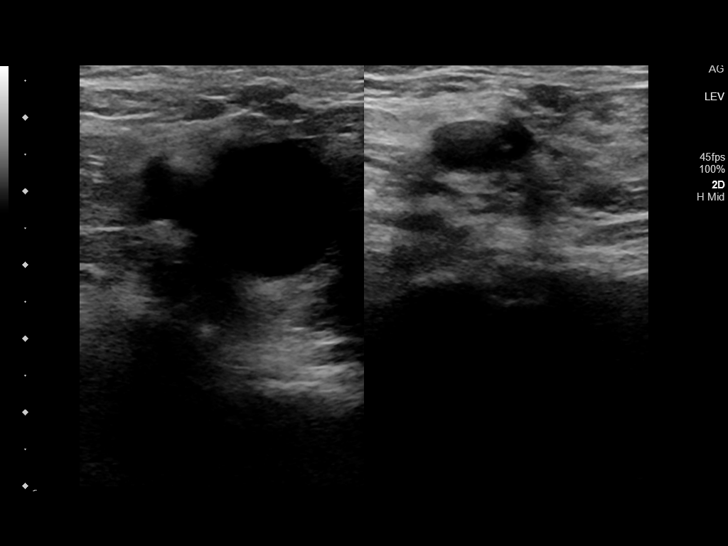
[im 3/32]
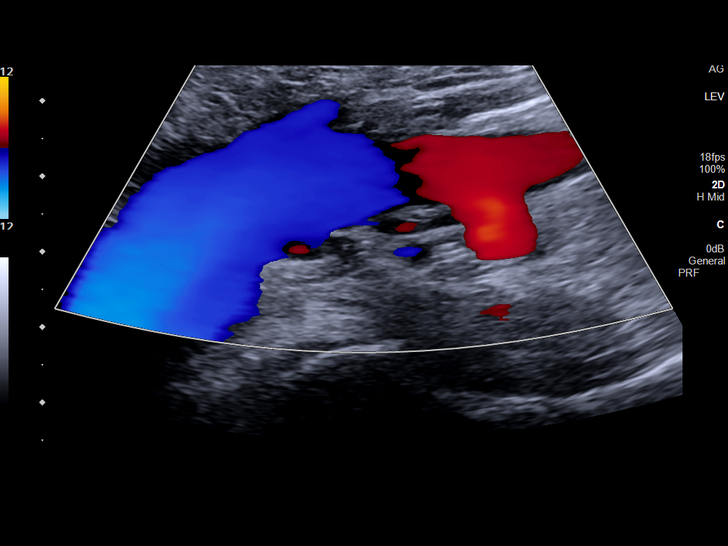
[im 6/32]
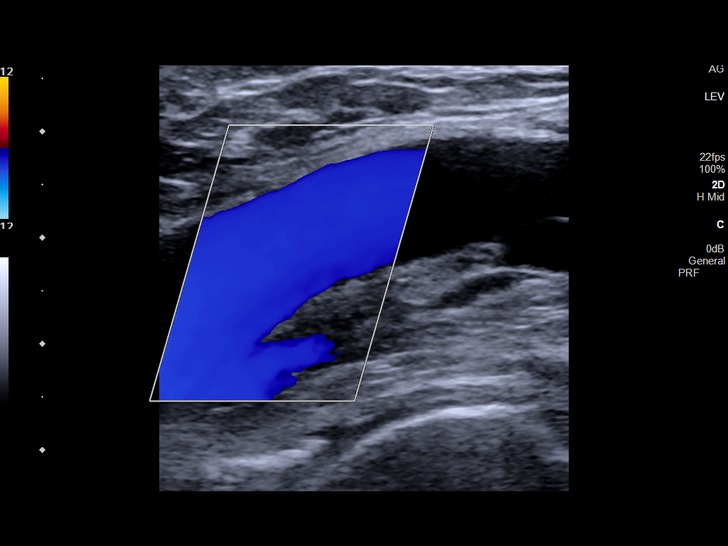
[im 9/32]
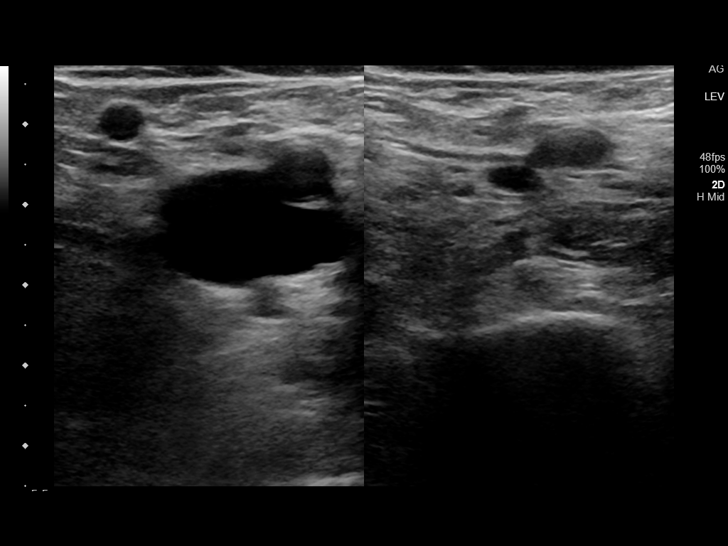
[im 11/32]
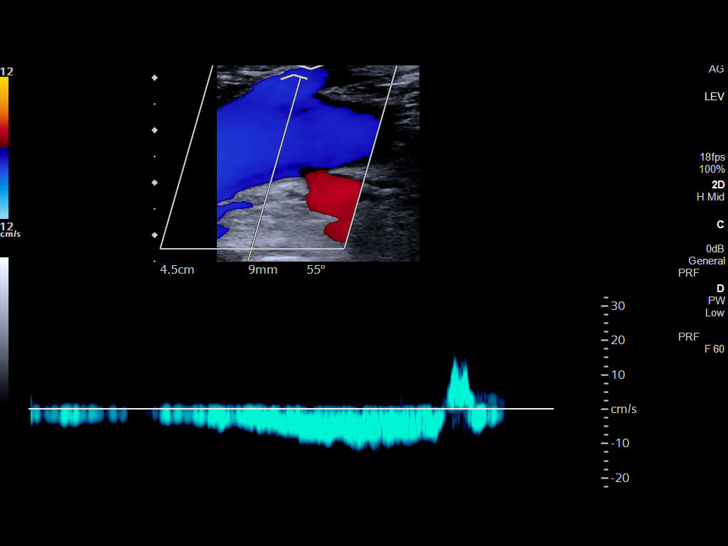
[im 14/32]
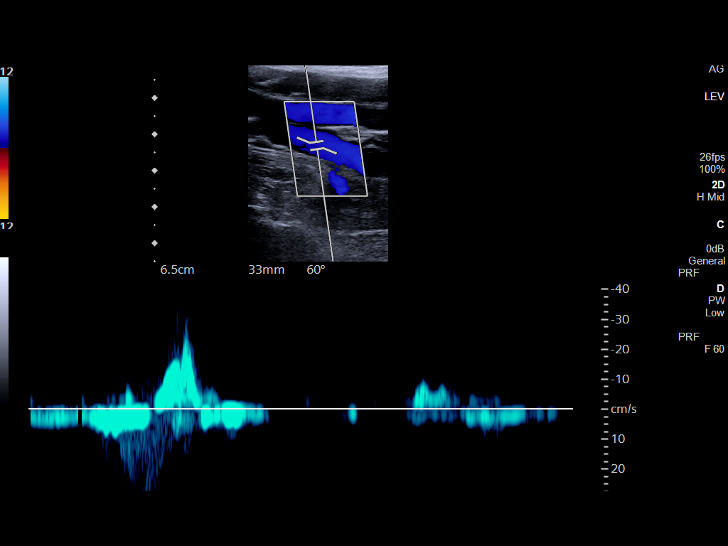
[im 17/32]
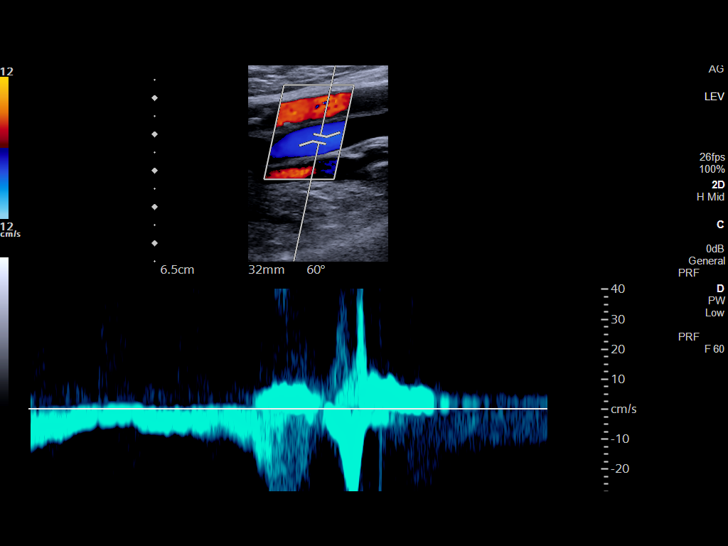
[im 18/32]
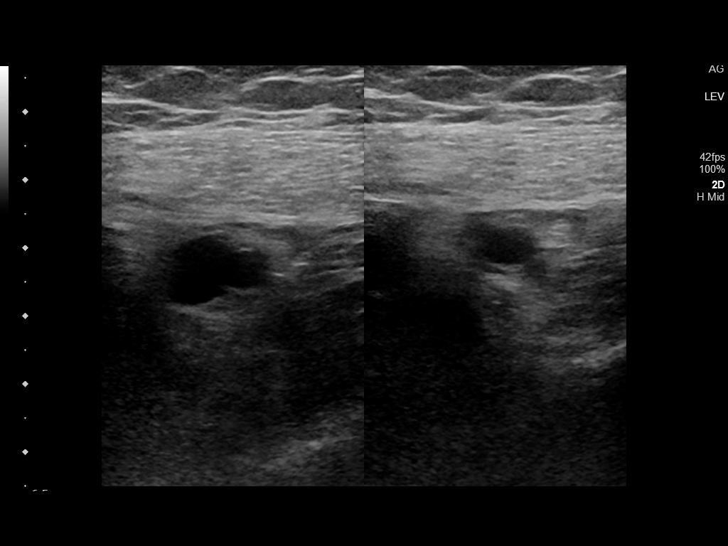
[im 21/32]
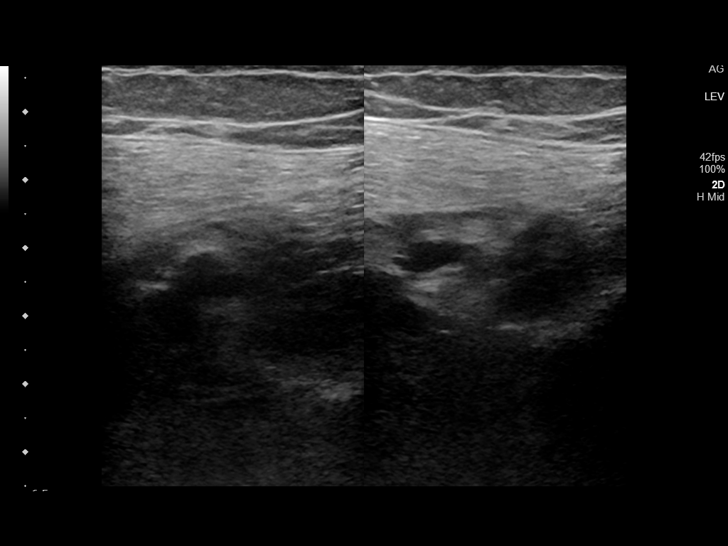
[im 23/32]
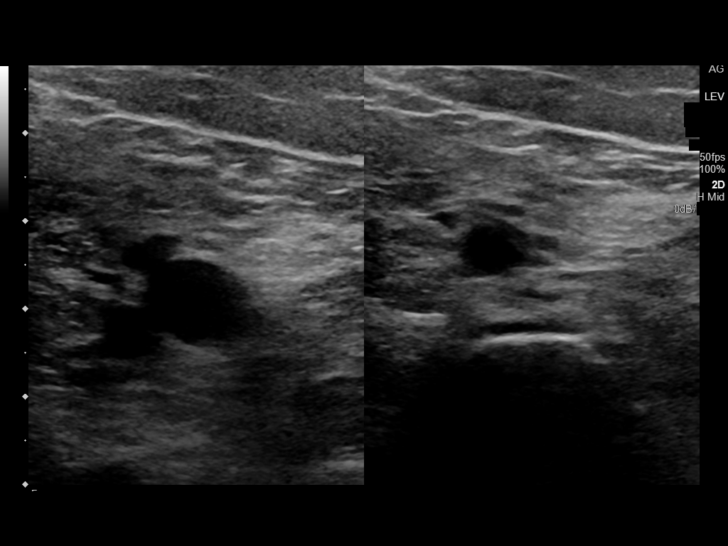
[im 26/32]
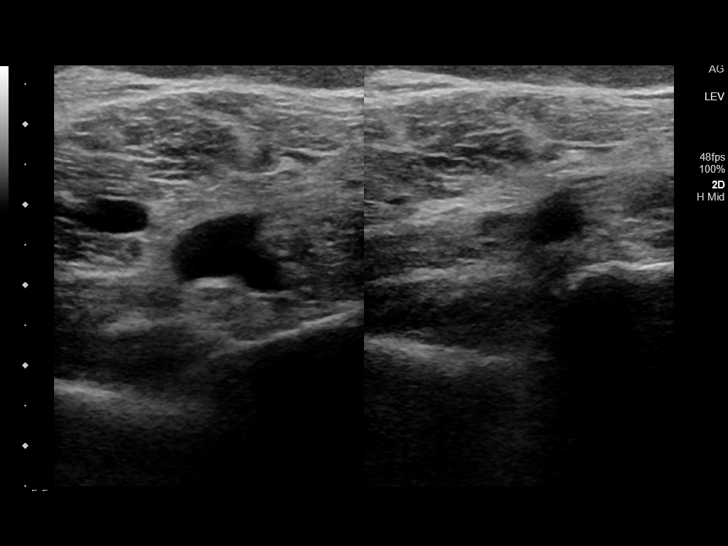
[im 29/32]
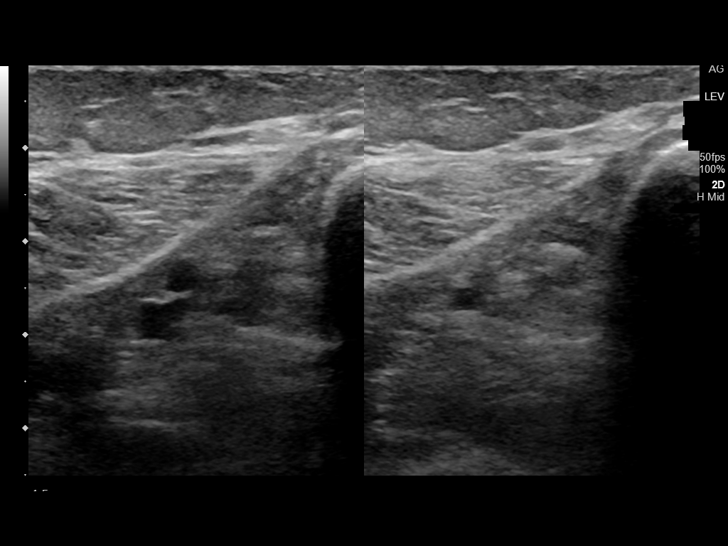
[im 32/32]
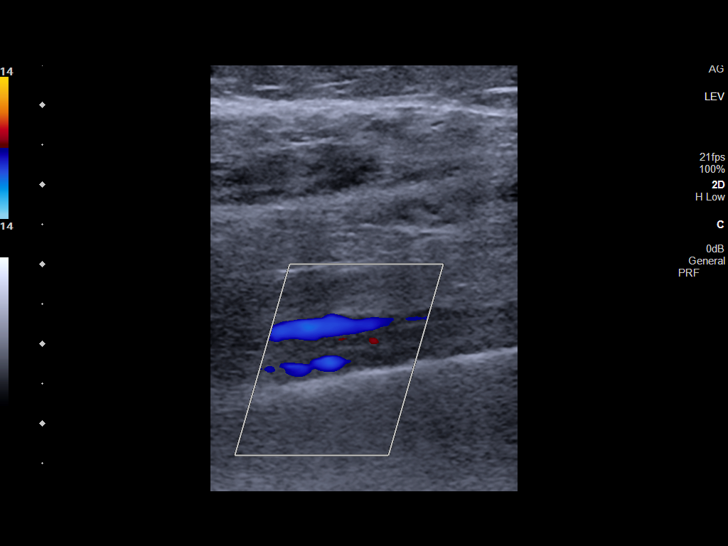

[13 of 24 positions shown; findings below may reference images not displayed]

FINDINGS: Contralateral Common Femoral Vein: Respiratory phasicity is normal
and symmetric with the symptomatic side. No evidence of thrombus.
Normal compressibility.

Common Femoral Vein: No evidence of thrombus. Normal
compressibility, respiratory phasicity and response to augmentation.

Saphenofemoral Junction: No evidence of thrombus. Normal
compressibility and flow on color Doppler imaging.

Profunda Femoral Vein: No evidence of thrombus. Normal
compressibility and flow on color Doppler imaging.

Femoral Vein: No evidence of thrombus. Normal compressibility,
respiratory phasicity and response to augmentation.

Popliteal Vein: No evidence of thrombus. Normal compressibility,
respiratory phasicity and response to augmentation.

Calf Veins: No evidence of thrombus. Normal compressibility and flow
on color Doppler imaging.
IMPRESSION: No evidence of deep venous thrombosis.

## 2022-02-15 ENCOUNTER — Encounter: Payer: Self-pay | Admitting: Radiation Oncology

## 2022-02-15 ENCOUNTER — Ambulatory Visit
Admission: RE | Admit: 2022-02-15 | Discharge: 2022-02-15 | Disposition: A | Discharge status: Home / Self Care | Payer: 59 | Source: Ambulatory Visit | Attending: Radiation Oncology | Admitting: Radiation Oncology

## 2022-02-15 VITALS — BP 159/101 | HR 101 | Temp 97.5°F | Resp 18 | Ht 65.0 in | Wt 182.0 lb

## 2022-02-15 DIAGNOSIS — Z79811 Long term (current) use of aromatase inhibitors: Secondary | ICD-10-CM | POA: Diagnosis not present

## 2022-02-15 DIAGNOSIS — Z9012 Acquired absence of left breast and nipple: Secondary | ICD-10-CM | POA: Diagnosis not present

## 2022-02-15 DIAGNOSIS — Z923 Personal history of irradiation: Secondary | ICD-10-CM | POA: Diagnosis not present

## 2022-02-15 DIAGNOSIS — C50411 Malignant neoplasm of upper-outer quadrant of right female breast: Secondary | ICD-10-CM | POA: Insufficient documentation

## 2022-02-15 DIAGNOSIS — Z9221 Personal history of antineoplastic chemotherapy: Secondary | ICD-10-CM | POA: Diagnosis not present

## 2022-02-15 DIAGNOSIS — Z170 Estrogen receptor positive status [ER+]: Secondary | ICD-10-CM | POA: Insufficient documentation

## 2022-02-15 DIAGNOSIS — C50812 Malignant neoplasm of overlapping sites of left female breast: Secondary | ICD-10-CM

## 2022-02-15 IMAGING — MG MM BREAST SURGICAL SPECIMEN
2 series · 2 of 2 positions shown · non-contrast
Comparison: Previous exam(s).

CLINICAL DATA: Specimen radiograph of the left axilla. Patient is
scheduled for a mastectomy.

EXAM:
SPECIMEN RADIOGRAPH OF THE LEFT AXILLA

[L (1 of 2)]
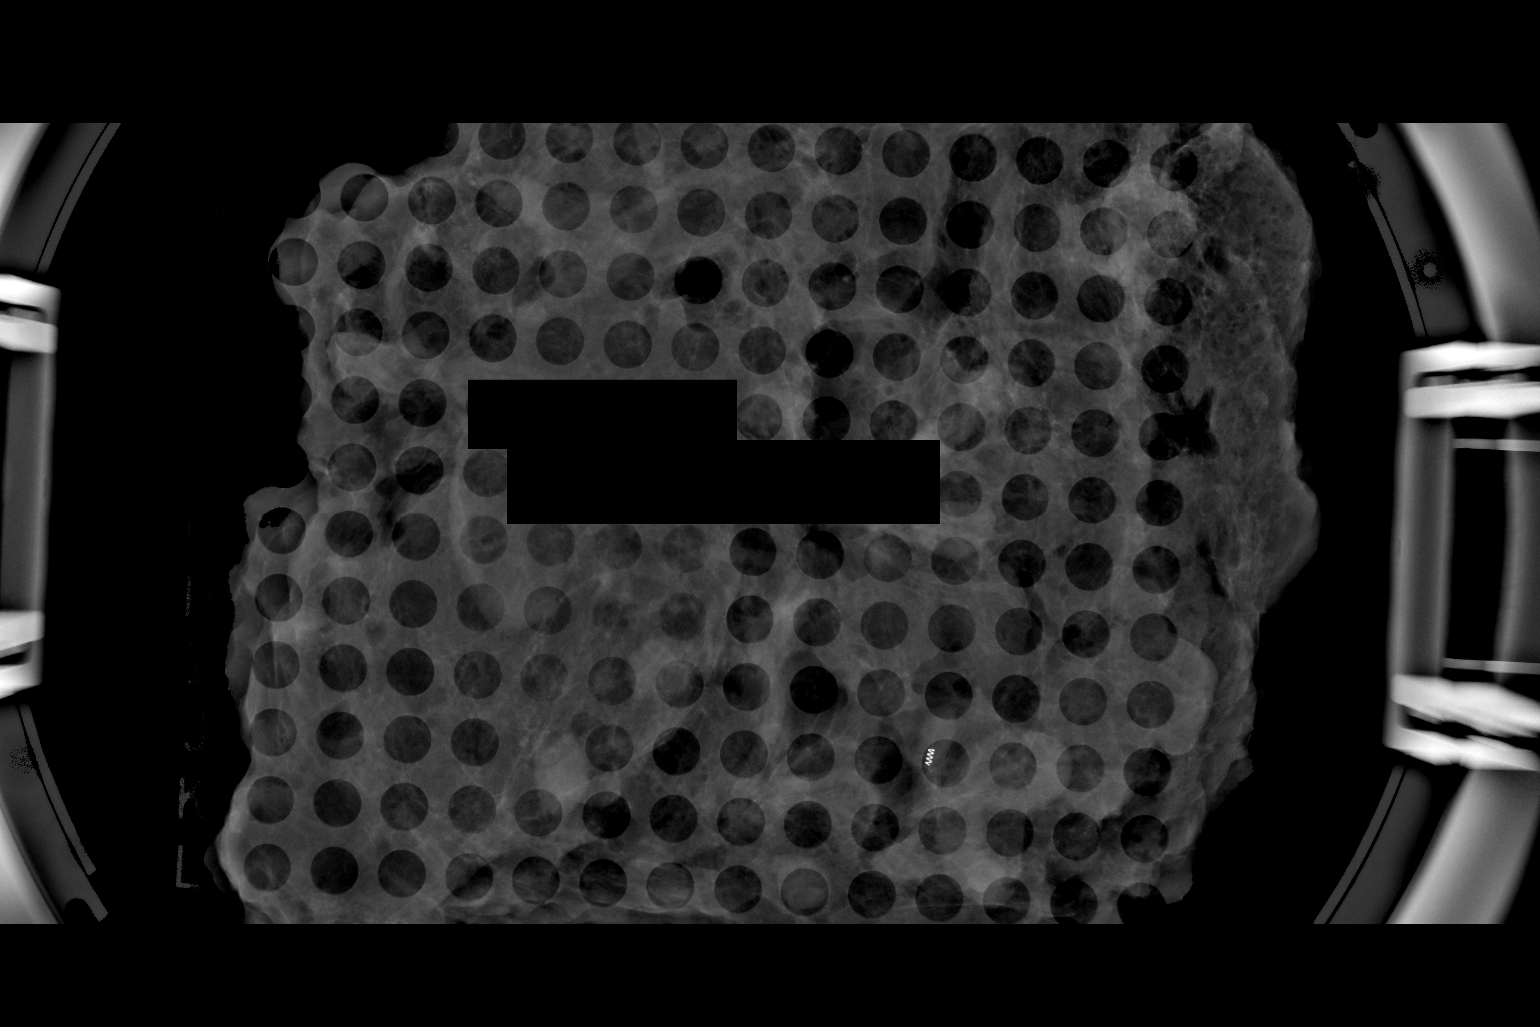

[L (2 of 2)]
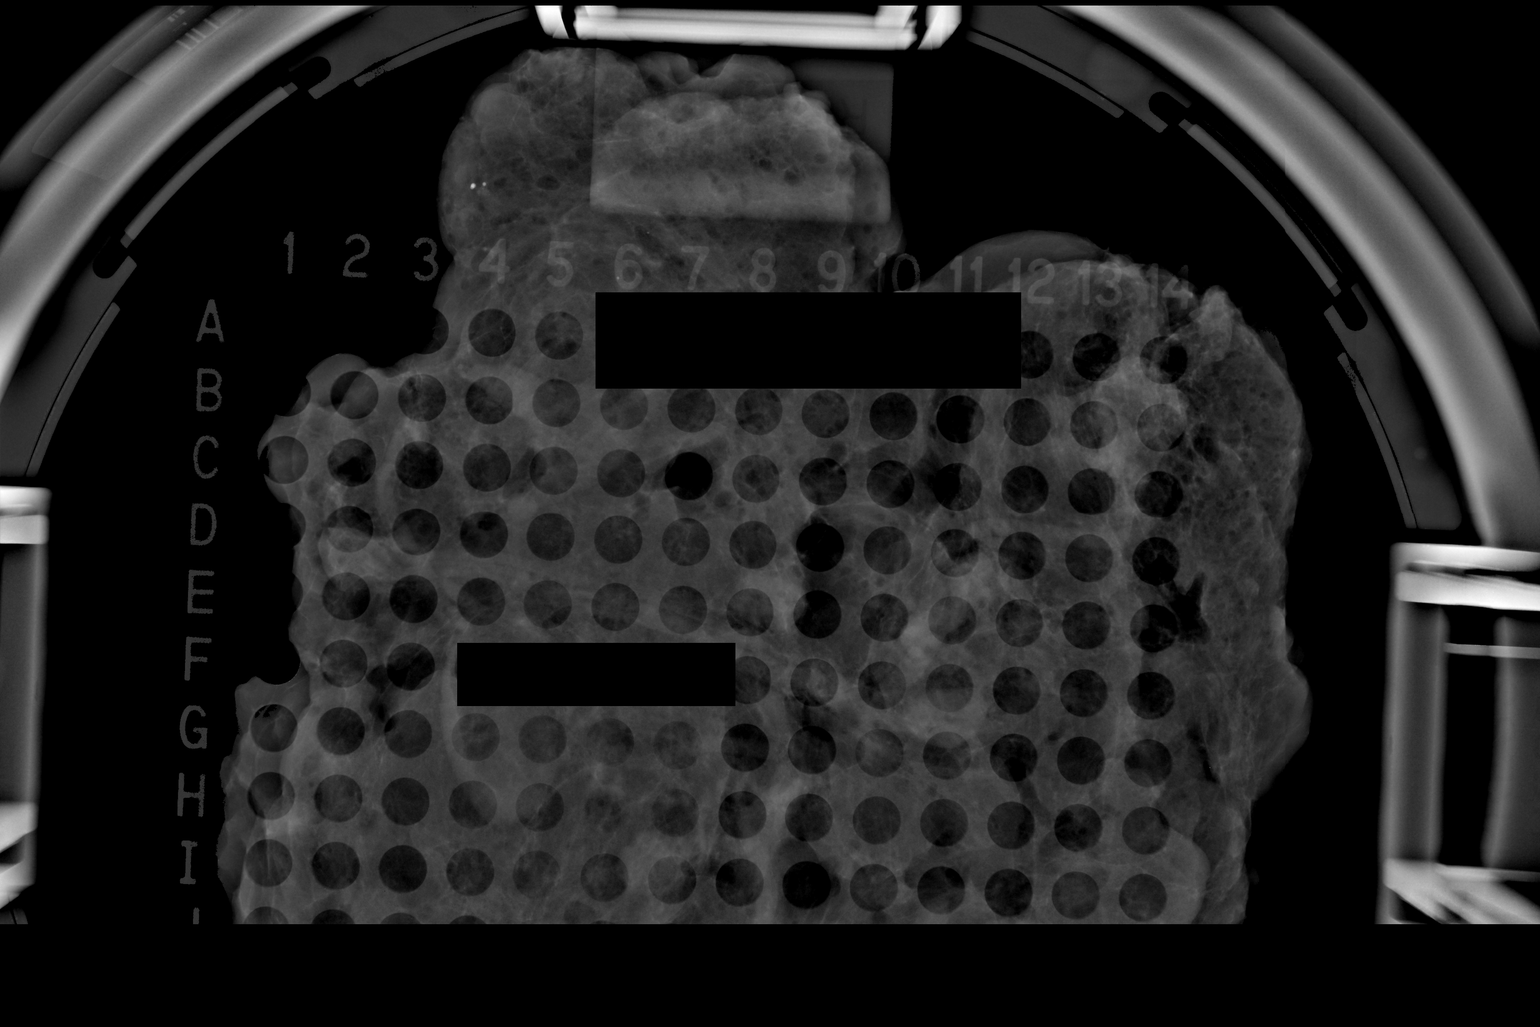

[2 of 2 positions shown; findings below may reference images not displayed]

FINDINGS: Status post excision of the left axilla. There is a HydroMARK clip
in the specimen radiograph.
IMPRESSION: Specimen radiograph of the left axilla.

## 2022-02-15 NOTE — Progress Notes (Signed)
Radiation Oncology ?Follow up Note ? ?Name: Michelle Walker   ?Date:   02/15/2022 ?MRN:  [id number] ?DOB: December 31, 1960  ? ? ?This 61 y.o. female presents to the clinic today for 39-monthfollow-up status post radiation therapy to her left chest wall and peripheral lymphatics for stage II (T2 N1 M0) ER/PR positive HER2 negative invasive mammary carcinoma status post neoadjuvant chemotherapy followed by left modified radical mastectomy. ? ?REFERRING PROVIDER: JCasilda Carls MD ? ?HPI: Patient is a 61year old female now out 6 months having completed left chest wall and peripheral lymphatic radiation for stage II ER/PR positive invasive mammary carcinoma status post neoadjuvant chemotherapy followed by left modified radical mastectomy.  She still having some occasional pain and tenderness in the left chest wall which I associate with scar tissue.  She is having no other significant complaints..  She has not had mammograms of her right breast since December that is being ordered by Dr. RJanese Banks  She is currently on Femara tolerating it well without side effect. ? ?COMPLICATIONS OF TREATMENT: none ? ?FOLLOW UP COMPLIANCE: keeps appointments  ? ?PHYSICAL EXAM:  ?BP (!) 159/101   Pulse (!) 101   Temp (!) 97.5 ?F (36.4 ?C) (Tympanic)   Resp 18   Ht '5\' 5"'  (1.651 m)   Wt 182 lb (82.6 kg)   BMI 30.29 kg/m?  ?Patient is status post left modified radical mastectomy.  No evidence of chest wall mass or nodularity is noted.  No evidence of lymphedema in her left upper extremity is noted.  Right breast is free of dominant mass.  Well-developed well-nourished patient in NAD. HEENT reveals PERLA, EOMI, discs not visualized.  Oral cavity is clear. No oral mucosal lesions are identified. Neck is clear without evidence of cervical or supraclavicular adenopathy. Lungs are clear to A&P. Cardiac examination is essentially unremarkable with regular rate and rhythm without murmur rub or thrill. Abdomen is benign with no organomegaly or masses  noted. Motor sensory and DTR levels are equal and symmetric in the upper and lower extremities. Cranial nerves II through XII are grossly intact. Proprioception is intact. No peripheral adenopathy or edema is identified. No motor or sensory levels are noted. Crude visual fields are within normal range. ? ?RADIOLOGY RESULTS: No current films for review ? ?PLAN: At the present time patient is doing well with no evidence of disease now at 6 months having completed left chest wall radiation therapy.  And pleased with her overall progress.  She continues on Femara without side effect.  She will have mammograms ordered by medical oncology of her right breast.  I have asked to see her back in 6 months for follow-up and then will start once year follow-up appointments.  Patient is to call with any concerns. ? ?I would like to take this opportunity to thank you for allowing me to participate in the care of your patient.. ?  ? GNoreene Filbert MD ? ?

## 2022-02-16 ENCOUNTER — Ambulatory Visit: Payer: 59 | Attending: Hospice and Palliative Medicine | Admitting: Physical Therapy

## 2022-02-16 ENCOUNTER — Encounter: Payer: Self-pay | Admitting: Physical Therapy

## 2022-02-16 DIAGNOSIS — M6281 Muscle weakness (generalized): Secondary | ICD-10-CM | POA: Diagnosis present

## 2022-02-16 DIAGNOSIS — R531 Weakness: Secondary | ICD-10-CM | POA: Diagnosis not present

## 2022-02-16 DIAGNOSIS — R2681 Unsteadiness on feet: Secondary | ICD-10-CM | POA: Diagnosis present

## 2022-02-16 NOTE — Therapy (Signed)
[facility] ?[address] ?[place], [place], [address] ?Phone: [phone]   Fax:  [phone] ? ?Physical Therapy Evaluation ? ?Patient Details  ?Name: Michelle Walker ?MRN: [id number] ?Date of Birth: 1961/10/04 ?No data recorded ? ?Encounter Date: 02/16/2022 ? ? PT End of Session - 02/16/22 1529   ? ? Visit Number 1   ? Number of Visits 17   ? Date for PT Re-Evaluation 04/13/22   ? Progress Note Due on Visit 10   ? PT Start Time [phone]   ? PT Stop Time [phone]   ? PT Time Calculation (min) 44 min   ? Equipment Utilized During Treatment Gait belt   ? Activity Tolerance Patient tolerated treatment well;Patient limited by pain   ? Behavior During Therapy [facility] for tasks assessed/performed   ? ?  ?  ? ?  ? ? ?Past Medical History:  ?Diagnosis Date  ? Anemia   ? Arthritis   ? Breast cancer ([place]) 08/2015  ? 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 Wide excision/ RT, T1a,N0. ER/PR pos, Her 2.neg. Tamoxifen x 6 months, d/c secondary to vasomotor sympotms.   ? Headache   ? Hypertension   ? Peripheral neuropathy   ? Personal history of radiation therapy 2016  ? RIGHT lumpectomy  ? Thyroid disease   ? ? ?Past Surgical History:  ?Procedure Laterality Date  ? ANTERIOR CRUCIATE LIGAMENT REPAIR Right   ? BREAST BIOPSY Right 07/17/2015  ? INVASIVE LOBULAR CARCINOMA, CLASSIC TYPE (1.5 MM), ARISING IN A   ? BREAST BIOPSY Left 09/19/2020  ? [place] bx 3 areas/ 1oc q clip/ 12 oc vision clip, axilla lymph node hydromark shape 3/ path pending  ? BREAST LUMPECTOMY Right 08/22/2015  ? BREAST LUMPECTOMY WITH SENTINEL LYMPH NODE BIOPSY Right 08/22/2015  ? Procedure: BREAST LUMPECTOMY WITH SENTINEL LYMPH NODE BX;  Surgeon: Christene Lye, MD;  Location: [facility];  Service: General;  Laterality: Right;  ? COLONOSCOPY W/ POLYPECTOMY  2014  ? COLONOSCOPY WITH PROPOFOL N/A 04/16/2019  ? Procedure: COLONOSCOPY WITH BIOPSIES;  Surgeon: Lucilla Lame, MD;  Location: [facility];  Service: Endoscopy;  Laterality: N/A;  ? DILATION AND CURETTAGE OF UTERUS    ? 20 years ago  ? MASTECTOMY Left   ? 2021  ? NOVASURE ABLATION    ? POLYPECTOMY N/A 04/16/2019  ? Procedure: POLYPECTOMY;  Surgeon: Lucilla Lame, MD;  Location: [place];  Service: Endoscopy;  Laterality: N/A;  ? PORTACATH PLACEMENT Right 12/01/2020  ? Procedure: INSERTION PORT-A-CATH;  Surgeon: [name], MD;  Location: [facility];  Service: General;  Laterality: Right;  ? TOTAL MASTECTOMY Left 05/06/2021  ? Procedure: modified radical mastectomy;  Surgeon: [name], MD;  Location: [facility];  Service: General;  Laterality: Left;  Provider requesting 1.5 hours / 90 minutes for procedure  ? ? ?There were no vitals filed for this visit. ? ? ? Subjective Assessment - 02/16/22 1643   ? ? Subjective Chemo-induced neuropathy   ? Pertinent History Patient arrives with report of chemo-induced peripheral neuropathy affecting BLE, right worse than left. Pt was diagnosed with breast cancer last year and treated using chemotherapy starting in 12/2020. She states that it initially effected her hands and feet however has resolved in the hands. Her nails also turned dark in color - fingernails have grown out to normal color however toenails remain dark (associated with chemo and sensation changes). Patient endorses 1 fall over the  past 6 months that occurred while walking through her home; she states she lost her balance and is now very careful. Now she looks down while walking due to feeling of imbalance. She does not use a cane or AD. She states turning her head while walking also seems to affect her balance. When she walks or stands for > 1 hour, she feels a pull in her R groin. 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 She is unsure how long her job will allow her to maintain this position.   ?  Limitations Standing;House hold activities;Walking;Lifting   ? How long can you sit comfortably? unlimited   ? How long can you stand comfortably? <1 hour   ? How long can you walk comfortably? <1 hour   ? Patient Stated Goals to get 100% use and strength back into the legs   ? Currently in Pain? No/denies   ? ?  ?  ? ?  ? ?eval and treat for neuropathy, RLE pain, weakness and balance ? ?SUBJECTIVE ?Chief complaint: Patient arrives with report of chemo-induced peripheral neuropathy affecting BLE, right worse than left. Pt was diagnosed with breast cancer last year and treated using chemotherapy starting in 12/2020. She states that it initially effected her hands and feet however has resolved in the hands. Her nails also turned dark in color - fingernails have grown out to normal color however toenails remain dark (associated with chemo and sensation changes). Patient endorses 1 fall over the past 6 months that occurred while walking through her home; she states she lost her balance and is now very careful. Now she looks down while walking due to feeling of imbalance. She does not use a cane or AD. She states turning her head while walking also seems to affect her balance. When she walks or stands for > 1 hour, she feels a pull in her R groin. 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 She is unsure how long her job will allow her to maintain this position. ? ?History: recurrent stage II left breast cancer, DM2, HTN, peripheral neuropathy, bursitis R hip, anxiety and depression. ?Red flags: history of cancer, night sweats (reports she sweats all the time due to menopause)  ?Referring Dx: peripheral neuropathy, RLE pain, weakness and balance  ?Referring Provider: Altha Harm, NP ?Recent changes in overall health/medication: No - entered remission following radiation in 10/22. Awaiting pap smear  results.  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 Uses hand jack to move palette that weighs 500-800 pounds - she does have help most times due to trouble with balance. She works the night shift. Full-duty position involves lifting up to 45#. She is not sure how long she can remain in this temporary position before returning to full-duty. Pt reports lifting restrictions but unable to provide details on specific weight and duration of restriction.  ?Hobbies: enjoys playing with her 3 grand kids, loves to cook but is limited by fatigue ?Goals: to get 100% use and strength back into the legs.  ? ? ?OBJECTIVE ? ?Musculoskeletal ?Tremor:  Absent ?Bulk: Normal ?Tone: Normal, no clonus ? ?Posture ?No gross abnormalities noted in seated posture. Decreased weight shift onto the right LE in standing.  ? ? ?Gait ?Ataxic gait with wide BOS, irregular foot placement, foot drop; decreased toe off and heel strike bilaterally; decreased stance time on RLE leading to decreased left stride length. Gait is unstable at self-selected and increased velocities.  ? ? ?Strength ?R/L ?2/4- Hip flexion ?3-/3 Hip extension  ?4-/4 Hip abduction ?5/5 Hip adduction ?4-/4 Knee extension ?4+/4+ Knee flexion ?2/3 Ankle Plantarflexion ?2/3 Ankle Dorsiflexion ? ? ?NEUROLOGICAL: ? ?Sensation ?Stocking distribution - decreased sensation in bilateral feet and lower legs. Extends higher in RLE.  ? ? ? ?FUNCTIONAL OUTCOME MEASURES ? ? Results Comments  ?FGA 12/30   ?TUG __ seconds Assess next session  ?5TSTS 39 seconds   ?6 Minute Walk Test __ ft Assess next session  ?10 Meter Gait Speed Self-selected: 24.4s = 0.18ms;  ?Fastest: 12.3s = 0.838m Below normative values for full community  ambulation  ? ? ? ? ? ?ASSESSMENT ?Clinical Impression: Pt is a pleasant [age]ear-old female referred for difficulty with balance related to chemo-induced peripheral neuropathy.  PT examination reveals deficits in

## 2022-02-17 ENCOUNTER — Ambulatory Visit: Payer: 59 | Admitting: Obstetrics and Gynecology

## 2022-02-17 ENCOUNTER — Encounter: Payer: Self-pay | Admitting: Obstetrics and Gynecology

## 2022-02-17 VITALS — BP 138/94 | HR 118 | Ht 65.0 in | Wt 180.8 lb

## 2022-02-17 DIAGNOSIS — D069 Carcinoma in situ of cervix, unspecified: Secondary | ICD-10-CM

## 2022-02-17 NOTE — Progress Notes (Signed)
Patient presents today for Colposcopy follow-up.  ?She states she has been doing well and had no issues after her procedure. Patient states no other questions or concerns at this time. ?

## 2022-02-17 NOTE — Progress Notes (Signed)
HPI: ?     [name] is a 61 y.o. [phone] who LMP was No LMP recorded. Patient has had an ablation. ? ?Subjective:  ? ?She presents today for colposcopy follow-up.  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 ?Of significant note patient has a history of breast cancer X2. ? ?  Hx: ?The following portions of the patient's history were reviewed and updated as appropriate: ?            She  has a past medical history of Anemia, Arthritis, Breast cancer ([place]) (08/2015), Headache, Hypertension, Peripheral neuropathy, Personal history of radiation therapy (2016), and Thyroid disease. ?She does not have any pertinent problems on file. ?She  has a past surgical history that includes Colonoscopy w/ polypectomy (2014); Anterior cruciate ligament repair (Right); Novasure ablation; Breast lumpectomy with sentinel lymph node bx (Right, 08/22/2015); Dilation and curettage of uterus; Colonoscopy with propofol (N/A, 04/16/2019); polypectomy (N/A, 04/16/2019); Breast lumpectomy (Right, 08/22/2015); Breast biopsy (Right, 07/17/2015); Breast biopsy (Left, 09/19/2020); Portacath placement (Right, 12/01/2020); Total mastectomy (Left, 05/06/2021); and Mastectomy (Left). ?Her family history includes Cancer in her sister; Diabetes in her brother and sister; Stroke in her mother and sister. ?She  reports that she has been smoking cigarettes. She has a 30.00 pack-year smoking history. She has never used smokeless tobacco. She reports current alcohol use of about 2.0 standard drinks per week. She reports that she does not use drugs. ?She has a current medication list which includes the following prescription(s): allopurinol, amlodipine, calcium carb-cholecalciferol, duloxetine, gabapentin, hydrochlorothiazide, letrozole, losartan, and metoprolol succinate, and the following Facility-Administered Medications: sodium chloride flush. ?She is allergic to triamcinolone and tape. ?      ?Review of Systems:  ?Review of  Systems ? ?Constitutional: Denied constitutional symptoms, night sweats, recent illness, fatigue, fever, insomnia and weight loss.  ?Eyes: Denied eye symptoms, eye pain, photophobia, vision change and visual disturbance.  ?Ears/Nose/Throat/Neck: Denied ear, nose, throat or neck symptoms, hearing loss, nasal discharge, sinus congestion and sore throat.  ?Cardiovascular: Denied cardiovascular symptoms, arrhythmia, chest pain/pressure, edema, exercise intolerance, orthopnea and palpitations.  ?Respiratory: Denied pulmonary symptoms, asthma, pleuritic pain, productive sputum, cough, dyspnea and wheezing.  ?Gastrointestinal: Denied, gastro-esophageal reflux, melena, nausea and vomiting.  ?Genitourinary: Denied genitourinary symptoms including symptomatic vaginal discharge, pelvic relaxation issues, and urinary complaints.  ?Musculoskeletal: Denied musculoskeletal symptoms, stiffness, swelling, muscle weakness and myalgia.  ?Dermatologic: Denied dermatology symptoms, rash and scar.  ?Neurologic: Denied neurology symptoms, dizziness, headache, neck pain and syncope.  ?Psychiatric: Denied psychiatric symptoms, anxiety and depression.  ?Endocrine: Denied endocrine symptoms including hot flashes and night sweats.  ? ?Meds: ?  ?Current Outpatient Medications on File Prior to Visit  ?Medication Sig Dispense Refill  ? allopurinol (ZYLOPRIM) 100 MG tablet Take 100 mg by mouth at bedtime.    ? amLODipine (NORVASC) 10 MG tablet Take 10 mg by mouth at bedtime.   0  ? Calcium Carb-Cholecalciferol (CALCIUM 600+D3 PO) Take 1 tablet by mouth at bedtime.    ? DULoxetine (CYMBALTA) 60 MG capsule Take 1 capsule (60 mg total) by mouth daily. 90 capsule 1  ? gabapentin (NEURONTIN) 600 MG tablet Take 1 tablet (600 mg total) by mouth 3 (three) times daily. 90 tablet 3  ? hydrochlorothiazide (HYDRODIURIL) 25 MG tablet Take 25 mg by mouth daily.    ? letrozole (FEMARA) 2.5 MG tablet TAKE 1 TABLET(2.5 MG) BY MOUTH DAILY(START AFTER RADIATION IS  OVER) 30 tablet 3  ? losartan (COZAAR) 100 MG tablet Take 1 tablet by  mouth daily.    ? metoprolol succinate (TOPROL-XL) 25 MG 24 hr tablet Take 50 mg by mouth at bedtime.  0  ? ?Current Facility-Administered Medications on File Prior to Visit  ?Medication Dose Route Frequency Provider Last Rate Last Admin  ? sodium chloride flush (NS) 0.9 % injection 10 mL  10 mL Intravenous PRN Sindy Guadeloupe, MD   10 mL at 03/27/21 0813  ? ? ? ? ?Objective:  ?  ? ?Vitals:  ? 02/17/22 0853  ?BP: (!) 138/94  ?Pulse: (!) 118  ? ?Filed Weights  ? 02/17/22 0853  ?Weight: 180 lb 12.8 oz (82 kg)  ? ?  ?          ?        ? ?Assessment:  ?  ?G2P2002 ?Patient Active Problem List  ? Diagnosis Date Noted  ? Trochanteric bursitis of right hip 09/10/2021  ? Pain in joint of right hip 09/10/2021  ? S/P mastectomy, left 05/12/2021  ? Breast cancer metastasized to axillary lymph node, right ([place]) 05/06/2021  ? Prophylaxis for chemotherapy-induced neutropenia 01/16/2021  ? Genetic testing 11/26/2020  ? Goals of care, counseling/discussion 11/04/2020  ? Breast cancer, left ([place]) 11/04/2020  ? History of colonic polyps   ? Polyp of ascending colon   ? Depression 09/20/2017  ? DM2 (diabetes mellitus, type 2) ([place]) 09/20/2017  ? Gout 09/20/2017  ? HTN (hypertension) 09/20/2017  ? Migraine 09/20/2017  ? History of endometrial ablation 02/26/2016  ? Malignant neoplasm of upper-outer quadrant of left breast in female, estrogen receptor positive ([place]) 08/04/2015  ? ?  ?1. CIN III (cervical intraepithelial neoplasia grade III) with severe dysplasia   ? ? By colposcopically directed biopsies-this is in the endocervical canal. ? ? ?Plan:  ?  ?       ? 1.  I discussed 2 options with the patient.  The first being LEEP with close follow-up of Pap smears and colposcopies as necessary.  I have told her that 92% of the people who have LEEP do not have any further cervical abnormalities.  The second option of course is hysterectomy for CIN-3 of the endocervical  canal.  Patient states that she would like to have a hysterectomy.  The difference between LEEP and hysterectomy as far as severity of procedure was discussed in detail.  We have also briefly discussed the possibility of oophorectomy because of her breast cancer history and the fact that she is menopausal and has little to no ovarian function at this time.  All her questions were answered.  I have asked her to continue to think on this and if she so desires she may schedule a preop within the next 2weeks to 2 months. ? ?Orders ?No orders of the defined types were placed in this encounter. ? ? No orders of the defined types were placed in this encounter. ?  ?  F/U ? No follow-ups on file. ?I spent 23 minutes involved in the care of this patient preparing to see the patient by obtaining and reviewing her medical history (including labs, imaging tests and prior procedures), documenting clinical information in the electronic health record (EHR), counseling and coordinating care plans, writing and sending prescriptions, ordering tests or procedures and in direct communicating with the patient and medical staff discussing pertinent items from her history and physical exam. ? ?Finis Bud, M.D. ?02/17/2022 ?9:35 AM ? ? ? ? ?

## 2022-02-18 ENCOUNTER — Ambulatory Visit: Payer: 59 | Admitting: Physical Therapy

## 2022-02-18 NOTE — Patient Instructions (Incomplete)
TUG:  ?6 minute walk:  ? ?General LE strengthening ?All standing exercises performed with UE support: ?-standing hip flexion march ?-standing hip abduction  ?-standing hip extension ?-standing hamstring curl ?-seated heel raise ?-standing heel raise  ? ?Balance - static and dynamic ? ?HEP: ? ? ?

## 2022-02-24 ENCOUNTER — Ambulatory Visit: Payer: 59 | Admitting: Physical Therapy

## 2022-02-24 ENCOUNTER — Encounter: Payer: Self-pay | Admitting: Physical Therapy

## 2022-02-24 DIAGNOSIS — R2681 Unsteadiness on feet: Secondary | ICD-10-CM

## 2022-02-24 DIAGNOSIS — M6281 Muscle weakness (generalized): Secondary | ICD-10-CM

## 2022-02-24 NOTE — Therapy (Signed)
[place] [facility] ?[address] ?[place], [place], [address] ?Phone: [phone]   Fax:  [phone] ? ?Physical Therapy Treatment ? ?Patient Details  ?Name: Michelle Walker ?MRN: [id number] ?Date of Birth: 02-21-1961 ?No data recorded ? ?Encounter Date: 02/24/2022 ? ? PT End of Session - 02/24/22 0841   ? ? Visit Number 2   ? Number of Visits 17   ? Date for PT Re-Evaluation 04/13/22   ? Progress Note Due on Visit 10   ? PT Start Time 0830   ? PT Stop Time 1941   ? PT Time Calculation (min) 45 min   ? Equipment Utilized During Treatment Gait belt   ? Activity Tolerance Patient tolerated treatment well;Patient limited by pain   ? Behavior During Therapy [facility] for tasks assessed/performed   ? ?  ?  ? ?  ? ? ?Past Medical History:  ?Diagnosis Date  ? Anemia   ? Arthritis   ? Breast cancer ([place]) 08/2015  ? 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 Wide excision/ RT, T1a,N0. ER/PR pos, Her 2.neg. Tamoxifen x 6 months, d/c secondary to vasomotor sympotms.   ? Headache   ? Hypertension   ? Peripheral neuropathy   ? Personal history of radiation therapy 2016  ? RIGHT lumpectomy  ? Thyroid disease   ? ? ?Past Surgical History:  ?Procedure Laterality Date  ? ANTERIOR CRUCIATE LIGAMENT REPAIR Right   ? BREAST BIOPSY Right 07/17/2015  ? INVASIVE LOBULAR CARCINOMA, CLASSIC TYPE (1.5 MM), ARISING IN A   ? BREAST BIOPSY Left 09/19/2020  ? [place] bx 3 areas/ 1oc q clip/ 12 oc vision clip, axilla lymph node hydromark shape 3/ path pending  ? BREAST LUMPECTOMY Right 08/22/2015  ? BREAST LUMPECTOMY WITH SENTINEL LYMPH NODE BIOPSY Right 08/22/2015  ? Procedure: BREAST LUMPECTOMY WITH SENTINEL LYMPH NODE BX;  Surgeon: Christene Lye, MD;  Location: [facility];  Service: General;  Laterality: Right;  ? COLONOSCOPY W/ POLYPECTOMY  2014  ? COLONOSCOPY WITH PROPOFOL N/A 04/16/2019  ? Procedure: COLONOSCOPY WITH BIOPSIES;  Surgeon: Lucilla Lame, MD;  Location: [place];  Service: Endoscopy;  Laterality: N/A;  ? DILATION AND CURETTAGE OF UTERUS    ? 20 years ago  ? MASTECTOMY Left   ? 2021  ? NOVASURE ABLATION    ? POLYPECTOMY N/A 04/16/2019  ? Procedure: POLYPECTOMY;  Surgeon: Lucilla Lame, MD;  Location: [place];  Service: Endoscopy;  Laterality: N/A;  ? PORTACATH PLACEMENT Right 12/01/2020  ? Procedure: INSERTION PORT-A-CATH;  Surgeon: [name], MD;  Location: [facility];  Service: General;  Laterality: Right;  ? TOTAL MASTECTOMY Left 05/06/2021  ? Procedure: modified radical mastectomy;  Surgeon: [name], MD;  Location: [facility];  Service: General;  Laterality: Left;  Provider requesting 1.5 hours / 90 minutes for procedure  ? ? ?There were no vitals filed for this visit. ? ? Subjective Assessment - 02/24/22 0832   ? ? Subjective Pt states she is doing well today. She has a pre-op appointment for a hysterectomy on 5/3. She is experiencing a lot of stress within the family at this time causing her to miss her last session.   ? Pertinent History Patient arrives with report of chemo-induced peripheral neuropathy affecting BLE, right worse than left. Pt was diagnosed with breast cancer last year and treated using chemotherapy starting in 12/2020. She states that it initially effected her hands and feet however has resolved  in the hands. Her nails also turned dark in color - fingernails have grown out to normal color however toenails remain dark (associated with chemo and sensation changes). Patient endorses 1 fall over the past 6 months that occurred while walking through her home; she states she lost her balance and is now very careful. Now she looks down while walking due to feeling of imbalance. She does not use a cane or AD. She states turning her head while walking also seems to affect her balance. When she walks or stands for > 1 hour, she feels a pull in her R groin. 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 She is unsure how long her job will allow her to maintain this position.   ? Limitations Standing;House hold activities;Walking;Lifting   ? How long can you sit comfortably? unlimited   ? How long can you stand comfortably? <1 hour   ? How long can you walk comfortably? <1 hour   ? Patient Stated Goals to get 100% use and strength back into the legs   ? Currently in Pain? Yes   ? Pain Score 7    ? Pain Location Foot   ? Pain Orientation Right   ? Pain Descriptors / Indicators Shooting;Numbness;Pins and needles   ? ?  ?  ? ?  ? ? ? ? ?OBJECTIVE:  ?6 minute walk test: 1037f - 315 meters (goal 538 meters) ? - experienced low back pain and lateral RLE pain  ? - decreased stance time RLE ?TUG: 15.48 seconds w/ hands (14 seconds for decreased fall risk) ? ? ?INTERVENTIONS: ?- calf stretch at wall, 2x30 seconds ?- lumbar trunk rotations, 2x30 seconds each  ?- single knee to chest, 2x30 seconds each  ?- standing march 2x10 each side  ?- standing hip extension, 2x10 each side  ?- standing hip abduction, 2x10 each side ?- standing heel raises, 2x10 ?- standing toe raises, 2x10 - performed 1 side at a time  ?- tandem stance, 2x30 seconds each side  ? ? ? ? ?Access Code: [email or web address]? ? ? ? ?Clinical Impression: Patient is pleasant and motivated throughout session. She is having significant nerve pain in both feet, right worse than left; she gives her best effort with all interventions. Demonstrates room for improvement in both 6 minute walk test and TUG. HEP for general LE strengthening and balance was created and performed within session. Pt fatigued by end. Patient will benefit from continued PT to address strength and balance deficits and decrease risk of future falls in order to play with her grand kids safely and return to full-duty at work.  ? ? ? ? ? ? PT Short Term Goals - 02/16/22 1648   ? ?  ? PT SHORT TERM GOAL #1  ? Title Pt will be  independent with HEP in order to improve strength and balance in order to decrease fall risk and improve function at home and work.   ? Time 4   ? Period Weeks   ? Status New   ? Target Date 03/16/22   ? ?  ?  ? ?  ? ? ? ? PT Long Term Goals - 02/16/22 1649   ? ?  ? PT LONG TERM GOAL #1  ? Title Patient will increase FOTO score to equal to or greater than 61 to demonstrate statistically significant improvement in mobility and quality of  life.   ? Baseline 02/16/22: 51   ? Time 8   ? Period Weeks   ? Status New   ? Target Date 04/13/22   ?  ? PT LONG TERM GOAL #2  ? Title Pt will improve FGA score to at least 22/30 to demonstrate improvement in balance and decrease in fall risk at home and in community.   ? Baseline 02/16/22: 12/30   ? Time 8   ? Period Weeks   ? Status New   ? Target Date 04/13/22   ?  ? PT LONG TERM GOAL #3  ? Title Patient will complete five times sit to stand test in <10 seconds indicating an increased LE strength and improved balance.   ? Baseline 02/16/22: 39 seconds   ? Time 8   ? Period Weeks   ? Status New   ? Target Date 04/13/22   ?  ? PT LONG TERM GOAL #4  ? Title Patient will increase 10 meter walk test to >1.47ms as to improve gait speed for better community ambulation and to reduce fall risk.   ? Baseline 02/16/22: self-selected 0.457m, fast 0.8166m  ? Time 8   ? Period Weeks   ? Status New   ? Target Date 04/13/22   ? ?  ?  ? ?  ? ? ? ? ? ? ? ? Plan - 02/25/22 1158   ? ? Clinical Impression Statement Patient is pleasant and motivated throughout session. She is having significant nerve pain in both feet, right worse than left; she gives her best effort with all interventions. Demonstrates room for improvement in both 6 minute walk test and TUG. HEP for general LE strengthening and balance was created and performed within session. Pt fatigued by end. Patient will benefit from continued PT to address strength and balance deficits and decrease risk of future falls in order to play with her  grand kids safely and return to full-duty at work.   ? Personal Factors and Comorbidities Time since onset of injury/illness/exacerbation;Behavior Pattern;Past/Current Experience;Comorbidity 3+   ? C

## 2022-02-26 ENCOUNTER — Ambulatory Visit: Payer: 59 | Admitting: Physical Therapy

## 2022-02-26 ENCOUNTER — Encounter: Payer: Self-pay | Admitting: Physical Therapy

## 2022-02-26 DIAGNOSIS — R2681 Unsteadiness on feet: Secondary | ICD-10-CM

## 2022-02-26 DIAGNOSIS — M6281 Muscle weakness (generalized): Secondary | ICD-10-CM

## 2022-02-26 NOTE — Therapy (Signed)
[place] [facility] ?[address] ?[place], [place], [address] ?Phone: [phone]   Fax:  [phone] ? ?Physical Therapy Treatment ? ?Patient Details  ?Name: Michelle Walker ?MRN: [id number] ?Date of Birth: 1961/03/27 ?No data recorded ? ?Encounter Date: 02/26/2022 ? ? PT End of Session - 02/26/22 1779   ? ? Visit Number 3   ? Number of Visits 17   ? Date for PT Re-Evaluation 04/13/22   ? Progress Note Due on Visit 10   ? PT Start Time [phone]   ? PT Stop Time 0830   ? PT Time Calculation (min) 43 min   ? Equipment Utilized During Treatment Gait belt   ? Activity Tolerance Patient tolerated treatment well;Patient limited by pain   ? Behavior During Therapy [facility] for tasks assessed/performed   ? ?  ?  ? ?  ? ? ?Past Medical History:  ?Diagnosis Date  ? Anemia   ? Arthritis   ? Breast cancer ([place]) 08/2015  ? 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 Wide excision/ RT, T1a,N0. ER/PR pos, Her 2.neg. Tamoxifen x 6 months, d/c secondary to vasomotor sympotms.   ? Headache   ? Hypertension   ? Peripheral neuropathy   ? Personal history of radiation therapy 2016  ? RIGHT lumpectomy  ? Thyroid disease   ? ? ?Past Surgical History:  ?Procedure Laterality Date  ? ANTERIOR CRUCIATE LIGAMENT REPAIR Right   ? BREAST BIOPSY Right 07/17/2015  ? INVASIVE LOBULAR CARCINOMA, CLASSIC TYPE (1.5 MM), ARISING IN A   ? BREAST BIOPSY Left 09/19/2020  ? [place] bx 3 areas/ 1oc q clip/ 12 oc vision clip, axilla lymph node hydromark shape 3/ path pending  ? BREAST LUMPECTOMY Right 08/22/2015  ? BREAST LUMPECTOMY WITH SENTINEL LYMPH NODE BIOPSY Right 08/22/2015  ? Procedure: BREAST LUMPECTOMY WITH SENTINEL LYMPH NODE BX;  Surgeon: Christene Lye, MD;  Location: [facility];  Service: General;  Laterality: Right;  ? COLONOSCOPY W/ POLYPECTOMY  2014  ? COLONOSCOPY WITH PROPOFOL N/A 04/16/2019  ? Procedure: COLONOSCOPY WITH BIOPSIES;  Surgeon: Lucilla Lame, MD;  Location: [place];  Service: Endoscopy;  Laterality: N/A;  ? DILATION AND CURETTAGE OF UTERUS    ? 20 years ago  ? MASTECTOMY Left   ? 2021  ? NOVASURE ABLATION    ? POLYPECTOMY N/A 04/16/2019  ? Procedure: POLYPECTOMY;  Surgeon: Lucilla Lame, MD;  Location: [place];  Service: Endoscopy;  Laterality: N/A;  ? PORTACATH PLACEMENT Right 12/01/2020  ? Procedure: INSERTION PORT-A-CATH;  Surgeon: [name], MD;  Location: [facility];  Service: General;  Laterality: Right;  ? TOTAL MASTECTOMY Left 05/06/2021  ? Procedure: modified radical mastectomy;  Surgeon: [name], MD;  Location: [facility];  Service: General;  Laterality: Left;  Provider requesting 1.5 hours / 90 minutes for procedure  ? ? ?There were no vitals filed for this visit. ? ? Subjective Assessment - 02/26/22 0752   ? ? Subjective Pt states she is doing well today. She reports 7/10 pain in right foot and 4/10 in left foot. States she has had more tingling in her feet since starting PT. She worked last night.   ? Pertinent History Patient arrives with report of chemo-induced peripheral neuropathy affecting BLE, right worse than left. Pt was diagnosed with breast cancer last year and treated using chemotherapy starting in 12/2020. She states that it initially effected her hands and feet however has resolved in the  hands. Her nails also turned dark in color - fingernails have grown out to normal color however toenails remain dark (associated with chemo and sensation changes). Patient endorses 1 fall over the past 6 months that occurred while walking through her home; she states she lost her balance and is now very careful. Now she looks down while walking due to feeling of imbalance. She does not use a cane or AD. She states turning her head while walking also seems to affect her balance. When she walks or stands for > 1 hour, she feels a pull in her R groin. 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 She is unsure how long her job will allow her to maintain this position.   ? Limitations Standing;House hold activities;Walking;Lifting   ? How long can you sit comfortably? unlimited   ? How long can you stand comfortably? <1 hour   ? How long can you walk comfortably? <1 hour   ? Patient Stated Goals to get 100% use and strength back into the legs   ? Currently in Pain? Yes   ? Pain Score 7    ? Pain Location Foot   ? Pain Orientation Right   ? ?  ?  ? ?  ? ? ? ?  ?  ?INTERVENTIONS ? ?-NuStep warm-up at level 1 for 5 minutes  ? -one pause taken due to a shooting pain in her right foot  ? ?- PT performed desensitization to bilateral feet, gradually increasing discomfort; heated pillowcase > tissue > washcloth, all with light pressure. Pt reports heat helps to comfort the shooting pains in her feet so began process with a heating pad in the pillowcase. Total time: 12 minutes.  ?Education provided on performing at home, frequency and ideas on how to gradually increase discomfort. Pt is unable to reach feet so consistency of home performance may be impaired, husband will help when able.  ?  ?Ambulation 172f with: ? -change in gait speed - steady ? -alternating head turns - moderate challenge  ? ?STS from chair with 2 airex pads and no UE support, 2x10 ? -VC on technique  ? ?Static standing on airex pad, x60 seconds  ? -one posterior LOB with dual tasking of conversation  ? ?Step ups onto airex pad, x10 reps leading with each LE ? -VC on clearance  ?  ?  ?HEP Access Code: [email or web address](no updates) ?  ?  ?  ?  ?Clinical Impression: Patient is pleasant and motivated throughout session. PT introduced desensitization techniques to be used on bilateral feet due to nerve pain following chemo. She could benefit from desensitization to entire lower leg rather than just feet. She tolerated well with gentle progression of textured materials. Completed  session with strength and balance. Both STS and step up onto airex pad improved with repetitions. Will continue to progress POC as appropriate. Patient will benefit from continued PT to address strength and balance deficits and decrease risk of future falls in order to play with her grand kids safely and return to full-duty at work.  ? ? ? ? ? ? ? ? PT Short Term Goals - 02/16/22 1648   ? ?  ? PT SHORT TERM GOAL #1  ? Title Pt will be independent with HEP in order to improve strength and balance in order to decrease fall risk and improve function at home and work.   ?  Time 4   ? Period Weeks   ? Status New   ? Target Date 03/16/22   ? ?  ?  ? ?  ? ? ? ? PT Long Term Goals - 02/26/22 1106   ? ?  ? PT LONG TERM GOAL #1  ? Title Patient will increase FOTO score to equal to or greater than 61 to demonstrate statistically significant improvement in mobility and quality of life.   ? Baseline 02/16/22: 51   ? Time 8   ? Period Weeks   ? Status New   ? Target Date 04/13/22   ?  ? PT LONG TERM GOAL #2  ? Title Pt will improve FGA score to at least 22/30 to demonstrate improvement in balance and decrease in fall risk at home and in community.   ? Baseline 02/16/22: 12/30   ? Time 8   ? Period Weeks   ? Status New   ? Target Date 04/13/22   ?  ? PT LONG TERM GOAL #3  ? Title Patient will complete five times sit to stand test in <10 seconds indicating an increased LE strength and improved balance.   ? Baseline 02/16/22: 39 seconds   ? Time 8   ? Period Weeks   ? Status New   ? Target Date 04/13/22   ?  ? PT LONG TERM GOAL #4  ? Title Patient will increase 10 meter walk test to >1.9ms as to improve gait speed for better community ambulation and to reduce fall risk.   ? Baseline 02/16/22: self-selected 0.442m, fast 0.818m  ? Time 8   ? Period Weeks   ? Status New   ? Target Date 04/13/22   ?  ? PT LONG TERM GOAL #5  ? Title Patient will increase six minute walk test distance to >[id number] demonstrate improved standing/walking  endurance for full-duty at work and to be within the norms of this test for her age group.   ? Baseline 02/24/22: [id number] Time 8   ? Period Weeks   ? Status New   ? Target Date 04/13/22   ? ?  ?  ? ?  ? ? ? ? ?

## 2022-02-27 DIAGNOSIS — R0902 Hypoxemia: Secondary | ICD-10-CM

## 2022-02-27 DIAGNOSIS — Z789 Other specified health status: Secondary | ICD-10-CM | POA: Insufficient documentation

## 2022-02-27 DIAGNOSIS — M109 Gout, unspecified: Secondary | ICD-10-CM | POA: Insufficient documentation

## 2022-02-27 DIAGNOSIS — R55 Syncope and collapse: Secondary | ICD-10-CM | POA: Insufficient documentation

## 2022-02-27 DIAGNOSIS — Z720 Tobacco use: Secondary | ICD-10-CM | POA: Insufficient documentation

## 2022-02-27 DIAGNOSIS — I10 Essential (primary) hypertension: Secondary | ICD-10-CM | POA: Insufficient documentation

## 2022-02-27 HISTORY — DX: Hypoxemia: R09.02

## 2022-03-02 ENCOUNTER — Ambulatory Visit: Payer: 59 | Attending: Hospice and Palliative Medicine | Admitting: Physical Therapy

## 2022-03-02 ENCOUNTER — Encounter: Payer: Self-pay | Admitting: Physical Therapy

## 2022-03-02 DIAGNOSIS — R2681 Unsteadiness on feet: Secondary | ICD-10-CM | POA: Diagnosis present

## 2022-03-02 DIAGNOSIS — M6281 Muscle weakness (generalized): Secondary | ICD-10-CM

## 2022-03-02 NOTE — Therapy (Signed)
[place] [facility] ?[address] ?[place], [place], [address] ?Phone: [phone]   Fax:  [phone] ? ?Physical Therapy Treatment ? ?Patient Details  ?Name: Michelle Walker ?MRN: [id number] ?Date of Birth: 17-Apr-1961 ?No data recorded ? ?Encounter Date: 03/02/2022 ? ? PT End of Session - 03/02/22 0848   ? ? Visit Number 4   ? Number of Visits 17   ? Date for PT Re-Evaluation 04/13/22   ? Progress Note Due on Visit 10   ? PT Start Time [phone]   ? PT Stop Time 0830   ? PT Time Calculation (min) 44 min   ? Equipment Utilized During Treatment Gait belt   ? Activity Tolerance Patient tolerated treatment well;Patient limited by pain   ? Behavior During Therapy [facility] for tasks assessed/performed   ? ?  ?  ? ?  ? ? ?Past Medical History:  ?Diagnosis Date  ? Anemia   ? Arthritis   ? Breast cancer ([place]) 08/2015  ? 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 Wide excision/ RT, T1a,N0. ER/PR pos, Her 2.neg. Tamoxifen x 6 months, d/c secondary to vasomotor sympotms.   ? Headache   ? Hypertension   ? Peripheral neuropathy   ? Personal history of radiation therapy 2016  ? RIGHT lumpectomy  ? Thyroid disease   ? ? ?Past Surgical History:  ?Procedure Laterality Date  ? ANTERIOR CRUCIATE LIGAMENT REPAIR Right   ? BREAST BIOPSY Right 07/17/2015  ? INVASIVE LOBULAR CARCINOMA, CLASSIC TYPE (1.5 MM), ARISING IN A   ? BREAST BIOPSY Left 09/19/2020  ? [place] bx 3 areas/ 1oc q clip/ 12 oc vision clip, axilla lymph node hydromark shape 3/ path pending  ? BREAST LUMPECTOMY Right 08/22/2015  ? BREAST LUMPECTOMY WITH SENTINEL LYMPH NODE BIOPSY Right 08/22/2015  ? Procedure: BREAST LUMPECTOMY WITH SENTINEL LYMPH NODE BX;  Surgeon: Christene Lye, MD;  Location: [facility];  Service: General;  Laterality: Right;  ? COLONOSCOPY W/ POLYPECTOMY  2014  ? COLONOSCOPY WITH PROPOFOL N/A 04/16/2019  ? Procedure: COLONOSCOPY WITH BIOPSIES;  Surgeon: Lucilla Lame, MD;  Location: [place];  Service: Endoscopy;  Laterality: N/A;  ? DILATION AND CURETTAGE OF UTERUS    ? 20 years ago  ? MASTECTOMY Left   ? 2021  ? NOVASURE ABLATION    ? POLYPECTOMY N/A 04/16/2019  ? Procedure: POLYPECTOMY;  Surgeon: Lucilla Lame, MD;  Location: [place];  Service: Endoscopy;  Laterality: N/A;  ? PORTACATH PLACEMENT Right 12/01/2020  ? Procedure: INSERTION PORT-A-CATH;  Surgeon: [name], MD;  Location: [facility];  Service: General;  Laterality: Right;  ? TOTAL MASTECTOMY Left 05/06/2021  ? Procedure: modified radical mastectomy;  Surgeon: [name], MD;  Location: [facility];  Service: General;  Laterality: Left;  Provider requesting 1.5 hours / 90 minutes for procedure  ? ? ?There were no vitals filed for this visit. ? ? Subjective Assessment - 03/02/22 0750   ? ? Subjective Pt states she is well today. She spent the weekend in the hospital in [place] after falling off of a barstool in the early morning due to passing out and hitting her head - doctors do not suspect head injury. EMS discovered significant hypotension. While in the hospital they also found low oxygen levels. States she has not been back to work yet, she decided to take a couple days off. She reports 6/10 pain in RLE.   ? Pertinent History Patient arrives  with report of chemo-induced peripheral neuropathy affecting BLE, right worse than left. Pt was diagnosed with breast cancer last year and treated using chemotherapy starting in 12/2020. She states that it initially effected her hands and feet however has resolved in the hands. Her nails also turned dark in color - fingernails have grown out to normal color however toenails remain dark (associated with chemo and sensation changes). Patient endorses 1 fall over the past 6 months that occurred while walking through her home; she states she lost her balance and is now very careful. Now she looks down while walking due to feeling of imbalance. She does not use a cane or AD.  She states turning her head while walking also seems to affect her balance. When she walks or stands for > 1 hour, she feels a pull in her R groin. 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 She is unsure how long her job will allow her to maintain this position.   ? Limitations Standing;House hold activities;Walking;Lifting   ? How long can you sit comfortably? unlimited   ? How long can you stand comfortably? <1 hour   ? How long can you walk comfortably? <1 hour   ? Patient Stated Goals to get 100% use and strength back into the legs   ? Currently in Pain? Yes   ? Pain Score 6    ? Pain Location Foot   ? Pain Orientation Right   ? ?  ?  ? ?  ? ? ? ? ? ?INTERVENTIONS ?  ?-NuStep warm-up at level 2 for 5 minutes  ?            -SpO2 94%, HR 126 ?  ?- PT performed desensitization to bilateral feet, gradually increasing discomfort; pillowcase > washcloth > tongue depressor, progressed from light pressure to deep. Total time: 10 minutes.  ?  ?STS from EOM 23 inches with no UE support, 2x10 ?            -VC and demo on technique  ? ?Step-ups to first step, 2x10 each side  ? -BUE support, more challenging on right with increased use of arms. ?  ?Static standing on airex pad, x60 seconds  ?            -improved from last session ? ?Standing on Airex pad with trunk rotation pass to PT using 2kg ball x10 each direction ? -increased reliance and weightshift to LLE ? ?Standing with LLE on 3in step, RLE on ground with trunk rotation pass to PT using 2kg ball, x10 in each direction ? -pt reports increased work of RLE  ?  ?  ?HEP Access Code: [email or web address] (no updates) ?  ?  ?  ?  ?Clinical Impression: Patient is pleasant and motivated throughout session. Oxygen levels were monitored due to recent hospital admission with discovery of decreased SpO2 - she remains 93% or greater on RA. Desensitization was progressed  with increased texture and rough material. She demonstrates weakness in the RLE with weight shift to left during STS and static standing and she requires increased use of arms to pull herself up during step ups leading with RLE. Patient will benefit from continued PT to address strength and balance deficits and decrease risk of future falls in order to play with her grand kids safely and return to full-duty at work.  ? ? ? ? ? ? ? ? ? ?  PT Short Term Goals - 02/16/22 1648   ? ?  ? PT SHORT TERM GOAL #1  ? Title Pt will be independent with HEP in order to improve strength and balance in order to decrease fall risk and improve function at home and work.   ? Time 4   ? Period Weeks   ? Status New   ? Target Date 03/16/22   ? ?  ?  ? ?  ? ? ? ? PT Long Term Goals - 02/26/22 1106   ? ?  ? PT LONG TERM GOAL #1  ? Title Patient will increase FOTO score to equal to or greater than 61 to demonstrate statistically significant improvement in mobility and quality of life.   ? Baseline 02/16/22: 51   ? Time 8   ? Period Weeks   ? Status New   ? Target Date 04/13/22   ?  ? PT LONG TERM GOAL #2  ? Title Pt will improve FGA score to at least 22/30 to demonstrate improvement in balance and decrease in fall risk at home and in community.   ? Baseline 02/16/22: 12/30   ? Time 8   ? Period Weeks   ? Status New   ? Target Date 04/13/22   ?  ? PT LONG TERM GOAL #3  ? Title Patient will complete five times sit to stand test in <10 seconds indicating an increased LE strength and improved balance.   ? Baseline 02/16/22: 39 seconds   ? Time 8   ? Period Weeks   ? Status New   ? Target Date 04/13/22   ?  ? PT LONG TERM GOAL #4  ? Title Patient will increase 10 meter walk test to >1.74ms as to improve gait speed for better community ambulation and to reduce fall risk.   ? Baseline 02/16/22: self-selected 0.456m, fast 0.8143m  ? Time 8   ? Period Weeks   ? Status New   ? Target Date 04/13/22   ?  ? PT LONG TERM GOAL #5  ? Title Patient will  increase six minute walk test distance to >[id number] demonstrate improved standing/walking endurance for full-duty at work and to be within the norms of this test for her age group.   ? Baseline 02/24/22: 10

## 2022-03-03 ENCOUNTER — Encounter: Payer: 59 | Admitting: Obstetrics and Gynecology

## 2022-03-03 DIAGNOSIS — Z01818 Encounter for other preprocedural examination: Secondary | ICD-10-CM

## 2022-03-04 ENCOUNTER — Ambulatory Visit: Payer: 59 | Admitting: Physical Therapy

## 2022-03-04 ENCOUNTER — Encounter: Payer: Self-pay | Admitting: Physical Therapy

## 2022-03-04 DIAGNOSIS — M6281 Muscle weakness (generalized): Secondary | ICD-10-CM

## 2022-03-04 DIAGNOSIS — R2681 Unsteadiness on feet: Secondary | ICD-10-CM

## 2022-03-04 NOTE — Therapy (Signed)
[place] [facility] ?[address] ?[place], [place], [address] ?Phone: [phone]   Fax:  [phone] ? ?Physical Therapy Treatment ? ?Patient Details  ?Name: Michelle Walker ?MRN: [id number] ?Date of Birth: [birth year] ?No data recorded ? ?Encounter Date: 03/04/2022 ? ? PT End of Session - 03/04/22 0753   ? ? Visit Number 5   ? Number of Visits 17   ? Date for PT Re-Evaluation 04/13/22   ? Progress Note Due on Visit 10   ? PT Start Time [phone]   ? PT Stop Time 0830   ? PT Time Calculation (min) 44 min   ? Equipment Utilized During Treatment Gait belt   ? Activity Tolerance Patient tolerated treatment well;Patient limited by pain   ? Behavior During Therapy [facility] for tasks assessed/performed   ? ?  ?  ? ?  ? ? ?Past Medical History:  ?Diagnosis Date  ? Anemia   ? Arthritis   ? Breast cancer ([place]) 08/2015  ? 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 Wide excision/ RT, T1a,N0. ER/PR pos, Her 2.neg. Tamoxifen x 6 months, d/c secondary to vasomotor sympotms.   ? Headache   ? Hypertension   ? Peripheral neuropathy   ? Personal history of radiation therapy 2016  ? RIGHT lumpectomy  ? Thyroid disease   ? ? ?Past Surgical History:  ?Procedure Laterality Date  ? ANTERIOR CRUCIATE LIGAMENT REPAIR Right   ? BREAST BIOPSY Right 07/17/2015  ? INVASIVE LOBULAR CARCINOMA, CLASSIC TYPE (1.5 MM), ARISING IN A   ? BREAST BIOPSY Left 09/19/2020  ? [place] bx 3 areas/ 1oc q clip/ 12 oc vision clip, axilla lymph node hydromark shape 3/ path pending  ? BREAST LUMPECTOMY Right 08/22/2015  ? BREAST LUMPECTOMY WITH SENTINEL LYMPH NODE BIOPSY Right 08/22/2015  ? Procedure: BREAST LUMPECTOMY WITH SENTINEL LYMPH NODE BX;  Surgeon: Christene Lye, MD;  Location: [facility];  Service: General;  Laterality: Right;  ? COLONOSCOPY W/ POLYPECTOMY  2014  ? COLONOSCOPY WITH PROPOFOL N/A 04/16/2019  ? Procedure: COLONOSCOPY WITH BIOPSIES;  Surgeon: Lucilla Lame, MD;  Location: [place];  Service: Endoscopy;  Laterality: N/A;  ? DILATION AND CURETTAGE OF UTERUS    ? 20 years ago  ? MASTECTOMY Left   ? 2021  ? NOVASURE ABLATION    ? POLYPECTOMY N/A 04/16/2019  ? Procedure: POLYPECTOMY;  Surgeon: Lucilla Lame, MD;  Location: [place];  Service: Endoscopy;  Laterality: N/A;  ? PORTACATH PLACEMENT Right 12/01/2020  ? Procedure: INSERTION PORT-A-CATH;  Surgeon: [name], MD;  Location: [facility];  Service: General;  Laterality: Right;  ? TOTAL MASTECTOMY Left 05/06/2021  ? Procedure: modified radical mastectomy;  Surgeon: [name], MD;  Location: [facility];  Service: General;  Laterality: Left;  Provider requesting 1.5 hours / 90 minutes for procedure  ? ? ?There were no vitals filed for this visit. ? ? Subjective Assessment - 03/04/22 0749   ? ? Subjective Pt states she has been back to work for 2 nights. Her pain has increased to a 9/10 in RLE - she thinks from work and wearing steel-toed shoes. She is having cramping in her foot this morning and it has been "jumping" all night.   ? Pertinent History Patient arrives with report of chemo-induced peripheral neuropathy affecting BLE, right worse than left. Pt was diagnosed with breast cancer last year and treated using chemotherapy starting in 12/2020. She states that it initially  effected her hands and feet however has resolved in the hands. Her nails also turned dark in color - fingernails have grown out to normal color however toenails remain dark (associated with chemo and sensation changes). Patient endorses 1 fall over the past 6 months that occurred while walking through her home; she states she lost her balance and is now very careful. Now she looks down while walking due to feeling of imbalance. She does not use a cane or AD. She states turning her head while walking also seems to affect her balance. When she walks or stands for > 1 hour, she feels a pull in her R groin. She is currently working the night shift (her  usual shift) however is unable to return full-duty due to lifting restrictions and imbalance while standing/walking. She is unsure how long her job will allow her to maintain this position.   ? Limitations Standing;House hold activities;Walking;Lifting   ? How long can you sit comfortably? unlimited   ? How long can you stand comfortably? <1 hour   ? How long can you walk comfortably? <1 hour   ? Patient Stated Goals to get 100% use and strength back into the legs   ? Currently in Pain? Yes   ? Pain Score 9    ? Pain Location Foot   ? Pain Orientation Right   ? ?  ?  ? ?  ? ? ? ? ? ? ?INTERVENTIONS ?  ?-NuStep warm-up at level 3 >>2  for 5 minutes  ?  ?- PT performed desensitization to bilateral feet (increased time spent on right), gradually increasing discomfort; tissue > washcloth > textured ball > spiky hedgehog > deep pressure on lacrosse ball. Total time: 15 minutes.  ?  ?Long sitting DF using YellowTB 3x10 BLE ? ?STS from EOM 23 inches with no UE support, 2x10 ?  ?Seated toe raises for DF 2x10 ? ?Standing heel raises w/ UE support 2x10 ? ?Calf stretch on 1st step, 3x30 seconds each  ? ? ?HEP Access Code: [email or web address]  ? -added DF using YellowTB ? ? ? ? ?Previous Exercises: ?--Step-ups to first step, 2x10 each side  ?            -BUE support, more challenging on right with increased use of arms. ?--Static standing on airex pad, x60 seconds  ?--Standing on Airex pad with trunk rotation pass to PT using 2kg ball x10 each direction ?            -increased reliance and weightshift to LLE ? --Standing with LLE on 3in step, RLE on ground with trunk rotation pass to PT using 2kg ball, x10 in each direction ?            -pt reports increased work of RLE  ?  ?  ? ?  ?  ?  ?Clinical Impression: Patient is pleasant and motivated throughout session. RLE was more sensitive to desensitization techniques this date compared to previous - increased time spent in this area. Right ankle remains profoundly weaker than left. VC to  ensure equal WB/participation of BLE during exercises performed with simultaneous LE. STS form and performance have greatly improved - pt is ready to slowly progress to lower surfaces. Patient will benefit from continued PT to address strength and balance deficits and decrease risk of future falls in order to play with her grand kids safely and return to full-duty at work.  ? ? ? ? ? ? ? PT Short Term Goals -  02/16/22 1648   ? ?  ? PT SHORT TERM GOAL #1  ? Title Pt will be independent with HEP in order to improve strength and balance in order to decrease fall risk and improve function at home and work.   ? Time 4   ? Period Weeks   ? Status New   ? Target Date 03/16/22   ? ?  ?  ? ?  ? ? ? ? PT Long Term Goals - 02/26/22 1106   ? ?  ? PT LONG TERM GOAL #1  ? Title Patient will increase FOTO score to equal to or greater than 61 to demonstrate statistically significant improvement in mobility and quality of life.   ? Baseline 02/16/22: 51   ? Time 8   ? Period Weeks   ? Status New   ? Target Date 04/13/22   ?  ? PT LONG TERM GOAL #2  ? Title Pt will improve FGA score to at least 22/30 to demonstrate improvement in balance and decrease in fall risk at home and in community.   ? Baseline 02/16/22: 12/30   ? Time 8   ? Period Weeks   ? Status New   ? Target Date 04/13/22   ?  ? PT LONG TERM GOAL #3  ? Title Patient will complete five times sit to stand test in <10 seconds indicating an increased LE strength and improved balance.   ? Baseline 02/16/22: 39 seconds   ? Time 8   ? Period Weeks   ? Status New   ? Target Date 04/13/22   ?  ? PT LONG TERM GOAL #4  ? Title Patient will increase 10 meter walk test to >1.69ms as to improve gait speed for better community ambulation and to reduce fall risk.   ? Baseline 02/16/22: self-selected 0.481m, fast 0.8137m  ? Time 8   ? Period Weeks   ? Status New   ? Target Date 04/13/22   ?  ? PT LONG TERM GOAL #5  ? Title Patient will increase six minute walk test distance to >[id number]  demonstrate improved standing/walking endurance for full-duty at work and to be within the norms of this test for her age group.   ? Baseline 02/24/22: [id number] Time 8   ? Period Weeks   ? Status New   ?

## 2022-03-09 ENCOUNTER — Ambulatory Visit: Payer: 59 | Admitting: Physical Therapy

## 2022-03-11 ENCOUNTER — Encounter: Payer: 59 | Admitting: Physical Therapy

## 2022-03-12 ENCOUNTER — Ambulatory Visit: Payer: 59 | Admitting: Physical Therapy

## 2022-03-12 ENCOUNTER — Encounter: Payer: Self-pay | Admitting: Physical Therapy

## 2022-03-12 DIAGNOSIS — R2681 Unsteadiness on feet: Secondary | ICD-10-CM | POA: Diagnosis not present

## 2022-03-12 DIAGNOSIS — M6281 Muscle weakness (generalized): Secondary | ICD-10-CM

## 2022-03-12 NOTE — Therapy (Signed)
[facility] ?[address] ?[place], [place], [address] ?Phone: [phone]   Fax:  [phone] ? ?Physical Therapy Treatment ? ?Patient Details  ?Name: Michelle Walker ?MRN: [id number] ?Date of Birth: 10-Dec-1960 ?No data recorded ? ?Encounter Date: 03/12/2022 ? ? PT End of Session - 03/12/22 0840   ? ? Visit Number 6   ? Number of Visits 17   ? Date for PT Re-Evaluation 04/13/22   ? Authorization - Visit Number 6   ? Progress Note Due on Visit 10   ? PT Start Time [phone]   ? PT Stop Time 4401   ? PT Time Calculation (min) 38 min   ? Activity Tolerance Patient tolerated treatment well;Patient limited by pain   ? Behavior During Therapy [facility] for tasks assessed/performed   ? ?  ?  ? ?  ? ? ?Past Medical History:  ?Diagnosis Date  ? Anemia   ? Arthritis   ? Breast cancer ([place]) 08/2015  ? 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 Wide excision/ RT, T1a,N0. ER/PR pos, Her 2.neg. Tamoxifen x 6 months, d/c secondary to vasomotor sympotms.   ? Headache   ? Hypertension   ? Peripheral neuropathy   ? Personal history of radiation therapy 2016  ? RIGHT lumpectomy  ? Thyroid disease   ? ? ?Past Surgical History:  ?Procedure Laterality Date  ? ANTERIOR CRUCIATE LIGAMENT REPAIR Right   ? BREAST BIOPSY Right 07/17/2015  ? INVASIVE LOBULAR CARCINOMA, CLASSIC TYPE (1.5 MM), ARISING IN A   ? BREAST BIOPSY Left 09/19/2020  ? [place] bx 3 areas/ 1oc q clip/ 12 oc vision clip, axilla lymph node hydromark shape 3/ path pending  ? BREAST LUMPECTOMY Right 08/22/2015  ? BREAST LUMPECTOMY WITH SENTINEL LYMPH NODE BIOPSY Right 08/22/2015  ? Procedure: BREAST LUMPECTOMY WITH SENTINEL LYMPH NODE BX;  Surgeon: Christene Lye, MD;  Location: [facility];  Service: General;  Laterality: Right;  ? COLONOSCOPY W/ POLYPECTOMY  2014  ? COLONOSCOPY WITH PROPOFOL N/A 04/16/2019  ? Procedure: COLONOSCOPY WITH BIOPSIES;  Surgeon: Lucilla Lame, MD;  Location: [place];   Service: Endoscopy;  Laterality: N/A;  ? DILATION AND CURETTAGE OF UTERUS    ? 20 years ago  ? MASTECTOMY Left   ? 2021  ? NOVASURE ABLATION    ? POLYPECTOMY N/A 04/16/2019  ? Procedure: POLYPECTOMY;  Surgeon: Lucilla Lame, MD;  Location: [place];  Service: Endoscopy;  Laterality: N/A;  ? PORTACATH PLACEMENT Right 12/01/2020  ? Procedure: INSERTION PORT-A-CATH;  Surgeon: [name], MD;  Location: [facility];  Service: General;  Laterality: Right;  ? TOTAL MASTECTOMY Left 05/06/2021  ? Procedure: modified radical mastectomy;  Surgeon: [name], MD;  Location: [facility];  Service: General;  Laterality: Left;  Provider requesting 1.5 hours / 90 minutes for procedure  ? ? ?There were no vitals filed for this visit. ? ? Subjective Assessment - 03/12/22 0838   ? ? Subjective Patient reports she is completing desensitization exercises at home and they are more tickly than painful. Reports 6/10 in R foot and leg   ? Pertinent History Patient arrives with report of chemo-induced peripheral neuropathy affecting BLE, right worse than left. Pt was diagnosed with breast cancer last year and treated using chemotherapy starting in 12/2020. She states that it initially effected her hands and feet however has resolved in the hands. Her nails also turned dark in color - fingernails have grown out  to normal color however toenails remain dark (associated with chemo and sensation changes). Patient endorses 1 fall over the past 6 months that occurred while walking through her home; she states she lost her balance and is now very careful. Now she looks down while walking due to feeling of imbalance. She does not use a cane or AD. She states turning her head while walking also seems to affect her balance. When she walks or stands for > 1 hour, she feels a pull in her R groin. 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 She is unsure how long her job will allow her to maintain this position.   ? Limitations Standing;House hold activities;Walking;Lifting   ? How long can you sit comfortably? unlimited   ? How long can you stand comfortably? <1 hour   ? How long can you walk comfortably? <1 hour   ? Patient Stated Goals to get 100% use and strength back into the legs   ? ?  ?  ? ?  ? ? ?INTERVENTIONS ?  ?-NuStep warm-up at level 3 >>2  for 5 minutes  ?  ?Seated toe raises for DF 2x12 with min cuing for increased R height ? ?Duck walks 2x 91f with HHA modA with difficulty maintaining DF on R side ? ?STS from EOM 23 inches with no UE support 2x 10 with min cuing to be able to slip a piece of paper under the toes ?With LLE on foam pad x6 for increased RLE demand with success, increased fatigue ?  ?- PT performed desensitization to bilateral feet (increased time spent on right), gradually increasing discomfort; tissue > washcloth > textured ball > spiky hedgehog > deep pressure on lacrosse ball. Total time: 15 minutes.  ? ?Tandem walk [id number]; 1 instance of minA needed ?  ?Calf stretch on 1st step, 3x30 seconds each  ?  ?  ? ? ? ? ? ? ? ? PT Education - 03/12/22 0840   ? ? Education Details therex form/technique   ? Person(s) Educated Patient   ? Methods Explanation;Demonstration;Verbal cues   ? Comprehension Verbalized understanding;Returned demonstration;Verbal cues required   ? ?  ?  ? ?  ? ? ? PT Short Term Goals - 02/16/22 1648   ? ?  ? PT SHORT TERM GOAL #1  ? Title Pt will be independent with HEP in order to improve strength and balance in order to decrease fall risk and improve function at home and work.   ? Time 4   ? Period Weeks   ? Status New   ? Target Date 03/16/22   ? ?  ?  ? ?  ? ? ? ? PT Long Term Goals - 02/26/22 1106   ? ?  ? PT LONG TERM GOAL #1  ? Title Patient will increase FOTO score to equal to or greater than 61 to demonstrate statistically significant improvement in mobility and  quality of life.   ? Baseline 02/16/22: 51   ? Time 8   ? Period Weeks   ? Status New   ? Target Date 04/13/22   ?  ? PT LONG TERM GOAL #2  ? Title Pt will improve FGA score to at least 22/30 to demonstrate improvement in balance and decrease in fall risk at home and in community.   ? Baseline 02/16/22: 12/30   ? Time 8   ?  Period Weeks   ? Status New   ? Target Date 04/13/22   ?  ? PT LONG TERM GOAL #3  ? Title Patient will complete five times sit to stand test in <10 seconds indicating an increased LE strength and improved balance.   ? Baseline 02/16/22: 39 seconds   ? Time 8   ? Period Weeks   ? Status New   ? Target Date 04/13/22   ?  ? PT LONG TERM GOAL #4  ? Title Patient will increase 10 meter walk test to >1.33ms as to improve gait speed for better community ambulation and to reduce fall risk.   ? Baseline 02/16/22: self-selected 0.478m, fast 0.8171m  ? Time 8   ? Period Weeks   ? Status New   ? Target Date 04/13/22   ?  ? PT LONG TERM GOAL #5  ? Title Patient will increase six minute walk test distance to >[id number] demonstrate improved standing/walking endurance for full-duty at work and to be within the norms of this test for her age group.   ? Baseline 02/24/22: 1036ft65f Time 8   ? Period Weeks   ? Status New   ? Target Date 04/13/22   ? ?  ?  ? ?  ? ? ? ? ? ? ? ? Plan - 03/12/22 0912   ? ? Clinical Impression Statement PT ocntinued therex progression for increased dynamic balance and active DF focus for foot clearance needed to prevent falls with success. Patient is motivated throughout session and able to demonstrate good carry over of therex with cuing. Gaurding needed for safety in dynamic balance exercises. PT will continue progression as able.   ? Personal Factors and Comorbidities Time since onset of injury/illness/exacerbation;Behavior Pattern;Past/Current Experience;Comorbidity 3+   ? Comorbidities DM2, cancer, HTN   ? Examination-Activity Limitations Bend;Carry;Lift;Stairs;Squat;Stand;Transfers    ? Examination-Participation Restrictions Community Activity;Driving;Occupation;Yard Work;Cleaning   ? Stability/Clinical Decision Making Evolving/Moderate complexity   ? Clinical Decision Making Moderate   ? Rehab P

## 2022-03-16 ENCOUNTER — Encounter: Payer: Self-pay | Admitting: Physical Therapy

## 2022-03-16 ENCOUNTER — Ambulatory Visit: Payer: 59 | Admitting: Physical Therapy

## 2022-03-16 DIAGNOSIS — M6281 Muscle weakness (generalized): Secondary | ICD-10-CM

## 2022-03-16 DIAGNOSIS — R2681 Unsteadiness on feet: Secondary | ICD-10-CM | POA: Diagnosis not present

## 2022-03-16 NOTE — Therapy (Signed)
[facility] [facility] ?[address] ?[place], [place], [address] ?Phone: [phone]   Fax:  [phone] ? ?Physical Therapy Treatment ? ?Patient Details  ?Name: Michelle Walker ?MRN: [id number] ?Date of Birth: 1961/01/31 ?No data recorded ? ?Encounter Date: 03/16/2022 ? ? PT End of Session - 03/16/22 0836   ? ? Visit Number 7   ? Number of Visits 17   ? Date for PT Re-Evaluation 04/13/22   ? Authorization - Visit Number 7   ? Progress Note Due on Visit 10   ? PT Start Time 0831   ? PT Stop Time 0910   ? PT Time Calculation (min) 39 min   ? Activity Tolerance Patient tolerated treatment well   ? Behavior During Therapy [facility] for tasks assessed/performed   ? ?  ?  ? ?  ? ? ?Past Medical History:  ?Diagnosis Date  ? Anemia   ? Arthritis   ? Breast cancer ([place]) 08/2015  ? 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 Wide excision/ RT, T1a,N0. ER/PR pos, Her 2.neg. Tamoxifen x 6 months, d/c secondary to vasomotor sympotms.   ? Headache   ? Hypertension   ? Peripheral neuropathy   ? Personal history of radiation therapy 2016  ? RIGHT lumpectomy  ? Thyroid disease   ? ? ?Past Surgical History:  ?Procedure Laterality Date  ? ANTERIOR CRUCIATE LIGAMENT REPAIR Right   ? BREAST BIOPSY Right 07/17/2015  ? INVASIVE LOBULAR CARCINOMA, CLASSIC TYPE (1.5 MM), ARISING IN A   ? BREAST BIOPSY Left 09/19/2020  ? [place] bx 3 areas/ 1oc q clip/ 12 oc vision clip, axilla lymph node hydromark shape 3/ path pending  ? BREAST LUMPECTOMY Right 08/22/2015  ? BREAST LUMPECTOMY WITH SENTINEL LYMPH NODE BIOPSY Right 08/22/2015  ? Procedure: BREAST LUMPECTOMY WITH SENTINEL LYMPH NODE BX;  Surgeon: Christene Lye, MD;  Location: [facility];  Service: General;  Laterality: Right;  ? COLONOSCOPY W/ POLYPECTOMY  2014  ? COLONOSCOPY WITH PROPOFOL N/A 04/16/2019  ? Procedure: COLONOSCOPY WITH BIOPSIES;  Surgeon: Lucilla Lame, MD;  Location: [place];  Service: Endoscopy;   Laterality: N/A;  ? DILATION AND CURETTAGE OF UTERUS    ? 20 years ago  ? MASTECTOMY Left   ? 2021  ? NOVASURE ABLATION    ? POLYPECTOMY N/A 04/16/2019  ? Procedure: POLYPECTOMY;  Surgeon: Lucilla Lame, MD;  Location: [place];  Service: Endoscopy;  Laterality: N/A;  ? PORTACATH PLACEMENT Right 12/01/2020  ? Procedure: INSERTION PORT-A-CATH;  Surgeon: [name], MD;  Location: [facility];  Service: General;  Laterality: Right;  ? TOTAL MASTECTOMY Left 05/06/2021  ? Procedure: modified radical mastectomy;  Surgeon: [name], MD;  Location: [facility];  Service: General;  Laterality: Left;  Provider requesting 1.5 hours / 90 minutes for procedure  ? ? ?There were no vitals filed for this visit. ? ? Subjective Assessment - 03/16/22 0834   ? ? Subjective Patient reports Saturday she fell, tripped on a rug on her patio and fell on her R side face first. Denies LOC, did not follow up with MD. Reports soreness on the entire R side of her body.   ? Pertinent History Patient arrives with report of chemo-induced peripheral neuropathy affecting BLE, right worse than left. Pt was diagnosed with breast cancer last year and treated using chemotherapy starting in 12/2020. She states that it initially effected her hands and feet however has resolved in the hands.  Her nails also turned dark in color - fingernails have grown out to normal color however toenails remain dark (associated with chemo and sensation changes). Patient endorses 1 fall over the past 6 months that occurred while walking through her home; she states she lost her balance and is now very careful. Now she looks down while walking due to feeling of imbalance. She does not use a cane or AD. She states turning her head while walking also seems to affect her balance. When she walks or stands for > 1 hour, she feels a pull in her R groin. 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 She is unsure how long her job will allow her to maintain this position.   ? Limitations Standing;House hold activities;Walking;Lifting   ? How long can you sit comfortably? unlimited   ? How long can you stand comfortably? <1 hour   ? How long can you walk comfortably? <1 hour   ? Patient Stated Goals to get 100% use and strength back into the legs   ? ?  ?  ? ?  ? ? ? ? ?INTERVENTIONS ?  ?-NuStep warm-up at level 3 seat 6 UE 10 with straps tighted for increased DF demand ?  ?Seated toe raises for DF 2x12 with min cuing for increased R height good carry over ? ?R hip flex with 5# football weight at toes for increased DF as well 3x 5 with encouragement ? ?Alt heel taps to 6in cone finger support 2x 12 with cuing for heel "straight up" to the cone without circumduction compensation ?  ?224f with 12 6in hurdle (6 each 1052f negotiations with cuing for heel contact after hurdle clearance with decent carry over, patient struggles to wt shift onto R foot for L swing with R knee hyperext during stance phase; able to correct this 50%  ? ?SLS with 2 finger support 30sec (added to HEP) ?  ?  ? ? ? ? ? ? ? ? ? ? ? ? ? ? ? ? ? ? ? ? ? ? ? ? PT Education - 03/16/22 0836   ? ? Education Details therex form/technique   ? Person(s) Educated Patient   ? Methods Explanation;Demonstration;Verbal cues   ? Comprehension Verbalized understanding;Returned demonstration;Verbal cues required   ? ?  ?  ? ?  ? ? ? PT Short Term Goals - 02/16/22 1648   ? ?  ? PT SHORT TERM GOAL #1  ? Title Pt will be independent with HEP in order to improve strength and balance in order to decrease fall risk and improve function at home and work.   ? Time 4   ? Period Weeks   ? Status New   ? Target Date 03/16/22   ? ?  ?  ? ?  ? ? ? ? PT Long Term Goals - 02/26/22 1106   ? ?  ? PT LONG TERM GOAL #1  ? Title Patient will increase FOTO score to equal to or greater than 61 to demonstrate statistically  significant improvement in mobility and quality of life.   ? Baseline 02/16/22: 51   ? Time 8   ? Period Weeks   ? Status New   ? Target Date 04/13/22   ?  ? PT LONG TERM GOAL #2  ? Title Pt will improve FGA score to at least 22/30 to demonstrate improvement in  balance and decrease in fall risk at home and in community.   ? Baseline 02/16/22: 12/30   ? Time 8   ? Period Weeks   ? Status New   ? Target Date 04/13/22   ?  ? PT LONG TERM GOAL #3  ? Title Patient will complete five times sit to stand test in <10 seconds indicating an increased LE strength and improved balance.   ? Baseline 02/16/22: 39 seconds   ? Time 8   ? Period Weeks   ? Status New   ? Target Date 04/13/22   ?  ? PT LONG TERM GOAL #4  ? Title Patient will increase 10 meter walk test to >1.47ms as to improve gait speed for better community ambulation and to reduce fall risk.   ? Baseline 02/16/22: self-selected 0.449m, fast 0.8120m  ? Time 8   ? Period Weeks   ? Status New   ? Target Date 04/13/22   ?  ? PT LONG TERM GOAL #5  ? Title Patient will increase six minute walk test distance to >1765f57f demonstrate improved standing/walking endurance for full-duty at work and to be within the norms of this test for her age group.   ? Baseline 02/24/22: 1036ft68f Time 8   ? Period Weeks   ? Status New   ? Target Date 04/13/22   ? ?  ?  ? ?  ? ? ? ? ? ? ? ? Plan - 03/16/22 0843   ? ? Clinical Impression Statement PT continued therex progression for increased active DF and RLE sequencing with carry over into gait to reduce fall risk. Pt is motivated througout session with good carry over of cuing for proper technique. Paitent struggles with heel contact on RLE and wt shift onto R foot for L swing with R knee hyperext during stance phase; able to correct this 50%. This sequencing probably is cause of fall over the weekend. Education on foot clearance and DF for heel strike to prevent tripping on low obstacles with understanding.PT will continue progression as  able.   ? Personal Factors and Comorbidities Time since onset of injury/illness/exacerbation;Behavior Pattern;Past/Current Experience;Comorbidity 3+   ? Comorbidities DM2, cancer, HTN   ? Examination-Activity Limitatio

## 2022-03-17 ENCOUNTER — Inpatient Hospital Stay (HOSPITAL_BASED_OUTPATIENT_CLINIC_OR_DEPARTMENT_OTHER): Payer: 59 | Admitting: Oncology

## 2022-03-17 ENCOUNTER — Inpatient Hospital Stay: Payer: 59 | Attending: Oncology

## 2022-03-17 ENCOUNTER — Encounter: Payer: Self-pay | Admitting: Oncology

## 2022-03-17 ENCOUNTER — Inpatient Hospital Stay: Payer: 59

## 2022-03-17 VITALS — BP 139/92 | HR 111 | Temp 97.3°F | Resp 16 | Ht 65.0 in | Wt 183.1 lb

## 2022-03-17 DIAGNOSIS — C50912 Malignant neoplasm of unspecified site of left female breast: Secondary | ICD-10-CM | POA: Diagnosis present

## 2022-03-17 DIAGNOSIS — Z853 Personal history of malignant neoplasm of breast: Secondary | ICD-10-CM

## 2022-03-17 DIAGNOSIS — T451X5A Adverse effect of antineoplastic and immunosuppressive drugs, initial encounter: Secondary | ICD-10-CM

## 2022-03-17 DIAGNOSIS — G620 Drug-induced polyneuropathy: Secondary | ICD-10-CM

## 2022-03-17 DIAGNOSIS — Z808 Family history of malignant neoplasm of other organs or systems: Secondary | ICD-10-CM | POA: Diagnosis not present

## 2022-03-17 DIAGNOSIS — I10 Essential (primary) hypertension: Secondary | ICD-10-CM | POA: Diagnosis not present

## 2022-03-17 DIAGNOSIS — Z170 Estrogen receptor positive status [ER+]: Secondary | ICD-10-CM | POA: Insufficient documentation

## 2022-03-17 DIAGNOSIS — Z08 Encounter for follow-up examination after completed treatment for malignant neoplasm: Secondary | ICD-10-CM

## 2022-03-17 DIAGNOSIS — Z5181 Encounter for therapeutic drug level monitoring: Secondary | ICD-10-CM

## 2022-03-17 DIAGNOSIS — F17210 Nicotine dependence, cigarettes, uncomplicated: Secondary | ICD-10-CM | POA: Diagnosis not present

## 2022-03-17 DIAGNOSIS — Z7983 Long term (current) use of bisphosphonates: Secondary | ICD-10-CM

## 2022-03-17 LAB — COMPREHENSIVE METABOLIC PANEL
ALT: 12 U/L (ref 0–44)
AST: 20 U/L (ref 15–41)
Albumin: 3.7 g/dL (ref 3.5–5.0)
Alkaline Phosphatase: 76 U/L (ref 38–126)
Anion gap: 7 (ref 5–15)
BUN: 17 mg/dL (ref 6–20)
CO2: 27 mmol/L (ref 22–32)
Calcium: 9.3 mg/dL (ref 8.9–10.3)
Chloride: 98 mmol/L (ref 98–111)
Creatinine, Ser: 0.89 mg/dL (ref 0.44–1.00)
GFR, Estimated: >60 mL/min (ref >60)
Glucose, Bld: 105 mg/dL — ABNORMAL HIGH (ref 70–99)
Potassium: 3.2 mmol/L — ABNORMAL LOW (ref 3.5–5.1)
Sodium: 132 mmol/L — ABNORMAL LOW (ref 135–145)
Total Bilirubin: 0.6 mg/dL (ref 0.3–1.2)
Total Protein: 7.5 g/dL (ref 6.5–8.1)

## 2022-03-17 LAB — CBC WITH DIFFERENTIAL/PLATELET
Abs Immature Granulocytes: 0.02 10*3/uL (ref 0.00–0.07)
Basophils Absolute: 0 10*3/uL (ref 0.0–0.1)
Basophils Relative: 1 %
Eosinophils Absolute: 0 10*3/uL (ref 0.0–0.5)
Eosinophils Relative: 1 %
HCT: 40.6 % (ref 36.0–46.0)
Hemoglobin: 13.3 g/dL (ref 12.0–15.0)
Immature Granulocytes: 0 %
Lymphocytes Relative: 17 %
Lymphs Abs: 1 10*3/uL (ref 0.7–4.0)
MCH: 27.7 pg (ref 26.0–34.0)
MCHC: 32.8 g/dL (ref 30.0–36.0)
MCV: 84.6 fL (ref 80.0–100.0)
Monocytes Absolute: 0.5 10*3/uL (ref 0.1–1.0)
Monocytes Relative: 9 %
Neutro Abs: 4.3 10*3/uL (ref 1.7–7.7)
Neutrophils Relative %: 72 %
Platelets: 196 10*3/uL (ref 150–400)
RBC: 4.8 MIL/uL (ref 3.87–5.11)
RDW: 12.6 % (ref 11.5–15.5)
WBC: 5.8 10*3/uL (ref 4.0–10.5)
nRBC: 0 % (ref 0.0–0.2)

## 2022-03-17 MED ORDER — PREGABALIN 75 MG PO CAPS: 75.0000 mg | ORAL_CAPSULE | Freq: Two times a day (BID) | ORAL | 3 refills | Status: DC

## 2022-03-17 MED ORDER — POTASSIUM CHLORIDE CRYS ER 20 MEQ PO TBCR: 20.0000 meq | EXTENDED_RELEASE_TABLET | Freq: Every day | ORAL | 0 refills | Status: DC

## 2022-03-17 NOTE — Progress Notes (Signed)
Multiple IV sticks attempted with no success. Pt requesting to reschedule appointment.  Pt made aware of new rx for Potassium pills. Pt verbalized understanding. ?

## 2022-03-17 NOTE — Progress Notes (Signed)
? ? ? ?Hematology/Oncology Consult note ?[place]  ?[phone] [phone] ? ?Patient Care Team: ?Casilda Carls, MD as PCP - General (Internal Medicine) ?Casilda Carls, MD as Consulting Physician (Internal Medicine) ?Christene Lye, MD (General Surgery) ?Hyatt, Max T, DPM as Veterinary surgeon) ?Sindy Guadeloupe, MD as Consulting Physician (Hematology and Oncology) ?[name], MD as Consulting Physician (General Surgery) ?Noreene Filbert, MD as Consulting Physician (Radiation Oncology)  ? ?Name of the patient: Michelle Walker  ?[id number]  ?December 09, 1960  ? ?Date of visit: 03/17/22 ? ?Diagnosis- recurrent left breast cancer stage II [id number] ER/PR positive HER-2 negative ?  ? ?Chief complaint/ Reason for visit-routine follow-up of breast cancer and to receive Zometa ? ?Heme/Onc history: Patient is a 61 year old female who was diagnosed with 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  She did not require adjuvant chemotherapy.  She was initially on tamoxifen which she could not tolerate and was switched to letrozole.  Last seen by me in June 2019 at which point she had a menstrual cycle and therefore not given another AI.  She had subsequently did not follow-up with me and remained off hormone therapy.   ?  ?More recently patient underwent a diagnostic bilateral mammogram which showed a 2.2 x 1.9 x 1.9 cm mass at the 12 o'clock position of the left breast 1 cm from the nipple.  She was also found to have 5 other smaller breast masses ranging from 0.3 to 0.8 cm.  Noted to have a solitary left axillary lymph node with cortical thickening.  She had a biopsy of the dominant left breast mass as well as another smaller breast mass and the left axillary lymph node all of which were positive for metastatic invasive mammary carcinoma grade 2.  Tumor was ER 91 200% positive PR 71 to 80%  positive and HER-2 IHC equivocal but negative by FISH ?  ?MRI of the bilateral breasts showed no abnormal findings in the right breast.  At least 6 discrete hypoechoic masses in the left breast with the largest one measuring 2.2 cm with 1 abnormal left axillary lymph node.  The size of the other breast masses ranging from 38m to 1.4 cm.  The total area of masses and non-mass enhancement was about 10 x 5 x 6.5 cm.  CT chest abdomen and pelvis with contrast showed no evidence of distant metastatic disease.  Bone scan was also negative. ?  ?MammaPrint done on the biopsy specimen came back as high risk with greater than 12% chemotherapy benefit.  Plan is for neoadjuvant dose dense AC-Taxol chemotherapy followed by surgery.  Start of chemotherapy was delayed since patient tested positive for Covid ?  ?Patient completed neoadjuvant ddAC- taxol chemo between feb-June 2022.  ?  ?Patient underwent left mastectomy with axillary lymph node dissection.  Final pathology showed 12 mm multifocal invasive mammary carcinoma at least 3 of them in number overall grade 2 with negative margins.  Metastatic carcinoma involving 3 out of 12 lymph nodes.  No extranodal extension identified.  Largest site of metastatic deposit 3 mm.  pT1c pN1a ?  ?Patient completed adjuvant radiation therapy in October 2022.  Patient is currently receiving Zometa every 3 months and will get that for 2 years. ?  ?  ? ?Interval history-reports that neuropathy in her feet is gradually getting worse and extends up to her knee.  She tried increasing the dose of gabapentin to 1800  mg daily dosing but could not tolerate it.  She is presently on 600 mg of gabapentin a day along with Cymbalta but it is not helping.  She had 2 episodes of falls.  She is working with physical therapy as well. ? ?ECOG PS- 1 ?Pain scale- 3 ?Opioid associated constipation- no ? ?Review of systems- Review of Systems  ?Constitutional:  Positive for malaise/fatigue. Negative for chills,  fever and weight loss.  ?HENT:  Negative for congestion, ear discharge and nosebleeds.   ?Eyes:  Negative for blurred vision.  ?Respiratory:  Negative for cough, hemoptysis, sputum production, shortness of breath and wheezing.   ?Cardiovascular:  Negative for chest pain, palpitations, orthopnea and claudication.  ?Gastrointestinal:  Negative for abdominal pain, blood in stool, constipation, diarrhea, heartburn, melena, nausea and vomiting.  ?Genitourinary:  Negative for dysuria, flank pain, frequency, hematuria and urgency.  ?Musculoskeletal:  Negative for back pain, joint pain and myalgias.  ?Skin:  Negative for rash.  ?Neurological:  Positive for sensory change (Peripheral neuropathy). Negative for dizziness, tingling, focal weakness, seizures, weakness and headaches.  ?Endo/Heme/Allergies:  Does not bruise/bleed easily.  ?Psychiatric/Behavioral:  Negative for depression and suicidal ideas. The patient does not have insomnia.    ? ? ? ?Allergies  ?Allergen Reactions  ? Triamcinolone Other (See Comments)  ?  Hypopigmentation s/p steroid injection  ? Tape Rash  ?  SURGICAL TAPE AFTER BREAST BIOPSY  ? ? ? ?Past Medical History:  ?Diagnosis Date  ? Anemia   ? Arthritis   ? Breast cancer ([place]) 08/2015  ? 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 Wide excision/ RT, T1a,N0. ER/PR pos, Her 2.neg. Tamoxifen x 6 months, d/c secondary to vasomotor sympotms.   ? Headache   ? Hypertension   ? Peripheral neuropathy   ? Personal history of radiation therapy 2016  ? RIGHT lumpectomy  ? Thyroid disease   ? ? ? ?Past Surgical History:  ?Procedure Laterality Date  ? ANTERIOR CRUCIATE LIGAMENT REPAIR Right   ? BREAST BIOPSY Right 07/17/2015  ? INVASIVE LOBULAR CARCINOMA, CLASSIC TYPE (1.5 MM), ARISING IN A   ? BREAST BIOPSY Left 09/19/2020  ? [place] bx 3 areas/ 1oc q clip/ 12 oc vision clip, axilla lymph node hydromark shape 3/ path pending  ? BREAST LUMPECTOMY Right 08/22/2015  ? BREAST LUMPECTOMY WITH SENTINEL LYMPH NODE  BIOPSY Right 08/22/2015  ? Procedure: BREAST LUMPECTOMY WITH SENTINEL LYMPH NODE BX;  Surgeon: Christene Lye, MD;  Location: [facility];  Service: General;  Laterality: Right;  ? COLONOSCOPY W/ POLYPECTOMY  2014  ? COLONOSCOPY WITH PROPOFOL N/A 04/16/2019  ? Procedure: COLONOSCOPY WITH BIOPSIES;  Surgeon: Lucilla Lame, MD;  Location: [facility];  Service: Endoscopy;  Laterality: N/A;  ? DILATION AND CURETTAGE OF UTERUS    ? 20 years ago  ? MASTECTOMY Left   ? 2021  ? NOVASURE ABLATION    ? POLYPECTOMY N/A 04/16/2019  ? Procedure: POLYPECTOMY;  Surgeon: Lucilla Lame, MD;  Location: [facility];  Service: Endoscopy;  Laterality: N/A;  ? PORTACATH PLACEMENT Right 12/01/2020  ? Procedure: INSERTION PORT-A-CATH;  Surgeon: [name], MD;  Location: [facility];  Service: General;  Laterality: Right;  ? TOTAL MASTECTOMY Left 05/06/2021  ? Procedure: modified radical mastectomy;  Surgeon: [name], MD;  Location: [facility];  Service: General;  Laterality: Left;  Provider requesting 1.5 hours / 90 minutes for procedure  ? ? ?Social History  ? ?Socioeconomic History  ? Marital status: Married  ?  Spouse name: Not on file  ? Number of children: Not on file  ? Years of education: Not on file  ? Highest education level: Not on file  ?Occupational History  ? Not on file  ?Tobacco Use  ? Smoking status: Every Day  ?  Packs/day: 0.75  ?  Years: 40.00  ?  Pack years: 30.00  ?  Types: Cigarettes  ? Smokeless tobacco: Never  ?Vaping Use  ? Vaping Use: Never used  ?Substance and Sexual Activity  ? Alcohol use: Yes  ?  Alcohol/week: 2.0 standard drinks  ?  Types: 2 Standard drinks or equivalent per week  ?  Comment: BEER 1-2 QD  ? Drug use: No  ? Sexual activity: Not Currently  ?  Birth control/protection: Surgical  ?  Comment: ablation  ?Other Topics Concern  ? Not on file  ?Social History Narrative  ? Not on file  ? ?Social Determinants of Health  ? ?Financial Resource Strain: Not on file  ?Food  Insecurity: Not on file  ?Transportation Needs: Not on file  ?Physical Activity: Not on file  ?Stress: Not on file  ?Social Connections: Not on file  ?Intimate Partner Violence: Not on file  ? ? ?Family History  ?

## 2022-03-17 NOTE — Progress Notes (Signed)
Pt has neuropathy and it is right foot and leg . She can only take gabapentin once a day because it makes er have bad dreams. She has fallen twice in 3 months ?

## 2022-03-18 ENCOUNTER — Encounter: Payer: 59 | Admitting: Physical Therapy

## 2022-03-18 ENCOUNTER — Inpatient Hospital Stay: Payer: 59

## 2022-03-18 VITALS — BP 151/96 | HR 105 | Temp 96.7°F | Resp 18

## 2022-03-18 DIAGNOSIS — Z170 Estrogen receptor positive status [ER+]: Secondary | ICD-10-CM

## 2022-03-18 DIAGNOSIS — C50912 Malignant neoplasm of unspecified site of left female breast: Secondary | ICD-10-CM | POA: Diagnosis not present

## 2022-03-18 MED ORDER — ZOLEDRONIC ACID 4 MG/100ML IV SOLN
4.0000 mg | INTRAVENOUS | Status: DC
  Administered 2022-03-18: 4 mg via INTRAVENOUS
  Filled 2022-03-18: qty 100

## 2022-03-18 MED ORDER — SODIUM CHLORIDE 0.9 % IV SOLN
Freq: Once | INTRAVENOUS | Status: AC
  Filled 2022-03-18: qty 250

## 2022-03-18 NOTE — Patient Instructions (Signed)
[facility] AT [place]  Discharge Instructions: Thank you for choosing [facility] to provide your oncology and hematology care.  If you have a lab appointment with the [facility], please go directly to the [facility] and check in at the registration area.  Wear comfortable clothing and clothing appropriate for easy access to any Portacath or PICC line.   We strive to give you quality time with your provider. You may need to reschedule your appointment if you arrive late (15 or more minutes).  Arriving late affects you and other patients whose appointments are after yours.  Also, if you miss three or more appointments without notifying the office, you may be dismissed from the clinic at the provider's discretion.      For prescription refill requests, have your pharmacy contact our office and allow 72 hours for refills to be completed.    Today you received the following chemotherapy and/or immunotherapy agents ZOMETA To help prevent nausea and vomiting after your treatment, we encourage you to take your nausea medication as directed.  BELOW ARE SYMPTOMS THAT SHOULD BE REPORTED IMMEDIATELY: *FEVER GREATER THAN 100.4 F (38 C) OR HIGHER *CHILLS OR SWEATING *NAUSEA AND VOMITING THAT IS NOT CONTROLLED WITH YOUR NAUSEA MEDICATION *UNUSUAL SHORTNESS OF BREATH *UNUSUAL BRUISING OR BLEEDING *URINARY PROBLEMS (pain or burning when urinating, or frequent urination) *BOWEL PROBLEMS (unusual diarrhea, constipation, pain near the anus) TENDERNESS IN MOUTH AND THROAT WITH OR WITHOUT PRESENCE OF ULCERS (sore throat, sores in mouth, or a toothache) UNUSUAL RASH, SWELLING OR PAIN  UNUSUAL VAGINAL DISCHARGE OR ITCHING   Items with * indicate a potential emergency and should be followed up as soon as possible or go to the Emergency Department if any problems should occur.  Please show the CHEMOTHERAPY ALERT CARD or IMMUNOTHERAPY ALERT CARD at check-in to the  Emergency Department and triage nurse.  Should you have questions after your visit or need to cancel or reschedule your appointment, please contact [facility] CANCER [place] AT [place]  [phone] and follow the prompts.  Office hours are 8:00 a.m. to 4:30 p.m. Monday - Friday. Please note that voicemails left after 4:00 p.m. may not be returned until the following business day.  We are closed weekends and major holidays. You have access to a nurse at all times for urgent questions. Please call the main number to the clinic [phone] and follow the prompts.  For any non-urgent questions, you may also contact your provider using MyChart. We now offer e-Visits for anyone 62 and older to request care online for non-urgent symptoms. For details visit [email or web address].   Also download the MyChart app! Go to the app store, search "MyChart", open the app, select [facility], and log in with your MyChart username and password.  Due to Covid, a mask is required upon entering the hospital/clinic. If you do not have a mask, one will be given to you upon arrival. For doctor visits, patients may have 1 support person aged 24 or older with them. For treatment visits, patients cannot have anyone with them due to current Covid guidelines and our immunocompromised population.   Zoledronic Acid Injection (Cancer) What is this medication? ZOLEDRONIC ACID (ZOE le dron ik AS id) treats high calcium levels in the blood caused by cancer. It may also be used with chemotherapy to treat weakened bones caused by cancer. It works by slowing down the release of calcium from bones. This lowers calcium levels in your blood. It also  makes your bones stronger and less likely to break (fracture). It belongs to a group of medications called bisphosphonates. This medicine may be used for other purposes; ask your health care provider or pharmacist if you have questions. COMMON BRAND NAME(S): Zometa, Zometa Powder What  should I tell my care team before I take this medication? They need to know if you have any of these conditions: Dehydration Dental disease Kidney disease Liver disease Low levels of calcium in the blood Lung or breathing disease, such as asthma Receiving steroids, such as dexamethasone or prednisone An unusual or allergic reaction to zoledronic acid, other medications, foods, dyes, or preservatives Pregnant or trying to get pregnant Breast-feeding How should I use this medication? This medication is injected into a vein. It is given by your care team in a hospital or clinic setting. Talk to your care team about the use of this medication in children. Special care may be needed. Overdosage: If you think you have taken too much of this medicine contact a poison control center or emergency room at once. NOTE: This medicine is only for you. Do not share this medicine with others. What if I miss a dose? Keep appointments for follow-up doses. It is important not to miss your dose. Call your care team if you are unable to keep an appointment. What may interact with this medication? Certain antibiotics given by injection Diuretics, such as bumetanide, furosemide NSAIDs, medications for pain and inflammation, such as ibuprofen or naproxen Teriparatide Thalidomide This list may not describe all possible interactions. Give your health care provider a list of all the medicines, herbs, non-prescription drugs, or dietary supplements you use. Also tell them if you smoke, drink alcohol, or use illegal drugs. Some items may interact with your medicine. What should I watch for while using this medication? Visit your care team for regular checks on your progress. It may be some time before you see the benefit from this medication. Some people who take this medication have severe bone, joint, or muscle pain. This medication may also increase your risk for jaw problems or a broken thigh bone. Tell your care  team right away if you have severe pain in your jaw, bones, joints, or muscles. Tell you care team if you have any pain that does not go away or that gets worse. Tell your dentist and dental surgeon that you are taking this medication. You should not have major dental surgery while on this medication. See your dentist to have a dental exam and fix any dental problems before starting this medication. Take good care of your teeth while on this medication. Make sure you see your dentist for regular follow-up appointments. You should make sure you get enough calcium and vitamin D while you are taking this medication. Discuss the foods you eat and the vitamins you take with your care team. Check with your care team if you have severe diarrhea, nausea, and vomiting, or if you sweat a lot. The loss of too much body fluid may make it dangerous for you to take this medication. You may need bloodwork while taking this medication. Talk to your care team if you wish to become pregnant or think you might be pregnant. This medication can cause serious birth defects. What side effects may I notice from receiving this medication? Side effects that you should report to your care team as soon as possible: Allergic reactions--skin rash, itching, hives, swelling of the face, lips, tongue, or throat Kidney injury--decrease in the  amount of urine, swelling of the ankles, hands, or feet Low calcium level--muscle pain or cramps, confusion, tingling, or numbness in the hands or feet Osteonecrosis of the jaw--pain, swelling, or redness in the mouth, numbness of the jaw, poor healing after dental work, unusual discharge from the mouth, visible bones in the mouth Severe bone, joint, or muscle pain Side effects that usually do not require medical attention (report to your care team if they continue or are bothersome): Constipation Fatigue Fever Loss of appetite Nausea Stomach pain This list may not describe all possible side  effects. Call your doctor for medical advice about side effects. You may report side effects to FDA at 1-800-FDA-1088. Where should I keep my medication? This medication is given in a hospital or clinic. It will not be stored at home. NOTE: This sheet is a summary. It may not cover all possible information. If you have questions about this medicine, talk to your doctor, pharmacist, or health care provider.  2023 Elsevier/Gold Standard (2021-12-11 00:00:00)

## 2022-03-19 ENCOUNTER — Encounter: Payer: Self-pay | Admitting: Physical Therapy

## 2022-03-19 ENCOUNTER — Ambulatory Visit: Payer: 59 | Admitting: Physical Therapy

## 2022-03-19 DIAGNOSIS — R2681 Unsteadiness on feet: Secondary | ICD-10-CM

## 2022-03-19 DIAGNOSIS — M6281 Muscle weakness (generalized): Secondary | ICD-10-CM

## 2022-03-19 NOTE — Therapy (Signed)
[facility] [address], [place], [address] Phone: [phone]   Fax:  [phone]  Physical Therapy Treatment  Patient Details  Name: Michelle Walker MRN: [id number] Date of Birth: 1961-08-16 No data recorded  Encounter Date: 03/19/2022   PT End of Session - 03/19/22 0841     Visit Number 8    Number of Visits 17    Date for PT Re-Evaluation 04/13/22    Authorization - Visit Number 8    Progress Note Due on Visit 10    PT Start Time 0831    PT Stop Time 0910    PT Time Calculation (min) 39 min    Equipment Utilized During Treatment Gait belt    Activity Tolerance Patient tolerated treatment well    Behavior During Therapy WFL for tasks assessed/performed             Past Medical History:  Diagnosis Date   Anemia    Arthritis    Breast cancer ([place]) 08/2015   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 Wide excision/ RT, T1a,N0. ER/PR pos, Her 2.neg. Tamoxifen x 6 months, d/c secondary to vasomotor sympotms.    Headache    Hypertension    Peripheral neuropathy    Personal history of radiation therapy 2016   RIGHT lumpectomy   Thyroid disease     Past Surgical History:  Procedure Laterality Date   ANTERIOR CRUCIATE LIGAMENT REPAIR Right    BREAST BIOPSY Right 07/17/2015   INVASIVE LOBULAR CARCINOMA, CLASSIC TYPE (1.5 MM), ARISING IN A    BREAST BIOPSY Left 09/19/2020   [place] bx 3 areas/ 1oc q clip/ 12 oc vision clip, axilla lymph node hydromark shape 3/ path pending   BREAST LUMPECTOMY Right 08/22/2015   BREAST LUMPECTOMY WITH SENTINEL LYMPH NODE BIOPSY Right 08/22/2015   Procedure: BREAST LUMPECTOMY WITH SENTINEL LYMPH NODE BX;  Surgeon: Christene Lye, MD;  Location: [facility];  Service: General;  Laterality: Right;   COLONOSCOPY W/ POLYPECTOMY  2014   COLONOSCOPY WITH PROPOFOL N/A 04/16/2019   Procedure: COLONOSCOPY WITH BIOPSIES;  Surgeon: Lucilla Lame, MD;  Location:  [facility];  Service: Endoscopy;  Laterality: N/A;   DILATION AND CURETTAGE OF UTERUS     20 years ago   MASTECTOMY Left    2021   [place] ABLATION     POLYPECTOMY N/A 04/16/2019   Procedure: POLYPECTOMY;  Surgeon: Lucilla Lame, MD;  Location: [facility];  Service: Endoscopy;  Laterality: N/A;   PORTACATH PLACEMENT Right 12/01/2020   Procedure: INSERTION PORT-A-CATH;  Surgeon: [name], MD;  Location: [facility];  Service: General;  Laterality: Right;   TOTAL MASTECTOMY Left 05/06/2021   Procedure: modified radical mastectomy;  Surgeon: [name], MD;  Location: [facility];  Service: General;  Laterality: Left;  Provider requesting 1.5 hours / 90 minutes for procedure    There were no vitals filed for this visit.   Subjective Assessment - 03/19/22 0837     Subjective Pt reports she has had some increased soreness following last session. Reports this soreness surrounds her thigh. Is having some increased tingling in bilat feet today. At work she is up on her feet a lot more, they have switched her assignment, which she thinks is also attributing to her soreness.    Pertinent History Patient arrives with report of chemo-induced peripheral neuropathy affecting BLE, right worse than left. Pt was diagnosed with breast cancer last year and  treated using chemotherapy starting in 12/2020. She states that it initially effected her hands and feet however has resolved in the hands. Her nails also turned dark in color - fingernails have grown out to normal color however toenails remain dark (associated with chemo and sensation changes). Patient endorses 1 fall over the past 6 months that occurred while walking through her home; she states she lost her balance and is now very careful. Now she looks down while walking due to feeling of imbalance. She does not use a cane or AD. She states turning her head while walking also seems to affect her balance. When she walks or stands for >  1 hour, she feels a pull in her R groin. 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 She is unsure how long her job will allow her to maintain this position.    Limitations Standing;House hold activities;Walking;Lifting    How long can you sit comfortably? unlimited    How long can you stand comfortably? <1 hour    How long can you walk comfortably? <1 hour    Patient Stated Goals to get 100% use and strength back into the legs              INTERVENTIONS   -NuStep warm-up at level 1 seat 6 UE 10 with straps tighted for increased DF demand   R hip flex with 5# football weight at toes for increased DF as well 3x 6 with encouragement   35f with 3#AW on R ankle to increase swing, foot clearance; cuing needed for bilat heel strike   Alt heel taps to 6in cone finger support x12 with 3# AW on R ankle; with cuing for "light tap" with good carry over  Half butterfly stretch 30sec bilat Thomas stretch 30sec RLE  Hamstring stretc 30sec bilat Gastroc stretch on step 30sec                                              PT Education - 03/19/22 0841     Education Details therex form/technique    Person(s) Educated Patient    Methods Explanation;Verbal cues;Demonstration    Comprehension Verbalized understanding;Returned demonstration;Verbal cues required              PT Short Term Goals - 02/16/22 1648       PT SHORT TERM GOAL #1   Title Pt will be independent with HEP in order to improve strength and balance in order to decrease fall risk and improve function at home and work.    Time 4    Period Weeks    Status New    Target Date 03/16/22               PT Long Term Goals - 02/26/22 1106       PT LONG TERM GOAL #1   Title Patient will increase FOTO score to equal to or greater than 61 to demonstrate statistically  significant improvement in mobility and quality of life.    Baseline 02/16/22: 51    Time 8    Period Weeks    Status New    Target Date 04/13/22      PT LONG TERM GOAL #2   Title Pt will improve FGA score to at  least 22/30 to demonstrate improvement in balance and decrease in fall risk at home and in community.    Baseline 02/16/22: 12/30    Time 8    Period Weeks    Status New    Target Date 04/13/22      PT LONG TERM GOAL #3   Title Patient will complete five times sit to stand test in <10 seconds indicating an increased LE strength and improved balance.    Baseline 02/16/22: 39 seconds    Time 8    Period Weeks    Status New    Target Date 04/13/22      PT LONG TERM GOAL #4   Title Patient will increase 10 meter walk test to >1.87ms as to improve gait speed for better community ambulation and to reduce fall risk.    Baseline 02/16/22: self-selected 0.441m, fast 0.8164m   Time 8    Period Weeks    Status New    Target Date 04/13/22      PT LONG TERM GOAL #5   Title Patient will increase six minute walk test distance to >[id number] demonstrate improved standing/walking endurance for full-duty at work and to be within the norms of this test for her age group.    Baseline 02/24/22: 1036ft15fTime 8    Period Weeks    Status New    Target Date 04/13/22                   Plan - 03/19/22 0911     Clinical Impression Statement PT continued therex progression for increased active DF with carry over in dynamic gait with CGA needed for safety. PT spent increased time at end of session with stretching and education on stretching and resting this weekend for recovery with good understanding. HEP updated wit printout given. Pt is motivated throughout session, with good carry over of cuing for porper technique of therex and dynamic balance tasks throughout session. PT will continue progressiona s able.    Personal Factors and Comorbidities Time since onset of  injury/illness/exacerbation;Behavior Pattern;Past/Current Experience;Comorbidity 3+    Comorbidities DM2, cancer, HTN    Examination-Activity Limitations Bend;Carry;Lift;Stairs;Squat;Stand;Transfers    Examination-Participation Restrictions Community Activity;Driving;Occupation;Yard Work;Cleaning    Stability/Clinical Decision Making Evolving/Moderate complexity    Clinical Decision Making Moderate    Rehab Potential Fair    PT Frequency 2x / week    PT Duration 8 weeks    PT Treatment/Interventions ADLs/Self Care Home Management;Aquatic Therapy;Electrical Stimulation;Iontophoresis '4mg'$ /ml Dexamethasone;Moist Heat;Traction;Ultrasound;DME Instruction;Parrafin;Gait training;Stair training;Therapeutic activities;Functional mobility training;Therapeutic exercise;Balance training;Neuromuscular re-education;Patient/family education;Orthotic Fit/Training;Dry needling;Compression bandaging;Manual techniques;Scar mobilization;Energy conservation;Passive range of motion;Splinting;Taping;Vestibular;Spinal Manipulations;Joint Manipulations    PT Next Visit Plan General LE strengthening, desensitization of feet, flexibility, standing balance    Consulted and Agree with Plan of Care Patient             Patient will benefit from skilled therapeutic intervention in order to improve the following deficits and impairments:  Abnormal gait, Decreased strength, Decreased balance, Decreased coordination, Difficulty walking  Visit Diagnosis: Unsteadiness on feet  Muscle weakness (generalized)     Problem List Patient Active Problem List   Diagnosis Date Noted   Trochanteric bursitis of right hip 09/10/2021   Pain in joint of right hip 09/10/2021   S/P mastectomy, left 05/12/2021   Breast cancer metastasized to axillary lymph node, right ([facility]) [place]06/2022   Prophylaxis for chemotherapy-induced neutropenia 01/16/2021   Genetic testing 11/26/2020   Goals of care, counseling/discussion 11/04/2020  Breast  cancer, left ([place]) 11/04/2020   History of colonic polyps    Polyp of ascending colon    Depression 09/20/2017   DM2 (diabetes mellitus, type 2) ([place]) 09/20/2017   Gout 09/20/2017   HTN (hypertension) 09/20/2017   Migraine 09/20/2017   History of endometrial ablation 02/26/2016   Malignant neoplasm of upper-outer quadrant of left breast in female, estrogen receptor positive ([place]) 08/04/2015   Durwin Reges DPT Durwin Reges, PT 03/19/2022, 9:15 AM  [place] [address], [place], [address] Phone: [phone]   Fax:  [phone]  Name: Michelle Walker MRN: [id number] Date of Birth: 04/27/1961

## 2022-03-22 ENCOUNTER — Ambulatory Visit: Payer: 59

## 2022-03-22 DIAGNOSIS — R2681 Unsteadiness on feet: Secondary | ICD-10-CM | POA: Diagnosis not present

## 2022-03-22 DIAGNOSIS — M6281 Muscle weakness (generalized): Secondary | ICD-10-CM

## 2022-03-22 NOTE — Therapy (Signed)
[facility] [address] [place], [address] Phone: [phone]   Fax:  [phone]  Physical Therapy Treatment  Patient Details  Name: Michelle Walker MRN: [id number] Date of Birth: Apr 24, 1961 No data recorded  Encounter Date: 03/22/2022   PT End of Session - 03/22/22 0751     Visit Number 9    Number of Visits 17    Date for PT Re-Evaluation 04/13/22    Authorization - Visit Number 9    Progress Note Due on Visit 10    PT Start Time 0746    PT Stop Time 0827    PT Time Calculation (min) 41 min    Equipment Utilized During Treatment Gait belt    Activity Tolerance Patient tolerated treatment well    Behavior During Therapy WFL for tasks assessed/performed             Past Medical History:  Diagnosis Date   Anemia    Arthritis    Breast cancer ([place]) 08/2015   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 Wide excision/ RT, T1a,N0. ER/PR pos, Her 2.neg. Tamoxifen x 6 months, d/c secondary to vasomotor sympotms.    Headache    Hypertension    Peripheral neuropathy    Personal history of radiation therapy 2016   RIGHT lumpectomy   Thyroid disease     Past Surgical History:  Procedure Laterality Date   ANTERIOR CRUCIATE LIGAMENT REPAIR Right    BREAST BIOPSY Right 07/17/2015   INVASIVE LOBULAR CARCINOMA, CLASSIC TYPE (1.5 MM), ARISING IN A    BREAST BIOPSY Left 09/19/2020   [place] bx 3 areas/ 1oc q clip/ 12 oc vision clip, axilla lymph node hydromark shape 3/ path pending   BREAST LUMPECTOMY Right 08/22/2015   BREAST LUMPECTOMY WITH SENTINEL LYMPH NODE BIOPSY Right 08/22/2015   Procedure: BREAST LUMPECTOMY WITH SENTINEL LYMPH NODE BX;  Surgeon: Christene Lye, MD;  Location: [facility];  Service: General;  Laterality: Right;   COLONOSCOPY W/ POLYPECTOMY  2014   COLONOSCOPY WITH PROPOFOL N/A 04/16/2019   Procedure: COLONOSCOPY WITH BIOPSIES;  Surgeon: Lucilla Lame, MD;  Location:  [facility];  Service: Endoscopy;  Laterality: N/A;   DILATION AND CURETTAGE OF UTERUS     20 years ago   MASTECTOMY Left    2021   [place] ABLATION     POLYPECTOMY N/A 04/16/2019   Procedure: POLYPECTOMY;  Surgeon: Lucilla Lame, MD;  Location: [facility];  Service: Endoscopy;  Laterality: N/A;   PORTACATH PLACEMENT Right 12/01/2020   Procedure: INSERTION PORT-A-CATH;  Surgeon: [name], MD;  Location: [facility];  Service: General;  Laterality: Right;   TOTAL MASTECTOMY Left 05/06/2021   Procedure: modified radical mastectomy;  Surgeon: [name], MD;  Location: [facility];  Service: General;  Laterality: Left;  Provider requesting 1.5 hours / 90 minutes for procedure    There were no vitals filed for this visit.   Subjective Assessment - 03/22/22 0749     Subjective Pt reports to be much more sore today, noting that she feels as though she takes one step forward and 4-5 steps back.  She notes today is one of the days she feels like she has regressed.  She also notes that they did change her medication and she's been taking it for the past 3 days, but it's not helped much yet.    Pertinent History Patient arrives with report of chemo-induced peripheral neuropathy affecting BLE,  right worse than left. Pt was diagnosed with breast cancer last year and treated using chemotherapy starting in 12/2020. She states that it initially effected her hands and feet however has resolved in the hands. Her nails also turned dark in color - fingernails have grown out to normal color however toenails remain dark (associated with chemo and sensation changes). Patient endorses 1 fall over the past 6 months that occurred while walking through her home; she states she lost her balance and is now very careful. Now she looks down while walking due to feeling of imbalance. She does not use a cane or AD. She states turning her head while walking also seems to affect her balance. When she walks  or stands for > 1 hour, she feels a pull in her R groin. 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 She is unsure how long her job will allow her to maintain this position.    Limitations Standing;House hold activities;Walking;Lifting    How long can you sit comfortably? unlimited    How long can you stand comfortably? <1 hour    How long can you walk comfortably? <1 hour    Patient Stated Goals to get 100% use and strength back into the legs    Currently in Pain? Yes    Pain Score 8     Pain Location Foot    Pain Orientation Right    Pain Descriptors / Indicators Numbness;Pins and needles;Shooting                  INTERVENTIONS   NuStep warm-up at level 1 seat 9 UE 10 with straps tightened for increased DF demand, 5 minutes  R hip flex with 5# football weight at toes and performing toe taps onto cones for increased recruitment for dorsiflexion, 3 cones, x6 taps each  431f with 3#AW on R ankle to increase swing, foot clearance; cuing needed for bilat heel strike  Supine LTR, 2x15 for increased lumbar mobility Supine butterfly stretch, 30 sec bouts bilaterally Supine Hamstring stretch, 30 sec bouts bilaterally Supine Figure 4 stretch, 30 sec bouts  bilaterally Supine nerve gliding bilaterally, 30 sec bouts bilaterally Supine gastroc stretch, 30 sec bilaterally     Assessment:  Pt performed well with increased bouts of exercise today, noting some muscular fatigue during performance, specifically with weighted cone taps.  Pt experienced muscle quivering with final set of cone taps, but still able to perform.  Pt also noted increased mobility in the hip following stretches.  Pt will continue to benefit from skilled therapy to address remaining deficits and improve overall mobility.           PT Short Term Goals - 02/16/22 1648       PT SHORT TERM  GOAL #1   Title Pt will be independent with HEP in order to improve strength and balance in order to decrease fall risk and improve function at home and work.    Time 4    Period Weeks    Status New    Target Date 03/16/22               PT Long Term Goals - 02/26/22 1106       PT LONG TERM GOAL #1   Title Patient will increase FOTO score to equal to or greater than 61 to demonstrate statistically significant improvement in mobility and quality of life.  Baseline 02/16/22: 51    Time 8    Period Weeks    Status New    Target Date 04/13/22      PT LONG TERM GOAL #2   Title Pt will improve FGA score to at least 22/30 to demonstrate improvement in balance and decrease in fall risk at home and in community.    Baseline 02/16/22: 12/30    Time 8    Period Weeks    Status New    Target Date 04/13/22      PT LONG TERM GOAL #3   Title Patient will complete five times sit to stand test in <10 seconds indicating an increased LE strength and improved balance.    Baseline 02/16/22: 39 seconds    Time 8    Period Weeks    Status New    Target Date 04/13/22      PT LONG TERM GOAL #4   Title Patient will increase 10 meter walk test to >1.42ms as to improve gait speed for better community ambulation and to reduce fall risk.    Baseline 02/16/22: self-selected 0.452m, fast 0.8167m   Time 8    Period Weeks    Status New    Target Date 04/13/22      PT LONG TERM GOAL #5   Title Patient will increase six minute walk test distance to >[id number] demonstrate improved standing/walking endurance for full-duty at work and to be within the norms of this test for her age group.    Baseline 02/24/22: 1036ft50fTime 8    Period Weeks    Status New    Target Date 04/13/22                   Plan - 03/22/22 0928     Clinical Impression Statement Pt performed well with increased bouts of exercise today, noting some muscular fatigue during performance, specifically with weighted cone  taps.  Pt experienced muscle quivering with final set of cone taps, but still able to perform.  Pt also noted increased mobility in the hip following stretches.  Pt will continue to benefit from skilled therapy to address remaining deficits and improve overall mobility.    Personal Factors and Comorbidities Time since onset of injury/illness/exacerbation;Behavior Pattern;Past/Current Experience;Comorbidity 3+    Comorbidities DM2, cancer, HTN    Examination-Activity Limitations Bend;Carry;Lift;Stairs;Squat;Stand;Transfers    Examination-Participation Restrictions Community Activity;Driving;Occupation;Yard Work;Cleaning    Stability/Clinical Decision Making Evolving/Moderate complexity    Rehab Potential Fair    PT Frequency 2x / week    PT Duration 8 weeks    PT Treatment/Interventions ADLs/Self Care Home Management;Aquatic Therapy;Electrical Stimulation;Iontophoresis '4mg'$ /ml Dexamethasone;Moist Heat;Traction;Ultrasound;DME Instruction;Parrafin;Gait training;Stair training;Therapeutic activities;Functional mobility training;Therapeutic exercise;Balance training;Neuromuscular re-education;Patient/family education;Orthotic Fit/Training;Dry needling;Compression bandaging;Manual techniques;Scar mobilization;Energy conservation;Passive range of motion;Splinting;Taping;Vestibular;Spinal Manipulations;Joint Manipulations    PT Next Visit Plan General LE strengthening, desensitization of feet, flexibility, standing balance    Consulted and Agree with Plan of Care Patient             Patient will benefit from skilled therapeutic intervention in order to improve the following deficits and impairments:  Abnormal gait, Decreased strength, Decreased balance, Decreased coordination, Difficulty walking  Visit Diagnosis: Unsteadiness on feet  Muscle weakness (generalized)     Problem List Patient Active Problem List   Diagnosis Date Noted   Trochanteric bursitis of right hip 09/10/2021   Pain in  joint of right hip 09/10/2021   S/P mastectomy, left 05/12/2021   Breast cancer  metastasized to axillary lymph node, right ([place]) 05/06/2021   Prophylaxis for chemotherapy-induced neutropenia 01/16/2021   Genetic testing 11/26/2020   Goals of care, counseling/discussion 11/04/2020   Breast cancer, left ([place]) 11/04/2020   History of colonic polyps    Polyp of ascending colon    Depression 09/20/2017   DM2 (diabetes mellitus, type 2) ([place]) 09/20/2017   Gout 09/20/2017   HTN (hypertension) 09/20/2017   Migraine 09/20/2017   History of endometrial ablation 02/26/2016   Malignant neoplasm of upper-outer quadrant of left breast in female, estrogen receptor positive ([place]) 08/04/2015    Gwenlyn Saran, PT, DPT 03/22/22, 9:30 AM   [facility] [address], [place], [address] Phone: [phone]   Fax:  [phone]  Name: Michelle Walker MRN: [id number] Date of Birth: Aug 14, 1961

## 2022-03-24 ENCOUNTER — Ambulatory Visit (INDEPENDENT_AMBULATORY_CARE_PROVIDER_SITE_OTHER): Payer: 59 | Admitting: Obstetrics and Gynecology

## 2022-03-24 ENCOUNTER — Encounter: Payer: Self-pay | Admitting: Obstetrics and Gynecology

## 2022-03-24 ENCOUNTER — Ambulatory Visit: Payer: 59 | Admitting: Physical Therapy

## 2022-03-24 VITALS — BP 119/82 | HR 111 | Ht 65.0 in | Wt 183.2 lb

## 2022-03-24 DIAGNOSIS — Z853 Personal history of malignant neoplasm of breast: Secondary | ICD-10-CM | POA: Diagnosis not present

## 2022-03-24 DIAGNOSIS — Z01818 Encounter for other preprocedural examination: Secondary | ICD-10-CM | POA: Diagnosis not present

## 2022-03-24 DIAGNOSIS — D069 Carcinoma in situ of cervix, unspecified: Secondary | ICD-10-CM

## 2022-03-24 MED ORDER — METRONIDAZOLE 500 MG PO TABS: 500.0000 mg | ORAL_TABLET | Freq: Two times a day (BID) | ORAL | 0 refills | Status: DC

## 2022-03-24 NOTE — Progress Notes (Signed)
PRE-OPERATIVE HISTORY AND PHYSICAL EXAM  PCP:  Casilda Carls, MD Subjective:   HPI:  Michelle Walker is a 61 y.o. [id number].  No LMP recorded. Patient has had an ablation.  She presents today for a pre-op discussion and PE.  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  She has a history of breast cancer.  Review of Systems:   Constitutional: Denied constitutional symptoms, night sweats, recent illness, fatigue, fever, insomnia and weight loss.  Eyes: Denied eye symptoms, eye pain, photophobia, vision change and visual disturbance.  Ears/Nose/Throat/Neck: Denied ear, nose, throat or neck symptoms, hearing loss, nasal discharge, sinus congestion and sore throat.  Cardiovascular: Denied cardiovascular symptoms, arrhythmia, chest pain/pressure, edema, exercise intolerance, orthopnea and palpitations.  Respiratory: Denied pulmonary symptoms, asthma, pleuritic pain, productive sputum, cough, dyspnea and wheezing.  Gastrointestinal: Denied, gastro-esophageal reflux, melena, nausea and vomiting.  Genitourinary: See HPI for additional information.  Musculoskeletal: Denied musculoskeletal symptoms, stiffness, swelling, muscle weakness and myalgia.  Dermatologic: Denied dermatology symptoms, rash and scar.  Neurologic: Denied neurology symptoms, dizziness, headache, neck pain and syncope.  Psychiatric: Denied psychiatric symptoms, anxiety and depression.  Endocrine: Denied endocrine symptoms including hot flashes and night sweats.   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 Wide excision/ RT, T1a,N0. ER/PR pos, Her 2.neg. Tamoxifen x 6 months, d/c secondary to vasomotor sympotms.    Headache    Hypertension    Peripheral neuropathy    Personal history of radiation therapy 2016   RIGHT lumpectomy   Thyroid disease     Past Surgical History:  Procedure Laterality Date   ANTERIOR CRUCIATE LIGAMENT REPAIR Right    BREAST BIOPSY Right 07/17/2015   INVASIVE LOBULAR CARCINOMA, CLASSIC TYPE (1.5 MM), ARISING IN A    BREAST BIOPSY Left 09/19/2020   [place] bx 3 areas/ 1oc q clip/ 12 oc vision clip, axilla lymph node hydromark shape 3/ path pending   BREAST LUMPECTOMY Right 08/22/2015   BREAST LUMPECTOMY WITH SENTINEL LYMPH NODE BIOPSY Right 08/22/2015   Procedure: BREAST LUMPECTOMY WITH SENTINEL LYMPH NODE BX;  Surgeon: Christene Lye, MD;  Location: [facility];  Service: General;  Laterality: Right;   COLONOSCOPY W/ POLYPECTOMY  2014   COLONOSCOPY WITH PROPOFOL N/A 04/16/2019   Procedure: COLONOSCOPY WITH BIOPSIES;  Surgeon: Lucilla Lame, MD;  Location: [place];  Service: Endoscopy;  Laterality: N/A;   DILATION AND CURETTAGE OF UTERUS     20 years ago   MASTECTOMY Left    2021   [employer] ABLATION     POLYPECTOMY N/A 04/16/2019   Procedure: POLYPECTOMY;  Surgeon: Lucilla Lame, MD;  Location: [place];  Service: Endoscopy;  Laterality: N/A;   PORTACATH PLACEMENT Right 12/01/2020   Procedure: INSERTION PORT-A-CATH;  Surgeon: [name], MD;  Location: [facility];  Service: General;  Laterality: Right;   TOTAL MASTECTOMY Left 05/06/2021   Procedure: modified radical mastectomy;  Surgeon: [name], MD;  Location: [facility];  Service: General;  Laterality: Left;  Provider requesting 1.5 hours / 90 minutes for procedure      SOCIAL HISTORY:  Social History   Tobacco Use  Smoking Status Every Day   Packs/day: 0.75   Years: 40.00   Pack years: 30.00   Types: Cigarettes   Smokeless Tobacco Never   Social History   Substance and Sexual Activity  Alcohol Use Yes   Alcohol/week: 2.0 standard drinks   Types: 2 Standard drinks or equivalent per week   Comment: BEER 1-2 QD    Social History   Substance and Sexual Activity  Drug Use No    Family History  Problem Relation Age of Onset   Stroke Mother    Cancer Sister        sarcoma on arm; chemo   Diabetes Sister    Stroke Sister    Diabetes Brother    Breast cancer Neg Hx    Ovarian cancer Neg Hx    Colon cancer Neg Hx    Heart disease Neg Hx     ALLERGIES:  Triamcinolone and Tape  MEDS:   Current Outpatient Medications on File Prior to Visit  Medication Sig Dispense Refill   allopurinol (ZYLOPRIM) 100 MG tablet Take 100 mg by mouth at bedtime.     amLODipine (NORVASC) 10 MG tablet Take 20 mg by mouth at bedtime.  0   Calcium Carb-Cholecalciferol (CALCIUM 600+D3 PO) Take 1 tablet by mouth at bedtime.     DULoxetine (CYMBALTA) 60 MG capsule Take 1 capsule (60 mg total) by mouth daily. 90 capsule 1   hydrochlorothiazide (HYDRODIURIL) 25 MG tablet Take 25 mg by mouth daily.     letrozole (FEMARA) 2.5 MG tablet TAKE 1 TABLET(2.5 MG) BY MOUTH DAILY(START AFTER RADIATION IS OVER) 30 tablet 3   losartan (COZAAR) 100 MG tablet Take 1 tablet by mouth daily.     metoprolol succinate (TOPROL-XL) 25 MG 24 hr tablet Take 50 mg by mouth at bedtime.  0   potassium chloride SA (KLOR-CON M) 20 MEQ tablet Take 1 tablet (20 mEq total) by mouth daily. 14 tablet 0   pregabalin (LYRICA) 75 MG capsule Take 1 capsule (75 mg total) by mouth 2 (two) times daily. 60 capsule 3   Current Facility-Administered Medications on File Prior to Visit  Medication Dose Route Frequency Provider Last Rate Last Admin   sodium chloride flush (NS) 0.9 % injection 10 mL  10 mL Intravenous PRN Sindy Guadeloupe, MD   10 mL at 03/27/21 0813    Meds ordered this encounter  Medications   metroNIDAZOLE (FLAGYL) 500 MG tablet    Sig:  Take 1 tablet (500 mg total) by mouth 2 (two) times daily. Begin 5 days prior to scheduled surgery as directed.    Dispense:  10 tablet    Refill:  0     Physical examination BP 119/82   Pulse (!) 111   Ht '5\' 5"'$  (1.651 m)   Wt 183 lb 3.2 oz (83.1 kg)   BMI 30.49 kg/m   General NAD, Conversant  HEENT Atraumatic; Op clear with mmm.  Normo-cephalic. Pupils reactive. Anicteric sclerae  Thyroid/Neck Smooth without nodularity or enlargement. Normal ROM.  Neck Supple.  Skin No rashes, lesions or ulceration. Normal palpated skin turgor. No nodularity.  Breasts: No masses or discharge.  Symmetric.  No axillary adenopathy.  Lungs: Clear to auscultation.No rales or wheezes. Normal Respiratory effort, no retractions.  Heart: NSR.  No murmurs or rubs appreciated. No periferal edema  Abdomen: Soft.  Non-tender.  No masses.  No HSM. No hernia  Extremities: Moves all appropriately.  Normal ROM for age. No lymphadenopathy.  Neuro: Oriented to PPT.  Normal mood. Normal affect.     Pelvic:   Vulva: Normal appearance.  No lesions.  Vagina: No lesions or abnormalities noted.  Support: Normal pelvic support.  Urethra No masses tenderness or scarring.  Meatus Normal size without lesions or prolapse.  Cervix: Normal ectropion.  No lesions.  Anus: Normal exam.  No lesions.  Perineum: Normal exam.  No lesions.   Assessment:   [id number] Patient Active Problem List   Diagnosis Date Noted   Trochanteric bursitis of right hip 09/10/2021   Pain in joint of right hip 09/10/2021   S/P mastectomy, left 05/12/2021   Breast cancer metastasized to axillary lymph node, right ([place]) 05/06/2021   Prophylaxis for chemotherapy-induced neutropenia 01/16/2021   Genetic testing 11/26/2020   Goals of care, counseling/discussion 11/04/2020   Breast cancer, left ([place]) 11/04/2020   History of colonic polyps    Polyp of ascending colon    Depression 09/20/2017   DM2 (diabetes mellitus, type 2) ([place]) 09/20/2017   Gout  09/20/2017   HTN (hypertension) 09/20/2017   Migraine 09/20/2017   History of endometrial ablation 02/26/2016   Malignant neoplasm of upper-outer quadrant of left breast in female, estrogen receptor positive ([place]) 08/04/2015    1. Pre-op exam   2. CIN III (cervical intraepithelial neoplasia grade III) with severe dysplasia   3. History of breast cancer      Plan:   Orders: Meds ordered this encounter  Medications   metroNIDAZOLE (FLAGYL) 500 MG tablet    Sig: Take 1 tablet (500 mg total) by mouth 2 (two) times daily. Begin 5 days prior to scheduled surgery as directed.    Dispense:  10 tablet    Refill:  0     1.  LAVH BSO  Pre-op discussions regarding Risks and Benefits of her scheduled surgery.  LAVH The procedure of Laparoscopic Assisted Vaginal Hysterectomy was described to the patient in detail.  We reviewed the rationale for Hysterectomy and the patient was again informed of other nonsurgical management possibilities for her condition.  She has considered these other options, and desires a Hysterectomy.  We have reviewed the fact that Hysterectomy is permanent and that following the procedure she will not be able to become pregnant or bear children.  We have discussed the following risk factors specifically and the patient has also been informed that additional complications not mentioned may develop:  Damage to bowel, bladder, ureters or to other internal organs, bleeding, infection and the risk from anesthesia.  We have discussed the procedure itself in detail and she has an informed understanding of this surgery.  We have also discussed the recovery period in which physical and sexual activity will be restricted for a varying degree of time, often 3 - 6 weeks. The Laparoscopic Portion of Hysterectomy has also been reviewed with the patient.  She understands how the laparoscope facilitates the procedure.  We have discussed the abdominal incisions and punctures that will be used.   We have also reviewed the increased Operating Room time often accompanying LAVH.  The slightly increased risk of complications secondary to abdominal punctures, and use of laparoscopic instrumentation has also been  discussed in detail.I have answered all of her questions and I believe the patient has an informed understanding of the procedure of Laparoscopic Assisted Vaginal Hysterectomy. Oophorectomy The option of Oophorectomy has been discussed with the patient.  Detailed risk/benefits have been reviewed.  The risks discussed include, but are not limited to, hemorrhage, infection, damage to ureter or other internal organ, and Ovarian Remnant Syndrome.  The benefits include a significant decrease in the risk of Ovarian Cancer and in benign Ovarian disease.  The risk of Ovarian CA has been estimated at 1 in 83.  This is a relatively small risk.  However, should Ovarian CA develop, it is often found late in the course of the disease.  We have also discussed the role of inheritance in the development of Ovarian disease.  Some women, who have close relatives with Ovarian CA, have a higher than 1 in 70 risk of Ovarian CA.  The benefits of Estrogen replacement therapy following Oophorectomy has been stressed.  If she is premenopausal, we have discussed the fact that this procedure will make her permanently sterile and that premature menopause will result if no ERT is begun.  I have answered all of her questions, and I believe that she has an adequate and informed understanding of the risks and benefits of Oophorectomy. She has decided that she would like oophorectomy performed.  This especially in relation to her history of breast cancer.  I spent 38 minutes involved in the care of this patient preparing to see the patient by obtaining and reviewing her medical history (including labs, imaging tests and prior procedures), documenting clinical information in the electronic health record (EHR), counseling and  coordinating care plans, writing and sending prescriptions, ordering tests or procedures and in direct communicating with the patient and medical staff discussing pertinent items from her history and physical exam.  Finis Bud, M.D. 03/24/2022 3:39 PM

## 2022-03-24 NOTE — H&P (View-Only) (Signed)
PRE-OPERATIVE HISTORY AND PHYSICAL EXAM  PCP:  Casilda Carls, MD Subjective:   HPI:  Michelle Walker is a 61 y.o. [id number].  No LMP recorded. Patient has had an ablation.  She presents today for a pre-op discussion and PE.  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  She has a history of breast cancer.  Review of Systems:   Constitutional: Denied constitutional symptoms, night sweats, recent illness, fatigue, fever, insomnia and weight loss.  Eyes: Denied eye symptoms, eye pain, photophobia, vision change and visual disturbance.  Ears/Nose/Throat/Neck: Denied ear, nose, throat or neck symptoms, hearing loss, nasal discharge, sinus congestion and sore throat.  Cardiovascular: Denied cardiovascular symptoms, arrhythmia, chest pain/pressure, edema, exercise intolerance, orthopnea and palpitations.  Respiratory: Denied pulmonary symptoms, asthma, pleuritic pain, productive sputum, cough, dyspnea and wheezing.  Gastrointestinal: Denied, gastro-esophageal reflux, melena, nausea and vomiting.  Genitourinary: See HPI for additional information.  Musculoskeletal: Denied musculoskeletal symptoms, stiffness, swelling, muscle weakness and myalgia.  Dermatologic: Denied dermatology symptoms, rash and scar.  Neurologic: Denied neurology symptoms, dizziness, headache, neck pain and syncope.  Psychiatric: Denied psychiatric symptoms, anxiety and depression.  Endocrine: Denied endocrine symptoms including hot flashes and night sweats.   OB History  Gravida Para Term Preterm AB Living  '2 2 2 '$ 0 0 2  SAB IAB Ectopic Multiple Live Births  0 0 0 0 2    # Outcome Date GA Lbr Len/2nd Weight Sex Delivery Anes PTL Lv  2 Term 1999    M Vag-Spont   LIV  1 Term 22    F Vag-Spont   LIV    Obstetric Comments  1st Menstrual Cycle:  10  1st Pregnancy:  32    Past Medical History:  Diagnosis Date   Anemia    Arthritis    Breast cancer (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 Wide excision/ RT, T1a,N0. ER/PR pos, Her 2.neg. Tamoxifen x 6 months, d/c secondary to vasomotor sympotms.    Headache    Hypertension    Peripheral neuropathy    Personal history of radiation therapy 2016   RIGHT lumpectomy   Thyroid disease     Past Surgical History:  Procedure Laterality Date   ANTERIOR CRUCIATE LIGAMENT REPAIR Right    BREAST BIOPSY Right 07/17/2015   INVASIVE LOBULAR CARCINOMA, CLASSIC TYPE (1.5 MM), ARISING IN A    BREAST BIOPSY Left 09/19/2020   [place] bx 3 areas/ 1oc q clip/ 12 oc vision clip, axilla lymph node hydromark shape 3/ path pending   BREAST LUMPECTOMY Right 08/22/2015   BREAST LUMPECTOMY WITH SENTINEL LYMPH NODE BIOPSY Right 08/22/2015   Procedure: BREAST LUMPECTOMY WITH SENTINEL LYMPH NODE BX;  Surgeon: Christene Lye, MD;  Location: [facility];  Service: General;  Laterality: Right;   COLONOSCOPY W/ POLYPECTOMY  2014   COLONOSCOPY WITH PROPOFOL N/A 04/16/2019   Procedure: COLONOSCOPY WITH BIOPSIES;  Surgeon: Lucilla Lame, MD;  Location: [place];  Service: Endoscopy;  Laterality: N/A;   DILATION AND CURETTAGE OF UTERUS     20 years ago   MASTECTOMY Left    2021   [place] ABLATION     POLYPECTOMY N/A 04/16/2019   Procedure: POLYPECTOMY;  Surgeon: Lucilla Lame, MD;  Location: [place];  Service: Endoscopy;  Laterality: N/A;   PORTACATH PLACEMENT Right 12/01/2020   Procedure: INSERTION PORT-A-CATH;  Surgeon: [name], MD;  Location: [facility];  Service: General;  Laterality: Right;   TOTAL MASTECTOMY Left 05/06/2021   Procedure: modified radical mastectomy;  Surgeon: [name], MD;  Location: [facility];  Service: General;  Laterality: Left;  Provider requesting 1.5 hours / 90 minutes for procedure      SOCIAL HISTORY:  Social History   Tobacco Use  Smoking Status Every Day   Packs/day: 0.75   Years: 40.00   Pack years: 30.00   Types: Cigarettes   Smokeless Tobacco Never   Social History   Substance and Sexual Activity  Alcohol Use Yes   Alcohol/week: 2.0 standard drinks   Types: 2 Standard drinks or equivalent per week   Comment: BEER 1-2 QD    Social History   Substance and Sexual Activity  Drug Use No    Family History  Problem Relation Age of Onset   Stroke Mother    Cancer Sister        sarcoma on arm; chemo   Diabetes Sister    Stroke Sister    Diabetes Brother    Breast cancer Neg Hx    Ovarian cancer Neg Hx    Colon cancer Neg Hx    Heart disease Neg Hx     ALLERGIES:  Triamcinolone and Tape  MEDS:   Current Outpatient Medications on File Prior to Visit  Medication Sig Dispense Refill   allopurinol (ZYLOPRIM) 100 MG tablet Take 100 mg by mouth at bedtime.     amLODipine (NORVASC) 10 MG tablet Take 20 mg by mouth at bedtime.  0   Calcium Carb-Cholecalciferol (CALCIUM 600+D3 PO) Take 1 tablet by mouth at bedtime.     DULoxetine (CYMBALTA) 60 MG capsule Take 1 capsule (60 mg total) by mouth daily. 90 capsule 1   hydrochlorothiazide (HYDRODIURIL) 25 MG tablet Take 25 mg by mouth daily.     letrozole (FEMARA) 2.5 MG tablet TAKE 1 TABLET(2.5 MG) BY MOUTH DAILY(START AFTER RADIATION IS OVER) 30 tablet 3   losartan (COZAAR) 100 MG tablet Take 1 tablet by mouth daily.     metoprolol succinate (TOPROL-XL) 25 MG 24 hr tablet Take 50 mg by mouth at bedtime.  0   potassium chloride SA (KLOR-CON M) 20 MEQ tablet Take 1 tablet (20 mEq total) by mouth daily. 14 tablet 0   pregabalin (LYRICA) 75 MG capsule Take 1 capsule (75 mg total) by mouth 2 (two) times daily. 60 capsule 3   Current Facility-Administered Medications on File Prior to Visit  Medication Dose Route Frequency Provider Last Rate Last Admin   sodium chloride flush (NS) 0.9 % injection 10 mL  10 mL Intravenous PRN Sindy Guadeloupe, MD   10 mL at 03/27/21 0813    Meds ordered this encounter  Medications   metroNIDAZOLE (FLAGYL) 500 MG tablet    Sig:  Take 1 tablet (500 mg total) by mouth 2 (two) times daily. Begin 5 days prior to scheduled surgery as directed.    Dispense:  10 tablet    Refill:  0     Physical examination BP 119/82   Pulse (!) 111   Ht '5\' 5"'$  (1.651 m)   Wt 183 lb 3.2 oz (83.1 kg)   BMI 30.49 kg/m   General NAD, Conversant  HEENT Atraumatic; Op clear with mmm.  Normo-cephalic. Pupils reactive. Anicteric sclerae  Thyroid/Neck Smooth without nodularity or enlargement. Normal ROM.  Neck Supple.  Skin No rashes, lesions or ulceration. Normal palpated skin turgor. No nodularity.  Breasts: No masses or discharge.  Symmetric.  No axillary adenopathy.  Lungs: Clear to auscultation.No rales or wheezes. Normal Respiratory effort, no retractions.  Heart: NSR.  No murmurs or rubs appreciated. No periferal edema  Abdomen: Soft.  Non-tender.  No masses.  No HSM. No hernia  Extremities: Moves all appropriately.  Normal ROM for age. No lymphadenopathy.  Neuro: Oriented to PPT.  Normal mood. Normal affect.     Pelvic:   Vulva: Normal appearance.  No lesions.  Vagina: No lesions or abnormalities noted.  Support: Normal pelvic support.  Urethra No masses tenderness or scarring.  Meatus Normal size without lesions or prolapse.  Cervix: Normal ectropion.  No lesions.  Anus: Normal exam.  No lesions.  Perineum: Normal exam.  No lesions.   Assessment:   [id number] Patient Active Problem List   Diagnosis Date Noted   Trochanteric bursitis of right hip 09/10/2021   Pain in joint of right hip 09/10/2021   S/P mastectomy, left 05/12/2021   Breast cancer metastasized to axillary lymph node, right ([place]) 05/06/2021   Prophylaxis for chemotherapy-induced neutropenia 01/16/2021   Genetic testing 11/26/2020   Goals of care, counseling/discussion 11/04/2020   Breast cancer, left ([place]) 11/04/2020   History of colonic polyps    Polyp of ascending colon    Depression 09/20/2017   DM2 (diabetes mellitus, type 2) ([place]) 09/20/2017   Gout  09/20/2017   HTN (hypertension) 09/20/2017   Migraine 09/20/2017   History of endometrial ablation 02/26/2016   Malignant neoplasm of upper-outer quadrant of left breast in female, estrogen receptor positive ([place]) 08/04/2015    1. Pre-op exam   2. CIN III (cervical intraepithelial neoplasia grade III) with severe dysplasia   3. History of breast cancer      Plan:   Orders: Meds ordered this encounter  Medications   metroNIDAZOLE (FLAGYL) 500 MG tablet    Sig: Take 1 tablet (500 mg total) by mouth 2 (two) times daily. Begin 5 days prior to scheduled surgery as directed.    Dispense:  10 tablet    Refill:  0     1.  LAVH BSO  Pre-op discussions regarding Risks and Benefits of her scheduled surgery.  LAVH The procedure of Laparoscopic Assisted Vaginal Hysterectomy was described to the patient in detail.  We reviewed the rationale for Hysterectomy and the patient was again informed of other nonsurgical management possibilities for her condition.  She has considered these other options, and desires a Hysterectomy.  We have reviewed the fact that Hysterectomy is permanent and that following the procedure she will not be able to become pregnant or bear children.  We have discussed the following risk factors specifically and the patient has also been informed that additional complications not mentioned may develop:  Damage to bowel, bladder, ureters or to other internal organs, bleeding, infection and the risk from anesthesia.  We have discussed the procedure itself in detail and she has an informed understanding of this surgery.  We have also discussed the recovery period in which physical and sexual activity will be restricted for a varying degree of time, often 3 - 6 weeks. The Laparoscopic Portion of Hysterectomy has also been reviewed with the patient.  She understands how the laparoscope facilitates the procedure.  We have discussed the abdominal incisions and punctures that will be used.   We have also reviewed the increased Operating Room time often accompanying LAVH.  The slightly increased risk of complications secondary to abdominal punctures, and use of laparoscopic instrumentation has also been  discussed in detail.I have answered all of her questions and I believe the patient has an informed understanding of the procedure of Laparoscopic Assisted Vaginal Hysterectomy. Oophorectomy The option of Oophorectomy has been discussed with the patient.  Detailed risk/benefits have been reviewed.  The risks discussed include, but are not limited to, hemorrhage, infection, damage to ureter or other internal organ, and Ovarian Remnant Syndrome.  The benefits include a significant decrease in the risk of Ovarian Cancer and in benign Ovarian disease.  The risk of Ovarian CA has been estimated at 1 in 20.  This is a relatively small risk.  However, should Ovarian CA develop, it is often found late in the course of the disease.  We have also discussed the role of inheritance in the development of Ovarian disease.  Some women, who have close relatives with Ovarian CA, have a higher than 1 in 70 risk of Ovarian CA.  The benefits of Estrogen replacement therapy following Oophorectomy has been stressed.  If she is premenopausal, we have discussed the fact that this procedure will make her permanently sterile and that premature menopause will result if no ERT is begun.  I have answered all of her questions, and I believe that she has an adequate and informed understanding of the risks and benefits of Oophorectomy. She has decided that she would like oophorectomy performed.  This especially in relation to her history of breast cancer.

## 2022-03-24 NOTE — Progress Notes (Signed)
Patient presents today for pre-op exam for hysterectomy. She states no concerns at this time.

## 2022-03-24 NOTE — H&P (Signed)
PRE-OPERATIVE HISTORY AND PHYSICAL EXAM  PCP:  Casilda Carls, MD Subjective:   HPI:  Michelle Walker is a 61 y.o. [id number].  No LMP recorded. Patient has had an ablation.  She presents today for a pre-op discussion and PE.  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  She has a history of breast cancer.  Review of Systems:   Constitutional: Denied constitutional symptoms, night sweats, recent illness, fatigue, fever, insomnia and weight loss.  Eyes: Denied eye symptoms, eye pain, photophobia, vision change and visual disturbance.  Ears/Nose/Throat/Neck: Denied ear, nose, throat or neck symptoms, hearing loss, nasal discharge, sinus congestion and sore throat.  Cardiovascular: Denied cardiovascular symptoms, arrhythmia, chest pain/pressure, edema, exercise intolerance, orthopnea and palpitations.  Respiratory: Denied pulmonary symptoms, asthma, pleuritic pain, productive sputum, cough, dyspnea and wheezing.  Gastrointestinal: Denied, gastro-esophageal reflux, melena, nausea and vomiting.  Genitourinary: See HPI for additional information.  Musculoskeletal: Denied musculoskeletal symptoms, stiffness, swelling, muscle weakness and myalgia.  Dermatologic: Denied dermatology symptoms, rash and scar.  Neurologic: Denied neurology symptoms, dizziness, headache, neck pain and syncope.  Psychiatric: Denied psychiatric symptoms, anxiety and depression.  Endocrine: Denied endocrine symptoms including hot flashes and night sweats.   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 Wide excision/ RT, T1a,N0. ER/PR pos, Her 2.neg. Tamoxifen x 6 months, d/c secondary to vasomotor sympotms.    Headache    Hypertension    Peripheral neuropathy    Personal history of radiation therapy 2016   RIGHT lumpectomy   Thyroid disease     Past Surgical History:  Procedure Laterality Date   ANTERIOR CRUCIATE LIGAMENT REPAIR Right    BREAST BIOPSY Right 07/17/2015   INVASIVE LOBULAR CARCINOMA, CLASSIC TYPE (1.5 MM), ARISING IN A    BREAST BIOPSY Left 09/19/2020   [place] bx 3 areas/ 1oc q clip/ 12 oc vision clip, axilla lymph node hydromark shape 3/ path pending   BREAST LUMPECTOMY Right 08/22/2015   BREAST LUMPECTOMY WITH SENTINEL LYMPH NODE BIOPSY Right 08/22/2015   Procedure: BREAST LUMPECTOMY WITH SENTINEL LYMPH NODE BX;  Surgeon: Christene Lye, MD;  Location: [facility];  Service: General;  Laterality: Right;   COLONOSCOPY W/ POLYPECTOMY  2014   COLONOSCOPY WITH PROPOFOL N/A 04/16/2019   Procedure: COLONOSCOPY WITH BIOPSIES;  Surgeon: Lucilla Lame, MD;  Location: [facility];  Service: Endoscopy;  Laterality: N/A;   DILATION AND CURETTAGE OF UTERUS     20 years ago   MASTECTOMY Left    2021   [place] ABLATION     POLYPECTOMY N/A 04/16/2019   Procedure: POLYPECTOMY;  Surgeon: Lucilla Lame, MD;  Location: [facility];  Service: Endoscopy;  Laterality: N/A;   PORTACATH PLACEMENT Right 12/01/2020   Procedure: INSERTION PORT-A-CATH;  Surgeon: [name], MD;  Location: [facility];  Service: General;  Laterality: Right;   TOTAL MASTECTOMY Left 05/06/2021   Procedure: modified radical mastectomy;  Surgeon: [name], MD;  Location: [facility];  Service: General;  Laterality: Left;  Provider requesting 1.5 hours / 90 minutes for procedure      SOCIAL HISTORY:  Social History   Tobacco Use  Smoking Status Every Day   Packs/day: 0.75   Years: 40.00   Pack years: 30.00   Types: Cigarettes   Smokeless Tobacco Never   Social History   Substance and Sexual Activity  Alcohol Use Yes   Alcohol/week: 2.0 standard drinks   Types: 2 Standard drinks or equivalent per week   Comment: BEER 1-2 QD    Social History   Substance and Sexual Activity  Drug Use No    Family History  Problem Relation Age of Onset   Stroke Mother    Cancer Sister        sarcoma on arm; chemo   Diabetes Sister    Stroke Sister    Diabetes Brother    Breast cancer Neg Hx    Ovarian cancer Neg Hx    Colon cancer Neg Hx    Heart disease Neg Hx     ALLERGIES:  Triamcinolone and Tape  MEDS:   Current Outpatient Medications on File Prior to Visit  Medication Sig Dispense Refill   allopurinol (ZYLOPRIM) 100 MG tablet Take 100 mg by mouth at bedtime.     amLODipine (NORVASC) 10 MG tablet Take 20 mg by mouth at bedtime.  0   Calcium Carb-Cholecalciferol (CALCIUM 600+D3 PO) Take 1 tablet by mouth at bedtime.     DULoxetine (CYMBALTA) 60 MG capsule Take 1 capsule (60 mg total) by mouth daily. 90 capsule 1   hydrochlorothiazide (HYDRODIURIL) 25 MG tablet Take 25 mg by mouth daily.     letrozole (FEMARA) 2.5 MG tablet TAKE 1 TABLET(2.5 MG) BY MOUTH DAILY(START AFTER RADIATION IS OVER) 30 tablet 3   losartan (COZAAR) 100 MG tablet Take 1 tablet by mouth daily.     metoprolol succinate (TOPROL-XL) 25 MG 24 hr tablet Take 50 mg by mouth at bedtime.  0   potassium chloride SA (KLOR-CON M) 20 MEQ tablet Take 1 tablet (20 mEq total) by mouth daily. 14 tablet 0   pregabalin (LYRICA) 75 MG capsule Take 1 capsule (75 mg total) by mouth 2 (two) times daily. 60 capsule 3   Current Facility-Administered Medications on File Prior to Visit  Medication Dose Route Frequency Provider Last Rate Last Admin   sodium chloride flush (NS) 0.9 % injection 10 mL  10 mL Intravenous PRN Sindy Guadeloupe, MD   10 mL at 03/27/21 0813    Meds ordered this encounter  Medications   metroNIDAZOLE (FLAGYL) 500 MG tablet    Sig:  Take 1 tablet (500 mg total) by mouth 2 (two) times daily. Begin 5 days prior to scheduled surgery as directed.    Dispense:  10 tablet    Refill:  0     Physical examination BP 119/82   Pulse (!) 111   Ht '5\' 5"'$  (1.651 m)   Wt 183 lb 3.2 oz (83.1 kg)   BMI 30.49 kg/m   General NAD, Conversant  HEENT Atraumatic; Op clear with mmm.  Normo-cephalic. Pupils reactive. Anicteric sclerae  Thyroid/Neck Smooth without nodularity or enlargement. Normal ROM.  Neck Supple.  Skin No rashes, lesions or ulceration. Normal palpated skin turgor. No nodularity.  Breasts: No masses or discharge.  Symmetric.  No axillary adenopathy.  Lungs: Clear to auscultation.No rales or wheezes. Normal Respiratory effort, no retractions.  Heart: NSR.  No murmurs or rubs appreciated. No periferal edema  Abdomen: Soft.  Non-tender.  No masses.  No HSM. No hernia  Extremities: Moves all appropriately.  Normal ROM for age. No lymphadenopathy.  Neuro: Oriented to PPT.  Normal mood. Normal affect.     Pelvic:   Vulva: Normal appearance.  No lesions.  Vagina: No lesions or abnormalities noted.  Support: Normal pelvic support.  Urethra No masses tenderness or scarring.  Meatus Normal size without lesions or prolapse.  Cervix: Normal ectropion.  No lesions.  Anus: Normal exam.  No lesions.  Perineum: Normal exam.  No lesions.   Assessment:   [id number] Patient Active Problem List   Diagnosis Date Noted   Trochanteric bursitis of right hip 09/10/2021   Pain in joint of right hip 09/10/2021   S/P mastectomy, left 05/12/2021   Breast cancer metastasized to axillary lymph node, right ([place]) 05/06/2021   Prophylaxis for chemotherapy-induced neutropenia 01/16/2021   Genetic testing 11/26/2020   Goals of care, counseling/discussion 11/04/2020   Breast cancer, left ([place]) 11/04/2020   History of colonic polyps    Polyp of ascending colon    Depression 09/20/2017   DM2 (diabetes mellitus, type 2) ([place]) 09/20/2017   Gout  09/20/2017   HTN (hypertension) 09/20/2017   Migraine 09/20/2017   History of endometrial ablation 02/26/2016   Malignant neoplasm of upper-outer quadrant of left breast in female, estrogen receptor positive ([place]) 08/04/2015    1. Pre-op exam   2. CIN III (cervical intraepithelial neoplasia grade III) with severe dysplasia   3. History of breast cancer      Plan:   Orders: Meds ordered this encounter  Medications   metroNIDAZOLE (FLAGYL) 500 MG tablet    Sig: Take 1 tablet (500 mg total) by mouth 2 (two) times daily. Begin 5 days prior to scheduled surgery as directed.    Dispense:  10 tablet    Refill:  0     1.  LAVH BSO  Pre-op discussions regarding Risks and Benefits of her scheduled surgery.  LAVH The procedure of Laparoscopic Assisted Vaginal Hysterectomy was described to the patient in detail.  We reviewed the rationale for Hysterectomy and the patient was again informed of other nonsurgical management possibilities for her condition.  She has considered these other options, and desires a Hysterectomy.  We have reviewed the fact that Hysterectomy is permanent and that following the procedure she will not be able to become pregnant or bear children.  We have discussed the following risk factors specifically and the patient has also been informed that additional complications not mentioned may develop:  Damage to bowel, bladder, ureters or to other internal organs, bleeding, infection and the risk from anesthesia.  We have discussed the procedure itself in detail and she has an informed understanding of this surgery.  We have also discussed the recovery period in which physical and sexual activity will be restricted for a varying degree of time, often 3 - 6 weeks. The Laparoscopic Portion of Hysterectomy has also been reviewed with the patient.  She understands how the laparoscope facilitates the procedure.  We have discussed the abdominal incisions and punctures that will be used.   We have also reviewed the increased Operating Room time often accompanying LAVH.  The slightly increased risk of complications secondary to abdominal punctures, and use of laparoscopic instrumentation has also been  discussed in detail.I have answered all of her questions and I believe the patient has an informed understanding of the procedure of Laparoscopic Assisted Vaginal Hysterectomy. Oophorectomy The option of Oophorectomy has been discussed with the patient.  Detailed risk/benefits have been reviewed.  The risks discussed include, but are not limited to, hemorrhage, infection, damage to ureter or other internal organ, and Ovarian Remnant Syndrome.  The benefits include a significant decrease in the risk of Ovarian Cancer and in benign Ovarian disease.  The risk of Ovarian CA has been estimated at 1 in 41.  This is a relatively small risk.  However, should Ovarian CA develop, it is often found late in the course of the disease.  We have also discussed the role of inheritance in the development of Ovarian disease.  Some women, who have close relatives with Ovarian CA, have a higher than 1 in 70 risk of Ovarian CA.  The benefits of Estrogen replacement therapy following Oophorectomy has been stressed.  If she is premenopausal, we have discussed the fact that this procedure will make her permanently sterile and that premature menopause will result if no ERT is begun.  I have answered all of her questions, and I believe that she has an adequate and informed understanding of the risks and benefits of Oophorectomy. She has decided that she would like oophorectomy performed.  This especially in relation to her history of breast cancer.

## 2022-03-30 ENCOUNTER — Ambulatory Visit: Payer: 59 | Admitting: Physical Therapy

## 2022-04-01 ENCOUNTER — Encounter: Payer: Self-pay | Admitting: Physical Therapy

## 2022-04-01 ENCOUNTER — Ambulatory Visit: Payer: 59 | Attending: Hospice and Palliative Medicine | Admitting: Physical Therapy

## 2022-04-01 DIAGNOSIS — M6281 Muscle weakness (generalized): Secondary | ICD-10-CM | POA: Insufficient documentation

## 2022-04-01 DIAGNOSIS — R2681 Unsteadiness on feet: Secondary | ICD-10-CM | POA: Insufficient documentation

## 2022-04-01 NOTE — Therapy (Signed)
[facility] [address], [place], [address] Phone: [phone]   Fax:  [phone]  Physical Therapy Treatment/Progress Note Reporting Period 02/16/22 - 04/01/22  Patient Details  Name: Michelle Walker MRN: [id number] Date of Birth: Dec 09, 1960 No data recorded  Encounter Date: 04/01/2022   PT End of Session - 04/01/22 0841     Visit Number 10    Number of Visits 17    Date for PT Re-Evaluation 04/13/22    Authorization - Visit Number 10    Progress Note Due on Visit 10    PT Start Time 0835    PT Stop Time 0915    PT Time Calculation (min) 40 min    Equipment Utilized During Treatment Gait belt    Activity Tolerance Patient tolerated treatment well    Behavior During Therapy WFL for tasks assessed/performed             Past Medical History:  Diagnosis Date   Anemia    Arthritis    Breast cancer ([place]) 08/2015   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 Wide excision/ RT, T1a,N0. ER/PR pos, Her 2.neg. Tamoxifen x 6 months, d/c secondary to vasomotor sympotms.    Headache    Hypertension    Peripheral neuropathy    Personal history of radiation therapy 2016   RIGHT lumpectomy   Thyroid disease     Past Surgical History:  Procedure Laterality Date   ANTERIOR CRUCIATE LIGAMENT REPAIR Right    BREAST BIOPSY Right 07/17/2015   INVASIVE LOBULAR CARCINOMA, CLASSIC TYPE (1.5 MM), ARISING IN A    BREAST BIOPSY Left 09/19/2020   [place] bx 3 areas/ 1oc q clip/ 12 oc vision clip, axilla lymph node hydromark shape 3/ path pending   BREAST LUMPECTOMY Right 08/22/2015   BREAST LUMPECTOMY WITH SENTINEL LYMPH NODE BIOPSY Right 08/22/2015   Procedure: BREAST LUMPECTOMY WITH SENTINEL LYMPH NODE BX;  Surgeon: Christene Lye, MD;  Location: [facility];  Service: General;  Laterality: Right;   COLONOSCOPY W/ POLYPECTOMY  2014   COLONOSCOPY WITH PROPOFOL N/A 04/16/2019   Procedure: COLONOSCOPY WITH  BIOPSIES;  Surgeon: Lucilla Lame, MD;  Location: [facility];  Service: Endoscopy;  Laterality: N/A;   DILATION AND CURETTAGE OF UTERUS     20 years ago   MASTECTOMY Left    2021   [place] ABLATION     POLYPECTOMY N/A 04/16/2019   Procedure: POLYPECTOMY;  Surgeon: Lucilla Lame, MD;  Location: [facility];  Service: Endoscopy;  Laterality: N/A;   PORTACATH PLACEMENT Right 12/01/2020   Procedure: INSERTION PORT-A-CATH;  Surgeon: [name], MD;  Location: [facility];  Service: General;  Laterality: Right;   TOTAL MASTECTOMY Left 05/06/2021   Procedure: modified radical mastectomy;  Surgeon: [name], MD;  Location: [facility];  Service: General;  Laterality: Left;  Provider requesting 1.5 hours / 90 minutes for procedure    There were no vitals filed for this visit.   Subjective Assessment - 04/01/22 0839     Subjective Patinet reports she is feeling 60% better overall. She has had no falls since last visit. Feels like she is doing better with learning to clear her foot to prevent falls, still is having R leg pain, fels like pins and needles, all the time.    Pertinent History Patient arrives with report of chemo-induced peripheral neuropathy affecting BLE, right worse than left. Pt was diagnosed with breast cancer last year and  treated using chemotherapy starting in 12/2020. She states that it initially effected her hands and feet however has resolved in the hands. Her nails also turned dark in color - fingernails have grown out to normal color however toenails remain dark (associated with chemo and sensation changes). Patient endorses 1 fall over the past 6 months that occurred while walking through her home; she states she lost her balance and is now very careful. Now she looks down while walking due to feeling of imbalance. She does not use a cane or AD. She states turning her head while walking also seems to affect her balance. When she walks or stands for > 1 hour,  she feels a pull in her R groin. 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 She is unsure how long her job will allow her to maintain this position.    Limitations Standing;House hold activities;Walking;Lifting    How long can you sit comfortably? unlimited    How long can you stand comfortably? <1 hour    How long can you walk comfortably? <1 hour    Patient Stated Goals to get 100% use and strength back into the legs             INTERVENTIONS -NuStep warm-up at level 1 seat 6 UE 10 with straps tighted for increased DF demand  5xSTS x2 trials avg time 12sec FGA with guarding for safety    Education on progress, decrerased fall risk evidenced by  PT reviewed the following HEP with patient with patient able to demonstrate a set of the following with min cuing for correction needed. PT educated patient on parameters of therex (how/when to inc/decrease intensity, frequency, rep/set range, stretch hold time, and purpose of therex) with verbalized understanding.   Access Code: [email or web address] - Gastroc Stretch with Foot at Wall  - 2 x daily - 7 x weekly - 30-60sec hold - Supine Butterfly Groin Stretch  - 2 x daily - 7 x weekly - 30-60sec hold - Squat with Chair Touch  - 1 x daily - 1 x weekly - 3 sets - 5-10 reps - Standing Toe Raises at Chair  - 1 x daily - 1 x weekly - 3 sets - 10 reps - Standing Tandem Balance with Unilateral Counter Support  - 1 x daily - 1 x weekly - 3 reps - 30sec hold       [facility] PT Assessment - 04/01/22 0001       Functional Gait  Assessment   Gait Level Surface Walks 20 ft in less than 7 sec but greater than 5.5 sec, uses assistive device, slower speed, mild gait deviations, or deviates 6-10 in outside of the 12 in walkway width.    Change in Gait Speed Able to change speed, demonstrates mild gait deviations, deviates 6-10 in outside of the 12 in  walkway width, or no gait deviations, unable to achieve a major change in velocity, or uses a change in velocity, or uses an assistive device.    Gait with Horizontal Head Turns Performs head turns smoothly with no change in gait. Deviates no more than 6 in outside 12 in walkway width    Gait with Vertical Head Turns Performs head turns with no change in gait. Deviates no more than 6 in outside 12 in walkway width.    Gait and Pivot Turn Pivot turns safely in greater than 3 sec  and stops with no loss of balance, or pivot turns safely within 3 sec and stops with mild imbalance, requires small steps to catch balance.    Step Over Obstacle Is able to step over one shoe box (4.5 in total height) without changing gait speed. No evidence of imbalance.    Gait with Narrow Base of Support Ambulates 7-9 steps.    Gait with Eyes Closed Walks 20 ft, uses assistive device, slower speed, mild gait deviations, deviates 6-10 in outside 12 in walkway width. Ambulates 20 ft in less than 9 sec but greater than 7 sec.    Ambulating Backwards Walks 20 ft, uses assistive device, slower speed, mild gait deviations, deviates 6-10 in outside 12 in walkway width.    Steps Alternating feet, must use rail.    Total Score 22                                    PT Education - 04/01/22 0841     Education Details therex form/technique    Person(s) Educated Patient    Methods Explanation;Demonstration;Verbal cues    Comprehension Verbalized understanding;Returned demonstration;Verbal cues required              PT Short Term Goals - 04/01/22 0842       PT SHORT TERM GOAL #1   Title Pt will be independent with HEP in order to improve strength and balance in order to decrease fall risk and improve function at home and work.    Baseline 04/01/22 completing without issue    Time 4    Period Weeks    Status Achieved               PT Long Term Goals - 04/01/22 2119       PT LONG TERM  GOAL #1   Title Patient will increase FOTO score to equal to or greater than 61 to demonstrate statistically significant improvement in mobility and quality of life.    Baseline 02/16/22: 51    Time 8    Period Weeks    Status Deferred      PT LONG TERM GOAL #2   Title Pt will improve FGA score to at least 22/30 to demonstrate improvement in balance and decrease in fall risk at home and in community.    Baseline 02/16/22: 12/30 04/01/22 22/30    Time 8    Period Weeks    Status Achieved      PT LONG TERM GOAL #3   Title Patient will complete five times sit to stand test in <10 seconds indicating an increased LE strength and improved balance.    Baseline 02/16/22: 39 seconds 04/01/22 13sec    Time 8    Period Weeks    Status On-going      PT LONG TERM GOAL #4   Title Patient will increase 10 meter walk test to >1.52ms as to improve gait speed for better community ambulation and to reduce fall risk.    Baseline 02/16/22: self-selected 0.4836m, fast 0.8164m 04/01/22: self-selected 0.83m29mfast 1.36m/s68m Time 8    Period Weeks    Status Achieved      PT LONG TERM GOAL #5   Title Patient will increase six minute walk test distance to >1765ft 5femonstrate improved standing/walking endurance for full-duty at work and to be within the norms of this test for her age group.  Baseline 02/24/22: 1073f    Time 8    Period Weeks    Status Deferred                   Plan - 04/01/22 1144     Clinical Impression Statement Reassessment completed this date where making is demonstrating excellent progress. Patient reports continued limitations in ADLs d/t pain, is working with neuro oncologist to on this neuropathic pain as well. Pt demonstrates decreased fall risk with ambulation speed and FGA- though she is at the cut off of FGA for fall risk. Patient demonstrates progress, but maintained strength deficits for normal BLE strength for ind transfers. 6MWT and FOTO deferred to complete next  session. PT updated patient HEP to reflect progress toward goals with patietn verbalizing and demonstrating understanding. Pt will continue to benefit from skilled PT to improve BLE strength needed for decreasing fall risk, increasing gait speed for community ambulation, and improving QOL.    Personal Factors and Comorbidities Time since onset of injury/illness/exacerbation;Behavior Pattern;Past/Current Experience;Comorbidity 3+    Comorbidities DM2, cancer, HTN    Examination-Activity Limitations Bend;Carry;Lift;Stairs;Squat;Stand;Transfers    Examination-Participation Restrictions Community Activity;Driving;Occupation;Yard Work;Cleaning    Stability/Clinical Decision Making Evolving/Moderate complexity    Clinical Decision Making Moderate    Rehab Potential Fair    PT Frequency 2x / week    PT Duration 8 weeks    PT Treatment/Interventions ADLs/Self Care Home Management;Aquatic Therapy;Electrical Stimulation;Iontophoresis '4mg'$ /ml Dexamethasone;Moist Heat;Traction;Ultrasound;DME Instruction;Parrafin;Gait training;Stair training;Therapeutic activities;Functional mobility training;Therapeutic exercise;Balance training;Neuromuscular re-education;Patient/family education;Orthotic Fit/Training;Dry needling;Compression bandaging;Manual techniques;Scar mobilization;Energy conservation;Passive range of motion;Splinting;Taping;Vestibular;Spinal Manipulations;Joint Manipulations    PT Next Visit Plan General LE strengthening, desensitization of feet, flexibility, standing balance    Consulted and Agree with Plan of Care Patient             Patient will benefit from skilled therapeutic intervention in order to improve the following deficits and impairments:  Abnormal gait, Decreased strength, Decreased balance, Decreased coordination, Difficulty walking  Visit Diagnosis: Unsteadiness on feet  Muscle weakness (generalized)     Problem List Patient Active Problem List   Diagnosis Date Noted    Trochanteric bursitis of right hip 09/10/2021   Pain in joint of right hip 09/10/2021   S/P mastectomy, left 05/12/2021   Breast cancer [place]as[place]illary [place], right [place] 05/06/[place]   Prophylaxis for chemotherapy-induced neutropenia 01/16/2021   Genetic testing 11/26/2020   Goals of care, counseling/discussion 11/04/2020   [place]ast [place] ([place] 11/04/[place]   History of colonic polyps    Polyp of ascending colon    Depression 09/20/2017   DM2 (diabetes mellitus, type 2) ([place] 09/20/2017   Gout 09/20/2017   HTN (hypertension) 09/20/2017   Migraine 09/20/2017   History of endometrial ablation 02/26/2016   Malignant neoplasm of upper-outer quadrant of left breast in female, estrogen receptor positive ([place] 08/04/2015   CDurwin RegesDPT CDurwin Reges PT 04/01/2022, 11:53 AM  [facility] [address] [place] [address]Phone: [phone]  Fax:  [phone] Name: Michelle HANRATTYMRN: [id number] of Birth: [birth year]

## 2022-04-02 ENCOUNTER — Inpatient Hospital Stay: Payer: 59 | Attending: Internal Medicine | Admitting: Internal Medicine

## 2022-04-02 VITALS — BP 119/84 | HR 108 | Temp 97.5°F | Resp 16 | Wt 185.0 lb

## 2022-04-02 DIAGNOSIS — Z808 Family history of malignant neoplasm of other organs or systems: Secondary | ICD-10-CM | POA: Insufficient documentation

## 2022-04-02 DIAGNOSIS — I10 Essential (primary) hypertension: Secondary | ICD-10-CM | POA: Insufficient documentation

## 2022-04-02 DIAGNOSIS — F17210 Nicotine dependence, cigarettes, uncomplicated: Secondary | ICD-10-CM | POA: Insufficient documentation

## 2022-04-02 DIAGNOSIS — Z170 Estrogen receptor positive status [ER+]: Secondary | ICD-10-CM | POA: Insufficient documentation

## 2022-04-02 DIAGNOSIS — G620 Drug-induced polyneuropathy: Secondary | ICD-10-CM | POA: Diagnosis not present

## 2022-04-02 DIAGNOSIS — Z79899 Other long term (current) drug therapy: Secondary | ICD-10-CM | POA: Insufficient documentation

## 2022-04-02 DIAGNOSIS — M5417 Radiculopathy, lumbosacral region: Secondary | ICD-10-CM | POA: Diagnosis not present

## 2022-04-02 DIAGNOSIS — M545 Low back pain, unspecified: Secondary | ICD-10-CM | POA: Insufficient documentation

## 2022-04-02 DIAGNOSIS — C50912 Malignant neoplasm of unspecified site of left female breast: Secondary | ICD-10-CM | POA: Diagnosis present

## 2022-04-02 DIAGNOSIS — Z809 Family history of malignant neoplasm, unspecified: Secondary | ICD-10-CM

## 2022-04-02 DIAGNOSIS — T451X5A Adverse effect of antineoplastic and immunosuppressive drugs, initial encounter: Secondary | ICD-10-CM

## 2022-04-02 MED ORDER — PREGABALIN 75 MG PO CAPS: 150.0000 mg | ORAL_CAPSULE | Freq: Two times a day (BID) | ORAL | 3 refills | Status: DC

## 2022-04-02 NOTE — Progress Notes (Signed)
New referral for peripheral neuropathy. Pt states that symptoms started about 1 year ago after starting chemotherapy. She reports bilateral lower extremity numbness and tingling, worse in the right foot. She states that she experiences numbness, tingling, shooting pains, and ankle pains on the right. Has tried gabapentin in the past, but did not like the side effects. Currently taking Lyrica '75mg'$  BID.

## 2022-04-02 NOTE — Progress Notes (Signed)
[facility] [place], [place] [address] [phone]   New Patient Evaluation  Date of Service: 04/02/22 Patient Name: Michelle Walker Patient MRN: [id number] Patient DOB: September 27, 1961 Provider: Ventura Sellers, MD  Identifying Statement:  Michelle Walker is a 61 y.o. female with Chemotherapy-induced neuropathy [facility]) who presents for initial consultation and evaluation regarding cancer associated neurologic deficits.    Referring Provider: Sindy Guadeloupe, MD [address] [place],  [place] [address]  Primary Cancer:  Oncologic History: Oncology History  Breast cancer, left ([place])  11/04/2020 Initial Diagnosis   Breast cancer, left ([place])    11/04/2020 Cancer Staging   Staging form: Breast, AJCC 8th Edition - Clinical stage from 11/04/2020: Stage IIA (cT2, cN1, cM0, G2, ER+, PR+, HER2-) - Signed by Sindy Guadeloupe, MD on 11/04/2020    12/04/2020 -  Chemotherapy    Patient is on Treatment Plan: BREAST ADJUVANT DOSE DENSE AC Q14D / PACLITAXEL Q7D        Genetic Testing   Negative genetic testing. No pathogenic variants identified on the [facility] CancerNext-Expanded+RNA panel. 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 The report date is 11/19/2020.  The CancerNext-Expanded + RNAinsight gene panel offered by [employer] and includes sequencing and rearrangement analysis for the following 77 genes: IP, ALK, APC*, ATM*, AXIN2, BAP1, BARD1, BLM, BMPR1A, BRCA1*, BRCA2*, BRIP1*, CDC73, CDH1*,CDK4, CDKN1B, CDKN2A, CHEK2*, CTNNA1, DICER1, FANCC, FH, FLCN, GALNT12, KIF1B, LZTR1, MAX, MEN1, MET, MLH1*, MSH2*, MSH3, MSH6*, MUTYH*, NBN, NF1*, NF2, NTHL1, PALB2*, PHOX2B, PMS2*, POT1, PRKAR1A, PTCH1, PTEN*, RAD51C*, RAD51D*,RB1, RECQL, RET, SDHA, SDHAF2, SDHB, SDHC, SDHD, SMAD4, SMARCA4, SMARCB1, SMARCE1, STK11, SUFU, TMEM127, TP53*,TSC1, TSC2, VHL and XRCC2 (sequencing and deletion/duplication); EGFR, EGLN1, HOXB13, KIT, MITF, PDGFRA, POLD1 and  POLE (sequencing only); EPCAM and GREM1 (deletion/duplication only). DNA and RNA analyses performed for * genes.   06/02/2021 Cancer Staging   Staging form: Breast, AJCC 8th Edition - Pathologic stage from 06/02/2021: No Stage Recommended (ypT1c, pN1a, cM0, G2, ER+, PR+, HER2-) - Signed by Sindy Guadeloupe, MD on 06/06/2021 Stage prefix: Post-therapy Multigene prognostic tests performed: None Histologic grading system: 3 grade system      History of Present Illness: The patient's records from the referring physician were obtained and reviewed and the patient interviewed to confirm this HPI.  Michelle Walker presents today for evaluation of neuropathy symptoms.  She describes one year history of painful burning, tingling, numbness affecting both feet and lower legs.  This started following taxol chemotherapy administration for breast cancer.  She also complains of greater than 1 year history of shooting pain down the right leg, including some weakness and dragging of the leg.  This clearly preceded her cancer diagnosis.  This is associated with midline lower back pain.  Currently taking Lyrica 73m BID and Cymbalta 613mdaily.    Medications: Current Outpatient Medications on File Prior to Visit  Medication Sig Dispense Refill   allopurinol (ZYLOPRIM) 100 MG tablet Take 100 mg by mouth at bedtime.     amLODipine (NORVASC) 10 MG tablet Take 20 mg by mouth at bedtime.  0   Calcium Carb-Cholecalciferol (CALCIUM 600+D3 PO) Take 1 tablet by mouth at bedtime.     DULoxetine (CYMBALTA) 60 MG capsule Take 1 capsule (60 mg total) by mouth daily. 90 capsule 1   hydrochlorothiazide (HYDRODIURIL) 25 MG tablet Take 25 mg by mouth daily.     letrozole (FEMARA) 2.5 MG tablet TAKE 1 TABLET(2.5  MG) BY MOUTH DAILY(START AFTER RADIATION IS OVER) 30 tablet 3   losartan (COZAAR) 100 MG tablet Take 1 tablet by mouth daily.     metoprolol succinate (TOPROL-XL) 25 MG 24 hr tablet Take 50 mg by mouth at bedtime.  0    metroNIDAZOLE (FLAGYL) 500 MG tablet Take 1 tablet (500 mg total) by mouth 2 (two) times daily. Begin 5 days prior to scheduled surgery as directed. 10 tablet 0   potassium chloride SA (KLOR-CON M) 20 MEQ tablet Take 1 tablet (20 mEq total) by mouth daily. 14 tablet 0   pregabalin (LYRICA) 75 MG capsule Take 1 capsule (75 mg total) by mouth 2 (two) times daily. 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 Wide excision/ RT, T1a,N0. ER/PR pos, Her 2.neg. Tamoxifen x 6 months, d/c secondary to vasomotor sympotms.    Headache    Hypertension    Peripheral neuropathy    Personal history of radiation therapy 2016   RIGHT lumpectomy   Thyroid disease    Past Surgical History:  Past Surgical History:  Procedure Laterality Date   ANTERIOR CRUCIATE LIGAMENT REPAIR Right    BREAST BIOPSY Right 07/17/2015   INVASIVE LOBULAR CARCINOMA, CLASSIC TYPE (1.5 MM), ARISING IN A    BREAST BIOPSY Left 09/19/2020   [place] bx 3 areas/ 1oc q clip/ 12 oc vision clip, axilla lymph node hydromark shape 3/ path pending   BREAST LUMPECTOMY Right 08/22/2015   BREAST LUMPECTOMY WITH SENTINEL LYMPH NODE BIOPSY Right 08/22/2015   Procedure: BREAST LUMPECTOMY WITH SENTINEL LYMPH NODE BX;  Surgeon: Christene Lye, MD;  Location: [facility];  Service: General;  Laterality: Right;   COLONOSCOPY W/ POLYPECTOMY  2014   COLONOSCOPY WITH PROPOFOL N/A 04/16/2019    Procedure: COLONOSCOPY WITH BIOPSIES;  Surgeon: Lucilla Lame, MD;  Location: [facility];  Service: Endoscopy;  Laterality: N/A;   DILATION AND CURETTAGE OF UTERUS     20 years ago   MASTECTOMY Left    2021   [place] ABLATION     POLYPECTOMY N/A 04/16/2019   Procedure: POLYPECTOMY;  Surgeon: Lucilla Lame, MD;  Location: [place];  Service: Endoscopy;  Laterality: N/A;   PORTACATH PLACEMENT Right 12/01/2020   Procedure: INSERTION PORT-A-CATH;  Surgeon: [name], MD;  Location: [facility];  Service: General;  Laterality: Right;   TOTAL MASTECTOMY Left 05/06/2021   Procedure: modified radical mastectomy;  Surgeon: [name], MD;  Location: [facility];  Service: General;  Laterality: Left;  Provider requesting 1.5 hours / 90 minutes for procedure   Social History:  Social History   Socioeconomic History   Marital status: Married    Spouse name: Not on file   Number of children: Not on file   Years of education: Not on file   Highest education level: Not on file  Occupational History  Not on file  Tobacco Use   Smoking status: Every Day    Packs/day: 0.75    Years: 40.00    Pack years: 30.00    Types: Cigarettes   Smokeless tobacco: Never  Vaping Use   Vaping Use: Never used  Substance and Sexual Activity   Alcohol use: Yes    Alcohol/week: 2.0 standard drinks    Types: 2 Standard drinks or equivalent per week    Comment: BEER 1-2 QD   Drug use: No   Sexual activity: Yes    Birth control/protection: Surgical    Comment: ablation  Other Topics Concern   Not on file  Social History Narrative   Not on file   Social Determinants of Health   Financial Resource Strain: Not on file  Food Insecurity: Not on file  Transportation Needs: Not on file  Physical Activity: Not on file  Stress: Not on file  Social Connections: Not on file  Intimate Partner Violence: Not on file   Family History:  Family History  Problem Relation Age of Onset    Stroke Mother    Cancer Sister        sarcoma on arm; chemo   Diabetes Sister    Stroke Sister    Diabetes Brother    Breast cancer Neg Hx    Ovarian cancer Neg Hx    Colon cancer Neg Hx    Heart disease Neg Hx     Review of Systems: Constitutional: Doesn't report fevers, chills or abnormal weight loss Eyes: Doesn't report blurriness of vision Ears, nose, mouth, throat, and face: Doesn't report sore throat Respiratory: Doesn't report cough, dyspnea or wheezes Cardiovascular: Doesn't report palpitation, chest discomfort  Gastrointestinal:  Doesn't report nausea, constipation, diarrhea GU: Doesn't report incontinence Skin: Doesn't report skin rashes Neurological: Per HPI Musculoskeletal: Doesn't report joint pain Behavioral/Psych: Doesn't report anxiety  Physical Exam: Vitals:   04/02/22 0909  BP: 119/84  Pulse: (!) 108  Resp: 16  Temp: (!) 97.5 F (36.4 C)   KPS: 80. General: Alert, cooperative, pleasant, in no acute distress Head: Normal EENT: No conjunctival injection or scleral icterus.  Lungs: Resp effort normal Cardiac: Regular rate Abdomen: Non-distended abdomen Skin: No rashes cyanosis or petechiae. Extremities: No clubbing or edema  Neurologic Exam: Mental Status: Awake, alert, attentive to examiner. Oriented to self and environment. Language is fluent with intact comprehension.  Cranial Nerves: Visual acuity is grossly normal. Visual fields are full. Extra-ocular movements intact. No ptosis. Face is symmetric Motor: Tone and bulk are normal. Power is full in both arms and legs, except subtle weakness of left leg with ambulation. Reflexes are trace, no pathologic reflexes present.  Sensory: Stocking neuropathic changes Gait: Hemiparetic   Labs: I have reviewed the data as listed    Component Value Date/Time   NA 132 (L) 03/17/2022 0837   K 3.2 (L) 03/17/2022 0837   K 3.6 10/18/2013 0848   CL 98 03/17/2022 0837   CO2 27 03/17/2022 0837   GLUCOSE 105  (H) 03/17/2022 0837   BUN 17 03/17/2022 0837   CREATININE 0.89 03/17/2022 0837   CALCIUM 9.3 03/17/2022 0837   PROT 7.5 03/17/2022 0837   ALBUMIN 3.7 03/17/2022 0837   AST 20 03/17/2022 0837   ALT 12 03/17/2022 0837   ALKPHOS 76 03/17/2022 0837   BILITOT 0.6 03/17/2022 0837   GFRNONAA >60 03/17/2022 0837   GFRAA >60 12/07/2017 1525   Lab Results  Component Value Date   WBC 5.8  03/17/2022   NEUTROABS 4.3 03/17/2022   HGB 13.3 03/17/2022   HCT 40.6 03/17/2022   MCV 84.6 03/17/2022   PLT 196 03/17/2022     Assessment/Plan Chemotherapy-induced neuropathy (HCC)  Michelle Walker presents with clinical syndrome consistent with symmetric, length dependent, small and large fiber peripheral neuropathy.  Etiology is exposure to taxol chemotherapy.  We reviewed pathophysiology of chemotherapy induced neuropathy, available treatments, and goals of care.  She also demonstrates clinical signs of lumbosacral radiculopathy on the right.  We will recommend contrast enhanced MRI of the lumbar spine given concurrent cancer.  Lyrica may be increased to 150m BID.  She would prefer to continue Cymbalta at current dose level.    We spent twenty additional minutes teaching regarding the natural history, biology, and historical experience in the treatment of neurologic complications of cancer.   We appreciate the opportunity to participate in the care of Michelle Walker  She should return to clinic after upcoming MRI spine.  All questions were answered. The patient knows to call the clinic with any problems, questions or concerns. No barriers to learning were detected.  The total time spent in the encounter was 40 minutes and more than 50% was on counseling and review of test results   ZVentura Sellers MD Medical Director of Neuro-Oncology [facility]06/02/23 9:00 AM

## 2022-04-05 ENCOUNTER — Other Ambulatory Visit: Payer: Self-pay

## 2022-04-05 ENCOUNTER — Encounter
Admission: RE | Admit: 2022-04-05 | Discharge: 2022-04-05 | Disposition: A | Discharge status: Home / Self Care | Payer: 59 | Source: Ambulatory Visit | Attending: Obstetrics and Gynecology | Admitting: Obstetrics and Gynecology

## 2022-04-05 ENCOUNTER — Ambulatory Visit: Payer: 59 | Admitting: Physical Therapy

## 2022-04-05 DIAGNOSIS — Z01812 Encounter for preprocedural laboratory examination: Secondary | ICD-10-CM

## 2022-04-05 NOTE — Patient Instructions (Addendum)
Your procedure is scheduled on: 04/12/22 - Monday Report to the Registration Desk on the 1st floor of the [facility]. To find out your arrival time, please call [phone] between 1PM - 3PM on: 04/09/22 - Friday If your arrival time is 6:00 am, do not arrive prior to that time as the [facility] entrance doors do not open until 6:00 am.  REMEMBER: Instructions that are not followed completely may result in serious medical risk, up to and including death; or upon the discretion of your surgeon and anesthesiologist your surgery may need to be rescheduled.  Do not eat food or drink any fluids after midnight the night before surgery.  No gum chewing, lozengers or hard candies.  TAKE THESE MEDICATIONS THE MORNING OF SURGERY WITH A SIP OF WATER:  - DULoxetine (CYMBALTA) 60 MG capsule - pregabalin (LYRICA) 75 MG capsule - letrozole (FEMARA) 2.5 MG tablet - potassium chloride SA (KLOR-CON M) 20 MEQ tablet  One week prior to surgery: Stop Anti-inflammatories (NSAIDS) such as Advil, Aleve, Ibuprofen, Motrin, Naproxen, Naprosyn and Aspirin based products such as Excedrin, Goodys Powder, BC Powder.  Stop ANY OVER THE COUNTER supplements until after surgery.  You may take Tylenol if needed for pain up until the day of surgery.  No Alcohol for 24 hours before or after surgery.  No Smoking including e-cigarettes for 24 hours prior to surgery.  No chewable tobacco products for at least 6 hours prior to surgery.  No nicotine patches on the day of surgery.  Do not use any "recreational" drugs for at least a week prior to your surgery.  Please be advised that the combination of cocaine and anesthesia may have negative outcomes, up to and including death. If you test positive for cocaine, your surgery will be cancelled.  On the morning of surgery brush your teeth with toothpaste and water, you may rinse your mouth with mouthwash if you wish. Do not swallow any toothpaste or mouthwash.  Use  CHG Soap or wipes as directed on instruction sheet.  Do not wear jewelry, make-up, hairpins, clips or nail polish.  Do not wear lotions, powders, or perfumes.   Do not shave body from the neck down 48 hours prior to surgery just in case you cut yourself which could leave a site for infection.  Also, freshly shaved skin may become irritated if using the CHG soap.  Contact lenses, hearing aids and dentures may not be worn into surgery.  Do not bring valuables to the hospital. [facility] is not responsible for any missing/lost belongings or valuables.   Notify your doctor if there is any change in your medical condition (cold, fever, infection).  Wear comfortable clothing (specific to your surgery type) to the hospital.  After surgery, you can help prevent lung complications by doing breathing exercises.  Take deep breaths and cough every 1-2 hours. Your doctor may order a device called an Incentive Spirometer to help you take deep breaths. When coughing or sneezing, hold a pillow firmly against your incision with both hands. This is called "splinting." Doing this helps protect your incision. It also decreases belly discomfort.  If you are being admitted to the hospital overnight, leave your suitcase in the car. After surgery it may be brought to your room.  If you are being discharged the day of surgery, you will not be allowed to drive home. You will need a responsible adult (18 years or older) to drive you home and stay with you that night.  If you are taking public transportation, you will need to have a responsible adult (18 years or older) with you. Please confirm with your physician that it is acceptable to use public transportation.   Please call the [place] Dept. at [phone] if you have any questions about these instructions.  Surgery Visitation Policy:  Patients undergoing a surgery or procedure may have two family members or support persons with them  as long as the person is not COVID-19 positive or experiencing its symptoms.   Inpatient Visitation:    Visiting hours are 7 a.m. to 8 p.m. Up to four visitors are allowed at one time in a patient room, including children. The visitors may rotate out with other people during the day. One designated support person (adult) may remain overnight.

## 2022-04-06 ENCOUNTER — Encounter
Admission: RE | Admit: 2022-04-06 | Discharge: 2022-04-06 | Disposition: A | Discharge status: Home / Self Care | Payer: 59 | Source: Ambulatory Visit | Attending: Obstetrics and Gynecology | Admitting: Obstetrics and Gynecology

## 2022-04-06 DIAGNOSIS — D069 Carcinoma in situ of cervix, unspecified: Secondary | ICD-10-CM | POA: Insufficient documentation

## 2022-04-06 DIAGNOSIS — Z01818 Encounter for other preprocedural examination: Secondary | ICD-10-CM

## 2022-04-06 DIAGNOSIS — Z01812 Encounter for preprocedural laboratory examination: Secondary | ICD-10-CM | POA: Insufficient documentation

## 2022-04-06 LAB — COMPREHENSIVE METABOLIC PANEL
ALT: 14 U/L (ref 0–44)
AST: 25 U/L (ref 15–41)
Albumin: 4 g/dL (ref 3.5–5.0)
Alkaline Phosphatase: 73 U/L (ref 38–126)
Anion gap: 11 (ref 5–15)
BUN: 15 mg/dL (ref 6–20)
CO2: 27 mmol/L (ref 22–32)
Calcium: 10 mg/dL (ref 8.9–10.3)
Chloride: 100 mmol/L (ref 98–111)
Creatinine, Ser: 0.81 mg/dL (ref 0.44–1.00)
GFR, Estimated: >60 mL/min (ref >60)
Glucose, Bld: 84 mg/dL (ref 70–99)
Potassium: 3.6 mmol/L (ref 3.5–5.1)
Sodium: 138 mmol/L (ref 135–145)
Total Bilirubin: 0.7 mg/dL (ref 0.3–1.2)
Total Protein: 8 g/dL (ref 6.5–8.1)

## 2022-04-08 ENCOUNTER — Ambulatory Visit: Payer: 59 | Admitting: Physical Therapy

## 2022-04-12 ENCOUNTER — Other Ambulatory Visit: Payer: Self-pay

## 2022-04-12 ENCOUNTER — Ambulatory Visit: Payer: 59 | Admitting: Urgent Care

## 2022-04-12 ENCOUNTER — Encounter: Payer: Self-pay | Admitting: Obstetrics and Gynecology

## 2022-04-12 ENCOUNTER — Observation Stay
Admission: RE | Admit: 2022-04-12 | Discharge: 2022-04-13 | Disposition: A | Discharge status: Home / Self Care | Payer: 59 | Attending: Obstetrics and Gynecology | Admitting: Obstetrics and Gynecology

## 2022-04-12 ENCOUNTER — Encounter
Admission: RE | Disposition: A | Discharge status: Home / Self Care | Payer: Self-pay | Source: Home / Self Care | Attending: Obstetrics and Gynecology

## 2022-04-12 DIAGNOSIS — D259 Leiomyoma of uterus, unspecified: Secondary | ICD-10-CM | POA: Insufficient documentation

## 2022-04-12 DIAGNOSIS — Z853 Personal history of malignant neoplasm of breast: Secondary | ICD-10-CM | POA: Diagnosis not present

## 2022-04-12 DIAGNOSIS — I10 Essential (primary) hypertension: Secondary | ICD-10-CM | POA: Diagnosis not present

## 2022-04-12 DIAGNOSIS — Z79899 Other long term (current) drug therapy: Secondary | ICD-10-CM | POA: Diagnosis not present

## 2022-04-12 DIAGNOSIS — N8003 Adenomyosis of the uterus: Secondary | ICD-10-CM | POA: Insufficient documentation

## 2022-04-12 DIAGNOSIS — N838 Other noninflammatory disorders of ovary, fallopian tube and broad ligament: Secondary | ICD-10-CM | POA: Insufficient documentation

## 2022-04-12 DIAGNOSIS — D069 Carcinoma in situ of cervix, unspecified: Principal | ICD-10-CM | POA: Insufficient documentation

## 2022-04-12 DIAGNOSIS — Z9889 Other specified postprocedural states: Secondary | ICD-10-CM

## 2022-04-12 DIAGNOSIS — N7091 Salpingitis, unspecified: Secondary | ICD-10-CM | POA: Insufficient documentation

## 2022-04-12 DIAGNOSIS — F17210 Nicotine dependence, cigarettes, uncomplicated: Secondary | ICD-10-CM | POA: Insufficient documentation

## 2022-04-12 HISTORY — DX: Other specified postprocedural states: Z98.890

## 2022-04-12 HISTORY — PX: LAPAROSCOPIC VAGINAL HYSTERECTOMY WITH SALPINGO OOPHORECTOMY: SHX6681

## 2022-04-12 LAB — TYPE AND SCREEN
ABO/RH(D): O POS
Antibody Screen: POSITIVE

## 2022-04-12 SURGERY — HYSTERECTOMY, VAGINAL, LAPAROSCOPY-ASSISTED, WITH SALPINGO-OOPHORECTOMY
Anesthesia: General | Site: Abdomen | Laterality: Bilateral

## 2022-04-12 MED ORDER — CEFAZOLIN SODIUM-DEXTROSE 2-4 GM/100ML-% IV SOLN
2.0000 g | INTRAVENOUS | Status: AC
  Administered 2022-04-12: 2 g via INTRAVENOUS

## 2022-04-12 MED ORDER — ONDANSETRON HCL 4 MG/2ML IJ SOLN: 4.0000 mg | Freq: Once | INTRAMUSCULAR | Status: DC | PRN

## 2022-04-12 MED ORDER — HYDROCODONE-ACETAMINOPHEN 5-325 MG PO TABS: 1.0000 | ORAL_TABLET | Freq: Four times a day (QID) | ORAL | 0 refills | Status: DC | PRN

## 2022-04-12 MED ORDER — IBUPROFEN 600 MG PO TABS: 600.0000 mg | ORAL_TABLET | Freq: Four times a day (QID) | ORAL | 3 refills | Status: DC | PRN

## 2022-04-12 MED ORDER — CELECOXIB 200 MG PO CAPS
ORAL_CAPSULE | ORAL | Status: AC
  Administered 2022-04-12: 400 mg via ORAL
  Filled 2022-04-12: qty 2

## 2022-04-12 MED ORDER — MIDAZOLAM HCL 2 MG/2ML IJ SOLN
INTRAMUSCULAR | Status: DC | PRN
  Administered 2022-04-12: 2 mg via INTRAVENOUS

## 2022-04-12 MED ORDER — CEFAZOLIN SODIUM-DEXTROSE 2-4 GM/100ML-% IV SOLN
INTRAVENOUS | Status: AC
  Filled 2022-04-12: qty 100

## 2022-04-12 MED ORDER — OXYCODONE-ACETAMINOPHEN 5-325 MG PO TABS: 1.0000 | ORAL_TABLET | ORAL | Status: DC | PRN

## 2022-04-12 MED ORDER — SIMETHICONE 80 MG PO CHEW: 80.0000 mg | CHEWABLE_TABLET | Freq: Four times a day (QID) | ORAL | Status: DC | PRN

## 2022-04-12 MED ORDER — DEXMEDETOMIDINE HCL IN NACL 200 MCG/50ML IV SOLN
INTRAVENOUS | Status: DC | PRN
  Administered 2022-04-12: 12 ug via INTRAVENOUS

## 2022-04-12 MED ORDER — FENTANYL CITRATE (PF) 100 MCG/2ML IJ SOLN
INTRAMUSCULAR | Status: AC
  Filled 2022-04-12: qty 2

## 2022-04-12 MED ORDER — OXYCODONE HCL 5 MG PO TABS
ORAL_TABLET | ORAL | Status: AC
  Administered 2022-04-12: 5 mg
  Filled 2022-04-12: qty 1

## 2022-04-12 MED ORDER — IPRATROPIUM-ALBUTEROL 0.5-2.5 (3) MG/3ML IN SOLN
RESPIRATORY_TRACT | Status: AC
  Filled 2022-04-12: qty 3

## 2022-04-12 MED ORDER — KETAMINE HCL 10 MG/ML IJ SOLN
INTRAMUSCULAR | Status: DC | PRN
  Administered 2022-04-12: 30 mg via INTRAVENOUS

## 2022-04-12 MED ORDER — CHLORHEXIDINE GLUCONATE 0.12 % MT SOLN
OROMUCOSAL | Status: AC
  Administered 2022-04-12: 15 mL via OROMUCOSAL
  Filled 2022-04-12: qty 15

## 2022-04-12 MED ORDER — KETOROLAC TROMETHAMINE 30 MG/ML IJ SOLN
30.0000 mg | Freq: Four times a day (QID) | INTRAMUSCULAR | Status: DC
  Administered 2022-04-12 – 2022-04-13 (×3): 30 mg via INTRAVENOUS
  Filled 2022-04-12 (×3): qty 1

## 2022-04-12 MED ORDER — ACETAMINOPHEN 500 MG PO TABS
ORAL_TABLET | ORAL | Status: AC
  Administered 2022-04-12: 1000 mg via ORAL
  Filled 2022-04-12: qty 2

## 2022-04-12 MED ORDER — BUPIVACAINE HCL 0.5 % IJ SOLN
INTRAMUSCULAR | Status: DC | PRN
  Administered 2022-04-12: 10 mL

## 2022-04-12 MED ORDER — ONDANSETRON HCL 4 MG/2ML IJ SOLN
INTRAMUSCULAR | Status: DC | PRN
  Administered 2022-04-12: 4 mg via INTRAVENOUS

## 2022-04-12 MED ORDER — KETAMINE HCL 50 MG/5ML IJ SOSY
PREFILLED_SYRINGE | INTRAMUSCULAR | Status: AC
  Filled 2022-04-12: qty 5

## 2022-04-12 MED ORDER — DEXAMETHASONE SODIUM PHOSPHATE 10 MG/ML IJ SOLN
INTRAMUSCULAR | Status: AC
  Filled 2022-04-12: qty 1

## 2022-04-12 MED ORDER — ALLOPURINOL 100 MG PO TABS
100.0000 mg | ORAL_TABLET | Freq: Every day | ORAL | Status: DC
  Administered 2022-04-12: 100 mg via ORAL
  Filled 2022-04-12: qty 1

## 2022-04-12 MED ORDER — POVIDONE-IODINE 10 % EX SWAB: 2.0000 "application " | Freq: Once | CUTANEOUS | Status: DC

## 2022-04-12 MED ORDER — KETOROLAC TROMETHAMINE 30 MG/ML IJ SOLN: 30.0000 mg | Freq: Four times a day (QID) | INTRAMUSCULAR | Status: DC

## 2022-04-12 MED ORDER — PREGABALIN 50 MG PO CAPS
100.0000 mg | ORAL_CAPSULE | Freq: Two times a day (BID) | ORAL | Status: DC
  Administered 2022-04-12 – 2022-04-13 (×2): 100 mg via ORAL
  Filled 2022-04-12 (×2): qty 2

## 2022-04-12 MED ORDER — DEXAMETHASONE SODIUM PHOSPHATE 10 MG/ML IJ SOLN
INTRAMUSCULAR | Status: DC | PRN
  Administered 2022-04-12: 10 mg via INTRAVENOUS

## 2022-04-12 MED ORDER — FENTANYL CITRATE (PF) 100 MCG/2ML IJ SOLN
INTRAMUSCULAR | Status: AC
  Administered 2022-04-12: 25 ug via INTRAVENOUS
  Filled 2022-04-12: qty 2

## 2022-04-12 MED ORDER — PROPOFOL 10 MG/ML IV BOLUS
INTRAVENOUS | Status: AC
  Filled 2022-04-12: qty 20

## 2022-04-12 MED ORDER — LIDOCAINE HCL (CARDIAC) PF 100 MG/5ML IV SOSY
PREFILLED_SYRINGE | INTRAVENOUS | Status: DC | PRN
  Administered 2022-04-12: 100 mg via INTRAVENOUS

## 2022-04-12 MED ORDER — CELECOXIB 200 MG PO CAPS: 400.0000 mg | ORAL_CAPSULE | ORAL | Status: AC

## 2022-04-12 MED ORDER — PHENYLEPHRINE HCL (PRESSORS) 10 MG/ML IV SOLN
INTRAVENOUS | Status: DC | PRN
  Administered 2022-04-12: 160 ug via INTRAVENOUS
  Administered 2022-04-12 (×3): 80 ug via INTRAVENOUS

## 2022-04-12 MED ORDER — DULOXETINE HCL 60 MG PO CPEP
60.0000 mg | ORAL_CAPSULE | Freq: Every day | ORAL | Status: DC
  Administered 2022-04-12: 60 mg via ORAL
  Filled 2022-04-12: qty 1

## 2022-04-12 MED ORDER — FENTANYL CITRATE (PF) 100 MCG/2ML IJ SOLN
INTRAMUSCULAR | Status: DC | PRN
  Administered 2022-04-12 (×2): 50 ug via INTRAVENOUS

## 2022-04-12 MED ORDER — BUPIVACAINE HCL (PF) 0.5 % IJ SOLN
INTRAMUSCULAR | Status: AC
  Filled 2022-04-12: qty 30

## 2022-04-12 MED ORDER — ACETAMINOPHEN 500 MG PO TABS: 1000.0000 mg | ORAL_TABLET | ORAL | Status: AC

## 2022-04-12 MED ORDER — SUGAMMADEX SODIUM 200 MG/2ML IV SOLN
INTRAVENOUS | Status: DC | PRN
  Administered 2022-04-12: 200 mg via INTRAVENOUS

## 2022-04-12 MED ORDER — ROCURONIUM BROMIDE 100 MG/10ML IV SOLN
INTRAVENOUS | Status: DC | PRN
  Administered 2022-04-12: 20 mg via INTRAVENOUS
  Administered 2022-04-12: 50 mg via INTRAVENOUS

## 2022-04-12 MED ORDER — ORAL CARE MOUTH RINSE: 15.0000 mL | Freq: Once | OROMUCOSAL | Status: AC

## 2022-04-12 MED ORDER — VASOPRESSIN 20 UNIT/ML IV SOLN
INTRAVENOUS | Status: DC | PRN
  Administered 2022-04-12: 15 mL via INTRAMUSCULAR

## 2022-04-12 MED ORDER — LACTATED RINGERS IV SOLN: INTRAVENOUS | Status: DC

## 2022-04-12 MED ORDER — FAMOTIDINE 20 MG PO TABS: 20.0000 mg | ORAL_TABLET | Freq: Once | ORAL | Status: AC

## 2022-04-12 MED ORDER — DEXTROSE IN LACTATED RINGERS 5 % IV SOLN: INTRAVENOUS | Status: DC

## 2022-04-12 MED ORDER — LOSARTAN POTASSIUM 50 MG PO TABS
100.0000 mg | ORAL_TABLET | Freq: Every day | ORAL | Status: DC
  Administered 2022-04-12: 100 mg via ORAL
  Filled 2022-04-12: qty 2

## 2022-04-12 MED ORDER — METOPROLOL SUCCINATE ER 25 MG PO TB24
50.0000 mg | ORAL_TABLET | Freq: Every day | ORAL | Status: DC
  Administered 2022-04-12: 50 mg via ORAL
  Filled 2022-04-12: qty 2

## 2022-04-12 MED ORDER — ONDANSETRON HCL 4 MG/2ML IJ SOLN
INTRAMUSCULAR | Status: AC
  Filled 2022-04-12: qty 2

## 2022-04-12 MED ORDER — FENTANYL CITRATE (PF) 100 MCG/2ML IJ SOLN
25.0000 ug | INTRAMUSCULAR | Status: DC | PRN
  Administered 2022-04-12 (×5): 25 ug via INTRAVENOUS

## 2022-04-12 MED ORDER — SODIUM CHLORIDE (PF) 0.9 % IJ SOLN
INTRAMUSCULAR | Status: AC
  Filled 2022-04-12: qty 50

## 2022-04-12 MED ORDER — IPRATROPIUM-ALBUTEROL 0.5-2.5 (3) MG/3ML IN SOLN
3.0000 mL | Freq: Once | RESPIRATORY_TRACT | Status: AC
  Administered 2022-04-12: 3 mL via RESPIRATORY_TRACT

## 2022-04-12 MED ORDER — AMLODIPINE BESYLATE 10 MG PO TABS
10.0000 mg | ORAL_TABLET | Freq: Every day | ORAL | Status: DC
  Administered 2022-04-12: 10 mg via ORAL
  Filled 2022-04-12: qty 1

## 2022-04-12 MED ORDER — CHLORHEXIDINE GLUCONATE 0.12 % MT SOLN: 15.0000 mL | Freq: Once | OROMUCOSAL | Status: AC

## 2022-04-12 MED ORDER — MIDAZOLAM HCL 2 MG/2ML IJ SOLN
INTRAMUSCULAR | Status: AC
  Filled 2022-04-12: qty 2

## 2022-04-12 MED ORDER — LABETALOL HCL 5 MG/ML IV SOLN
5.0000 mg | Freq: Once | INTRAVENOUS | Status: AC
  Administered 2022-04-12: 5 mg via INTRAVENOUS

## 2022-04-12 MED ORDER — 0.9 % SODIUM CHLORIDE (POUR BTL) OPTIME
TOPICAL | Status: DC | PRN
  Administered 2022-04-12: 1000 mL

## 2022-04-12 MED ORDER — PROPOFOL 10 MG/ML IV BOLUS
INTRAVENOUS | Status: DC | PRN
  Administered 2022-04-12: 50 mg via INTRAVENOUS
  Administered 2022-04-12: 150 mg via INTRAVENOUS

## 2022-04-12 MED ORDER — FAMOTIDINE 20 MG PO TABS
ORAL_TABLET | ORAL | Status: AC
  Administered 2022-04-12: 20 mg via ORAL
  Filled 2022-04-12: qty 1

## 2022-04-12 MED ORDER — VASOPRESSIN 20 UNIT/ML IV SOLN
INTRAVENOUS | Status: AC
  Filled 2022-04-12: qty 1

## 2022-04-12 MED ORDER — LABETALOL HCL 5 MG/ML IV SOLN
INTRAVENOUS | Status: AC
  Filled 2022-04-12: qty 4

## 2022-04-12 SURGICAL SUPPLY — 51 items
ADHESIVE MASTISOL STRL (MISCELLANEOUS) ×2 IMPLANT
BACTOSHIELD CHG 4% 4OZ (MISCELLANEOUS) ×1
BAG URINE DRAIN 2000ML AR STRL (UROLOGICAL SUPPLIES) ×2 IMPLANT
BLADE SURG 10 STRL SS SAFETY (BLADE) ×2 IMPLANT
BLADE SURG SZ11 CARB STEEL (BLADE) ×2 IMPLANT
CATH FOLEY 2WAY  5CC 16FR (CATHETERS) ×1
CATH URTH 16FR FL 2W BLN LF (CATHETERS) ×1 IMPLANT
CHLORAPREP W/TINT 26 (MISCELLANEOUS) ×2 IMPLANT
DERMABOND ADVANCED (GAUZE/BANDAGES/DRESSINGS) ×1
DERMABOND ADVANCED .7 DNX12 (GAUZE/BANDAGES/DRESSINGS) ×1 IMPLANT
ELECT REM PT RETURN 9FT ADLT (ELECTROSURGICAL) ×2
ELECTRODE REM PT RTRN 9FT ADLT (ELECTROSURGICAL) ×1 IMPLANT
GAUZE 4X4 16PLY ~~LOC~~+RFID DBL (SPONGE) ×4 IMPLANT
GLOVE BIOGEL PI ORTHO PRO 7.5 (GLOVE) ×1
GLOVE PI ORTHO PRO STRL 7.5 (GLOVE) ×1 IMPLANT
GOWN STRL REUS W/ TWL LRG LVL3 (GOWN DISPOSABLE) ×2 IMPLANT
GOWN STRL REUS W/ TWL XL LVL3 (GOWN DISPOSABLE) ×2 IMPLANT
GOWN STRL REUS W/TWL LRG LVL3 (GOWN DISPOSABLE) ×2
GOWN STRL REUS W/TWL XL LVL3 (GOWN DISPOSABLE) ×2
HANDLE YANKAUER SUCT BULB TIP (MISCELLANEOUS) IMPLANT
IRRIGATION STRYKERFLOW (MISCELLANEOUS) IMPLANT
IRRIGATOR STRYKERFLOW (MISCELLANEOUS)
IV LACTATED RINGERS 1000ML (IV SOLUTION) ×2 IMPLANT
KIT PINK PAD W/HEAD ARE REST (MISCELLANEOUS) ×2
KIT PINK PAD W/HEAD ARM REST (MISCELLANEOUS) ×1 IMPLANT
KIT TURNOVER CYSTO (KITS) ×2 IMPLANT
LIGASURE LAP MARYLAND 5MM 37CM (ELECTROSURGICAL) ×1 IMPLANT
MANIFOLD NEPTUNE II (INSTRUMENTS) ×2 IMPLANT
NDL SPNL 22GX3.5 QUINCKE BK (NEEDLE) ×1 IMPLANT
NEEDLE SPNL 22GX3.5 QUINCKE BK (NEEDLE) ×2 IMPLANT
PACK BASIN MINOR ARMC (MISCELLANEOUS) ×2 IMPLANT
PACK GYN LAPAROSCOPIC (MISCELLANEOUS) ×2 IMPLANT
PAD OB MATERNITY 4.3X12.25 (PERSONAL CARE ITEMS) ×2 IMPLANT
PAD PREP 24X41 OB/GYN DISP (PERSONAL CARE ITEMS) ×2 IMPLANT
PENCIL SMOKE EVACUATOR (MISCELLANEOUS) ×2 IMPLANT
RETRACTOR PHONTONGUIDE ADAPT (ADAPTER) IMPLANT
SCRUB CHG 4% DYNA-HEX 4OZ (MISCELLANEOUS) ×1 IMPLANT
SLEEVE ENDOPATH XCEL 5M (ENDOMECHANICALS) ×4 IMPLANT
SOLUTION ELECTROLUBE (MISCELLANEOUS) IMPLANT
SPONGE T-LAP 18X18 ~~LOC~~+RFID (SPONGE) ×2 IMPLANT
STRIP CLOSURE SKIN 1/2X4 (GAUZE/BANDAGES/DRESSINGS) IMPLANT
SUT VIC AB 0 CT1 27 (SUTURE) ×2
SUT VIC AB 0 CT1 27XCR 8 STRN (SUTURE) ×2 IMPLANT
SUT VIC AB 0 CT1 36 (SUTURE) ×4 IMPLANT
SUT VIC AB 4-0 FS2 27 (SUTURE) ×2 IMPLANT
SYR 10ML LL (SYRINGE) ×2 IMPLANT
SYR CONTROL 10ML LL (SYRINGE) ×2 IMPLANT
TAPE TRANSPORE STRL 2 31045 (GAUZE/BANDAGES/DRESSINGS) ×2 IMPLANT
TROCAR XCEL NON-BLD 5MMX100MML (ENDOMECHANICALS) ×2 IMPLANT
TUBING EVAC SMOKE HEATED PNEUM (TUBING) ×2 IMPLANT
WATER STERILE IRR 500ML POUR (IV SOLUTION) ×2 IMPLANT

## 2022-04-12 NOTE — Anesthesia Postprocedure Evaluation (Signed)
Anesthesia Post Note  Patient: Michelle Walker  Procedure(s) Performed: LAPAROSCOPIC ASSISTED VAGINAL HYSTERECTOMY WITH SALPINGO OOPHORECTOMY (Bilateral: Abdomen)  Anesthesia Type: General Level of consciousness: lethargic Anesthetic complications: no   No notable events documented.   Last Vitals:  Vitals:   04/12/22 0800  BP: 131/80  Pulse: 83  Resp: 16  Temp: 36.8 C  SpO2: 99%    Last Pain:  Vitals:   04/12/22 0800  TempSrc: Oral  PainSc: 7                  Kerri Perches

## 2022-04-12 NOTE — Transfer of Care (Addendum)
Immediate Anesthesia Transfer of Care Note  Patient: Michelle Walker  Procedure(s) Performed: LAPAROSCOPIC ASSISTED VAGINAL HYSTERECTOMY WITH SALPINGO OOPHORECTOMY (Bilateral: Abdomen)  Patient Location: PACU  Anesthesia Type:gen  Level of Consciousness: awake  Airway & Oxygen Therapy: Patient Spontanous Breathing  Post-op Assessment: Report given to RN  Post vital signs: stable  Last Vitals:  Vitals Value Taken Time  BP    Temp    Pulse    Resp    SpO2      Last Pain:  Vitals:   04/12/22 0800  TempSrc: Oral  PainSc: 7          Complications: No notable events documented.

## 2022-04-12 NOTE — Anesthesia Procedure Notes (Signed)
Procedure Name: Intubation Date/Time: 04/12/2022 10:48 AM  Performed by: Kerri Perches, CRNAPre-anesthesia Checklist: Patient identified, Patient being monitored, Timeout performed, Emergency Drugs available and Suction available Patient Re-evaluated:Patient Re-evaluated prior to induction Oxygen Delivery Method: Circle system utilized Preoxygenation: Pre-oxygenation with 100% oxygen Induction Type: IV induction Ventilation: Mask ventilation without difficulty Laryngoscope Size: 3 and McGraph Grade View: Grade I Tube type: Oral Tube size: 7.0 mm Number of attempts: 1 Airway Equipment and Method: Stylet Placement Confirmation: ETT inserted through vocal cords under direct vision, positive ETCO2 and breath sounds checked- equal and bilateral Secured at: 21 cm Tube secured with: Tape Dental Injury: Teeth and Oropharynx as per pre-operative assessment

## 2022-04-12 NOTE — Discharge Instructions (Signed)
AMBULATORY SURGERY  ?DISCHARGE INSTRUCTIONS ? ? ?The drugs that you were given will stay in your system until tomorrow so for the next 24 hours you should not: ? ?Drive an automobile ?Make any legal decisions ?Drink any alcoholic beverage ? ? ?You may resume regular meals tomorrow.  Today it is better to start with liquids and gradually work up to solid foods. ? ?You may eat anything you prefer, but it is better to start with liquids, then soup and crackers, and gradually work up to solid foods. ? ? ?Please notify your doctor immediately if you have any unusual bleeding, trouble breathing, redness and pain at the surgery site, drainage, fever, or pain not relieved by medication. ? ? ? ?Additional Instructions: ? ? ? ?Please contact your physician with any problems or Same Day Surgery at [phone], Monday through Friday 6 am to 4 pm, or [place] at [place] Main number at [phone].  ?

## 2022-04-12 NOTE — Anesthesia Preprocedure Evaluation (Signed)
Anesthesia Evaluation  Patient identified by MRN, date of birth, ID band Patient awake    Reviewed: Allergy & Precautions, NPO status , Patient's Chart, lab work & pertinent test results  Airway Mallampati: II  TM Distance: >3 FB Neck ROM: Full    Dental  (+) Teeth Intact, Dental Advisory Given   Pulmonary neg pulmonary ROS, COPD, Current Smoker and Patient abstained from smoking.,    Pulmonary exam normal  + decreased breath sounds      Cardiovascular Exercise Tolerance: Good hypertension, Pt. 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               Wide excision/ RT, T1a,N0. ER/PR pos, Her 2.neg.               Tamoxifen x 6 months, d/c secondary to vasomotor               sympotms.  No date: Headache No date: Hypertension No date: Peripheral neuropathy 2016: Personal history of radiation therapy     Comment:  RIGHT lumpectomy No date: Thyroid disease  Past Surgical History: No date: ANTERIOR CRUCIATE LIGAMENT REPAIR; Right 07/17/2015: BREAST BIOPSY; Right     Comment:  INVASIVE LOBULAR CARCINOMA, CLASSIC TYPE (1.5 MM),               ARISING IN A  09/19/2020: BREAST BIOPSY; Left     Comment:  [place] bx 3 areas/ 1oc q clip/ 12 oc vision clip, axilla               lymph node hydromark shape 3/ path  pending 08/22/2015: BREAST LUMPECTOMY; Right 08/22/2015: BREAST LUMPECTOMY WITH SENTINEL LYMPH NODE BIOPSY; Right     Comment:  Procedure: BREAST LUMPECTOMY WITH SENTINEL LYMPH NODE               BX;  Surgeon: Christene Lye, MD;  Location: [facility]               ORS;  Service: General;  Laterality: Right; 2014: COLONOSCOPY W/ POLYPECTOMY 04/16/2019: COLONOSCOPY WITH PROPOFOL; N/A     Comment:  Procedure: COLONOSCOPY WITH BIOPSIES;  Surgeon: Lucilla Lame, MD;  Location: [place];  Service:               Endoscopy;  Laterality: N/A; No date: DILATION AND CURETTAGE OF UTERUS     Comment:  20 years ago No date: MASTECTOMY; Left     Comment:  2021 No date: NOVASURE ABLATION 04/16/2019: POLYPECTOMY; N/A     Comment:  Procedure: POLYPECTOMY;  Surgeon: Lucilla Lame, MD;                Location: [place] [facility];  Service: Endoscopy;                Laterality:  N/A; 12/01/2020: PORTACATH PLACEMENT; Right     Comment:  Procedure: INSERTION PORT-A-CATH;  Surgeon: [name], MD;  Location: [facility];  Service: General;                Laterality: Right; 05/06/2021: TOTAL MASTECTOMY; Left     Comment:  Procedure: modified radical mastectomy;  Surgeon:               [name], MD;  Location: [facility];  Service:               General;  Laterality: Left;  Provider requesting 1.5               hours / 90 minutes for procedure  BMI    Body Mass Index: 30.45 kg/m      Reproductive/Obstetrics negative OB ROS                             Anesthesia Physical Anesthesia Plan  ASA: 3  Anesthesia Plan: General   Post-op Pain Management:    Induction: Intravenous  PONV Risk Score and Plan: 1 and Ondansetron and Dexamethasone  Airway Management Planned: Oral ETT  Additional Equipment:   Intra-op Plan:   Post-operative Plan: Extubation in OR  Informed Consent: I have reviewed the patients History and  Physical, chart, labs and discussed the procedure including the risks, benefits and alternatives for the proposed anesthesia with the patient or authorized representative who has indicated his/her understanding and acceptance.     Dental Advisory Given  Plan Discussed with: CRNA and Surgeon  Anesthesia Plan Comments:         Anesthesia Quick Evaluation

## 2022-04-12 NOTE — Interval H&P Note (Signed)
History and Physical Interval Note:  04/12/2022 9:28 AM  Michelle Walker  has presented today for surgery, with the diagnosis of CIN-3 , Hx of breast cancer.  The various methods of treatment have been discussed with the patient and family. After consideration of risks, benefits and other options for treatment, the patient has consented to  Procedure(s): [place] (N/A) as a surgical intervention.  The patient's history has been reviewed, patient examined, no change in status, stable for surgery.  I have reviewed the patient's chart and labs.  Questions were answered to the patient's satisfaction.     Jeannie Fend

## 2022-04-12 NOTE — Op Note (Signed)
OPERATIVE NOTE 04/12/2022 12:30 PM  PRE-OPERATIVE DIAGNOSIS:  1) CIN-3 , Hx of breast cancer  POST-OPERATIVE DIAGNOSIS:  * No Diagnosis Codes entered *  OPERATION: Procedure(s) (LRB): LAPAROSCOPIC ASSISTED VAGINAL HYSTERECTOMY WITH SALPINGO OOPHORECTOMY (Bilateral)    SURGEON(S): Surgeon(s) and Role:    Harlin Heys, MD - Primary   ANESTHESIA: General  ESTIMATED BLOOD LOSS: 30 mL  OPERATIVE FINDINGS:   SPECIMEN:  ID Type Source Tests Collected by Time Destination  1 : Uterus, Cervix, Bilateral Fallopian Tubes and Ovaries Tissue PATH Gyn benign resection SURGICAL PATHOLOGY Harlin Heys, MD 02/25/8340 9622     COMPLICATIONS: None  DRAINS: Foley to gravity  DISPOSITION: Stable to recovery room  DESCRIPTION OF PROCEDURE:      The patient was prepped and draped in the dorsolithotomy position and placed under general anesthesia. The bladder was emptied. The cervix was grasped with a multi-toothed tenaculum and a uterine manipulator was placed within the cervical os respecting the position and curvature of the uterus. After changing gloves we proceeded abdominally. A small infraumbilical incision was made and a 5 mm trocar port was placed within the abdominopelvic cavity. The opening pressure was less than 7 mmHg.  Approximately 3 and 1/2 L of carbon dioxide gas was instilled within the abdominal pelvic cavity. The laparoscope was placed and the pelvis and abdomen were carefully inspected. In the usual manner, under direct visualization right and left lower quadrant ports of 5 mm size were placed. Both ureters were identified in the pelvis prior to dissection or clamping and cutting of pedicles. A window was made in the broad ligament and the infundibulopelvic ligaments were carefully identified. The ligaments were clamped divided and doubly suture ligated. Hemostasis of the pedicles was noted.   The fallopian tubes were elevated and the mesenteric side  systematically coagulated and divided allowing the tube   to be removed at the time of uterine removal. The round ligaments were coagulated and divided and a bladder flap was created. The upper aspect of the broad ligament was clamped coagulated and divided. The uterine arteries were skeletonized, triply coagulated and divided. Careful inspection of all pedicles and the remainder of the pelvis was performed. Hemostasis was noted. The lower quadrant ports were removed, hemostasis of the port sites was noted, and the incisions were closed in subcuticular manner. The laparoscope and trocar sleeve were removed from the infraumbilical incision, hemostasis was noted, and the incision was closed in a subcuticular manner. Dermabond applied. A long-acting anesthetic was employed in the skin incisions. We then proceeded vaginally. A weighted speculum was placed posteriorly. A multi-toothed tenaculum was used to grasp the cervix and the cervix was injected in a circumferential manner with a dilute Pitressin solution. An incision was made around the cervix and the vaginal mucosa was dissected off of the cervix. The posterior cul-de-sac was identified and entered and the weighted speculum was placed within this. The anterior cul-de-sac was identified and entered and a retractor was placed and used to retract the bladder anteriorly keeping it out of the operative field. The uterosacral ligaments were clamped divided and suture ligated. The cardinal ligaments were clamped divided and suture ligated. The small remaining pedicle was clamped divided and suture ligated bilaterally allowing delivery of the specimen. Angle sutures were placed in the usual manner. A culdoplasty was performed. The peritoneum was identified anteriorly and then incorporating the left upper pedicle left lower pedicle right lower pedicle right upper pedicle and anterior peritoneum  a pursestring suture was placed exteriorizing all pedicles. Hemostasis of  all pedicles was noted at this time. The vaginal mucosa was then closed with a running suture of Vicryl.  The patient went to recovery room in stable condition.  Finis Bud, M.D. 04/12/2022 12:30 PM

## 2022-04-13 ENCOUNTER — Encounter: Payer: Self-pay | Admitting: Obstetrics and Gynecology

## 2022-04-13 DIAGNOSIS — D069 Carcinoma in situ of cervix, unspecified: Secondary | ICD-10-CM | POA: Diagnosis not present

## 2022-04-13 MED ORDER — IBUPROFEN 600 MG PO TABS: 600.0000 mg | ORAL_TABLET | Freq: Four times a day (QID) | ORAL | 3 refills | Status: DC | PRN

## 2022-04-13 NOTE — Discharge Summary (Signed)
Discharge Summary  Admit date: 04/12/2022  Discharge Date and Time:04/13/2022  12:15 PM  Discharge to:  Home  Admission Diagnosis:  Unable to maintain oxygen saturation on room air                    Discharge  Diagnoses: Principal Problem:   Post-operative state Above issue resolved.  OR Procedures:   Procedure(s): LAPAROSCOPIC ASSISTED VAGINAL HYSTERECTOMY WITH SALPINGO OOPHORECTOMY Date -------------------                              Discharge Day Progress Note:   Subjective:   The patient does not have complaints.  She is ambulating well. She is taking PO well. Her pain is well controlled with her current medications. She is urinating without difficulty and is passing flatus.   Objective:  BP 125/78 (BP Location: Right Arm)   Pulse 100   Temp 98 F (36.7 C)   Resp 18   Ht '5\' 5"'$  (1.651 m)   Wt 83 kg   SpO2 98%   BMI 30.45 kg/m     Abdomen:                         clean, dry, no drainage   Oxygen saturations are now maintained above 90%.    Assessment:   Doing well.  Normal progress as expected.   Patient now able to ambulate sit in the chair and perform normal activities without desatting into the 80s.  Plan:        Discharge home.                       Medications as directed.  Hospital Course:  No notes on file   Condition at Discharge:  good Discharge Medications:  Allergies as of 04/13/2022       Reactions   Triamcinolone Other (See Comments)   Hypopigmentation s/p steroid injection   Tape Rash   SURGICAL TAPE AFTER BREAST BIOPSY        Medication List     STOP taking these medications    metroNIDAZOLE 500 MG tablet Commonly known as: FLAGYL       TAKE these medications    allopurinol 100 MG tablet Commonly known as: ZYLOPRIM Take 100 mg by mouth at bedtime.   amLODipine 10 MG tablet Commonly known as: NORVASC Take 20 mg by mouth at bedtime.   CALCIUM 600+D3 PO Take 1 tablet by mouth at bedtime.   DULoxetine 60 MG  capsule Commonly known as: CYMBALTA Take 1 capsule (60 mg total) by mouth daily.   hydrochlorothiazide 25 MG tablet Commonly known as: HYDRODIURIL Take 25 mg by mouth daily.   HYDROcodone-acetaminophen 5-325 MG tablet Commonly known as: NORCO/VICODIN Take 1-2 tablets by mouth every 6 (six) hours as needed for moderate pain.   ibuprofen 600 MG tablet Commonly known as: ADVIL Take 1 tablet (600 mg total) by mouth every 6 (six) hours as needed.   ibuprofen 600 MG tablet Commonly known as: ADVIL Take 1 tablet (600 mg total) by mouth every 6 (six) hours as needed.   letrozole 2.5 MG tablet Commonly known as: FEMARA TAKE 1 TABLET(2.5 MG) BY MOUTH DAILY(START AFTER RADIATION IS OVER)   losartan 100 MG tablet Commonly known as: COZAAR Take 1 tablet by mouth daily.   metoprolol succinate 25 MG 24 hr tablet Commonly known  as: TOPROL-XL Take 50 mg by mouth at bedtime.   potassium chloride SA 20 MEQ tablet Commonly known as: KLOR-CON M Take 1 tablet (20 mEq total) by mouth daily.   pregabalin 75 MG capsule Commonly known as: Lyrica Take 2 capsules (150 mg total) by mouth 2 (two) times daily.               Discharge Care Instructions  (From admission, onward)           Start     Ordered   04/13/22 0000  No dressing needed       Comments: Keep wound area clean and dry as directed   04/13/22 1213   04/12/22 0000  No dressing needed       Comments: Keep wound area clean and dry as directed   04/12/22 1236             Follow Up:    Follow-up Information     Harlin Heys, MD Follow up in 1 week(s).   Specialties: Obstetrics and Gynecology, Radiology Why: May do video visit if desired. Contact information: [address] [place] [address] [phone]                 Finis Bud, M.D. 04/13/2022 12:15 PM

## 2022-04-13 NOTE — Progress Notes (Signed)
Patient discharged home with family.  Discharge instructions, when to follow up, and prescriptions reviewed with patient.  Patient verbalized understanding. Patient will be escorted out by auxiliary.   

## 2022-04-14 LAB — SURGICAL PATHOLOGY

## 2022-04-20 ENCOUNTER — Encounter: Payer: Self-pay | Admitting: Obstetrics and Gynecology

## 2022-04-20 ENCOUNTER — Ambulatory Visit (INDEPENDENT_AMBULATORY_CARE_PROVIDER_SITE_OTHER): Payer: 59 | Admitting: Obstetrics and Gynecology

## 2022-04-20 VITALS — BP 124/81 | HR 111 | Ht 65.0 in | Wt 184.2 lb

## 2022-04-20 DIAGNOSIS — R309 Painful micturition, unspecified: Secondary | ICD-10-CM | POA: Diagnosis not present

## 2022-04-20 DIAGNOSIS — Z9889 Other specified postprocedural states: Secondary | ICD-10-CM

## 2022-04-20 LAB — POCT URINALYSIS DIPSTICK
Bilirubin, UA: NEGATIVE
Blood, UA: NEGATIVE
Glucose, UA: NEGATIVE
Ketones, UA: NEGATIVE
Leukocytes, UA: NEGATIVE
Nitrite, UA: NEGATIVE
Protein, UA: NEGATIVE
Spec Grav, UA: >=1.03 — AB (ref 1.010–1.025)
Urobilinogen, UA: 0.2 E.U./dL
pH, UA: 6 (ref 5.0–8.0)

## 2022-04-20 NOTE — Progress Notes (Signed)
Patient presents today for 2 week post-op follow-up post hysterectomy. She states no pain or bleeding at this time. Patient states occasional pressure with urination. No other questions or concerns at this time.

## 2022-04-20 NOTE — Therapy (Signed)
.  oprcpthy

## 2022-04-20 NOTE — Progress Notes (Signed)
HPI:      [name] is a 61 y.o. [id number] who LMP was Patient's last menstrual period was 01/05/2017.  Subjective:   She presents today 2 weeks postop from [facility] for CIN 3.  (Confirmed that surgical pathology). She stayed overnight in the hospital because she had decreased oxygen saturations.  They improved the next day and she was able to go home.  She reports that she has been doing fine at home.  She is not having any bleeding or problems with urination or bowel movements.  Not having any pain.  She is requesting to go back to work July 10.    Hx: The following portions of the patient's history were reviewed and updated as appropriate:             She  has a past medical history of Anemia, Arthritis, Breast cancer ([place]) (08/2015), Headache, Hypertension, Peripheral neuropathy, Personal history of radiation therapy (2016), and Thyroid disease. She does not have any pertinent problems on file. She  has a past surgical history that includes Colonoscopy w/ polypectomy (2014); Anterior cruciate ligament repair (Right); Novasure ablation; Breast lumpectomy with sentinel lymph node bx (Right, 08/22/2015); Dilation and curettage of uterus; Colonoscopy with propofol (N/A, 04/16/2019); polypectomy (N/A, 04/16/2019); Breast lumpectomy (Right, 08/22/2015); Breast biopsy (Right, 07/17/2015); Breast biopsy (Left, 09/19/2020); Portacath placement (Right, 12/01/2020); Total mastectomy (Left, 05/06/2021); Mastectomy (Left); and Laparoscopic vaginal hysterectomy with salpingo oophorectomy (Bilateral, 04/12/2022). Her family history includes Cancer in her sister; Diabetes in her brother and sister; Stroke in her mother and sister. She  reports that she has been smoking cigarettes. She has a 30.00 pack-year smoking history. She has never used smokeless tobacco. She reports current alcohol use of about 2.0 standard drinks of alcohol per week. She reports that she does not use drugs. She has a current medication  list which includes the following prescription(s): allopurinol, amlodipine, calcium carb-cholecalciferol, duloxetine, hydrochlorothiazide, ibuprofen, ibuprofen, letrozole, losartan, metoprolol succinate, potassium chloride sa, and pregabalin, and the following Facility-Administered Medications: sodium chloride flush. She is allergic to triamcinolone and tape.       Review of Systems:  Review of Systems  Constitutional: Denied constitutional symptoms, night sweats, recent illness, fatigue, fever, insomnia and weight loss.  Eyes: Denied eye symptoms, eye pain, photophobia, vision change and visual disturbance.  Ears/Nose/Throat/Neck: Denied ear, nose, throat or neck symptoms, hearing loss, nasal discharge, sinus congestion and sore throat.  Cardiovascular: Denied cardiovascular symptoms, arrhythmia, chest pain/pressure, edema, exercise intolerance, orthopnea and palpitations.  Respiratory: Denied pulmonary symptoms, asthma, pleuritic pain, productive sputum, cough, dyspnea and wheezing.  Gastrointestinal: Denied, gastro-esophageal reflux, melena, nausea and vomiting.  Genitourinary: Denied genitourinary symptoms including symptomatic vaginal discharge, pelvic relaxation issues, and urinary complaints.  Musculoskeletal: Denied musculoskeletal symptoms, stiffness, swelling, muscle weakness and myalgia.  Dermatologic: Denied dermatology symptoms, rash and scar.  Neurologic: Denied neurology symptoms, dizziness, headache, neck pain and syncope.  Psychiatric: Denied psychiatric symptoms, anxiety and depression.  Endocrine: Denied endocrine symptoms including hot flashes and night sweats.   Meds:   Current Outpatient Medications on File Prior to Visit  Medication Sig Dispense Refill   allopurinol (ZYLOPRIM) 100 MG tablet Take 100 mg by mouth at bedtime.     amLODipine (NORVASC) 10 MG tablet Take 20 mg by mouth at bedtime.  0   Calcium Carb-Cholecalciferol (CALCIUM 600+D3 PO) Take 1 tablet by mouth  at bedtime.     DULoxetine (CYMBALTA) 60 MG capsule Take 1 capsule (60 mg total) by mouth daily. [facility]  capsule 1   hydrochlorothiazide (HYDRODIURIL) 25 MG tablet Take 25 mg by mouth daily.     ibuprofen (ADVIL) 600 MG tablet Take 1 tablet (600 mg total) by mouth every 6 (six) hours as needed. 60 tablet 3   ibuprofen (ADVIL) 600 MG tablet Take 1 tablet (600 mg total) by mouth every 6 (six) hours as needed. 60 tablet 3   letrozole (FEMARA) 2.5 MG tablet TAKE 1 TABLET(2.5 MG) BY MOUTH DAILY(START AFTER RADIATION IS OVER) 30 tablet 3   losartan (COZAAR) 100 MG tablet Take 1 tablet by mouth daily.     metoprolol succinate (TOPROL-XL) 25 MG 24 hr tablet Take 50 mg by mouth at bedtime.  0   potassium chloride SA (KLOR-CON M) 20 MEQ tablet Take 1 tablet (20 mEq total) by mouth daily. 14 tablet 0   pregabalin (LYRICA) 75 MG capsule Take 2 capsules (150 mg total) by mouth 2 (two) times daily. 60 capsule 3   Current Facility-Administered Medications on File Prior to Visit  Medication Dose Route Frequency Provider Last Rate Last Admin   sodium chloride flush (NS) 0.9 % injection 10 mL  10 mL Intravenous PRN Sindy Guadeloupe, MD   10 mL at 03/27/21 0813      Objective:     Vitals:   04/20/22 0808  BP: 124/81  Pulse: (!) 111   Filed Weights   04/20/22 0808  Weight: 184 lb 3.2 oz (83.6 kg)                        Assessment:    [id number] Patient Active Problem List   Diagnosis Date Noted   Post-operative state 04/12/2022   Chemotherapy-induced neuropathy ([place]) 04/02/2022   Trochanteric bursitis of right hip 09/10/2021   Pain in joint of right hip 09/10/2021   S/P mastectomy, left 05/12/2021   Breast cancer metastasized to axillary lymph node, right ([place]) 05/06/2021   Prophylaxis for chemotherapy-induced neutropenia 01/16/2021   Genetic testing 11/26/2020   Goals of care, counseling/discussion 11/04/2020   Breast cancer, left ([place]) 11/04/2020   History of colonic polyps    Polyp of  ascending colon    Depression 09/20/2017   DM2 (diabetes mellitus, type 2) ([place]) 09/20/2017   Gout 09/20/2017   HTN (hypertension) 09/20/2017   Migraine 09/20/2017   History of endometrial ablation 02/26/2016   Malignant neoplasm of upper-outer quadrant of left breast in female, estrogen receptor positive ([place]) 08/04/2015     1. Post-operative state   2. Pain with urination     Pathology revealed CIN-3.  Patient doing well.  Occasional pressure symptoms with urination-not really pain.   Plan:            1.  UA today consider antibiotics if necessary  2.  Patient may return to work July 10 light duty.  3.  Discussed lung function and tobacco use.  Strongly advised patient to discontinue.  She states that she has not yet had the desire to quit.   Orders Orders Placed This Encounter  Procedures   POCT urinalysis dipstick    No orders of the defined types were placed in this encounter.     F/U  Return in about 4 weeks (around 05/18/2022).  Finis Bud, M.D. 04/20/2022 8:33 AM

## 2022-04-22 ENCOUNTER — Encounter: Payer: 59 | Admitting: Physical Therapy

## 2022-04-26 ENCOUNTER — Telehealth: Payer: Self-pay | Admitting: Obstetrics and Gynecology

## 2022-05-03 ENCOUNTER — Encounter: Payer: 59 | Admitting: Physical Therapy

## 2022-05-08 ENCOUNTER — Other Ambulatory Visit: Payer: Self-pay

## 2022-05-08 ENCOUNTER — Emergency Department
Admission: EM | Admit: 2022-05-08 | Discharge: 2022-05-08 | Disposition: A | Discharge status: Home / Self Care | Payer: 59 | Attending: Emergency Medicine | Admitting: Emergency Medicine

## 2022-05-08 DIAGNOSIS — I10 Essential (primary) hypertension: Secondary | ICD-10-CM | POA: Diagnosis not present

## 2022-05-08 DIAGNOSIS — R55 Syncope and collapse: Secondary | ICD-10-CM | POA: Insufficient documentation

## 2022-05-08 DIAGNOSIS — E119 Type 2 diabetes mellitus without complications: Secondary | ICD-10-CM | POA: Diagnosis not present

## 2022-05-08 DIAGNOSIS — Z853 Personal history of malignant neoplasm of breast: Secondary | ICD-10-CM | POA: Diagnosis not present

## 2022-05-08 DIAGNOSIS — E876 Hypokalemia: Secondary | ICD-10-CM | POA: Insufficient documentation

## 2022-05-08 LAB — URINALYSIS, ROUTINE W REFLEX MICROSCOPIC
Bacteria, UA: NONE SEEN
Bilirubin Urine: NEGATIVE
Glucose, UA: NEGATIVE mg/dL
Hgb urine dipstick: NEGATIVE
Ketones, ur: NEGATIVE mg/dL
Nitrite: NEGATIVE
Protein, ur: NEGATIVE mg/dL
Specific Gravity, Urine: 1.013 (ref 1.005–1.030)
pH: 6 (ref 5.0–8.0)

## 2022-05-08 LAB — BASIC METABOLIC PANEL
Anion gap: 14 (ref 5–15)
Anion gap: 9 (ref 5–15)
BUN: 12 mg/dL (ref 6–20)
BUN: 11 mg/dL (ref 6–20)
CO2: 22 mmol/L (ref 22–32)
CO2: 23 mmol/L (ref 22–32)
Calcium: 8.8 mg/dL — ABNORMAL LOW (ref 8.9–10.3)
Calcium: 8.9 mg/dL (ref 8.9–10.3)
Chloride: 97 mmol/L — ABNORMAL LOW (ref 98–111)
Chloride: 107 mmol/L (ref 98–111)
Creatinine, Ser: 0.64 mg/dL (ref 0.44–1.00)
Creatinine, Ser: 0.55 mg/dL (ref 0.44–1.00)
GFR, Estimated: >60 mL/min (ref >60)
GFR, Estimated: >60 mL/min (ref >60)
Glucose, Bld: 86 mg/dL (ref 70–99)
Glucose, Bld: 116 mg/dL — ABNORMAL HIGH (ref 70–99)
Potassium: 2.3 mmol/L — CL (ref 3.5–5.1)
Potassium: 3.4 mmol/L — ABNORMAL LOW (ref 3.5–5.1)
Sodium: 133 mmol/L — ABNORMAL LOW (ref 135–145)
Sodium: 139 mmol/L (ref 135–145)

## 2022-05-08 LAB — CBC
HCT: 38.5 % (ref 36.0–46.0)
Hemoglobin: 12.4 g/dL (ref 12.0–15.0)
MCH: 27.3 pg (ref 26.0–34.0)
MCHC: 32.2 g/dL (ref 30.0–36.0)
MCV: 84.6 fL (ref 80.0–100.0)
Platelets: 188 10*3/uL (ref 150–400)
RBC: 4.55 MIL/uL (ref 3.87–5.11)
RDW: 13.1 % (ref 11.5–15.5)
WBC: 7.2 10*3/uL (ref 4.0–10.5)
nRBC: 0 % (ref 0.0–0.2)

## 2022-05-08 LAB — MAGNESIUM: Magnesium: 2.1 mg/dL (ref 1.7–2.4)

## 2022-05-08 LAB — ETHANOL: Alcohol, Ethyl (B): 86 mg/dL — ABNORMAL HIGH (ref <10)

## 2022-05-08 MED ORDER — SODIUM CHLORIDE 0.9 % IV BOLUS
1000.0000 mL | Freq: Once | INTRAVENOUS | Status: AC
  Administered 2022-05-08: 1000 mL via INTRAVENOUS

## 2022-05-08 MED ORDER — THIAMINE HCL 100 MG/ML IJ SOLN
100.0000 mg | Freq: Once | INTRAMUSCULAR | Status: AC
  Administered 2022-05-08: 100 mg via INTRAVENOUS
  Filled 2022-05-08: qty 2

## 2022-05-08 MED ORDER — POTASSIUM CHLORIDE CRYS ER 20 MEQ PO TBCR
60.0000 meq | EXTENDED_RELEASE_TABLET | Freq: Once | ORAL | Status: AC
Start: 2022-05-08 — End: 2022-05-08
  Administered 2022-05-08: 60 meq via ORAL
  Filled 2022-05-08: qty 3

## 2022-05-08 MED ORDER — POTASSIUM CHLORIDE 10 MEQ/100ML IV SOLN
10.0000 meq | INTRAVENOUS | Status: AC
  Administered 2022-05-08 (×3): 10 meq via INTRAVENOUS
  Filled 2022-05-08 (×3): qty 100

## 2022-05-08 MED ORDER — FOLIC ACID 5 MG/ML IJ SOLN
1.0000 mg | Freq: Every day | INTRAMUSCULAR | Status: DC
  Administered 2022-05-08: 1 mg via INTRAVENOUS
  Filled 2022-05-08: qty 0.2

## 2022-05-08 NOTE — Discharge Instructions (Addendum)
Your blood work showed that your potassium was low today.  It returned to a near normal level after repletion with intravenous and oral potassium.  Please make sure you are eating fruits and vegetables high in potassium such as those listed in the pamphlet attached.  Please follow-up with your primary care doctor to have this rechecked in about 1 week to make sure it has not dropped again.

## 2022-05-08 NOTE — ED Provider Notes (Signed)
[facility] Provider Note    Event Date/Time   First MD Initiated Contact with Patient 05/08/22 1714     (approximate)   History   Loss of Consciousness   HPI  Michelle Walker is a 61 y.o. female past medical history of breast cancer, hypertension hyperlipidemia and daily alcohol use who presents after syncopal episode.  Patient was getting her hair curled in the house notes that it was very hot she felt her whole body getting hot and then apparently had a syncopal episode.  She was sitting down at the time did not fall or hit her head.  When EMS got there she was hypotensive when they transitioned her to the stretcher she passed out again.  Patient notes that she feels much better after being out of the hot house.  She was drinking about 4-5 beers today typically drinks about 2 to 3/day.  Did have some water today but otherwise nothing to eat.  Denies any preceding chest pain shortness of breath palpitations denies nausea vomiting abdominal pain did have a hysterectomy last month on June 12.  Denies lower extremity edema.    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 Wide excision/ RT, T1a,N0. ER/PR pos, Her 2.neg. Tamoxifen x 6 months, d/c secondary to vasomotor sympotms.    Headache    Hypertension    Peripheral neuropathy    Personal history of radiation therapy 2016   RIGHT lumpectomy   Thyroid disease     Patient Active Problem List   Diagnosis Date Noted   Post-operative state 04/12/2022   Chemotherapy-induced neuropathy ([place]) 04/02/2022   Trochanteric bursitis of right hip 09/10/2021   Pain in joint of right hip 09/10/2021   S/P mastectomy, left 05/12/2021   Breast cancer metastasized to axillary lymph node, right ([place]) 05/06/2021   Prophylaxis for chemotherapy-induced neutropenia 01/16/2021   Genetic testing 11/26/2020   Goals of care,  counseling/discussion 11/04/2020   Breast cancer, left ([place]) 11/04/2020   History of colonic polyps    Polyp of ascending colon    Depression 09/20/2017   DM2 (diabetes mellitus, type 2) ([place]) 09/20/2017   Gout 09/20/2017   HTN (hypertension) 09/20/2017   Migraine 09/20/2017   History of endometrial ablation 02/26/2016   Malignant neoplasm of upper-outer quadrant of left breast in female, estrogen receptor positive ([place]) 08/04/2015     Physical Exam  Triage Vital Signs: ED Triage Vitals  Enc Vitals Group     BP 05/08/22 1658 105/68     Pulse Rate 05/08/22 1658 99     Resp 05/08/22 1658 18     Temp 05/08/22 1658 97.8 F (36.6 C)     Temp Source 05/08/22 1658 Oral     SpO2 05/08/22 1658 98 %     Weight 05/08/22 1700 186 lb (84.4 kg)     Height 05/08/22 1700 '5\' 5"'$  (1.651 m)     Head Circumference --      Peak Flow --      Pain Score 05/08/22 1700 0     Pain Loc --      Pain Edu? --      Excl. in [place]? --     Most recent vital signs: Vitals:   05/08/22 1900 05/08/22 2000  BP: 135/88 (!) 130/93  Pulse: 96 91  Resp: (!) 21 20  Temp:  SpO2: 90% 91%     General: Awake, no distress.  CV:  Good peripheral perfusion.  No edema or leg asymmetry Resp:  Normal effort.  No increased work of breathing Abd:  No distention.  Soft nontender Neuro:             Awake, Alert, Oriented x 3  Other:  Dry mucous membrane   ED Results / Procedures / Treatments  Labs (all labs ordered are listed, but only abnormal results are displayed) Labs Reviewed  BASIC METABOLIC PANEL - Abnormal; Notable for the following components:      Result Value   Sodium 133 (*)    Potassium 2.3 (*)    Chloride 97 (*)    Calcium 8.8 (*)    All other components within normal limits  URINALYSIS, ROUTINE W REFLEX MICROSCOPIC - Abnormal; Notable for the following components:   Color, Urine YELLOW (*)    APPearance HAZY (*)    Leukocytes,Ua SMALL (*)    All other components within normal limits   ETHANOL - Abnormal; Notable for the following components:   Alcohol, Ethyl (B) 86 (*)    All other components within normal limits  BASIC METABOLIC PANEL - Abnormal; Notable for the following components:   Potassium 3.4 (*)    Glucose, Bld 116 (*)    All other components within normal limits  CBC  MAGNESIUM     EKG  EKG interpretation performed by myself: NSR, nml axis, nml intervals, no acute ischemic changes    RADIOLOGY    PROCEDURES:  Critical Care performed: No  .1-3 Lead EKG Interpretation  Performed by: Rada Hay, MD Authorized by: Rada Hay, MD     Interpretation: normal     ECG rate assessment: normal     Rhythm: sinus rhythm     Ectopy: none     Conduction: normal   .Critical Care  Performed by: Rada Hay, MD Authorized by: Rada Hay, MD   Critical care provider statement:    Critical care time (minutes):  30   Critical care was time spent personally by me on the following activities:  Development of treatment plan with patient or surrogate, discussions with consultants, evaluation of patient's response to treatment, examination of patient, ordering and review of laboratory studies, ordering and review of radiographic studies, ordering and performing treatments and interventions, pulse oximetry, re-evaluation of patient's condition and review of old charts   The patient is on the cardiac monitor to evaluate for evidence of arrhythmia and/or significant heart rate changes.   MEDICATIONS ORDERED IN ED: Medications  potassium chloride 10 mEq in 100 mL IVPB (0 mEq Intravenous Stopped 6/0/45 4098)  folic acid injection 1 mg (1 mg Intravenous Given 05/08/22 1945)  sodium chloride 0.9 % bolus 1,000 mL (0 mLs Intravenous Stopped 05/08/22 2150)  potassium chloride SA (KLOR-CON M) CR tablet 60 mEq (60 mEq Oral Given 05/08/22 1922)  thiamine (B-1) injection 100 mg (100 mg Intravenous Given 05/08/22 1808)     IMPRESSION / MDM /  ASSESSMENT AND PLAN / ED COURSE  I reviewed the triage vital signs and the nursing notes.                              Patient's presentation is most consistent with acute presentation with potential threat to life or bodily function.  Differential diagnosis includes, but is not limited to, vasovagal episode, heat related illness,  dehydration, electrolyte abnormality, cardiac arrhythmia  The patient is a 61 year old female presenting after syncopal episode.  This occurred while she was having her hair curled and was in a very hot house also had been drinking beer.  She felt very hot and then passed out while sitting down.  She had another syncopal episode when she sat up in the EMS stretcher.  Was hypotensive initially.  She received 500 cc of fluid with EMS and after being transitioned out of the hot house feels much improved she denies any chest pain palpitations or dyspnea.  Did have a hysterectomy last month does have history of breast cancer however is not having any shortness of breath chest pain to suggest pulmonary embolism.  Exam is overall reassuring.  She does drink alcohol chronically does not appear clinically intoxicated.  Did not hit her head so do not feel that she needs head imaging.  EKG shows sinus tach without changes concerning for cardiac syncope.  Labs were drawn from triage.  We will give her another liter of fluid I suspect this is dehydration related from being in the hot house and also diuretic effect of the alcohol.  If labs okay and patient feeling improved anticipate discharge.   Patient's labs are notable for significant hypokalemia to 2.3.  On EKG there are no U waves QT intervals normal.  Plan to replete with IV and p.o. potassium.  Magnesium is normal the rest of her labs are reassuring.  Will give 40 mg of IV and 60 p.o. and repeat.   Patient's potassium was repleted and on repeat BMP it is 3.4.  Given no EKG changes otherwise asymptomatic I think she is appropriate  for discharge.  We did discuss maintaining adequate nutrition and having this rechecked in about 1 week to make sure its not dropping again. FINAL CLINICAL IMPRESSION(S) / ED DIAGNOSES   Final diagnoses:  Syncope, unspecified syncope type  Hypokalemia     Rx / DC Orders   ED Discharge Orders     None        Note:  This document was prepared using Dragon voice recognition software and may include unintentional dictation errors.   Rada Hay, MD 05/08/22 [phone]

## 2022-05-08 NOTE — ED Notes (Signed)
Pt is alert and oriented, speaking in full sentences, breathing easy and unlabored, ambulates without assistance, will continue to monitor.

## 2022-05-08 NOTE — ED Notes (Signed)
Received report from Ariel RN

## 2022-05-08 NOTE — ED Triage Notes (Signed)
Pt arrives via EMS from her sister's apartment where she was getting her hair done when she had a syncopal episode- pt states she did also have a couple beers- per EMS her initial BP was 80/40 when pt was getting up to go to stretcher she had a second syncopal episode- pt got 500 NS ans last BP per EMS was 108/66- pt states she feels much better now

## 2022-05-14 ENCOUNTER — Inpatient Hospital Stay: Payer: 59 | Admitting: Internal Medicine

## 2022-05-18 ENCOUNTER — Ambulatory Visit (INDEPENDENT_AMBULATORY_CARE_PROVIDER_SITE_OTHER): Payer: 59 | Admitting: Obstetrics and Gynecology

## 2022-05-18 ENCOUNTER — Encounter: Payer: Self-pay | Admitting: Obstetrics and Gynecology

## 2022-05-18 VITALS — BP 100/68 | HR 120 | Ht 65.0 in | Wt 187.5 lb

## 2022-05-18 DIAGNOSIS — Z9889 Other specified postprocedural states: Secondary | ICD-10-CM

## 2022-05-18 NOTE — Progress Notes (Signed)
HPI:      Michelle Walker is a 61 y.o. [id number] who LMP was Michelle Walker's last menstrual period was 01/05/2017.  Subjective:  Michelle Walker Michelle Walker presents today approximately 5 weeks from hysterectomy for CIN-3.  Michelle Walker reports that Michelle Walker is doing well.  Has returned to work.  Denies pain or vaginal bleeding.  Plans to go on a cruise August 10.    Hx: The following portions of the Michelle Walker's history were reviewed and updated as appropriate:             Michelle Walker  has a past medical history of Anemia, Arthritis, Breast cancer ([place]) (08/2015), Headache, Hypertension, Peripheral neuropathy, Personal history of radiation therapy (2016), and Thyroid disease. Michelle Walker does not have any pertinent problems on file. Michelle Walker  has a past surgical history that includes Colonoscopy w/ polypectomy (2014); Anterior cruciate ligament repair (Right); Novasure ablation; Breast lumpectomy with sentinel lymph node bx (Right, 08/22/2015); Dilation and curettage of uterus; Colonoscopy with propofol (N/A, 04/16/2019); polypectomy (N/A, 04/16/2019); Breast lumpectomy (Right, 08/22/2015); Breast biopsy (Right, 07/17/2015); Breast biopsy (Left, 09/19/2020); Portacath placement (Right, 12/01/2020); Total mastectomy (Left, 05/06/2021); Mastectomy (Left); and Laparoscopic vaginal hysterectomy with salpingo oophorectomy (Bilateral, 04/12/2022). Michelle Walker family history includes Cancer in Michelle Walker sister; Diabetes in Michelle Walker brother and sister; Stroke in Michelle Walker mother and sister. Michelle Walker  reports that Michelle Walker has been smoking cigarettes. Michelle Walker has a 30.00 pack-year smoking history. Michelle Walker has never used smokeless tobacco. Michelle Walker reports current alcohol use of about 2.0 standard drinks of alcohol per week. Michelle Walker reports that Michelle Walker does not use drugs. Michelle Walker has a current medication list which includes the following prescription(s): allopurinol, amlodipine, calcium carb-cholecalciferol, duloxetine, letrozole, losartan, metoprolol succinate, potassium chloride sa, and pregabalin, and the following  Facility-Administered Medications: sodium chloride flush. Michelle Walker is allergic to triamcinolone and tape.       Review of Systems:  Review of Systems  Constitutional: Denied constitutional symptoms, night sweats, recent illness, fatigue, fever, insomnia and weight loss.  Eyes: Denied eye symptoms, eye pain, photophobia, vision change and visual disturbance.  Ears/Nose/Throat/Neck: Denied ear, nose, throat or neck symptoms, hearing loss, nasal discharge, sinus congestion and sore throat.  Cardiovascular: Denied cardiovascular symptoms, arrhythmia, chest pain/pressure, edema, exercise intolerance, orthopnea and palpitations.  Respiratory: Denied pulmonary symptoms, asthma, pleuritic pain, productive sputum, cough, dyspnea and wheezing.  Gastrointestinal: Denied, gastro-esophageal reflux, melena, nausea and vomiting.  Genitourinary: Denied genitourinary symptoms including symptomatic vaginal discharge, pelvic relaxation issues, and urinary complaints.  Musculoskeletal: Denied musculoskeletal symptoms, stiffness, swelling, muscle weakness and myalgia.  Dermatologic: Denied dermatology symptoms, rash and scar.  Neurologic: Denied neurology symptoms, dizziness, headache, neck pain and syncope.  Psychiatric: Denied psychiatric symptoms, anxiety and depression.  Endocrine: Denied endocrine symptoms including hot flashes and night sweats.   Meds:   Current Outpatient Medications on File Prior to Visit  Medication Sig Dispense Refill   allopurinol (ZYLOPRIM) 100 MG tablet Take 100 mg by mouth at bedtime.     amLODipine (NORVASC) 10 MG tablet Take 20 mg by mouth at bedtime.  0   Calcium Carb-Cholecalciferol (CALCIUM 600+D3 PO) Take 1 tablet by mouth at bedtime.     DULoxetine (CYMBALTA) 60 MG capsule Take 1 capsule (60 mg total) by mouth daily. 90 capsule 1   letrozole (FEMARA) 2.5 MG tablet TAKE 1 TABLET(2.5 MG) BY MOUTH DAILY(START AFTER RADIATION IS OVER) 30 tablet 3   losartan (COZAAR) 100 MG tablet  Take 1 tablet by mouth daily.     metoprolol succinate (TOPROL-XL) 25 MG 24 hr  tablet Take 50 mg by mouth at bedtime.  0   potassium chloride SA (KLOR-CON M) 20 MEQ tablet Take 1 tablet (20 mEq total) by mouth daily. 14 tablet 0   pregabalin (LYRICA) 75 MG capsule Take 2 capsules (150 mg total) by mouth 2 (two) times daily. 60 capsule 3   Current Facility-Administered Medications on File Prior to Visit  Medication Dose Route Frequency Provider Last Rate Last Admin   sodium chloride flush (NS) 0.9 % injection 10 mL  10 mL Intravenous PRN Sindy Guadeloupe, MD   10 mL at 03/27/21 0813      Objective:     Vitals:   05/18/22 1515  BP: 100/68  Pulse: (!) 120   Filed Weights   05/18/22 1515  Weight: 187 lb 8 oz (85 kg)   Abdomen: Soft.  Non-tender.  No masses.  No HSM.  Incision/s: Intact.  Healing well.  No erythema.  No drainage.     Pelvic:   Vulva: Normal appearance.  No lesions.  Vagina: No lesions or abnormalities noted.  Support: Normal pelvic support.  Urethra No masses tenderness or scarring.  Meatus Normal size without lesions or prolapse  Vag Cuff: Intact.  No lesions.  Sutures noted in intact  Anus: Normal exam.  No lesions.  Perineum: Normal exam.  No lesions.        Bimanual   Adnexae: No masses.  Non-tender to palpation.  Cuff: Negative for abnormality.                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) 09/20/2017   Gout 09/20/2017   HTN (hypertension) 09/20/2017   Migraine 09/20/2017   History of endometrial ablation 02/26/2016   Malignant neoplasm of upper-outer quadrant of left breast in female, estrogen receptor positive ([place]) 08/04/2015     1. Post-operative state     Michelle Walker doing very well postop.   Plan:            1.  Michelle Walker has resumed normal activities.  Have advised no heavy lifting for the next few months.  2.  Follow-up in 3 months. Orders No orders of the defined types were placed in this encounter.   No orders of the defined types were placed in this encounter.     F/U  No follow-ups on file.  Finis Bud, M.D. 05/18/2022 3:38 PM

## 2022-05-18 NOTE — Progress Notes (Signed)
Patient presents for 6 week postop follow-up following hysterectomy. She states no pain, bleeding or trouble post surgery. No other questions or concerns at home.

## 2022-06-07 ENCOUNTER — Other Ambulatory Visit: Payer: Self-pay | Admitting: Oncology

## 2022-06-11 ENCOUNTER — Other Ambulatory Visit: Payer: Self-pay | Admitting: *Deleted

## 2022-06-11 MED ORDER — DULOXETINE HCL 60 MG PO CPEP: 60.0000 mg | ORAL_CAPSULE | Freq: Every day | ORAL | 1 refills | Status: DC

## 2022-06-18 ENCOUNTER — Encounter: Payer: Self-pay | Admitting: Nurse Practitioner

## 2022-06-18 ENCOUNTER — Ambulatory Visit: Payer: 59 | Admitting: Nurse Practitioner

## 2022-06-18 ENCOUNTER — Other Ambulatory Visit: Payer: 59

## 2022-06-18 ENCOUNTER — Inpatient Hospital Stay: Payer: 59

## 2022-06-18 ENCOUNTER — Ambulatory Visit: Payer: 59

## 2022-06-18 ENCOUNTER — Inpatient Hospital Stay: Payer: 59 | Attending: Oncology

## 2022-06-18 ENCOUNTER — Inpatient Hospital Stay (HOSPITAL_BASED_OUTPATIENT_CLINIC_OR_DEPARTMENT_OTHER): Payer: 59 | Admitting: Nurse Practitioner

## 2022-06-18 VITALS — BP 140/88 | HR 116 | Temp 98.1°F | Resp 18 | Wt 192.7 lb

## 2022-06-18 DIAGNOSIS — Z08 Encounter for follow-up examination after completed treatment for malignant neoplasm: Secondary | ICD-10-CM

## 2022-06-18 DIAGNOSIS — Z170 Estrogen receptor positive status [ER+]: Secondary | ICD-10-CM | POA: Diagnosis not present

## 2022-06-18 DIAGNOSIS — F17210 Nicotine dependence, cigarettes, uncomplicated: Secondary | ICD-10-CM

## 2022-06-18 DIAGNOSIS — C50012 Malignant neoplasm of nipple and areola, left female breast: Secondary | ICD-10-CM

## 2022-06-18 DIAGNOSIS — Z5181 Encounter for therapeutic drug level monitoring: Secondary | ICD-10-CM

## 2022-06-18 DIAGNOSIS — T451X5A Adverse effect of antineoplastic and immunosuppressive drugs, initial encounter: Secondary | ICD-10-CM

## 2022-06-18 DIAGNOSIS — I10 Essential (primary) hypertension: Secondary | ICD-10-CM

## 2022-06-18 DIAGNOSIS — Z7983 Long term (current) use of bisphosphonates: Secondary | ICD-10-CM

## 2022-06-18 DIAGNOSIS — E876 Hypokalemia: Secondary | ICD-10-CM | POA: Diagnosis not present

## 2022-06-18 DIAGNOSIS — Z808 Family history of malignant neoplasm of other organs or systems: Secondary | ICD-10-CM | POA: Insufficient documentation

## 2022-06-18 DIAGNOSIS — G620 Drug-induced polyneuropathy: Secondary | ICD-10-CM | POA: Diagnosis not present

## 2022-06-18 DIAGNOSIS — C50912 Malignant neoplasm of unspecified site of left female breast: Secondary | ICD-10-CM | POA: Insufficient documentation

## 2022-06-18 DIAGNOSIS — Z79811 Long term (current) use of aromatase inhibitors: Secondary | ICD-10-CM | POA: Insufficient documentation

## 2022-06-18 DIAGNOSIS — Z853 Personal history of malignant neoplasm of breast: Secondary | ICD-10-CM | POA: Diagnosis not present

## 2022-06-18 LAB — COMPREHENSIVE METABOLIC PANEL
ALT: 13 U/L (ref 0–44)
AST: 21 U/L (ref 15–41)
Albumin: 3.7 g/dL (ref 3.5–5.0)
Alkaline Phosphatase: 62 U/L (ref 38–126)
Anion gap: 6 (ref 5–15)
BUN: 11 mg/dL (ref 6–20)
CO2: 24 mmol/L (ref 22–32)
Calcium: 9.2 mg/dL (ref 8.9–10.3)
Chloride: 106 mmol/L (ref 98–111)
Creatinine, Ser: 0.58 mg/dL (ref 0.44–1.00)
GFR, Estimated: >60 mL/min (ref >60)
Glucose, Bld: 109 mg/dL — ABNORMAL HIGH (ref 70–99)
Potassium: 3.2 mmol/L — ABNORMAL LOW (ref 3.5–5.1)
Sodium: 136 mmol/L (ref 135–145)
Total Bilirubin: 0.8 mg/dL (ref 0.3–1.2)
Total Protein: 7.3 g/dL (ref 6.5–8.1)

## 2022-06-18 LAB — CBC
HCT: 38.6 % (ref 36.0–46.0)
Hemoglobin: 12.5 g/dL (ref 12.0–15.0)
MCH: 27.7 pg (ref 26.0–34.0)
MCHC: 32.4 g/dL (ref 30.0–36.0)
MCV: 85.6 fL (ref 80.0–100.0)
Platelets: 190 10*3/uL (ref 150–400)
RBC: 4.51 MIL/uL (ref 3.87–5.11)
RDW: 12.7 % (ref 11.5–15.5)
WBC: 5.9 10*3/uL (ref 4.0–10.5)
nRBC: 0 % (ref 0.0–0.2)

## 2022-06-18 MED ORDER — ZOLEDRONIC ACID 4 MG/100ML IV SOLN
4.0000 mg | INTRAVENOUS | Status: DC
  Administered 2022-06-18: 4 mg via INTRAVENOUS
  Filled 2022-06-18: qty 100

## 2022-06-18 MED ORDER — SODIUM CHLORIDE 0.9 % IV SOLN
Freq: Once | INTRAVENOUS | Status: AC
  Filled 2022-06-18: qty 250

## 2022-06-18 MED ORDER — POTASSIUM CHLORIDE CRYS ER 20 MEQ PO TBCR: 20.0000 meq | EXTENDED_RELEASE_TABLET | Freq: Every day | ORAL | 2 refills | Status: DC

## 2022-06-18 NOTE — Patient Instructions (Signed)
[facility]  Discharge Instructions: Thank you for choosing [facility] to provide your oncology and hematology care.  If you have a lab appointment with the Cancer Center, please go directly to the Cancer Center and check in at the registration area.  Wear comfortable clothing and clothing appropriate for easy access to any Portacath or PICC line.   We strive to give you quality time with your provider. You may need to reschedule your appointment if you arrive late (15 or more minutes).  Arriving late affects you and other patients whose appointments are after yours.  Also, if you miss three or more appointments without notifying the office, you may be dismissed from the clinic at the provider's discretion.      For prescription refill requests, have your pharmacy contact our office and allow 72 hours for refills to be completed.    Today you received the following chemotherapy and/or immunotherapy agents Zometa.      To help prevent nausea and vomiting after your treatment, we encourage you to take your nausea medication as directed.  BELOW ARE SYMPTOMS THAT SHOULD BE REPORTED IMMEDIATELY: *FEVER GREATER THAN 100.4 F (38 C) OR HIGHER *CHILLS OR SWEATING *NAUSEA AND VOMITING THAT IS NOT CONTROLLED WITH YOUR NAUSEA MEDICATION *UNUSUAL SHORTNESS OF BREATH *UNUSUAL BRUISING OR BLEEDING *URINARY PROBLEMS (pain or burning when urinating, or frequent urination) *BOWEL PROBLEMS (unusual diarrhea, constipation, pain near the anus) TENDERNESS IN MOUTH AND THROAT WITH OR WITHOUT PRESENCE OF ULCERS (sore throat, sores in mouth, or a toothache) UNUSUAL RASH, SWELLING OR PAIN  UNUSUAL VAGINAL DISCHARGE OR ITCHING   Items with * indicate a potential emergency and should be followed up as soon as possible or go to the Emergency Department if any problems should occur.  Please show the CHEMOTHERAPY ALERT CARD or IMMUNOTHERAPY ALERT CARD at check-in to the  Emergency Department and triage nurse.  Should you have questions after your visit or need to cancel or reschedule your appointment, please contact [facility]  [phone] and follow the prompts.  Office hours are 8:00 a.m. to 4:30 p.m. Monday - Friday. Please note that voicemails left after 4:00 p.m. may not be returned until the following business day.  We are closed weekends and major holidays. You have access to a nurse at all times for urgent questions. Please call the main number to the clinic [phone] and follow the prompts.  For any non-urgent questions, you may also contact your provider using MyChart. We now offer e-Visits for anyone 18 and older to request care online for non-urgent symptoms. For details visit [email or web address].   Also download the MyChart app! Go to the app store, search "MyChart", open the app, select [facility], and log in with your MyChart username and password.  Masks are optional in the cancer centers. If you would like for your care team to wear a mask while they are taking care of you, please let them know. For doctor visits, patients may have with them one support person who is at least 60 years old. At this time, visitors are not allowed in the infusion area.   

## 2022-06-18 NOTE — Progress Notes (Signed)
Hematology/Oncology Consult Note [facility]  Telephone:([phone] Fax:([phone]) [phone]  Patient Care Team: Casilda Carls, MD as PCP - General (Internal Medicine) Casilda Carls, MD as Consulting Physician (Internal Medicine) Christene Lye, MD (General Surgery) Garrel Ridgel, [place] as Consulting Physician (Podiatry) Sindy Guadeloupe, MD as Consulting Physician (Hematology and Oncology) [name], MD as Consulting Physician (General Surgery) Noreene Filbert, MD as Consulting Physician (Radiation Oncology)   Name of the patient: Michelle Walker  [id number]  1960/11/19   Date of visit: 06/18/22  Diagnosis- recurrent left breast cancer stage II [id number] ER/PR positive HER-2 negative  Chief complaint/ Reason for visit-routine follow-up of breast cancer and to receive Zometa  Heme/Onc history: Patient is a 61 year old female who was diagnosed with 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  She did not require adjuvant chemotherapy.  She was initially on tamoxifen which she could not tolerate and was switched to letrozole.  Last seen by me in June 2019 at which point she had a menstrual cycle and therefore not given another AI.  She had subsequently did not follow-up with me and remained off hormone therapy.     More recently patient underwent a diagnostic bilateral mammogram which showed a 2.2 x 1.9 x 1.9 cm mass at the 12 o'clock position of the left breast 1 cm from the nipple.  She was also found to have 5 other smaller breast masses ranging from 0.3 to 0.8 cm.  Noted to have a solitary left axillary lymph node with cortical thickening.  She had a biopsy of the dominant left breast mass as well as another smaller breast mass and the left axillary lymph node all of which were positive for metastatic invasive mammary carcinoma grade 2.  Tumor was ER 91 200% positive PR 71 to 80% positive  and HER-2 IHC equivocal but negative by FISH   MRI of the bilateral breasts showed no abnormal findings in the right breast.  At least 6 discrete hypoechoic masses in the left breast with the largest one measuring 2.2 cm with 1 abnormal left axillary lymph node.  The size of the other breast masses ranging from 51m to 1.4 cm.  The total area of masses and non-mass enhancement was about 10 x 5 x 6.5 cm.  CT chest abdomen and pelvis with contrast showed no evidence of distant metastatic disease.  Bone scan was also negative.   MammaPrint done on the biopsy specimen came back as high risk with greater than 12% chemotherapy benefit.  Plan is for neoadjuvant dose dense AC-Taxol chemotherapy followed by surgery.  Start of chemotherapy was delayed since patient tested positive for Covid   Patient completed neoadjuvant ddAC- taxol chemo between feb-June 2022.    Patient underwent left mastectomy with axillary lymph node dissection.  Final pathology showed 12 mm multifocal invasive mammary carcinoma at least 3 of them in number overall grade 2 with negative margins.  Metastatic carcinoma involving 3 out of 12 lymph nodes.  No extranodal extension identified.  Largest site of metastatic deposit 3 mm.  pT1c pN1a   Patient completed adjuvant radiation therapy in October 2022.  Patient is currently receiving Zometa every 3 months and will get that for 2 years.      Interval history- Michelle GORNIAKis a 61y.o. female who returns to clinic for consider of zometa and continued breast cancer surveillance. Has ongoing fatigue. Weight up 5 lbs. Just  returned from cruise to [place]. She feels well. Denies lumps, bumps. Mild hot flashes which are unchanged and not bothersome. No chest wall pain.   ECOG PS- 1 Pain scale- 0 Opioid associated constipation- no  Review of systems- Review of Systems  Constitutional:  Positive for malaise/fatigue. Negative for chills, fever and weight loss.  HENT:  Negative for  congestion, ear discharge and nosebleeds.   Eyes:  Negative for blurred vision.  Respiratory:  Negative for cough, hemoptysis, sputum production, shortness of breath and wheezing.   Cardiovascular:  Negative for chest pain, palpitations, orthopnea and claudication.  Gastrointestinal:  Negative for abdominal pain, blood in stool, constipation, diarrhea, heartburn, melena, nausea and vomiting.  Genitourinary:  Negative for dysuria, flank pain, frequency, hematuria and urgency.  Musculoskeletal:  Negative for back pain, joint pain and myalgias.  Skin:  Negative for rash.  Neurological:  Positive for sensory change (Peripheral neuropathy). Negative for dizziness, tingling, focal weakness, seizures, weakness and headaches.  Endo/Heme/Allergies:  Does not bruise/bleed easily.  Psychiatric/Behavioral:  Negative for depression and suicidal ideas. The patient does not have insomnia.      Allergies  Allergen Reactions   Triamcinolone Other (See Comments)    Hypopigmentation s/p steroid injection   Tape Rash    SURGICAL TAPE AFTER BREAST BIOPSY    Past Medical History:  Diagnosis Date   Anemia    Arthritis    Breast cancer ([place]) 08/2015   1.5 mm invasive lobular cancer, 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 Wide excision/ RT, T1a,N0. ER/PR pos, Her 2.neg. Tamoxifen x 6 months, d/c secondary to vasomotor sympotms.    Headache    Hypertension    Peripheral neuropathy    Personal history of radiation therapy 2016   RIGHT lumpectomy   Thyroid disease     Past Surgical History:  Procedure Laterality Date   ANTERIOR CRUCIATE LIGAMENT REPAIR Right    BREAST BIOPSY Right 07/17/2015   INVASIVE LOBULAR CARCINOMA, CLASSIC TYPE (1.5 MM), ARISING IN A    BREAST BIOPSY Left 09/19/2020   [place] bx 3 areas/ 1oc q clip/ 12 oc vision clip, axilla lymph node hydromark shape 3/ path pending   BREAST LUMPECTOMY Right 08/22/2015   BREAST LUMPECTOMY WITH SENTINEL LYMPH NODE BIOPSY Right 08/22/2015   Procedure: BREAST  LUMPECTOMY WITH SENTINEL LYMPH NODE BX;  Surgeon: Christene Lye, MD;  Location: [facility];  Service: General;  Laterality: Right;   COLONOSCOPY W/ POLYPECTOMY  2014   COLONOSCOPY WITH PROPOFOL N/A 04/16/2019   Procedure: COLONOSCOPY WITH BIOPSIES;  Surgeon: Lucilla Lame, MD;  Location: [facility];  Service: Endoscopy;  Laterality: N/A;   DILATION AND CURETTAGE OF UTERUS     20 years ago   LAPAROSCOPIC VAGINAL HYSTERECTOMY WITH SALPINGO OOPHORECTOMY Bilateral 04/12/2022   Procedure: LAPAROSCOPIC ASSISTED VAGINAL HYSTERECTOMY WITH SALPINGO OOPHORECTOMY;  Surgeon: Harlin Heys, MD;  Location: [facility];  Service: Gynecology;  Laterality: Bilateral;   MASTECTOMY Left    2021   NOVASURE ABLATION     POLYPECTOMY N/A 04/16/2019   Procedure: POLYPECTOMY;  Surgeon: Lucilla Lame, MD;  Location: [facility];  Service: Endoscopy;  Laterality: N/A;   PORTACATH PLACEMENT Right 12/01/2020   Procedure: INSERTION PORT-A-CATH;  Surgeon: [name], MD;  Location: [facility];  Service: General;  Laterality: Right;   TOTAL MASTECTOMY Left 05/06/2021   Procedure: modified radical mastectomy;  Surgeon: [name], MD;  Location: [facility];  Service: General;  Laterality: Left;  Provider requesting 1.5 hours / 90 minutes for procedure  Social History   Socioeconomic History   Marital status: Married    Spouse name: Vick   Number of children: 1   Years of education: Not on file   Highest education level: Not on file  Occupational History   Not on file  Tobacco Use   Smoking status: Every Day    Packs/day: 0.75    Years: 40.00    Total pack years: 30.00    Types: Cigarettes   Smokeless tobacco: Never  Vaping Use   Vaping Use: Never used  Substance and Sexual Activity   Alcohol use: Yes    Alcohol/week: 2.0 standard drinks of alcohol    Types: 2 Standard drinks or equivalent per week    Comment: BEER 1-2 QD   Drug use: No   Sexual activity: Not Currently     Birth control/protection: Surgical    Comment: Hysterectomy  Other Topics Concern   Not on file  Social History Narrative   Daughter and 3 kids in home, her Brother is in the home as well.   Social Determinants of Health   Financial Resource Strain: Not on file  Food Insecurity: Not on file  Transportation Needs: Not on file  Physical Activity: Not on file  Stress: Not on file  Social Connections: Not on file  Intimate Partner Violence: Not on file    Family History  Problem Relation Age of Onset   Stroke Mother    Cancer Sister        sarcoma on arm; chemo   Diabetes Sister    Stroke Sister    Diabetes Brother    Breast cancer Neg Hx    Ovarian cancer Neg Hx    Colon cancer Neg Hx    Heart disease Neg Hx      Current Outpatient Medications:    allopurinol (ZYLOPRIM) 100 MG tablet, Take 100 mg by mouth at bedtime., Disp: , Rfl:    amLODipine (NORVASC) 10 MG tablet, Take 20 mg by mouth at bedtime., Disp: , Rfl: 0   atorvastatin (LIPITOR) 10 MG tablet, Take 10 mg by mouth daily., Disp: , Rfl:    Calcium Carb-Cholecalciferol (CALCIUM 600+D3 PO), Take 1 tablet by mouth at bedtime., Disp: , Rfl:    DULoxetine (CYMBALTA) 60 MG capsule, Take 1 capsule (60 mg total) by mouth daily., Disp: 90 capsule, Rfl: 1   letrozole (FEMARA) 2.5 MG tablet, TAKE 1 TABLET(2.5 MG) BY MOUTH DAILY. START AFTER RADIATION IS OVER, Disp: 90 tablet, Rfl: 1   losartan (COZAAR) 100 MG tablet, Take 1 tablet by mouth daily., Disp: , Rfl:    metoprolol succinate (TOPROL-XL) 25 MG 24 hr tablet, Take 50 mg by mouth at bedtime., Disp: , Rfl: 0   potassium chloride SA (KLOR-CON M) 20 MEQ tablet, Take 1 tablet (20 mEq total) by mouth daily., Disp: 14 tablet, Rfl: 0   pregabalin (LYRICA) 75 MG capsule, Take 2 capsules (150 mg total) by mouth 2 (two) times daily., Disp: 60 capsule, Rfl: 3 No current facility-administered medications for this visit.  Facility-Administered Medications Ordered in Other Visits:     sodium chloride flush (NS) 0.9 % injection 10 mL, 10 mL, Intravenous, PRN, Sindy Guadeloupe, MD, 10 mL at 03/27/21 0813  Physical exam:  Vitals:   06/18/22 0832  BP: (!) 140/88  Pulse: (!) 116  Resp: 18  Temp: 98.1 F (36.7 C)  SpO2: 98%  Weight: 192 lb 11.2 oz (87.4 kg)   Physical Exam Constitutional:  General: She is not in acute distress. Cardiovascular:     Rate and Rhythm: Normal rate and regular rhythm.     Heart sounds: Normal heart sounds.  Pulmonary:     Effort: Pulmonary effort is normal.     Breath sounds: Normal breath sounds.  Skin:    General: Skin is warm and dry.  Neurological:     Mental Status: She is alert and oriented to person, place, and time.   Breast exam: deferred.      Latest Ref Rng & Units 06/18/2022    8:07 AM  CMP  Glucose 70 - 99 mg/dL 109   BUN 6 - 20 mg/dL 11   Creatinine 0.44 - 1.00 mg/dL 0.58   Sodium 135 - 145 mmol/L 136   Potassium 3.5 - 5.1 mmol/L 3.2   Chloride 98 - 111 mmol/L 106   CO2 22 - 32 mmol/L 24   Calcium 8.9 - 10.3 mg/dL 9.2   Total Protein 6.5 - 8.1 g/dL 7.3   Total Bilirubin 0.3 - 1.2 mg/dL 0.8   Alkaline Phos 38 - 126 U/L 62   AST 15 - 41 U/L 21   ALT 0 - 44 U/L 13       Latest Ref Rng & Units 06/18/2022    8:07 AM  CBC  WBC 4.0 - 10.5 K/uL 5.9   Hemoglobin 12.0 - 15.0 g/dL 12.5   Hematocrit 36.0 - 46.0 % 38.6   Platelets 150 - 400 K/uL 190      Assessment and plan- Patient is a 61 y.o. 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  she is s/p 4 cycles of dose dense AC and 12 weekly cycles of taxol neoadjuvant chemotherapy.  Patient underwent left mastectomy without reconstruction and final pathology showed multifocal invasive mammary carcinoma [id number].  She is currently on letrozole and here for routine follow-up and to receive Zometa  Counts ok to receive Zometa today which she  will continue every 3 months for 2 years/through 09/2023.  Hormone receptor positive breast cancer-  ER positive breast cancer patient is currently on letrozole along with calcium and vitamin D which she will continue. Mammogram from 10/2021 was benign. Plan to repeat in December 2023.   CIPN- adjuvant chemotherapy completed in June 2022.  She has subsequently developed significant neuropathy which continues to worsen.  She has had 2 falls since her last visit.  She is unable to tolerate higher doses of gabapentin. Switched her to Lyrica at this time.  She will also continue Cymbalta.  Now followed by Dr. Mickeal Skinner.   Hypokalemia- persistently low potassium. Continue daily potassium supplementation of 20 meq daily.   Dispo: Zometa today 3 mo- lab (cbc, cmp), Dr. Janese Banks, +/- zometa  Visit Diagnosis 1. Encounter for follow-up surveillance of breast cancer   2. Encounter for monitoring zoledronic acid therapy   3. Chemotherapy-induced neuropathy ([place])    Beckey Rutter, [place], AGNP-C [facility] [phone] (clinic) 06/18/2022

## 2022-06-25 ENCOUNTER — Ambulatory Visit: Payer: 59 | Admitting: Internal Medicine

## 2022-07-20 ENCOUNTER — Encounter: Payer: Self-pay | Admitting: Obstetrics and Gynecology

## 2022-07-20 ENCOUNTER — Ambulatory Visit (INDEPENDENT_AMBULATORY_CARE_PROVIDER_SITE_OTHER): Payer: 59 | Admitting: Obstetrics and Gynecology

## 2022-07-20 VITALS — BP 128/86 | HR 99 | Ht 65.0 in | Wt 188.1 lb

## 2022-07-20 DIAGNOSIS — Z9889 Other specified postprocedural states: Secondary | ICD-10-CM

## 2022-07-20 DIAGNOSIS — D069 Carcinoma in situ of cervix, unspecified: Secondary | ICD-10-CM

## 2022-07-20 NOTE — Progress Notes (Signed)
HPI:      [name] is a 61 y.o. [id number] who LMP was Patient's last menstrual period was 01/05/2017.  Subjective:   She presents today approximately 3 months from hysterectomy for CIN-3.  She reports she has resumed all normal activities including intercourse without issue.  She says no pain or bleeding.    Hx: The following portions of the patient's history were reviewed and updated as appropriate:             She  has a past medical history of Anemia, Arthritis, Breast cancer ([place]) (08/2015), Headache, Hypertension, Peripheral neuropathy, Personal history of radiation therapy (2016), and Thyroid disease. She does not have any pertinent problems on file. She  has a past surgical history that includes Colonoscopy w/ polypectomy (2014); Anterior cruciate ligament repair (Right); Novasure ablation; Breast lumpectomy with sentinel lymph node bx (Right, 08/22/2015); Dilation and curettage of uterus; Colonoscopy with propofol (N/A, 04/16/2019); polypectomy (N/A, 04/16/2019); Breast lumpectomy (Right, 08/22/2015); Breast biopsy (Right, 07/17/2015); Breast biopsy (Left, 09/19/2020); Portacath placement (Right, 12/01/2020); Total mastectomy (Left, 05/06/2021); Mastectomy (Left); and Laparoscopic vaginal hysterectomy with salpingo oophorectomy (Bilateral, 04/12/2022). Her family history includes Cancer in her sister; Diabetes in her brother and sister; Stroke in her mother and sister. She  reports that she has been smoking cigarettes. She has a 30.00 pack-year smoking history. She has never used smokeless tobacco. She reports current alcohol use of about 2.0 standard drinks of alcohol per week. She reports that she does not use drugs. She has a current medication list which includes the following prescription(s): allopurinol, amlodipine, atorvastatin, calcium carb-cholecalciferol, duloxetine, letrozole, losartan, metoprolol succinate, potassium chloride sa, and pregabalin, and the following  Facility-Administered Medications: sodium chloride flush. She is allergic to triamcinolone and tape.       Review of Systems:  Review of Systems  Constitutional: Denied constitutional symptoms, night sweats, recent illness, fatigue, fever, insomnia and weight loss.  Eyes: Denied eye symptoms, eye pain, photophobia, vision change and visual disturbance.  Ears/Nose/Throat/Neck: Denied ear, nose, throat or neck symptoms, hearing loss, nasal discharge, sinus congestion and sore throat.  Cardiovascular: Denied cardiovascular symptoms, arrhythmia, chest pain/pressure, edema, exercise intolerance, orthopnea and palpitations.  Respiratory: Denied pulmonary symptoms, asthma, pleuritic pain, productive sputum, cough, dyspnea and wheezing.  Gastrointestinal: Denied, gastro-esophageal reflux, melena, nausea and vomiting.  Genitourinary: Denied genitourinary symptoms including symptomatic vaginal discharge, pelvic relaxation issues, and urinary complaints.  Musculoskeletal: Denied musculoskeletal symptoms, stiffness, swelling, muscle weakness and myalgia.  Dermatologic: Denied dermatology symptoms, rash and scar.  Neurologic: Denied neurology symptoms, dizziness, headache, neck pain and syncope.  Psychiatric: Denied psychiatric symptoms, anxiety and depression.  Endocrine: Denied endocrine symptoms including hot flashes and night sweats.   Meds:   Current Outpatient Medications on File Prior to Visit  Medication Sig Dispense Refill   allopurinol (ZYLOPRIM) 100 MG tablet Take 100 mg by mouth at bedtime.     amLODipine (NORVASC) 10 MG tablet Take 20 mg by mouth at bedtime.  0   atorvastatin (LIPITOR) 10 MG tablet Take 10 mg by mouth daily.     Calcium Carb-Cholecalciferol (CALCIUM 600+D3 PO) Take 1 tablet by mouth at bedtime.     DULoxetine (CYMBALTA) 60 MG capsule Take 1 capsule (60 mg total) by mouth daily. 90 capsule 1   letrozole (FEMARA) 2.5 MG tablet TAKE 1 TABLET(2.5 MG) BY MOUTH DAILY. START  AFTER RADIATION IS OVER 90 tablet 1   losartan (COZAAR) 100 MG tablet Take 1 tablet by mouth daily.  metoprolol succinate (TOPROL-XL) 25 MG 24 hr tablet Take 50 mg by mouth at bedtime.  0   potassium chloride SA (KLOR-CON M) 20 MEQ tablet Take 1 tablet (20 mEq total) by mouth daily. 30 tablet 2   pregabalin (LYRICA) 75 MG capsule Take 2 capsules (150 mg total) by mouth 2 (two) times daily. 60 capsule 3   Current Facility-Administered Medications on File Prior to Visit  Medication Dose Route Frequency Provider Last Rate Last Admin   sodium chloride flush (NS) 0.9 % injection 10 mL  10 mL Intravenous PRN Sindy Guadeloupe, MD   10 mL at 03/27/21 0813      Objective:     Vitals:   07/20/22 1001  BP: 128/86  Pulse: 99   Filed Weights   07/20/22 1001  Weight: 188 lb 1.6 oz (85.3 kg)                        Assessment:    G2P2002 Patient Active Problem List   Diagnosis Date Noted   Post-operative state 04/12/2022   Chemotherapy-induced neuropathy ([place]) 04/02/2022   Trochanteric bursitis of right hip 09/10/2021   Pain in joint of right hip 09/10/2021   S/P mastectomy, left 05/12/2021   Breast cancer metastasized to axillary lymph node, right ([place]) 05/06/2021   Prophylaxis for chemotherapy-induced neutropenia 01/16/2021   Genetic testing 11/26/2020   Goals of care, counseling/discussion 11/04/2020   Breast cancer, left ([place]) 11/04/2020   History of colonic polyps    Polyp of ascending colon    Depression 09/20/2017   DM2 (diabetes mellitus, type 2) ([place]) 09/20/2017   Gout 09/20/2017   HTN (hypertension) 09/20/2017   Migraine 09/20/2017   History of endometrial ablation 02/26/2016   Malignant neoplasm of upper-outer quadrant of left breast in female, estrogen receptor positive ([place]) 08/04/2015     1. CIN III (cervical intraepithelial neoplasia grade III) with severe dysplasia     Reports no problems-doing well.   Plan:            1.  Plan first Pap smear at annual  examination in June.  2.  CIN-3 again discussed in detail with the patient.  Appropriate follow-up discussed. Orders No orders of the defined types were placed in this encounter.   No orders of the defined types were placed in this encounter.     F/U  Return in about 9 months (around 04/20/2023). I spent 18 minutes involved in the care of this patient preparing to see the patient by obtaining and reviewing her medical history (including labs, imaging tests and prior procedures), documenting clinical information in the electronic health record (EHR), counseling and coordinating care plans, writing and sending prescriptions, ordering tests or procedures and in direct communicating with the patient and medical staff discussing pertinent items from her history and physical exam.  Finis Bud, M.D. 07/20/2022 10:27 AM

## 2022-07-20 NOTE — Progress Notes (Signed)
Patient presents for 3 month postop follow-up following hysterectomy. She states no pain, bleeding or any trouble since surgery. No other concerns at this time.

## 2022-07-30 ENCOUNTER — Other Ambulatory Visit: Payer: Self-pay | Admitting: Internal Medicine

## 2022-07-30 DIAGNOSIS — R069 Unspecified abnormalities of breathing: Secondary | ICD-10-CM

## 2022-08-16 ENCOUNTER — Encounter: Payer: Self-pay | Admitting: Radiation Oncology

## 2022-08-16 ENCOUNTER — Ambulatory Visit
Admission: RE | Admit: 2022-08-16 | Discharge: 2022-08-16 | Disposition: A | Discharge status: Home / Self Care | Payer: 59 | Source: Ambulatory Visit | Attending: Radiation Oncology | Admitting: Radiation Oncology

## 2022-08-16 ENCOUNTER — Encounter: Payer: Self-pay | Admitting: *Deleted

## 2022-08-16 VITALS — BP 156/96 | HR 126 | Resp 16 | Wt 190.0 lb

## 2022-08-16 DIAGNOSIS — Z923 Personal history of irradiation: Secondary | ICD-10-CM | POA: Insufficient documentation

## 2022-08-16 DIAGNOSIS — Z170 Estrogen receptor positive status [ER+]: Secondary | ICD-10-CM | POA: Insufficient documentation

## 2022-08-16 DIAGNOSIS — Z79811 Long term (current) use of aromatase inhibitors: Secondary | ICD-10-CM | POA: Diagnosis not present

## 2022-08-16 DIAGNOSIS — C50411 Malignant neoplasm of upper-outer quadrant of right female breast: Secondary | ICD-10-CM | POA: Diagnosis present

## 2022-08-16 NOTE — Progress Notes (Signed)
Radiation Oncology Follow up Note  Name: Michelle Walker   Date:   08/16/2022 MRN:  [id number] DOB: [birth year]    This 61 y.o. female presents to the clinic today for 1 year follow-up status post left chest wall and peripheral lymphatic radiation therapy for stage II (T2 N1 M0) ER/PR positive invasive mammary carcinoma status post neoadjuvant chemotherapy followed by left modified radical mastectomy.  REFERRING PROVIDER: Casilda Carls, MD  HPI: Patient is a 61 year old female now out 1 year having completed left chest wall and peripheral lymphatic radiation for stage II ER/PR positive invasive mammary carcinoma status post neoadjuvant chemotherapy followed by left modified radical mastectomy.  Seen today in routine follow-up she is doing well.  She specifically denies any left chest wall masses or nodularity or any swelling in her left upper extremity..  She had a right breast mammogram about 6 months ago which was BI-RADS 2 benign which I have reviewed.  She is currently on Femara tolerating it well without side effect  COMPLICATIONS OF TREATMENT: none  FOLLOW UP COMPLIANCE: keeps appointments   PHYSICAL EXAM:  BP (!) 156/96 (BP Location: Right Arm, Patient Position: Sitting, Cuff Size: Large) Comment: Patient monitors pressure at home ususally WNL  Pulse (!) 126 Comment: Had stress test a week ago WNL  Resp 16   Wt 190 lb (86.2 kg)   LMP 01/05/2017   BMI 31.62 kg/m  Patient status post left modified radical mastectomy.  Chest wall is clear.  Right breast is free of dominant mass.  No axillary or supraclavicular adenopathy is appreciated.  Well-developed well-nourished patient in NAD. HEENT reveals PERLA, EOMI, discs not visualized.  Oral cavity is clear. No oral mucosal lesions are identified. Neck is clear without evidence of cervical or supraclavicular adenopathy. Lungs are clear to A&P. Cardiac examination is essentially unremarkable with regular rate and rhythm without murmur rub or  thrill. Abdomen is benign with no organomegaly or masses noted. Motor sensory and DTR levels are equal and symmetric in the upper and lower extremities. Cranial nerves II through XII are grossly intact. Proprioception is intact. No peripheral adenopathy or edema is identified. No motor or sensory levels are noted. Crude visual fields are within normal range.  RADIOLOGY RESULTS: Mammogram of right breast reviewed compatible with above-stated findings  PLAN: Present time patient is seeing medical oncology twice a year.  I am seeing her once year in the same time Dr. Elroy Channel to turn follow-up care over to medical oncology just to cut down the patient visits.  Patient knows to call with any concerns at any time I be happy to reevaluate her at any time.  I would like to take this opportunity to thank you for allowing me to participate in the care of your patient.Noreene Filbert, MD

## 2022-08-19 ENCOUNTER — Telehealth: Payer: Self-pay

## 2022-08-19 NOTE — Telephone Encounter (Signed)
Faxed  FMLA paperwork to [employer] at [phone] and receieved a fax confirmation. Paperwork will be sent to be scanned into chart.

## 2022-08-30 ENCOUNTER — Encounter (INDEPENDENT_AMBULATORY_CARE_PROVIDER_SITE_OTHER): Payer: Self-pay

## 2022-09-20 ENCOUNTER — Inpatient Hospital Stay: Payer: 59 | Attending: Oncology | Admitting: Oncology

## 2022-09-20 ENCOUNTER — Inpatient Hospital Stay: Payer: 59

## 2022-09-20 ENCOUNTER — Encounter: Payer: Self-pay | Admitting: Oncology

## 2022-09-20 VITALS — BP 156/99 | HR 78 | Temp 98.2°F | Resp 17 | Wt 190.0 lb

## 2022-09-20 DIAGNOSIS — Z923 Personal history of irradiation: Secondary | ICD-10-CM | POA: Diagnosis not present

## 2022-09-20 DIAGNOSIS — Z170 Estrogen receptor positive status [ER+]: Secondary | ICD-10-CM | POA: Diagnosis not present

## 2022-09-20 DIAGNOSIS — I10 Essential (primary) hypertension: Secondary | ICD-10-CM | POA: Insufficient documentation

## 2022-09-20 DIAGNOSIS — C50812 Malignant neoplasm of overlapping sites of left female breast: Secondary | ICD-10-CM | POA: Diagnosis present

## 2022-09-20 DIAGNOSIS — C50912 Malignant neoplasm of unspecified site of left female breast: Secondary | ICD-10-CM

## 2022-09-20 DIAGNOSIS — Z08 Encounter for follow-up examination after completed treatment for malignant neoplasm: Secondary | ICD-10-CM

## 2022-09-20 DIAGNOSIS — F17210 Nicotine dependence, cigarettes, uncomplicated: Secondary | ICD-10-CM | POA: Diagnosis not present

## 2022-09-20 DIAGNOSIS — G620 Drug-induced polyneuropathy: Secondary | ICD-10-CM | POA: Insufficient documentation

## 2022-09-20 DIAGNOSIS — C773 Secondary and unspecified malignant neoplasm of axilla and upper limb lymph nodes: Secondary | ICD-10-CM | POA: Insufficient documentation

## 2022-09-20 DIAGNOSIS — Z5181 Encounter for therapeutic drug level monitoring: Secondary | ICD-10-CM

## 2022-09-20 DIAGNOSIS — Z79811 Long term (current) use of aromatase inhibitors: Secondary | ICD-10-CM | POA: Insufficient documentation

## 2022-09-20 DIAGNOSIS — Z9012 Acquired absence of left breast and nipple: Secondary | ICD-10-CM | POA: Insufficient documentation

## 2022-09-20 DIAGNOSIS — Z808 Family history of malignant neoplasm of other organs or systems: Secondary | ICD-10-CM

## 2022-09-20 DIAGNOSIS — C50012 Malignant neoplasm of nipple and areola, left female breast: Secondary | ICD-10-CM

## 2022-09-20 DIAGNOSIS — T451X5A Adverse effect of antineoplastic and immunosuppressive drugs, initial encounter: Secondary | ICD-10-CM

## 2022-09-20 DIAGNOSIS — Z7983 Long term (current) use of bisphosphonates: Secondary | ICD-10-CM

## 2022-09-20 LAB — COMPREHENSIVE METABOLIC PANEL
ALT: 19 U/L (ref 0–44)
AST: 25 U/L (ref 15–41)
Albumin: 3.8 g/dL (ref 3.5–5.0)
Alkaline Phosphatase: 74 U/L (ref 38–126)
Anion gap: 4 — ABNORMAL LOW (ref 5–15)
BUN: 16 mg/dL (ref 6–20)
CO2: 25 mmol/L (ref 22–32)
Calcium: 9.3 mg/dL (ref 8.9–10.3)
Chloride: 105 mmol/L (ref 98–111)
Creatinine, Ser: 0.6 mg/dL (ref 0.44–1.00)
GFR, Estimated: >60 mL/min (ref >60)
Glucose, Bld: 90 mg/dL (ref 70–99)
Potassium: 4 mmol/L (ref 3.5–5.1)
Sodium: 134 mmol/L — ABNORMAL LOW (ref 135–145)
Total Bilirubin: 0.5 mg/dL (ref 0.3–1.2)
Total Protein: 7.5 g/dL (ref 6.5–8.1)

## 2022-09-20 LAB — CBC WITH DIFFERENTIAL/PLATELET
Abs Immature Granulocytes: 0.03 10*3/uL (ref 0.00–0.07)
Basophils Absolute: 0 10*3/uL (ref 0.0–0.1)
Basophils Relative: 0 %
Eosinophils Absolute: 0.1 10*3/uL (ref 0.0–0.5)
Eosinophils Relative: 2 %
HCT: 40.7 % (ref 36.0–46.0)
Hemoglobin: 13 g/dL (ref 12.0–15.0)
Immature Granulocytes: 1 %
Lymphocytes Relative: 25 %
Lymphs Abs: 1.3 10*3/uL (ref 0.7–4.0)
MCH: 26.9 pg (ref 26.0–34.0)
MCHC: 31.9 g/dL (ref 30.0–36.0)
MCV: 84.1 fL (ref 80.0–100.0)
Monocytes Absolute: 0.5 10*3/uL (ref 0.1–1.0)
Monocytes Relative: 10 %
Neutro Abs: 3.4 10*3/uL (ref 1.7–7.7)
Neutrophils Relative %: 62 %
Platelets: 182 10*3/uL (ref 150–400)
RBC: 4.84 MIL/uL (ref 3.87–5.11)
RDW: 14.1 % (ref 11.5–15.5)
WBC: 5.4 10*3/uL (ref 4.0–10.5)
nRBC: 0 % (ref 0.0–0.2)

## 2022-09-20 MED ORDER — SODIUM CHLORIDE 0.9 % IV SOLN
INTRAVENOUS | Status: DC
  Filled 2022-09-20: qty 250

## 2022-09-20 MED ORDER — ZOLEDRONIC ACID 4 MG/100ML IV SOLN
4.0000 mg | INTRAVENOUS | Status: DC
  Administered 2022-09-20: 4 mg via INTRAVENOUS
  Filled 2022-09-20: qty 100

## 2022-09-20 NOTE — Progress Notes (Signed)
Hematology/Oncology Consult note [facility]  [phone] [phone]  Patient Care Team: Casilda Carls, MD as PCP - General (Internal Medicine) Casilda Carls, MD as Consulting Physician (Internal Medicine) Christene Lye, MD (General Surgery) Garrel Ridgel, [place] as Consulting Physician (Podiatry) Sindy Guadeloupe, MD as Consulting Physician (Hematology and Oncology) [name], MD as Consulting Physician (General Surgery) Noreene Filbert, MD as Consulting Physician (Radiation Oncology)   Name of the patient: Michelle Walker  [id number]  1960/11/08   Date of visit: 09/20/22  Diagnosis-  recurrent left breast cancer stage II [id number] ER/PR positive HER-2 negative   Chief complaint/ Reason for visit- routine f/u of breast cancer  Heme/Onc history: Patient is a 61 year old female who was diagnosed with 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  She did not require adjuvant chemotherapy.  She was initially on tamoxifen which she could not tolerate and was switched to letrozole.  Last seen by me in June 2019 at which point she had a menstrual cycle and therefore not given another AI.  She had subsequently did not follow-up with me and remained off hormone therapy.     More recently patient underwent a diagnostic bilateral mammogram which showed a 2.2 x 1.9 x 1.9 cm mass at the 12 o'clock position of the left breast 1 cm from the nipple.  She was also found to have 5 other smaller breast masses ranging from 0.3 to 0.8 cm.  Noted to have a solitary left axillary lymph node with cortical thickening.  She had a biopsy of the dominant left breast mass as well as another smaller breast mass and the left axillary lymph node all of which were positive for metastatic invasive mammary carcinoma grade 2.  Tumor was ER 91 200% positive PR 71 to 80% positive and HER-2 IHC  equivocal but negative by FISH   MRI of the bilateral breasts showed no abnormal findings in the right breast.  At least 6 discrete hypoechoic masses in the left breast with the largest one measuring 2.2 cm with 1 abnormal left axillary lymph node.  The size of the other breast masses ranging from 51m to 1.4 cm.  The total area of masses and non-mass enhancement was about 10 x 5 x 6.5 cm.  CT chest abdomen and pelvis with contrast showed no evidence of distant metastatic disease.  Bone scan was also negative.   MammaPrint done on the biopsy specimen came back as high risk with greater than 12% chemotherapy benefit.  Plan is for neoadjuvant dose dense AC-Taxol chemotherapy followed by surgery.  Start of chemotherapy was delayed since patient tested positive for Covid   Patient completed neoadjuvant ddAC- taxol chemo between feb-June 2022.    Patient underwent left mastectomy with axillary lymph node dissection.  Final pathology showed 12 mm multifocal invasive mammary carcinoma at least 3 of them in number overall grade 2 with negative margins.  Metastatic carcinoma involving 3 out of 12 lymph nodes.  No extranodal extension identified.  Largest site of metastatic deposit 3 mm.  pT1c pN1a   Patient completed adjuvant radiation therapy in October 2022.  Patient is currently receiving Zometa every 3 months and will get that for 2 years.      Interval history-patient continues to have problems with neuropathy in her feet for which she is on cymbalta and Lyrica.  Symptoms are no worse but persist  ECOG PS- 1  Pain scale- 3   Review of systems- Review of Systems  Constitutional:  Positive for malaise/fatigue. Negative for chills, fever and weight loss.  HENT:  Negative for congestion, ear discharge and nosebleeds.   Eyes:  Negative for blurred vision.  Respiratory:  Negative for cough, hemoptysis, sputum production, shortness of breath and wheezing.   Cardiovascular:  Negative for chest pain,  palpitations, orthopnea and claudication.  Gastrointestinal:  Negative for abdominal pain, blood in stool, constipation, diarrhea, heartburn, melena, nausea and vomiting.  Genitourinary:  Negative for dysuria, flank pain, frequency, hematuria and urgency.  Musculoskeletal:  Negative for back pain, joint pain and myalgias.  Skin:  Negative for rash.  Neurological:  Positive for sensory change (Peripheral neuropathy). Negative for dizziness, tingling, focal weakness, seizures, weakness and headaches.  Endo/Heme/Allergies:  Does not bruise/bleed easily.  Psychiatric/Behavioral:  Negative for depression and suicidal ideas. The patient does not have insomnia.       Allergies  Allergen Reactions   Triamcinolone Other (See Comments)    Hypopigmentation s/p steroid injection   Tape Rash    SURGICAL TAPE AFTER BREAST BIOPSY     Past Medical History:  Diagnosis Date   Anemia    Arthritis    Breast cancer ([place]) 08/2015   1.5 mm invasive lobular cancer, 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 Wide excision/ RT, T1a,N0. ER/PR pos, Her 2.neg. Tamoxifen x 6 months, d/c secondary to vasomotor sympotms.    Headache    Hypertension    Peripheral neuropathy    Personal history of radiation therapy 2016   RIGHT lumpectomy   Thyroid disease      Past Surgical History:  Procedure Laterality Date   ANTERIOR CRUCIATE LIGAMENT REPAIR Right    BREAST BIOPSY Right 07/17/2015   INVASIVE LOBULAR CARCINOMA, CLASSIC TYPE (1.5 MM), ARISING IN A    BREAST BIOPSY Left 09/19/2020   [place] bx 3 areas/ 1oc q clip/ 12 oc vision clip, axilla lymph node hydromark shape 3/ path pending   BREAST LUMPECTOMY Right 08/22/2015   BREAST LUMPECTOMY WITH SENTINEL LYMPH NODE BIOPSY Right 08/22/2015   Procedure: BREAST LUMPECTOMY WITH SENTINEL LYMPH NODE BX;  Surgeon: Christene Lye, MD;  Location: [facility];  Service: General;  Laterality: Right;   COLONOSCOPY W/ POLYPECTOMY  2014   COLONOSCOPY WITH PROPOFOL N/A 04/16/2019   Procedure:  COLONOSCOPY WITH BIOPSIES;  Surgeon: Lucilla Lame, MD;  Location: [place];  Service: Endoscopy;  Laterality: N/A;   DILATION AND CURETTAGE OF UTERUS     20 years ago   LAPAROSCOPIC VAGINAL HYSTERECTOMY WITH SALPINGO OOPHORECTOMY Bilateral 04/12/2022   Procedure: LAPAROSCOPIC ASSISTED VAGINAL HYSTERECTOMY WITH SALPINGO OOPHORECTOMY;  Surgeon: Harlin Heys, MD;  Location: [facility];  Service: Gynecology;  Laterality: Bilateral;   MASTECTOMY Left    2021   NOVASURE ABLATION     POLYPECTOMY N/A 04/16/2019   Procedure: POLYPECTOMY;  Surgeon: Lucilla Lame, MD;  Location: [place];  Service: Endoscopy;  Laterality: N/A;   PORTACATH PLACEMENT Right 12/01/2020   Procedure: INSERTION PORT-A-CATH;  Surgeon: [name], MD;  Location: [facility];  Service: General;  Laterality: Right;   TOTAL MASTECTOMY Left 05/06/2021   Procedure: modified radical mastectomy;  Surgeon: [name], MD;  Location: [facility];  Service: General;  Laterality: Left;  Provider requesting 1.5 hours / 90 minutes for procedure    Social History   Socioeconomic History   Marital status: Married    Spouse name: Vick   Number of children: 1   Years of  education: Not on file   Highest education level: Not on file  Occupational History   Not on file  Tobacco Use   Smoking status: Every Day    Packs/day: 0.75    Years: 40.00    Total pack years: 30.00    Types: Cigarettes   Smokeless tobacco: Never  Vaping Use   Vaping Use: Never used  Substance and Sexual Activity   Alcohol use: Yes    Alcohol/week: 2.0 standard drinks of alcohol    Types: 2 Standard drinks or equivalent per week    Comment: BEER 1-2 QD   Drug use: No   Sexual activity: Yes    Birth control/protection: Surgical    Comment: Hysterectomy  Other Topics Concern   Not on file  Social History Narrative   Daughter and 3 kids in home, her Brother is in the home as well.   Social Determinants of Health   Financial  Resource Strain: Not on file  Food Insecurity: Not on file  Transportation Needs: Not on file  Physical Activity: Not on file  Stress: Not on file  Social Connections: Not on file  Intimate Partner Violence: Not on file    Family History  Problem Relation Age of Onset   Stroke Mother    Cancer Sister        sarcoma on arm; chemo   Diabetes Sister    Stroke Sister    Diabetes Brother    Breast cancer Neg Hx    Ovarian cancer Neg Hx    Colon cancer Neg Hx    Heart disease Neg Hx      Current Outpatient Medications:    allopurinol (ZYLOPRIM) 100 MG tablet, Take 100 mg by mouth at bedtime., Disp: , Rfl:    amLODipine (NORVASC) 10 MG tablet, Take 20 mg by mouth at bedtime., Disp: , Rfl: 0   atorvastatin (LIPITOR) 10 MG tablet, Take 10 mg by mouth daily., Disp: , Rfl:    Calcium Carb-Cholecalciferol (CALCIUM 600+D3 PO), Take 1 tablet by mouth at bedtime., Disp: , Rfl:    DULoxetine (CYMBALTA) 60 MG capsule, Take 1 capsule (60 mg total) by mouth daily., Disp: 90 capsule, Rfl: 1   hydrochlorothiazide (HYDRODIURIL) 25 MG tablet, Take by mouth., Disp: , Rfl:    letrozole (FEMARA) 2.5 MG tablet, TAKE 1 TABLET(2.5 MG) BY MOUTH DAILY. START AFTER RADIATION IS OVER, Disp: 90 tablet, Rfl: 1   losartan (COZAAR) 100 MG tablet, Take 1 tablet by mouth daily., Disp: , Rfl:    metoprolol succinate (TOPROL-XL) 25 MG 24 hr tablet, Take 50 mg by mouth at bedtime., Disp: , Rfl: 0   potassium chloride SA (KLOR-CON M) 20 MEQ tablet, Take 1 tablet (20 mEq total) by mouth daily., Disp: 30 tablet, Rfl: 2   pregabalin (LYRICA) 75 MG capsule, Take 2 capsules (150 mg total) by mouth 2 (two) times daily., Disp: 60 capsule, Rfl: 3 No current facility-administered medications for this visit.  Facility-Administered Medications Ordered in Other Visits:    0.9 %  sodium chloride infusion, , Intravenous, Continuous, Sindy Guadeloupe, MD, Stopped at 09/20/22 1001   sodium chloride flush (NS) 0.9 % injection 10 mL, 10  mL, Intravenous, PRN, Sindy Guadeloupe, MD, 10 mL at 03/27/21 0813   Zoledronic Acid (ZOMETA) IVPB 4 mg, 4 mg, Intravenous, Q90 days, Sindy Guadeloupe, MD, Stopped at 09/20/22 0955  Physical exam:  Vitals:   09/20/22 0841  BP: (!) 156/99  Pulse: 78  Resp: 17  Temp: 98.2 F (36.8 C)  TempSrc: Oral  SpO2: 100%  Weight: 190 lb (86.2 kg)   Physical Exam Cardiovascular:     Rate and Rhythm: Normal rate and regular rhythm.     Heart sounds: Normal heart sounds.  Pulmonary:     Effort: Pulmonary effort is normal.     Breath sounds: Normal breath sounds.  Abdominal:     General: Bowel sounds are normal.     Palpations: Abdomen is soft.  Skin:    General: Skin is warm and dry.  Neurological:     Mental Status: She is alert and oriented to person, place, and time.   Breast exam: Patient is s/p a right lumpectomy with a well-healed surgical scar.  Chronic changes of angulation from prior radiation.  She is s/p left mastectomy without reconstruction.  No evidence of chest wall recurrence.  No palpable bilateral axillary adenopathy.     Latest Ref Rng & Units 09/20/2022    8:18 AM  CMP  Glucose 70 - 99 mg/dL 90   BUN 6 - 20 mg/dL 16   Creatinine 0.44 - 1.00 mg/dL 0.60   Sodium 135 - 145 mmol/L 134   Potassium 3.5 - 5.1 mmol/L 4.0   Chloride 98 - 111 mmol/L 105   CO2 22 - 32 mmol/L 25   Calcium 8.9 - 10.3 mg/dL 9.3   Total Protein 6.5 - 8.1 g/dL 7.5   Total Bilirubin 0.3 - 1.2 mg/dL 0.5   Alkaline Phos 38 - 126 U/L 74   AST 15 - 41 U/L 25   ALT 0 - 44 U/L 19       Latest Ref Rng & Units 09/20/2022    8:18 AM  CBC  WBC 4.0 - 10.5 K/uL 5.4   Hemoglobin 12.0 - 15.0 g/dL 13.0   Hematocrit 36.0 - 46.0 % 40.7   Platelets 150 - 400 K/uL 182      Assessment and plan- Patient is a 61 y.o. 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  she is s/p 4 cycles of  dose dense AC and 12 weekly cycles of taxol neoadjuvant chemotherapy.  Patient underwent left mastectomy without reconstruction and final pathology showed multifocal invasive mammary carcinoma [id number].  She is here for routine follow-up of breast cancer and to receive Zometa  Clinically patient is doing well with no concerning signs and symptoms of recurrence based on today's exam.  Patient started taking Zometa in November 2022 and is receiving every 3 months and will receive up to November 2024 for high risk breast cancer adjuvantly.  I will arrange for her to get a right breast mammogram in December 2023  Patient will continue taking letrozole at this time and I would ideally would like her to take hormone therapy for 10 years given high risk breast cancer.  Chemo-induced peripheral neuropathy: Continue Cymbalta and Lyrica   Visit Diagnosis 1. Encounter for follow-up surveillance of breast cancer   2. Chemotherapy-induced peripheral neuropathy ([place])      Dr. [name], MD, MPH [facility] [phone] 09/20/2022 12:35 PM

## 2022-09-20 NOTE — Progress Notes (Signed)
Patient here for oncology follow-up appointment, expresses no complaints or concerns at this time.    

## 2022-09-22 ENCOUNTER — Other Ambulatory Visit: Payer: Self-pay | Admitting: Nurse Practitioner

## 2022-09-22 ENCOUNTER — Other Ambulatory Visit: Payer: Self-pay | Admitting: Oncology

## 2022-10-06 ENCOUNTER — Emergency Department: Payer: 59

## 2022-10-06 ENCOUNTER — Inpatient Hospital Stay
Admission: EM | Admit: 2022-10-06 | Discharge: 2022-10-10 | DRG: 871 | Disposition: A | Discharge status: Home / Self Care | Payer: 59 | Attending: Student in an Organized Health Care Education/Training Program | Admitting: Student in an Organized Health Care Education/Training Program

## 2022-10-06 ENCOUNTER — Other Ambulatory Visit: Payer: Self-pay

## 2022-10-06 DIAGNOSIS — R6520 Severe sepsis without septic shock: Secondary | ICD-10-CM | POA: Diagnosis present

## 2022-10-06 DIAGNOSIS — E785 Hyperlipidemia, unspecified: Secondary | ICD-10-CM | POA: Diagnosis present

## 2022-10-06 DIAGNOSIS — I2489 Other forms of acute ischemic heart disease: Secondary | ICD-10-CM | POA: Diagnosis present

## 2022-10-06 DIAGNOSIS — J157 Pneumonia due to Mycoplasma pneumoniae: Secondary | ICD-10-CM | POA: Diagnosis present

## 2022-10-06 DIAGNOSIS — C50912 Malignant neoplasm of unspecified site of left female breast: Secondary | ICD-10-CM | POA: Diagnosis present

## 2022-10-06 DIAGNOSIS — M1990 Unspecified osteoarthritis, unspecified site: Secondary | ICD-10-CM | POA: Diagnosis present

## 2022-10-06 DIAGNOSIS — J111 Influenza due to unidentified influenza virus with other respiratory manifestations: Secondary | ICD-10-CM | POA: Diagnosis not present

## 2022-10-06 DIAGNOSIS — E876 Hypokalemia: Secondary | ICD-10-CM | POA: Diagnosis not present

## 2022-10-06 DIAGNOSIS — T502X5A Adverse effect of carbonic-anhydrase inhibitors, benzothiadiazides and other diuretics, initial encounter: Secondary | ICD-10-CM | POA: Diagnosis not present

## 2022-10-06 DIAGNOSIS — Z90722 Acquired absence of ovaries, bilateral: Secondary | ICD-10-CM

## 2022-10-06 DIAGNOSIS — J9601 Acute respiratory failure with hypoxia: Secondary | ICD-10-CM | POA: Diagnosis present

## 2022-10-06 DIAGNOSIS — Z9012 Acquired absence of left breast and nipple: Secondary | ICD-10-CM

## 2022-10-06 DIAGNOSIS — J189 Pneumonia, unspecified organism: Secondary | ICD-10-CM | POA: Diagnosis not present

## 2022-10-06 DIAGNOSIS — E669 Obesity, unspecified: Secondary | ICD-10-CM | POA: Diagnosis present

## 2022-10-06 DIAGNOSIS — J440 Chronic obstructive pulmonary disease with acute lower respiratory infection: Secondary | ICD-10-CM | POA: Diagnosis present

## 2022-10-06 DIAGNOSIS — J441 Chronic obstructive pulmonary disease with (acute) exacerbation: Secondary | ICD-10-CM | POA: Diagnosis present

## 2022-10-06 DIAGNOSIS — I272 Pulmonary hypertension, unspecified: Secondary | ICD-10-CM | POA: Diagnosis present

## 2022-10-06 DIAGNOSIS — K449 Diaphragmatic hernia without obstruction or gangrene: Secondary | ICD-10-CM | POA: Diagnosis present

## 2022-10-06 DIAGNOSIS — Z888 Allergy status to other drugs, medicaments and biological substances status: Secondary | ICD-10-CM

## 2022-10-06 DIAGNOSIS — R599 Enlarged lymph nodes, unspecified: Secondary | ICD-10-CM | POA: Diagnosis present

## 2022-10-06 DIAGNOSIS — Z1152 Encounter for screening for COVID-19: Secondary | ICD-10-CM

## 2022-10-06 DIAGNOSIS — Z6834 Body mass index (BMI) 34.0-34.9, adult: Secondary | ICD-10-CM

## 2022-10-06 DIAGNOSIS — J9690 Respiratory failure, unspecified, unspecified whether with hypoxia or hypercapnia: Secondary | ICD-10-CM

## 2022-10-06 DIAGNOSIS — A4189 Other specified sepsis: Secondary | ICD-10-CM | POA: Diagnosis not present

## 2022-10-06 DIAGNOSIS — Z90710 Acquired absence of both cervix and uterus: Secondary | ICD-10-CM | POA: Diagnosis not present

## 2022-10-06 DIAGNOSIS — J1000 Influenza due to other identified influenza virus with unspecified type of pneumonia: Secondary | ICD-10-CM | POA: Diagnosis present

## 2022-10-06 DIAGNOSIS — E871 Hypo-osmolality and hyponatremia: Secondary | ICD-10-CM | POA: Diagnosis present

## 2022-10-06 DIAGNOSIS — R0603 Acute respiratory distress: Secondary | ICD-10-CM | POA: Diagnosis present

## 2022-10-06 DIAGNOSIS — Z923 Personal history of irradiation: Secondary | ICD-10-CM | POA: Diagnosis not present

## 2022-10-06 DIAGNOSIS — R000 Tachycardia, unspecified: Secondary | ICD-10-CM

## 2022-10-06 DIAGNOSIS — Z9079 Acquired absence of other genital organ(s): Secondary | ICD-10-CM

## 2022-10-06 DIAGNOSIS — Z91048 Other nonmedicinal substance allergy status: Secondary | ICD-10-CM

## 2022-10-06 DIAGNOSIS — Z79899 Other long term (current) drug therapy: Secondary | ICD-10-CM

## 2022-10-06 DIAGNOSIS — I10 Essential (primary) hypertension: Secondary | ICD-10-CM | POA: Diagnosis present

## 2022-10-06 DIAGNOSIS — F17210 Nicotine dependence, cigarettes, uncomplicated: Secondary | ICD-10-CM | POA: Diagnosis present

## 2022-10-06 DIAGNOSIS — G629 Polyneuropathy, unspecified: Secondary | ICD-10-CM | POA: Diagnosis present

## 2022-10-06 HISTORY — DX: Respiratory failure, unspecified, unspecified whether with hypoxia or hypercapnia: J96.90

## 2022-10-06 LAB — RESP PANEL BY RT-PCR (FLU A&B, COVID) ARPGX2
Influenza A by PCR: POSITIVE — AB
Influenza B by PCR: NEGATIVE
SARS Coronavirus 2 by RT PCR: NEGATIVE

## 2022-10-06 LAB — PROTIME-INR
INR: 1.2 (ref 0.8–1.2)
Prothrombin Time: 14.8 seconds (ref 11.4–15.2)

## 2022-10-06 LAB — URINALYSIS, COMPLETE (UACMP) WITH MICROSCOPIC
Bacteria, UA: NONE SEEN
Bilirubin Urine: NEGATIVE
Glucose, UA: NEGATIVE mg/dL
Ketones, ur: NEGATIVE mg/dL
Leukocytes,Ua: NEGATIVE
Nitrite: NEGATIVE
Protein, ur: NEGATIVE mg/dL
Specific Gravity, Urine: 1.017 (ref 1.005–1.030)
pH: 6 (ref 5.0–8.0)

## 2022-10-06 LAB — HIV ANTIBODY (ROUTINE TESTING W REFLEX): HIV Screen 4th Generation wRfx: NONREACTIVE

## 2022-10-06 LAB — TROPONIN I (HIGH SENSITIVITY)
Troponin I (High Sensitivity): 342 ng/L (ref <18)
Troponin I (High Sensitivity): 461 ng/L (ref <18)
Troponin I (High Sensitivity): 507 ng/L (ref <18)
Troponin I (High Sensitivity): 419 ng/L (ref <18)

## 2022-10-06 LAB — COMPREHENSIVE METABOLIC PANEL
ALT: 31 U/L (ref 0–44)
AST: 154 U/L — ABNORMAL HIGH (ref 15–41)
Albumin: 3.7 g/dL (ref 3.5–5.0)
Alkaline Phosphatase: 57 U/L (ref 38–126)
Anion gap: 9 (ref 5–15)
BUN: 8 mg/dL (ref 6–20)
CO2: 26 mmol/L (ref 22–32)
Calcium: 9 mg/dL (ref 8.9–10.3)
Chloride: 97 mmol/L — ABNORMAL LOW (ref 98–111)
Creatinine, Ser: 0.66 mg/dL (ref 0.44–1.00)
GFR, Estimated: >60 mL/min (ref >60)
Glucose, Bld: 127 mg/dL — ABNORMAL HIGH (ref 70–99)
Potassium: 3.2 mmol/L — ABNORMAL LOW (ref 3.5–5.1)
Sodium: 132 mmol/L — ABNORMAL LOW (ref 135–145)
Total Bilirubin: 0.8 mg/dL (ref 0.3–1.2)
Total Protein: 7.9 g/dL (ref 6.5–8.1)

## 2022-10-06 LAB — CBC WITH DIFFERENTIAL/PLATELET
Abs Immature Granulocytes: 0.03 10*3/uL (ref 0.00–0.07)
Basophils Absolute: 0 10*3/uL (ref 0.0–0.1)
Basophils Relative: 0 %
Eosinophils Absolute: 0 10*3/uL (ref 0.0–0.5)
Eosinophils Relative: 0 %
HCT: 40.1 % (ref 36.0–46.0)
Hemoglobin: 12.6 g/dL (ref 12.0–15.0)
Immature Granulocytes: 0 %
Lymphocytes Relative: 4 %
Lymphs Abs: 0.3 10*3/uL — ABNORMAL LOW (ref 0.7–4.0)
MCH: 26.4 pg (ref 26.0–34.0)
MCHC: 31.4 g/dL (ref 30.0–36.0)
MCV: 84.1 fL (ref 80.0–100.0)
Monocytes Absolute: 0.3 10*3/uL (ref 0.1–1.0)
Monocytes Relative: 4 %
Neutro Abs: 6.3 10*3/uL (ref 1.7–7.7)
Neutrophils Relative %: 92 %
Platelets: 173 10*3/uL (ref 150–400)
RBC: 4.77 MIL/uL (ref 3.87–5.11)
RDW: 14.4 % (ref 11.5–15.5)
WBC: 6.9 10*3/uL (ref 4.0–10.5)
nRBC: 0 % (ref 0.0–0.2)

## 2022-10-06 LAB — MAGNESIUM: Magnesium: 2 mg/dL (ref 1.7–2.4)

## 2022-10-06 LAB — HEPARIN LEVEL (UNFRACTIONATED)
Heparin Unfractionated: <0.1 IU/mL — ABNORMAL LOW (ref 0.30–0.70)
Heparin Unfractionated: 0.55 IU/mL (ref 0.30–0.70)

## 2022-10-06 LAB — CBC
HCT: 38.3 % (ref 36.0–46.0)
Hemoglobin: 12.1 g/dL (ref 12.0–15.0)
MCH: 26.7 pg (ref 26.0–34.0)
MCHC: 31.6 g/dL (ref 30.0–36.0)
MCV: 84.5 fL (ref 80.0–100.0)
Platelets: 153 10*3/uL (ref 150–400)
RBC: 4.53 MIL/uL (ref 3.87–5.11)
RDW: 14.3 % (ref 11.5–15.5)
WBC: 5.9 10*3/uL (ref 4.0–10.5)
nRBC: 0 % (ref 0.0–0.2)

## 2022-10-06 LAB — LACTIC ACID, PLASMA
Lactic Acid, Venous: 1.8 mmol/L (ref 0.5–1.9)
Lactic Acid, Venous: 1.7 mmol/L (ref 0.5–1.9)

## 2022-10-06 LAB — BLOOD GAS, VENOUS
Acid-Base Excess: 2.6 mmol/L — ABNORMAL HIGH (ref 0.0–2.0)
Bicarbonate: 29.3 mmol/L — ABNORMAL HIGH (ref 20.0–28.0)
O2 Saturation: 53 %
Patient temperature: 37
pCO2, Ven: 53 mmHg (ref 44–60)
pH, Ven: 7.35 (ref 7.25–7.43)
pO2, Ven: 36 mmHg (ref 32–45)

## 2022-10-06 LAB — CREATININE, SERUM
Creatinine, Ser: 0.48 mg/dL (ref 0.44–1.00)
GFR, Estimated: >60 mL/min (ref >60)

## 2022-10-06 LAB — STREP PNEUMONIAE URINARY ANTIGEN: Strep Pneumo Urinary Antigen: NEGATIVE

## 2022-10-06 LAB — MRSA NEXT GEN BY PCR, NASAL: MRSA by PCR Next Gen: NOT DETECTED

## 2022-10-06 LAB — GLUCOSE, CAPILLARY: Glucose-Capillary: 111 mg/dL — ABNORMAL HIGH (ref 70–99)

## 2022-10-06 LAB — APTT: aPTT: 33 seconds (ref 24–36)

## 2022-10-06 LAB — BRAIN NATRIURETIC PEPTIDE: B Natriuretic Peptide: 549.8 pg/mL — ABNORMAL HIGH (ref 0.0–100.0)

## 2022-10-06 LAB — PROCALCITONIN: Procalcitonin: 0.33 ng/mL

## 2022-10-06 MED ORDER — SODIUM CHLORIDE 0.9 % IV SOLN
500.0000 mg | INTRAVENOUS | Status: DC
  Administered 2022-10-07: 500 mg via INTRAVENOUS
  Filled 2022-10-06 (×2): qty 5

## 2022-10-06 MED ORDER — PANTOPRAZOLE SODIUM 40 MG IV SOLR
40.0000 mg | INTRAVENOUS | Status: DC
  Administered 2022-10-07 – 2022-10-10 (×4): 40 mg via INTRAVENOUS
  Filled 2022-10-06 (×4): qty 10

## 2022-10-06 MED ORDER — ACETAMINOPHEN 325 MG PO TABS
650.0000 mg | ORAL_TABLET | ORAL | Status: DC | PRN
  Filled 2022-10-06: qty 2

## 2022-10-06 MED ORDER — METHYLPREDNISOLONE SODIUM SUCC 40 MG IJ SOLR
40.0000 mg | Freq: Two times a day (BID) | INTRAMUSCULAR | Status: DC
  Administered 2022-10-06 – 2022-10-07 (×2): 40 mg via INTRAVENOUS
  Filled 2022-10-06 (×2): qty 1

## 2022-10-06 MED ORDER — POTASSIUM CHLORIDE 10 MEQ/100ML IV SOLN
10.0000 meq | INTRAVENOUS | Status: AC
  Administered 2022-10-06: 10 meq via INTRAVENOUS
  Filled 2022-10-06: qty 100

## 2022-10-06 MED ORDER — SODIUM CHLORIDE 0.9 % IV BOLUS
1000.0000 mL | Freq: Once | INTRAVENOUS | Status: AC
  Administered 2022-10-06: 1000 mL via INTRAVENOUS

## 2022-10-06 MED ORDER — SODIUM CHLORIDE 0.9 % IV SOLN: 1.0000 g | INTRAVENOUS | Status: DC

## 2022-10-06 MED ORDER — ORAL CARE MOUTH RINSE: 15.0000 mL | OROMUCOSAL | Status: DC | PRN

## 2022-10-06 MED ORDER — HEPARIN BOLUS VIA INFUSION
4000.0000 [IU] | Freq: Once | INTRAVENOUS | Status: AC
  Administered 2022-10-06: 4000 [IU] via INTRAVENOUS
  Filled 2022-10-06: qty 4000

## 2022-10-06 MED ORDER — LORAZEPAM 2 MG/ML IJ SOLN
INTRAMUSCULAR | Status: AC
  Filled 2022-10-06: qty 1

## 2022-10-06 MED ORDER — SODIUM CHLORIDE 0.9 % IV SOLN: 500.0000 mg | INTRAVENOUS | Status: DC

## 2022-10-06 MED ORDER — IPRATROPIUM-ALBUTEROL 0.5-2.5 (3) MG/3ML IN SOLN
3.0000 mL | Freq: Once | RESPIRATORY_TRACT | Status: AC
  Administered 2022-10-06: 3 mL via RESPIRATORY_TRACT
  Filled 2022-10-06: qty 9

## 2022-10-06 MED ORDER — SODIUM CHLORIDE 0.9 % IV SOLN
1.0000 g | INTRAVENOUS | Status: DC
  Administered 2022-10-07: 1 g via INTRAVENOUS
  Filled 2022-10-06 (×2): qty 10

## 2022-10-06 MED ORDER — SODIUM CHLORIDE 0.9 % IV SOLN
500.0000 mg | Freq: Once | INTRAVENOUS | Status: AC
  Administered 2022-10-06: 500 mg via INTRAVENOUS
  Filled 2022-10-06: qty 5

## 2022-10-06 MED ORDER — ENOXAPARIN SODIUM 40 MG/0.4ML IJ SOSY: 40.0000 mg | PREFILLED_SYRINGE | INTRAMUSCULAR | Status: DC

## 2022-10-06 MED ORDER — CHLORHEXIDINE GLUCONATE CLOTH 2 % EX PADS
6.0000 | MEDICATED_PAD | Freq: Every day | CUTANEOUS | Status: DC
  Administered 2022-10-06 – 2022-10-10 (×3): 6 via TOPICAL

## 2022-10-06 MED ORDER — BUDESONIDE 0.5 MG/2ML IN SUSP
0.5000 mg | Freq: Two times a day (BID) | RESPIRATORY_TRACT | Status: DC
  Administered 2022-10-06 – 2022-10-10 (×7): 0.5 mg via RESPIRATORY_TRACT
  Filled 2022-10-06 (×6): qty 2

## 2022-10-06 MED ORDER — SODIUM CHLORIDE 0.9% FLUSH
3.0000 mL | Freq: Two times a day (BID) | INTRAVENOUS | Status: DC
  Administered 2022-10-06 – 2022-10-10 (×8): 3 mL via INTRAVENOUS

## 2022-10-06 MED ORDER — LORAZEPAM 2 MG/ML IJ SOLN
1.0000 mg | Freq: Once | INTRAMUSCULAR | Status: AC
  Administered 2022-10-06: 1 mg via INTRAVENOUS

## 2022-10-06 MED ORDER — IPRATROPIUM-ALBUTEROL 0.5-2.5 (3) MG/3ML IN SOLN
3.0000 mL | RESPIRATORY_TRACT | Status: DC
  Administered 2022-10-06 – 2022-10-07 (×6): 3 mL via RESPIRATORY_TRACT
  Filled 2022-10-06 (×6): qty 3

## 2022-10-06 MED ORDER — DOCUSATE SODIUM 100 MG PO CAPS: 100.0000 mg | ORAL_CAPSULE | Freq: Two times a day (BID) | ORAL | Status: DC | PRN

## 2022-10-06 MED ORDER — OSELTAMIVIR PHOSPHATE 75 MG PO CAPS
75.0000 mg | ORAL_CAPSULE | Freq: Two times a day (BID) | ORAL | Status: DC
  Administered 2022-10-07 – 2022-10-10 (×7): 75 mg via ORAL
  Filled 2022-10-06 (×8): qty 1

## 2022-10-06 MED ORDER — ORAL CARE MOUTH RINSE
15.0000 mL | OROMUCOSAL | Status: DC
  Administered 2022-10-06 – 2022-10-07 (×3): 15 mL via OROMUCOSAL

## 2022-10-06 MED ORDER — OSELTAMIVIR PHOSPHATE 75 MG PO CAPS: 75.0000 mg | ORAL_CAPSULE | Freq: Every day | ORAL | Status: DC

## 2022-10-06 MED ORDER — POLYETHYLENE GLYCOL 3350 17 G PO PACK: 17.0000 g | PACK | Freq: Every day | ORAL | Status: DC | PRN

## 2022-10-06 MED ORDER — IOHEXOL 350 MG/ML SOLN
75.0000 mL | Freq: Once | INTRAVENOUS | Status: AC | PRN
  Administered 2022-10-06: 75 mL via INTRAVENOUS

## 2022-10-06 MED ORDER — SODIUM CHLORIDE 0.9 % IV SOLN
250.0000 mL | INTRAVENOUS | Status: DC | PRN
  Administered 2022-10-06: 250 mL via INTRAVENOUS

## 2022-10-06 MED ORDER — HEPARIN (PORCINE) 25000 UT/250ML-% IV SOLN
900.0000 [IU]/h | INTRAVENOUS | Status: DC
  Administered 2022-10-06: 1000 [IU]/h via INTRAVENOUS
  Filled 2022-10-06: qty 250

## 2022-10-06 MED ORDER — SODIUM CHLORIDE 0.9% FLUSH: 3.0000 mL | INTRAVENOUS | Status: DC | PRN

## 2022-10-06 MED ORDER — POTASSIUM CHLORIDE 10 MEQ/100ML IV SOLN
10.0000 meq | INTRAVENOUS | Status: AC
  Administered 2022-10-06: 10 meq via INTRAVENOUS
  Filled 2022-10-06 (×2): qty 100

## 2022-10-06 MED ORDER — SODIUM CHLORIDE 0.9 % IV SOLN
1.0000 g | Freq: Once | INTRAVENOUS | Status: AC
  Administered 2022-10-06: 1 g via INTRAVENOUS
  Filled 2022-10-06: qty 10

## 2022-10-06 MED ORDER — ONDANSETRON HCL 4 MG/2ML IJ SOLN: 4.0000 mg | Freq: Four times a day (QID) | INTRAMUSCULAR | Status: DC | PRN

## 2022-10-06 NOTE — Consult Note (Signed)
ANTICOAGULATION CONSULT NOTE - Follow Up Consult  Pharmacy Consult for heparin gtt Indication: chest pain/ACS w/ further w/u pending for PE  Allergies  Allergen Reactions   Triamcinolone Other (See Comments)    Hypopigmentation s/p steroid injection   Tape Rash    SURGICAL TAPE AFTER BREAST BIOPSY    Patient Measurements: Height: 5' 5.98" (167.6 cm) Weight: 85.7 kg (188 lb 15 oz) (entered based on historic ht/wt from chart) IBW/kg (Calculated) : 59.26 Heparin Dosing Weight: 79.1 kg  Vital Signs: Temp: 98.5 F (36.9 C) (12/06 1002) Temp Source: Oral (12/06 1002) BP: 144/93 (12/06 1145) Pulse Rate: 138 (12/06 1145)  Labs: Recent Labs    10/06/22 1019  HGB 12.6  HCT 40.1  PLT 173  CREATININE 0.66  TROPONINIHS 342*    Estimated Creatinine Clearance: 83.6 mL/min (by C-G formula based on SCr of 0.66 mg/dL).   Medications: Heparin Dosing Weight: 79.1 kg PTA: no AC/APT PTA Inpatient:  Assessment: 61 yo F w/ h/o breast cancer status postlumpectomy and radiation therapy who has been 1 year out from treatment, smoker, hyperlipidemia and hypertension who presents to the emergency department with dyspnea and dry cough for the last 2 days. Pt has c/o chest pain with Troponin 342 & pending w/u for PE. Pharmacy consulted for mgmt of heparin gtt  Date Time aPTT/HL Rate/Comment       Baseline Labs: aPTT - pending; INR - pending; Hgb - 12.6; Plts - 173k  Goal of Therapy:  Heparin level 0.3-0.7 units/ml Monitor platelets by anticoagulation protocol: Yes   Plan:   Give 4000 units bolus x1; then start heparin infusion at 1000 units/hr Check anti-Xa level in 6 hours and daily once consecutively therapeutic. Continue to monitor H&H and platelets daily while on heparin gtt.  Shanon Brow Salvator Seppala 10/06/2022,12:50 PM

## 2022-10-06 NOTE — ED Notes (Signed)
Unable to get second blood cultures. Called lab for draw.

## 2022-10-06 NOTE — ED Notes (Signed)
ICU MD and NP at bedside evaluating pt.

## 2022-10-06 NOTE — ED Provider Notes (Signed)
[facility] Provider Note    Event Date/Time   First MD Initiated Contact with Patient 10/06/22 1019     (approximate)   History   Respiratory Distress   HPI  Michelle Walker is a 61 y.o. female   Past medical history of breast cancer status postlumpectomy and radiation therapy who has been 1 year out from treatment, smoker, hyperlipidemia and hypertension who presents to the emergency department with dyspnea and dry cough for the last 2 days.  EMS reports that she was 75% and in respiratory distress on room air.  She was placed on nonrebreather and subsequently her oxygen saturation has been 95%.  She states that he has had a substantial cough for the last couple of days and has chest pain on the right side and abdominal soreness when coughing.  Otherwise she has had no chest pain or pressure.  She denies nasal congestion sore throat, has had subjective fevers.  Denies abdominal pain, nausea, vomiting or urinary symptoms.  She has no leg pain or swelling and has no history of blood clot.  History was obtained via the patient.  I reviewed external medical notes including oncology clinic visit note dated 09/20/2022 which characterizes her past oncologic treatment including lumpectomy and adjuvant radiation therapy.      Physical Exam   Triage Vital Signs: ED Triage Vitals  Enc Vitals Group     BP 10/06/22 1002 (!) 143/80     Pulse Rate 10/06/22 1004 (!) 138     Resp 10/06/22 1002 (!) 36     Temp 10/06/22 1002 98.5 F (36.9 C)     Temp Source 10/06/22 1002 Oral     SpO2 10/06/22 1002 97 %     Weight --      Height --      Head Circumference --      Peak Flow --      Pain Score 10/06/22 1003 8     Pain Loc --      Pain Edu? --      Excl. in [place]? --     Most recent vital signs: Vitals:   10/06/22 1430 10/06/22 1508  BP: (!) 171/93 (!) 166/105  Pulse: (!) 137 (!) 140  Resp: (!) 40 (!) 40  Temp: 98.2 F (36.8 C) (!) 101 F (38.3 C)  SpO2:  99% 95%    General: Awake, alert and oriented. CV:  Tachycardic to 130s and hypertensive 140s over 80s.  She does not appear markedly fluid overloaded. Resp:  He is in respiratory distress speaking in 1-2 word sentences and tachypneic to 36.  She has some rhonchi bilaterally but no obvious wheezing or rales. Abd:  No distention.  Nontender to palpation. Other:  Bilateral radial pulses are equal and intact.  She does not have significant peripheral edema and no unilateral leg swelling or pain on palpation.   ED Results / Procedures / Treatments   Labs (all labs ordered are listed, but only abnormal results are displayed) Labs Reviewed  RESP PANEL BY RT-PCR (FLU A&B, COVID) ARPGX2 - Abnormal; Notable for the following components:      Result Value   Influenza A by PCR POSITIVE (*)    All other components within normal limits  CBC WITH DIFFERENTIAL/PLATELET - Abnormal; Notable for the following components:   Lymphs Abs 0.3 (*)    All other components within normal limits  BRAIN NATRIURETIC PEPTIDE - Abnormal; Notable for the following components:  B Natriuretic Peptide 549.8 (*)    All other components within normal limits  COMPREHENSIVE METABOLIC PANEL - Abnormal; Notable for the following components:   Sodium 132 (*)    Potassium 3.2 (*)    Chloride 97 (*)    Glucose, Bld 127 (*)    AST 154 (*)    All other components within normal limits  BLOOD GAS, VENOUS - Abnormal; Notable for the following components:   Bicarbonate 29.3 (*)    Acid-Base Excess 2.6 (*)    All other components within normal limits  HEPARIN LEVEL (UNFRACTIONATED) - Abnormal; Notable for the following components:   Heparin Unfractionated <0.10 (*)    All other components within normal limits  URINALYSIS, COMPLETE (UACMP) WITH MICROSCOPIC - Abnormal; Notable for the following components:   Color, Urine YELLOW (*)    APPearance HAZY (*)    Hgb urine dipstick LARGE (*)    All other components within normal  limits  GLUCOSE, CAPILLARY - Abnormal; Notable for the following components:   Glucose-Capillary 111 (*)    All other components within normal limits  TROPONIN I (HIGH SENSITIVITY) - Abnormal; Notable for the following components:   Troponin I (High Sensitivity) 342 (*)    All other components within normal limits  TROPONIN I (HIGH SENSITIVITY) - Abnormal; Notable for the following components:   Troponin I (High Sensitivity) 461 (*)    All other components within normal limits  CULTURE, BLOOD (ROUTINE X 2)  CULTURE, BLOOD (ROUTINE X 2)  EXPECTORATED SPUTUM ASSESSMENT W GRAM STAIN, RFLX TO RESP C  MRSA NEXT GEN BY PCR, NASAL  MAGNESIUM  LACTIC ACID, PLASMA  LACTIC ACID, PLASMA  APTT  PROTIME-INR  PROCALCITONIN  HIV ANTIBODY (ROUTINE TESTING W REFLEX)  CBC  CREATININE, SERUM  STREP PNEUMONIAE URINARY ANTIGEN  LEGIONELLA PNEUMOPHILA SEROGP 1 UR AG  MYCOPLASMA PNEUMONIAE ANTIBODY, IGM     I reviewed labs and they are notable for influenza positive and a elevated troponin in the 400s  EKG  ED ECG REPORT I, Lucillie Garfinkel, the attending physician, personally viewed and interpreted this ECG.   Date: 10/06/2022  EKG Time: 1011  Rate: 137  Rhythm: sinus tachycardia  Axis: nl  Intervals:none  ST&T Change: no acute ischemic changes noted    RADIOLOGY I independently reviewed and interpreted chest x-ray and see evidence of multifocal opacities concerning for pneumonia   PROCEDURES:  Critical Care performed: Yes, see critical care procedure note(s)  .Critical Care  Performed by: Lucillie Garfinkel, MD Authorized by: Lucillie Garfinkel, MD   Critical care provider statement:    Critical care time (minutes):  30   Critical care was necessary to treat or prevent imminent or life-threatening deterioration of the following conditions:  Respiratory failure   Critical care was time spent personally by me on the following activities:  Development of treatment plan with patient or surrogate,  evaluation of patient's response to treatment, examination of patient, ordering and review of laboratory studies, ordering and review of radiographic studies, ordering and performing treatments and interventions, pulse oximetry, re-evaluation of patient's condition and review of old charts    MEDICATIONS ORDERED IN ED: Medications  heparin ADULT infusion 100 units/mL (25000 units/249m) (1,000 Units/hr Intravenous New Bag/Given 10/06/22 1318)  sodium chloride flush (NS) 0.9 % injection 3 mL (3 mLs Intravenous Not Given 10/06/22 1435)  sodium chloride flush (NS) 0.9 % injection 3 mL (has no administration in time range)  0.9 %  sodium chloride infusion (has no administration  in time range)  acetaminophen (TYLENOL) tablet 650 mg (has no administration in time range)  docusate sodium (COLACE) capsule 100 mg (has no administration in time range)  polyethylene glycol (MIRALAX / GLYCOLAX) packet 17 g (has no administration in time range)  ondansetron (ZOFRAN) injection 4 mg (has no administration in time range)  budesonide (PULMICORT) nebulizer solution 0.5 mg (has no administration in time range)  ipratropium-albuterol (DUONEB) 0.5-2.5 (3) MG/3ML nebulizer solution 3 mL (3 mLs Nebulization Given 10/06/22 1521)  methylPREDNISolone sodium succinate (SOLU-MEDROL) 40 mg/mL injection 40 mg (40 mg Intravenous Given 10/06/22 1441)  azithromycin (ZITHROMAX) 500 mg in sodium chloride 0.9 % 250 mL IVPB (has no administration in time range)  cefTRIAXone (ROCEPHIN) 1 g in sodium chloride 0.9 % 100 mL IVPB (has no administration in time range)  potassium chloride 10 mEq in 100 mL IVPB (has no administration in time range)  pantoprazole (PROTONIX) injection 40 mg (has no administration in time range)  Oral care mouth rinse (has no administration in time range)  Oral care mouth rinse (has no administration in time range)  Chlorhexidine Gluconate Cloth 2 % PADS 6 each (has no administration in time range)   oseltamivir (TAMIFLU) capsule 75 mg (has no administration in time range)  ipratropium-albuterol (DUONEB) 0.5-2.5 (3) MG/3ML nebulizer solution 3 mL (3 mLs Nebulization Given 10/06/22 1034)  sodium chloride 0.9 % bolus 1,000 mL (0 mLs Intravenous Stopped 10/06/22 1152)  LORazepam (ATIVAN) injection 1 mg (1 mg Intravenous Given 10/06/22 1045)  cefTRIAXone (ROCEPHIN) 1 g in sodium chloride 0.9 % 100 mL IVPB (0 g Intravenous Stopped 10/06/22 1150)  azithromycin (ZITHROMAX) 500 mg in sodium chloride 0.9 % 250 mL IVPB (0 mg Intravenous Stopped 10/06/22 1319)  iohexol (OMNIPAQUE) 350 MG/ML injection 75 mL (75 mLs Intravenous Contrast Given 10/06/22 1235)  heparin bolus via infusion 4,000 Units (4,000 Units Intravenous Bolus from Bag 10/06/22 1319)  sodium chloride 0.9 % bolus 1,000 mL (1,000 mLs Intravenous New Bag/Given 10/06/22 1341)    Consultants:  I spoke with hospitalist and ICU consultant regarding care plan for this patient.   IMPRESSION / MDM / ASSESSMENT AND PLAN / ED COURSE  I reviewed the triage vital signs and the nursing notes.                              Differential diagnosis includes, but is not limited to, hypoxemic respiratory failure due to respiratory infection, CHF exacerbation, COPD exacerbation, pulmonary embolism, ACS, sepsis   The patient is on the cardiac monitor to evaluate for evidence of arrhythmia and/or significant heart rate changes.  MDM: This patient has a no diagnosis of COPD or asthma but is a chronic smoker who presents with respiratory distress with nonproductive cough for 2 days.  We will give a nebulizer treatment to assess for improvement in respiratory status.  Placed on BiPAP for increased work of breathing while ongoing work-up.  She does not appear fluid overloaded so I doubt she has CHF exacerbation.  More likely respiratory infection with subjective fever and cough.  Will give empiric community-acquired pneumonia coverage.  CT angiogram to assess for PE in  this patient with history of breast cancer though not actively being treated.  Check EKG, which is nonischemic, and follow-up with serial troponins though I doubt cardiac ischemia given no chest pain.  Since she does not appear fluid overloaded and has no history of heart failure can proceed with 1 L of  crystalloid to assess for fluid responsiveness of tachycardia  She continued to be tachycardic and tachypneic with increased work of breathing on BiPAP.  ICU was consulted for admission.  She has influenza and multifocal pneumonia, negative for PE on CT. Subjectively she feels better but has ongoing tachypnea I fear that she may tire, she is hesitant to be intubated but agrees if medical team feels necessary.  I will defer to ICU team at this time who is assessing the patient   Patient's presentation is most consistent with acute presentation with potential threat to life or bodily function.       FINAL CLINICAL IMPRESSION(S) / ED DIAGNOSES   Final diagnoses:  None     Rx / DC Orders   ED Discharge Orders     None        Note:  This document was prepared using Dragon voice recognition software and may include unintentional dictation errors.    Lucillie Garfinkel, MD 10/06/22 [phone]

## 2022-10-06 NOTE — ED Notes (Signed)
Pt still tachypneic with RR around 40, on bipap, HR still elevated. Being treated for NSTEMI. Other RN calling report to ICU now. Daughter at bedside.

## 2022-10-06 NOTE — ED Notes (Signed)
Called pharmacy. They will retime next rocephin and zithromax doses. Were both given today around 1130 and due every 24 hours.

## 2022-10-06 NOTE — Consult Note (Signed)
ANTICOAGULATION CONSULT NOTE - Follow Up Consult  Pharmacy Consult for heparin gtt Indication: chest pain/ACS w/ further w/u pending for PE  Allergies  Allergen Reactions   Triamcinolone Other (See Comments)    Hypopigmentation s/p steroid injection   Tape Rash    SURGICAL TAPE AFTER BREAST BIOPSY    Patient Measurements: Height: '5\' 6"'$  (167.6 cm) Weight: 90.7 kg (199 lb 15.3 oz) IBW/kg (Calculated) : 59.3 Heparin Dosing Weight: 79.1 kg  Vital Signs: Temp: 99 F (37.2 C) (12/06 1545) Temp Source: Axillary (12/06 1545) BP: 144/96 (12/06 2000) Pulse Rate: 124 (12/06 2000)  Labs: Recent Labs    10/06/22 1019 10/06/22 1313 10/06/22 1526 10/06/22 1915  HGB 12.6  --  12.1  --   HCT 40.1  --  38.3  --   PLT 173  --  153  --   APTT  --  33  --   --   LABPROT  --  14.8  --   --   INR  --  1.2  --   --   HEPARINUNFRC  --  <0.10*  --  0.55  CREATININE 0.66  --  0.48  --   TROPONINIHS 342* 461*  --  507*     Estimated Creatinine Clearance: 85.9 mL/min (by C-G formula based on SCr of 0.48 mg/dL).   Medications: Heparin Dosing Weight: 79.1 kg PTA: no AC/APT PTA Inpatient:  Assessment: 61 yo F w/ h/o breast cancer status postlumpectomy and radiation therapy who has been 1 year out from treatment, smoker, hyperlipidemia and hypertension who presents to the emergency department with dyspnea and dry cough for the last 2 days. Pt has c/o chest pain with Troponin 342 & pending w/u for PE. Pharmacy consulted for mgmt of heparin gtt  Date Time aPTT/HL Rate/Comment 12/6 1915 HL=0.55 Therapeutic x1 cont 1000u/hr      Baseline Labs: aPTT - pending; INR - pending; Hgb - 12.6; Plts - 173k  Goal of Therapy:  Heparin level 0.3-0.7 units/ml Monitor platelets by anticoagulation protocol: Yes   Plan:  12/6 1915  HL=0.55 Therapeutic Continue heparin infusion at 1000 units/hr Check confirmatory anti-Xa level in 6 hours and daily once consecutively therapeutic. Continue to monitor  H&H and platelets daily while on heparin gtt.  Viveka Wilmeth A 10/06/2022,8:17 PM

## 2022-10-06 NOTE — ED Notes (Signed)
Date and time results received: 10/06/22 11:14 (use smartphrase ".now" to insert current time)  Test: troponin   Critical Value: 342   Name of Provider Notified: Lucillie Garfinkel

## 2022-10-06 NOTE — ED Notes (Signed)
Lab in the room to pull 2nd blood culture

## 2022-10-06 NOTE — ED Notes (Signed)
RT at bedside placing bipap. Pt was placed on NRB before this. Pt was in 45s on RA in triage. Began feeling SOB yesterday. Hx COPD and breast cancer. L arm restricted.

## 2022-10-06 NOTE — ED Notes (Signed)
Called CT to see where she is on the list, and they say she is next.

## 2022-10-06 NOTE — H&P (Signed)
NAME:  Michelle Walker, MRN:  [id number], DOB:  1961/06/23, LOS: 0 ADMISSION DATE:  10/06/2022, CONSULTATION DATE:  10/06/2022 REFERRING MD:  Dr. Jacelyn Grip, CHIEF COMPLAINT:  Acute Respiratory Distress   Brief Pt Description / Synopsis:  61 y.o. female admitted with Acute Hypoxic Respiratory Failure in the setting of AECOPD due to Influenza A infection with suspected superimposed CAP, and Pulmonary Hypertension requiring BiPAP.  High risk for intubation.  History of Present Illness:  Michelle Walker is a 61 year old female with a past medical history significant for breast cancer status post postlumpectomy and radiation therapy (approximately 1 year out from treatment), current smoker, hypertension, hyperlipidemia who presents to [facility] ED on 10/06/2022 due to acute respiratory distress.  The patient reports progressive shortness of breath, dry cough, and pleuritic chest pain and abdominal soreness associated with coughing since yesterday.  She denies chest pain/pressure, nasal congestion, sore throat, abdominal pain, nausea, vomiting, diarrhea, dysuria, leg pain or swelling.  She does endorse that her husband has been sick since Sunday.  Upon EMS arrival she was noted to be in respiratory distress and hypoxic with O2 saturations of 75% on room air.  She was placed on nonrebreather with improvement in O2 sats to 95%.   ED Course: Initial Vital Signs: Temperature 98.5 F, pulse 138, respiratory rate 36, blood pressure 143/80, SpO2 75% on room air with EMS Significant Labs: Sodium 132, potassium 3.2, glucose 127, AST 154, BNP 549, high-sensitivity troponin 342, lactic acid 1.8, procalcitonin 0.33, WBC 6.9 VBG: pH 7.35/pCO2 53/pO2 36/bicarb 29.3 Positive for influenza A Imaging Chest X-ray>>IMPRESSION: New right-greater-than-left bibasilar consolidations, concerning for multifocal infection. CT angio chest>>IMPRESSION: Fairly extensive patchy alveolar and interstitial infiltrates are seen in both  lungs, more so on the right side suggesting multifocal pneumonia. There is no pleural effusion. There is no evidence of central pulmonary artery embolism. Ectasia of main pulmonary artery suggests pulmonary arterial hypertension. Increased RV LV ratio suggests right heart dysfunction. There is no evidence of thoracic aortic dissection. There are slightly enlarged lymph nodes in mediastinum, possibly suggesting reactive hyperplasia. Left lobe of thyroid is larger than unusual. If clinically warranted, follow-up thyroid sonogram in nonemergent setting may be considered. Small hiatal hernia. Medications Administered: 2 L normal saline bolus, DuoNebs, azithromycin, ceftriaxone, heparin drip initiated  Given her work of breathing and respiratory rate in the 30's, she was placed on BiPAP.  PCCM is asked to admit for further workup and treatment.  Please see "significant hospital events" section below for full detailed hospital course.  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 Wide excision/ RT, T1a,N0. ER/PR pos, Her 2.neg. Tamoxifen x 6 months, d/c secondary to vasomotor sympotms.    Headache    Hypertension    Peripheral neuropathy    Personal history of radiation therapy 2016   RIGHT lumpectomy   Thyroid disease     Micro Data:  12/6: SARS-CoV-2 and influenza PCR>> + influenza A 12/6: Blood culture x 2>> 12/6: Sputum>> 12/6: Strep pneumo urinary antigen>> 12/6: Legionella urinary antigen>> 12/6: Mycoplasma pneumonia>>  Antimicrobials:  Azithromycin 12/6>> Ceftriaxone 12/6>> Tamiflu 12/6>>  Significant Hospital Events: Including procedures, antibiotic start and stop dates in addition to other pertinent events   12/6: Presented to ED with acute respiratory distress, placed on BiPAP.  Found to be positive for influenza with imaging  concerning  for multifocal pneumonia.  Placed on heparin drip for elevated troponins.  PCCM asked to admit, high risk for intubation  Interim History / Subjective:  -Patient seen and examined in the ED -Patient currently on BiPAP, remains tachypneic with respiratory rates in the 40s, is awake and alert and able to speak in short sentences  ~but is high risk for intubation -Patient has never been diagnosed with COPD, however endorses over 30 years of smoking history  Objective   Blood pressure (!) 171/93, pulse (!) 137, temperature 98.2 F (36.8 C), temperature source Axillary, resp. rate (!) 40, height _0  (1.676 m), weight 90.7 kg, last menstrual period 01/05/2017, SpO2 99 %.    FiO2 (%):  [40 %] 40 %   Intake/Output Summary (Last 24 hours) at 10/06/2022 1457 Last data filed at 10/06/2022 1451 Gross per 24 hour  Intake 1307.75 ml  Output 850 ml  Net 457.75 ml   Filed Weights   10/06/22 1200 10/06/22 1251  Weight: 85.7 kg 90.7 kg    Examination: General: Critically ill-appearing female, laying in bed, on BiPAP, with moderate respiratory distress HENT: Atraumatic, normocephalic, neck supple, difficult to assess JVD due to BiPAP and body habitus Lungs: Expiratory wheezing and faint rhonchi throughout, BiPAP assisted, tachypnea with increased work of breathing, even Cardiovascular: Tachycardia, regular rhythm (sinus tachycardia on telemetry), S1-S2, no murmurs, rubs, gallops, 2+ distal pulses Abdomen: Obese, soft, nontender, nondistended, no guarding room tenderness, bowel sounds positive x 4 Extremities: Normal bulk and tone, no deformities, trace edema bilateral lower extremities, no cyanosis Neuro: Awake and alert, oriented x 4, moves all extremities to commands, no focal deficits, pupils PERRLA GU: deferred  Resolved Hospital Problem list     Assessment & Plan:   #Acute Hypoxic Respiratory Failure in the setting of AECOPD due to Influenza A infection with suspected  superimposed CAP, & Pulmonary Hypertension CT angio Chest on admission negative for PE, concerning for multifocal PNA, pulmonary arterial HTN, increased RV:LV ratio concerning for right heart dysfunction -Supplemental O2 as needed to maintain O2 sats 88 to 92% -BiPAP, wean as tolerated -HIGH RISK FOR INTUBATION -Follow intermittent Chest X-ray & ABG as needed -Bronchodilators & Pulmicort nebs -IV Steroids -ABX as above -Diuresis as BP and renal function permits -Pulmonary toilet as able  #Sepsis due to Influenza A Infection & suspected superimposed Community Acquired Pneumonia (present on admission, met SIRS Criteria: HR 138, RR 36) -Monitor fever curve -Trend WBC's & Procalcitonin -Follow cultures as above -Continue empiric Azithromycin, Ceftriaxone,& Tamiflu pending cultures & sensitivities  #Elevated Troponin in setting of demand ischemia vs NSTEMI -Continuous cardiac monitoring -Maintain MAP >65 -Cautious IV fluids -Vasopressors as needed to maintain MAP goal ~ currently not requiring -Trend lactic acid until normalized -Trend HS Troponin until peaked -Heparin gtt -BNP elevated at 549 -Diuresis as BP and renal function permits -Echocardiogram pending -Consider Cardiology consult  #Mild Hyponatremia #Mild Hypokalemia -Monitor I&O's / urinary output -Follow BMP -Ensure adequate renal perfusion -Avoid nephrotoxic agents as able -Replace electrolytes as indicated -Pharmacy following for assistance with electrolyte replacement   Patient is critically ill, prognosis is guarded.  High risk for further decompensation requiring intubation, along with cardiac arrest and death.   Best Practice (right click and "Reselect all SmartList Selections" daily)   Diet/type: NPO DVT prophylaxis: systemic heparin GI prophylaxis: N/A Lines: N/A Foley:  N/A Code Status:  full code Last date of multidisciplinary goals of care discussion [n/a]  12/6: Patient and her daughter updated  at bedside.  All questions answered.  Labs   CBC: Recent Labs  Lab 10/06/22 1019  WBC 6.9  NEUTROABS 6.3  HGB 12.6  HCT 40.1  MCV 84.1  PLT 559    Basic Metabolic Panel: Recent Labs  Lab 10/06/22 1019  NA 132*  K 3.2*  CL 97*  CO2 26  GLUCOSE 127*  BUN 8  CREATININE 0.66  CALCIUM 9.0  MG 2.0   GFR: Estimated Creatinine Clearance: 85.9 mL/min (by C-G formula based on SCr of 0.66 mg/dL). Recent Labs  Lab 10/06/22 1019 10/06/22 1312 10/06/22 1313  PROCALCITON  --  0.33  --   WBC 6.9  --   --   LATICACIDVEN 1.8  --  1.7    Liver Function Tests: Recent Labs  Lab 10/06/22 1019  AST 154*  ALT 31  ALKPHOS 57  BILITOT 0.8  PROT 7.9  ALBUMIN 3.7   No results for input(s): "LIPASE", "AMYLASE" in the last 168 hours. No results for input(s): "AMMONIA" in the last 168 hours.  ABG    Component Value Date/Time   HCO3 29.3 (H) 10/06/2022 1029   O2SAT 53 10/06/2022 1029     Coagulation Profile: Recent Labs  Lab 10/06/22 1313  INR 1.2    Cardiac Enzymes: No results for input(s): "CKTOTAL", "CKMB", "CKMBINDEX", "TROPONINI" in the last 168 hours.  HbA1C: No results found for: "HGBA1C"  CBG: No results for input(s): "GLUCAP" in the last 168 hours.  Review of Systems:   Positives in BOLD: Gen: Denies fever, chills, weight change, fatigue, night sweats HEENT: Denies blurred vision, double vision, hearing loss, tinnitus, sinus congestion, rhinorrhea, sore throat, neck stiffness, dysphagia PULM: Denies shortness of breath, cough, sputum production, hemoptysis, wheezing CV: Denies chest pain, edema, orthopnea, paroxysmal nocturnal dyspnea, palpitations GI: Denies abdominal pain, nausea, vomiting, diarrhea, hematochezia, melena, constipation, change in bowel habits GU: Denies dysuria, hematuria, polyuria, oliguria, urethral discharge Endocrine: Denies hot or cold intolerance, polyuria, polyphagia or appetite change Derm: Denies rash, dry skin, scaling or  peeling skin change Heme: Denies easy bruising, bleeding, bleeding gums Neuro: Denies headache, numbness, weakness, slurred speech, loss of memory or consciousness   Past Medical History:  She,  has a past medical history of Anemia, Arthritis, Breast cancer ([place]) (08/2015), Headache, Hypertension, Peripheral neuropathy, Personal history of radiation therapy (2016), and Thyroid disease.   Surgical History:   Past Surgical History:  Procedure Laterality Date   ANTERIOR CRUCIATE LIGAMENT REPAIR Right    BREAST BIOPSY Right 07/17/2015   INVASIVE LOBULAR CARCINOMA, CLASSIC TYPE (1.5 MM), ARISING IN A    BREAST BIOPSY Left 09/19/2020   [place] bx 3 areas/ 1oc q clip/ 12 oc vision clip, axilla lymph node hydromark shape 3/ path pending   BREAST LUMPECTOMY Right 08/22/2015   BREAST LUMPECTOMY WITH SENTINEL LYMPH NODE BIOPSY Right 08/22/2015   Procedure: BREAST LUMPECTOMY WITH SENTINEL LYMPH NODE BX;  Surgeon: Christene Lye, MD;  Location: [facility];  Service: General;  Laterality: Right;   COLONOSCOPY W/ POLYPECTOMY  2014   COLONOSCOPY WITH PROPOFOL N/A 04/16/2019   Procedure: COLONOSCOPY WITH BIOPSIES;  Surgeon: Lucilla Lame, MD;  Location: [facility];  Service: Endoscopy;  Laterality: N/A;   DILATION AND CURETTAGE OF UTERUS     20 years ago   LAPAROSCOPIC VAGINAL HYSTERECTOMY WITH SALPINGO OOPHORECTOMY Bilateral 04/12/2022   Procedure: LAPAROSCOPIC ASSISTED VAGINAL HYSTERECTOMY WITH SALPINGO OOPHORECTOMY;  Surgeon: Harlin Heys, MD;  Location: [facility];  Service: Gynecology;  Laterality: Bilateral;   MASTECTOMY Left  2021   Greasy     POLYPECTOMY N/A 04/16/2019   Procedure: POLYPECTOMY;  Surgeon: Lucilla Lame, MD;  Location: [facility];  Service: Endoscopy;  Laterality: N/A;   PORTACATH PLACEMENT Right 12/01/2020   Procedure: INSERTION PORT-A-CATH;  Surgeon: [name], MD;  Location: [facility];  Service: General;  Laterality: Right;   TOTAL  MASTECTOMY Left 05/06/2021   Procedure: modified radical mastectomy;  Surgeon: [name], MD;  Location: [facility];  Service: General;  Laterality: Left;  Provider requesting 1.5 hours / 90 minutes for procedure     Social History:   reports that she has been smoking cigarettes. She has a 30.00 pack-year smoking history. She has never used smokeless tobacco. She reports current alcohol use of about 2.0 standard drinks of alcohol per week. She reports that she does not use drugs.   Family History:  Her family history includes Cancer in her sister; Diabetes in her brother and sister; Stroke in her mother and sister. There is no history of Breast cancer, Ovarian cancer, Colon cancer, or Heart disease.   Allergies Allergies  Allergen Reactions   Triamcinolone Other (See Comments)    Hypopigmentation s/p steroid injection   Tape Rash    SURGICAL TAPE AFTER BREAST BIOPSY     Home Medications  Prior to Admission medications   Medication Sig Start Date End Date Taking? Authorizing Provider  allopurinol (ZYLOPRIM) 100 MG tablet Take 100 mg by mouth at bedtime.   Yes [provider]  amLODipine (NORVASC) 10 MG tablet Take 20 mg by mouth at bedtime.   Yes [provider]  atorvastatin (LIPITOR) 10 MG tablet Take 10 mg by mouth daily. 05/27/22  Yes [provider]  Calcium Carb-Cholecalciferol (CALCIUM 600+D3 PO) Take 1 tablet by mouth at bedtime.   Yes [provider]  DULoxetine (CYMBALTA) 60 MG capsule Take 1 capsule (60 mg total) by mouth daily. 06/11/22  Yes Borders, Kirt Boys, NP  hydrochlorothiazide (HYDRODIURIL) 25 MG tablet Take by mouth. 02/15/22  Yes [provider]  letrozole ([place]) 2.5 MG tablet TAKE 1 TABLET(2.5 MG) BY MOUTH DAILY. START AFTER RADIATION IS OVER 09/27/22  Yes Sindy Guadeloupe, MD  losartan (COZAAR) 100 MG tablet Take 1 tablet by mouth daily. 08/26/19  Yes [provider]  metoprolol succinate (TOPROL-XL) 25 MG 24  hr tablet Take 50 mg by mouth at bedtime.   Yes [provider]  potassium chloride SA (KLOR-CON M) 20 MEQ tablet TAKE 1 TABLET(20 MEQ) BY MOUTH DAILY 10/01/22  Yes Verlon Au, NP  pregabalin (LYRICA) 75 MG capsule Take 2 capsules (150 mg total) by mouth 2 (two) times daily. 04/02/22  Yes Vaslow, Acey Lav, MD     Critical care time: 50 minutes     Darel Hong, AGACNP-BC [facility] Prefer epic messenger for cross cover needs If after hours, please call E-link

## 2022-10-06 NOTE — ED Notes (Signed)
RT and CT called. Pt is next on list for CTA. RT is on way. Covid swabs have now arrived from lab so pt will be covid swabbed.

## 2022-10-06 NOTE — ED Notes (Signed)
Still waiting on covid swab to be sent from lab.

## 2022-10-06 NOTE — ED Notes (Signed)
Pt is not tolerating bipap. Very anxious, asking to have mask removed, fidgeting in bed. EDP messaged for ativan order. Xray at bedside. Lab called for covid swabs.

## 2022-10-06 NOTE — ED Triage Notes (Signed)
Pt states shortness of breath and abdominal pain that yesterday. Pt came in from [place] [facility] on 4lpm satting at 75%. Pt placed on non-rebreather and saturating at 97%  No signature for MSE due to pt condition

## 2022-10-06 NOTE — ED Notes (Signed)
Daughter at bedside. Explained findings and plan of care so far.

## 2022-10-06 NOTE — Progress Notes (Signed)
RT assisted with patient transport to and from CT with no complications. Pt transported on V60. Sats 97% upon arrival back to ER room 17.

## 2022-10-06 NOTE — ED Notes (Signed)
This RN accompanied pt to CT and back.

## 2022-10-06 NOTE — ED Notes (Signed)
Second trop is higher (461). Informed Dr Jacelyn Grip face to face. Heparin is running. Pt denies chest pain but is still tachypneic with RR in 30s and 40s on bipap.

## 2022-10-06 NOTE — Consult Note (Signed)
PHARMACY CONSULT NOTE - FOLLOW UP  Pharmacy Consult for Electrolyte Monitoring and Replacement   Recent Labs: Potassium (mmol/L)  Date Value  10/06/2022 3.2 (L)  10/18/2013 3.6   Magnesium (mg/dL)  Date Value  10/06/2022 2.0   Calcium (mg/dL)  Date Value  10/06/2022 9.0   Albumin (g/dL)  Date Value  10/06/2022 3.7   Sodium (mmol/L)  Date Value  10/06/2022 132 (L)     Assessment: Pharmacy has been consulted to monitor and replace electrolytes in 61yo female with PMH of breast cancer s/p lumpectomy and radiation therapy(last treatment was approximately 1 year ago) presenting the the ED with dyspnea and dry cough for the last 2 days. EMS reports that her O2 level was 75% and in respiratory distress on room air. [name] was place on nonrebreather and subsequently her oxygen saturation has increased to 95%.  IVF: none  Goal of Therapy:  Electrolytes WNL  Plan:  - KCL 32mq IV x 2 doses ordered as patient is NPO currently - Will recheck electrolytes with AM labs  Michelle Walker,PharmD Clinical Pharmacist 10/06/2022 3:23 PM

## 2022-10-07 ENCOUNTER — Inpatient Hospital Stay
Admit: 2022-10-07 | Discharge: 2022-10-07 | Disposition: A | Discharge status: Home / Self Care | Payer: 59 | Attending: Pulmonary Disease | Admitting: Pulmonary Disease

## 2022-10-07 DIAGNOSIS — J9601 Acute respiratory failure with hypoxia: Secondary | ICD-10-CM | POA: Diagnosis not present

## 2022-10-07 DIAGNOSIS — J189 Pneumonia, unspecified organism: Secondary | ICD-10-CM | POA: Diagnosis not present

## 2022-10-07 LAB — MAGNESIUM: Magnesium: 2.3 mg/dL (ref 1.7–2.4)

## 2022-10-07 LAB — ECHOCARDIOGRAM COMPLETE
AR max vel: 3.55 cm2
AV Area VTI: 4.39 cm2
AV Area mean vel: 3.51 cm2
AV Mean grad: 2 mmHg
AV Peak grad: 4.1 mmHg
Ao pk vel: 1.01 m/s
Area-P 1/2: 8.62 cm2
Height: 66 in
S' Lateral: 2.5 cm
Weight: 3199.32 oz

## 2022-10-07 LAB — PROCALCITONIN: Procalcitonin: 0.28 ng/mL

## 2022-10-07 LAB — BASIC METABOLIC PANEL
Anion gap: 8 (ref 5–15)
BUN: 9 mg/dL (ref 6–20)
CO2: 24 mmol/L (ref 22–32)
Calcium: 8.3 mg/dL — ABNORMAL LOW (ref 8.9–10.3)
Chloride: 106 mmol/L (ref 98–111)
Creatinine, Ser: 0.45 mg/dL (ref 0.44–1.00)
GFR, Estimated: >60 mL/min (ref >60)
Glucose, Bld: 159 mg/dL — ABNORMAL HIGH (ref 70–99)
Potassium: 3.1 mmol/L — ABNORMAL LOW (ref 3.5–5.1)
Sodium: 138 mmol/L (ref 135–145)

## 2022-10-07 LAB — CBC
HCT: 37.4 % (ref 36.0–46.0)
Hemoglobin: 11.8 g/dL — ABNORMAL LOW (ref 12.0–15.0)
MCH: 26.4 pg (ref 26.0–34.0)
MCHC: 31.6 g/dL (ref 30.0–36.0)
MCV: 83.7 fL (ref 80.0–100.0)
Platelets: 151 10*3/uL (ref 150–400)
RBC: 4.47 MIL/uL (ref 3.87–5.11)
RDW: 14.2 % (ref 11.5–15.5)
WBC: 6.3 10*3/uL (ref 4.0–10.5)
nRBC: 0 % (ref 0.0–0.2)

## 2022-10-07 LAB — HEPARIN LEVEL (UNFRACTIONATED)
Heparin Unfractionated: 0.71 IU/mL — ABNORMAL HIGH (ref 0.30–0.70)
Heparin Unfractionated: 0.47 IU/mL (ref 0.30–0.70)

## 2022-10-07 LAB — LEGIONELLA PNEUMOPHILA SEROGP 1 UR AG: L. pneumophila Serogp 1 Ur Ag: NEGATIVE

## 2022-10-07 LAB — GLUCOSE, CAPILLARY
Glucose-Capillary: 114 mg/dL — ABNORMAL HIGH (ref 70–99)
Glucose-Capillary: 152 mg/dL — ABNORMAL HIGH (ref 70–99)
Glucose-Capillary: 161 mg/dL — ABNORMAL HIGH (ref 70–99)
Glucose-Capillary: 161 mg/dL — ABNORMAL HIGH (ref 70–99)
Glucose-Capillary: 173 mg/dL — ABNORMAL HIGH (ref 70–99)
Glucose-Capillary: 180 mg/dL — ABNORMAL HIGH (ref 70–99)

## 2022-10-07 LAB — POTASSIUM: Potassium: 3.2 mmol/L — ABNORMAL LOW (ref 3.5–5.1)

## 2022-10-07 LAB — MYCOPLASMA PNEUMONIAE ANTIBODY, IGM: Mycoplasma pneumo IgM: <770 U/mL (ref 0–769)

## 2022-10-07 LAB — PHOSPHORUS
Phosphorus: 1.7 mg/dL — ABNORMAL LOW (ref 2.5–4.6)
Phosphorus: 1.6 mg/dL — ABNORMAL LOW (ref 2.5–4.6)

## 2022-10-07 MED ORDER — ALLOPURINOL 100 MG PO TABS
100.0000 mg | ORAL_TABLET | Freq: Every day | ORAL | Status: DC
  Administered 2022-10-07 – 2022-10-09 (×3): 100 mg via ORAL
  Filled 2022-10-07 (×4): qty 1

## 2022-10-07 MED ORDER — INSULIN ASPART 100 UNIT/ML IJ SOLN: 0.0000 [IU] | Freq: Every day | INTRAMUSCULAR | Status: DC

## 2022-10-07 MED ORDER — DULOXETINE HCL 30 MG PO CPEP
60.0000 mg | ORAL_CAPSULE | Freq: Every day | ORAL | Status: DC
  Filled 2022-10-07: qty 2

## 2022-10-07 MED ORDER — METOPROLOL SUCCINATE ER 50 MG PO TB24
50.0000 mg | ORAL_TABLET | Freq: Every day | ORAL | Status: DC
  Administered 2022-10-07: 50 mg via ORAL
  Filled 2022-10-07: qty 1

## 2022-10-07 MED ORDER — ALPRAZOLAM 0.5 MG PO TABS
0.5000 mg | ORAL_TABLET | Freq: Every evening | ORAL | Status: DC | PRN
  Administered 2022-10-07 – 2022-10-09 (×2): 0.5 mg via ORAL
  Filled 2022-10-07 (×2): qty 1

## 2022-10-07 MED ORDER — AMLODIPINE BESYLATE 10 MG PO TABS
10.0000 mg | ORAL_TABLET | Freq: Every day | ORAL | Status: DC
  Administered 2022-10-07 – 2022-10-09 (×3): 10 mg via ORAL
  Filled 2022-10-07 (×3): qty 1

## 2022-10-07 MED ORDER — INSULIN ASPART 100 UNIT/ML IJ SOLN
0.0000 [IU] | Freq: Three times a day (TID) | INTRAMUSCULAR | Status: DC
  Administered 2022-10-07 – 2022-10-08 (×2): 3 [IU] via SUBCUTANEOUS
  Administered 2022-10-08 (×2): 2 [IU] via SUBCUTANEOUS
  Administered 2022-10-09 – 2022-10-10 (×3): 3 [IU] via SUBCUTANEOUS
  Filled 2022-10-07 (×7): qty 1

## 2022-10-07 MED ORDER — METHYLPREDNISOLONE SODIUM SUCC 40 MG IJ SOLR
20.0000 mg | Freq: Two times a day (BID) | INTRAMUSCULAR | Status: DC
  Administered 2022-10-07 – 2022-10-08 (×2): 20 mg via INTRAVENOUS
  Filled 2022-10-07 (×2): qty 1

## 2022-10-07 MED ORDER — LETROZOLE 2.5 MG PO TABS
2.5000 mg | ORAL_TABLET | Freq: Every day | ORAL | Status: DC
  Administered 2022-10-07 – 2022-10-10 (×4): 2.5 mg via ORAL
  Filled 2022-10-07 (×4): qty 1

## 2022-10-07 MED ORDER — PREGABALIN 75 MG PO CAPS
150.0000 mg | ORAL_CAPSULE | Freq: Two times a day (BID) | ORAL | Status: DC
  Administered 2022-10-07 – 2022-10-10 (×6): 150 mg via ORAL
  Filled 2022-10-07 (×6): qty 2

## 2022-10-07 MED ORDER — LOSARTAN POTASSIUM 50 MG PO TABS
100.0000 mg | ORAL_TABLET | Freq: Every day | ORAL | Status: DC
  Administered 2022-10-07 – 2022-10-10 (×4): 100 mg via ORAL
  Filled 2022-10-07 (×5): qty 2

## 2022-10-07 MED ORDER — POTASSIUM PHOSPHATES 15 MMOLE/5ML IV SOLN
45.0000 mmol | Freq: Once | INTRAVENOUS | Status: AC
  Administered 2022-10-08: 45 mmol via INTRAVENOUS
  Filled 2022-10-07: qty 15

## 2022-10-07 MED ORDER — POTASSIUM PHOSPHATES 15 MMOLE/5ML IV SOLN
30.0000 mmol | Freq: Once | INTRAVENOUS | Status: AC
  Administered 2022-10-07: 30 mmol via INTRAVENOUS
  Filled 2022-10-07: qty 10

## 2022-10-07 MED ORDER — ATORVASTATIN CALCIUM 10 MG PO TABS
10.0000 mg | ORAL_TABLET | Freq: Every day | ORAL | Status: DC
  Administered 2022-10-07 – 2022-10-10 (×4): 10 mg via ORAL
  Filled 2022-10-07 (×4): qty 1

## 2022-10-07 MED ORDER — IPRATROPIUM-ALBUTEROL 0.5-2.5 (3) MG/3ML IN SOLN
3.0000 mL | Freq: Four times a day (QID) | RESPIRATORY_TRACT | Status: DC
  Administered 2022-10-07 – 2022-10-08 (×4): 3 mL via RESPIRATORY_TRACT
  Filled 2022-10-07 (×4): qty 3

## 2022-10-07 MED ORDER — POTASSIUM CHLORIDE CRYS ER 20 MEQ PO TBCR
20.0000 meq | EXTENDED_RELEASE_TABLET | Freq: Every day | ORAL | Status: DC
  Administered 2022-10-08: 20 meq via ORAL
  Filled 2022-10-07: qty 1

## 2022-10-07 NOTE — Progress Notes (Signed)
NAME:  Michelle Walker, MRN:  [id number], DOB:  [birth year], LOS: 1 ADMISSION DATE:  10/06/2022, CONSULTATION DATE:  10/06/2022 REFERRING MD:  Dr. Jacelyn Grip, CHIEF COMPLAINT:  Acute Respiratory Distress   Brief Pt Description / Synopsis:  61 y.o. female admitted with Acute Hypoxic Respiratory Failure in the setting of AECOPD due to Influenza A infection with suspected superimposed CAP, and Pulmonary Hypertension requiring BiPAP.    History of Present Illness:  Michelle Walker is a 61 year old female with a past medical history significant for breast cancer status post postlumpectomy and radiation therapy (approximately 1 year out from treatment), current smoker, hypertension, hyperlipidemia who presents to [facility] ED on 10/06/2022 due to acute respiratory distress.  The patient reports progressive shortness of breath, dry cough, and pleuritic chest pain and abdominal soreness associated with coughing since yesterday.  She denies chest pain/pressure, nasal congestion, sore throat, abdominal pain, nausea, vomiting, diarrhea, dysuria, leg pain or swelling.  She does endorse that her husband has been sick since Sunday.  Upon EMS arrival she was noted to be in respiratory distress and hypoxic with O2 saturations of 75% on room air.  She was placed on nonrebreather with improvement in O2 sats to 95%.   ED Course: Initial Vital Signs: Temperature 98.5 F, pulse 138, respiratory rate 36, blood pressure 143/80, SpO2 75% on room air with EMS Significant Labs: Sodium 132, potassium 3.2, glucose 127, AST 154, BNP 549, high-sensitivity troponin 342, lactic acid 1.8, procalcitonin 0.33, WBC 6.9 VBG: pH 7.35/pCO2 53/pO2 36/bicarb 29.3 Positive for influenza A Imaging Chest X-ray>>IMPRESSION: New right-greater-than-left bibasilar consolidations, concerning for multifocal infection. CT angio chest>>IMPRESSION: Fairly extensive patchy alveolar and interstitial infiltrates are seen in both lungs, more so on the right  side suggesting multifocal pneumonia. There is no pleural effusion. There is no evidence of central pulmonary artery embolism. Ectasia of main pulmonary artery suggests pulmonary arterial hypertension. Increased RV LV ratio suggests right heart dysfunction. There is no evidence of thoracic aortic dissection. There are slightly enlarged lymph nodes in mediastinum, possibly suggesting reactive hyperplasia. Left lobe of thyroid is larger than unusual. If clinically warranted, follow-up thyroid sonogram in nonemergent setting may be considered. Small hiatal hernia. Medications Administered: 2 L normal saline bolus, DuoNebs, azithromycin, ceftriaxone, heparin drip initiated  Given her work of breathing and respiratory rate in the 30's, she was placed on BiPAP.  PCCM is asked to admit for further workup and treatment.  Please see "significant hospital events" section below for full detailed hospital course.  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 Wide excision/ RT, T1a,N0. ER/PR pos, Her 2.neg. Tamoxifen x 6 months, d/c secondary to vasomotor sympotms.    Headache    Hypertension    Peripheral neuropathy    Personal history of radiation therapy 2016   RIGHT lumpectomy   Thyroid disease     Micro Data:  12/6: SARS-CoV-2 and influenza PCR>> + influenza A 12/6: Blood culture x 2>> NGTD 12/6: Sputum>> 12/6: Strep pneumo urinary antigen>>negative 12/6: Legionella urinary antigen>> 12/6: Mycoplasma pneumonia>> 12/6: MRSA PCR>>negative  Antimicrobials:  Azithromycin 12/6>> Ceftriaxone 12/6>> Tamiflu 12/6>>  Significant Hospital Events: Including procedures, antibiotic start and stop dates in addition to other pertinent events   12/6: Presented to ED with acute respiratory distress, placed on BiPAP.  Found to be positive for influenza with  imaging  concerning for multifocal pneumonia.  Placed on heparin drip for elevated troponins.  PCCM asked to admit, high risk for intubation 12/7: Respiratory status much improved, weaned off BiPAP to [place].  HS troponin peaked at 507  Interim History / Subjective:  -No significant events noted overnight -Afebrile, hemodynamically stable, no vasopressors -Respiratory status much improved, weaned off BiPAP to nasal cannula -Patient reports shortness of breath is much improved, requesting to eat and drink -Denies chest pain, abdominal pain, nausea, vomiting, diarrhea  Objective   Blood pressure (!) 129/93, pulse (!) 110, temperature 97.7 F (36.5 C), temperature source Axillary, resp. rate (!) 22, height _0  (1.676 m), weight 90.7 kg, last menstrual period 01/05/2017, SpO2 97 %.    FiO2 (%):  [40 %-50 %] 45 %   Intake/Output Summary (Last 24 hours) at 10/07/2022 0758 Last data filed at 10/07/2022 0600 Gross per 24 hour  Intake 1829.28 ml  Output 1925 ml  Net -95.72 ml    Filed Weights   10/06/22 1200 10/06/22 1251  Weight: 85.7 kg 90.7 kg    Examination: General: Acutely ill-appearing female, laying in bed, weaned off BiPAP, in no acute distress HENT: Atraumatic, normocephalic, neck supple, difficult to assess JVD due to body habitus Lungs: Clear breath sounds bilaterally, even, nonlabored, normal effort Cardiovascular: Regular rate and rhythm, S1-S2, no murmurs, rubs, gallops, 2+ distal pulses Abdomen: Obese, soft, nontender, nondistended, no guarding room tenderness, bowel sounds positive x 4 Extremities: Normal bulk and tone, no deformities, trace edema bilateral lower extremities, no cyanosis Neuro: Awake and alert, oriented x 4, moves all extremities to commands, no focal deficits, pupils PERRLA GU: deferred  Resolved Hospital Problem list     Assessment & Plan:   #Acute Hypoxic Respiratory Failure in the setting of AECOPD due to Influenza A infection with suspected  superimposed CAP, & Pulmonary Hypertension CT angio Chest on admission negative for PE, concerning for multifocal PNA, pulmonary arterial HTN, increased RV:LV ratio concerning for right heart dysfunction -Supplemental O2 as needed to maintain O2 sats 88 to 92% -BiPAP, wean as tolerated -Follow intermittent Chest X-ray & ABG as needed -Bronchodilators & Pulmicort nebs -IV Steroids -ABX as above -Diuresis as BP and renal function permits -Pulmonary toilet as able  #Sepsis due to Influenza A Infection & suspected superimposed Community Acquired Pneumonia (present on admission, met SIRS Criteria: HR 138, RR 36) -Monitor fever curve -Trend WBC's & Procalcitonin -Follow cultures as above -Continue empiric Azithromycin, Ceftriaxone,& Tamiflu pending cultures & sensitivities  #Elevated Troponin in setting of demand ischemia  -Continuous cardiac monitoring -Maintain MAP >65 -Cautious IV fluids -Vasopressors as needed to maintain MAP goal ~ currently not requiring -HS Troponin peaked at 507 -Heparin gtt ~ once ECHO has resulted, can considering discontinuing Heparin gtt -BNP elevated at 549 -Diuresis as BP and renal function permits -Echocardiogram pending -Consider Cardiology consult  #Mild Hyponatremia ~ resolved #Mild Hypokalemia -Monitor I&O's / urinary output -Follow BMP -Ensure adequate renal perfusion -Avoid nephrotoxic agents as able -Replace electrolytes as indicated -Pharmacy following for assistance with electrolyte replacement      Best Practice (right click and "Reselect all SmartList Selections" daily)   Diet/type: Regular diet DVT prophylaxis: systemic heparin GI prophylaxis: N/A Lines: N/A Foley:  N/A Code Status:  full code Last date of multidisciplinary goals of care discussion [10/07/22]  12/7: Patient and her family updated at bedside.  All questions answered.  Labs   CBC: Recent Labs  Lab 10/06/22 1019 10/06/22 1526 10/07/22 0428  WBC 6.9  5.9 6.3   NEUTROABS 6.3  --   --   HGB 12.6 12.1 11.8*  HCT 40.1 38.3 37.4  MCV 84.1 84.5 83.7  PLT 173 153 151     Basic Metabolic Panel: Recent Labs  Lab 10/06/22 1019 10/06/22 1526 10/07/22 0428  NA 132*  --  138  K 3.2*  --  3.1*  CL 97*  --  106  CO2 26  --  24  GLUCOSE 127*  --  159*  BUN 8  --  9  CREATININE 0.66 0.48 0.45  CALCIUM 9.0  --  8.3*  MG 2.0  --  2.3  PHOS  --   --  1.7*    GFR: Estimated Creatinine Clearance: 85.9 mL/min (by C-G formula based on SCr of 0.45 mg/dL). Recent Labs  Lab 10/06/22 1019 10/06/22 1312 10/06/22 1313 10/06/22 1526 10/07/22 0428  PROCALCITON  --  0.33  --   --  0.28  WBC 6.9  --   --  5.9 6.3  LATICACIDVEN 1.8  --  1.7  --   --      Liver Function Tests: Recent Labs  Lab 10/06/22 1019  AST 154*  ALT 31  ALKPHOS 57  BILITOT 0.8  PROT 7.9  ALBUMIN 3.7    No results for input(s): "LIPASE", "AMYLASE" in the last 168 hours. No results for input(s): "AMMONIA" in the last 168 hours.  ABG    Component Value Date/Time   HCO3 29.3 (H) 10/06/2022 1029   O2SAT 53 10/06/2022 1029     Coagulation Profile: Recent Labs  Lab 10/06/22 1313  INR 1.2     Cardiac Enzymes: No results for input(s): "CKTOTAL", "CKMB", "CKMBINDEX", "TROPONINI" in the last 168 hours.  HbA1C: No results found for: "HGBA1C"  CBG: Recent Labs  Lab 10/06/22 1510 10/07/22 0001 10/07/22 0516 10/07/22 0739  GLUCAP 111* 161* 161* 180*    Review of Systems:   Positives in BOLD: Gen: Denies fever, chills, weight change, fatigue, night sweats HEENT: Denies blurred vision, double vision, hearing loss, tinnitus, sinus congestion, rhinorrhea, sore throat, neck stiffness, dysphagia PULM: Denies shortness of breath, cough, sputum production, hemoptysis, wheezing CV: Denies chest pain, edema, orthopnea, paroxysmal nocturnal dyspnea, palpitations GI: Denies abdominal pain, nausea, vomiting, diarrhea, hematochezia, melena, constipation, change in bowel  habits GU: Denies dysuria, hematuria, polyuria, oliguria, urethral discharge Endocrine: Denies hot or cold intolerance, polyuria, polyphagia or appetite change Derm: Denies rash, dry skin, scaling or peeling skin change Heme: Denies easy bruising, bleeding, bleeding gums Neuro: Denies headache, numbness, weakness, slurred speech, loss of memory or consciousness   Past Medical History:  She,  has a past medical history of Anemia, Arthritis, Breast cancer ([place]) (08/2015), Headache, Hypertension, Peripheral neuropathy, Personal history of radiation therapy (2016), and Thyroid disease.   Surgical History:   Past Surgical History:  Procedure Laterality Date   ANTERIOR CRUCIATE LIGAMENT REPAIR Right    BREAST BIOPSY Right 07/17/2015   INVASIVE LOBULAR CARCINOMA, CLASSIC TYPE (1.5 MM), ARISING IN A    BREAST BIOPSY Left 09/19/2020   [place] bx 3 areas/ 1oc q clip/ 12 oc vision clip, axilla lymph node hydromark shape 3/ path pending   BREAST LUMPECTOMY Right 08/22/2015   BREAST LUMPECTOMY WITH SENTINEL LYMPH NODE BIOPSY Right 08/22/2015   Procedure: BREAST LUMPECTOMY WITH SENTINEL LYMPH NODE BX;  Surgeon: Christene Lye, MD;  Location: [facility];  Service: General;  Laterality: Right;   COLONOSCOPY W/ POLYPECTOMY  2014   COLONOSCOPY WITH PROPOFOL  N/A 04/16/2019   Procedure: COLONOSCOPY WITH BIOPSIES;  Surgeon: Lucilla Lame, MD;  Location: [facility];  Service: Endoscopy;  Laterality: N/A;   DILATION AND CURETTAGE OF UTERUS     20 years ago   LAPAROSCOPIC VAGINAL HYSTERECTOMY WITH SALPINGO OOPHORECTOMY Bilateral 04/12/2022   Procedure: LAPAROSCOPIC ASSISTED VAGINAL HYSTERECTOMY WITH SALPINGO OOPHORECTOMY;  Surgeon: Harlin Heys, MD;  Location: [facility];  Service: Gynecology;  Laterality: Bilateral;   MASTECTOMY Left    2021   NOVASURE ABLATION     POLYPECTOMY N/A 04/16/2019   Procedure: POLYPECTOMY;  Surgeon: Lucilla Lame, MD;  Location: [place];  Service:  Endoscopy;  Laterality: N/A;   PORTACATH PLACEMENT Right 12/01/2020   Procedure: INSERTION PORT-A-CATH;  Surgeon: [name], MD;  Location: [facility];  Service: General;  Laterality: Right;   TOTAL MASTECTOMY Left 05/06/2021   Procedure: modified radical mastectomy;  Surgeon: [name], MD;  Location: [facility];  Service: General;  Laterality: Left;  Provider requesting 1.5 hours / 90 minutes for procedure     Social History:   reports that she has been smoking cigarettes. She has a 30.00 pack-year smoking history. She has never used smokeless tobacco. She reports current alcohol use of about 2.0 standard drinks of alcohol per week. She reports that she does not use drugs.   Family History:  Her family history includes Cancer in her sister; Diabetes in her brother and sister; Stroke in her mother and sister. There is no history of Breast cancer, Ovarian cancer, Colon cancer, or Heart disease.   Allergies Allergies  Allergen Reactions   Triamcinolone Other (See Comments)    Hypopigmentation s/p steroid injection   Tape Rash    SURGICAL TAPE AFTER BREAST BIOPSY     Home Medications  Prior to Admission medications   Medication Sig Start Date End Date Taking? Authorizing Provider  allopurinol (ZYLOPRIM) 100 MG tablet Take 100 mg by mouth at bedtime.   Yes [provider]  amLODipine (NORVASC) 10 MG tablet Take 20 mg by mouth at bedtime.   Yes [provider]  atorvastatin (LIPITOR) 10 MG tablet Take 10 mg by mouth daily. 05/27/22  Yes [provider]  Calcium Carb-Cholecalciferol (CALCIUM 600+D3 PO) Take 1 tablet by mouth at bedtime.   Yes [provider]  DULoxetine (CYMBALTA) 60 MG capsule Take 1 capsule (60 mg total) by mouth daily. 06/11/22  Yes Borders, Kirt Boys, NP  hydrochlorothiazide (HYDRODIURIL) 25 MG tablet Take by mouth. 02/15/22  Yes [provider]  letrozole ([place]) 2.5 MG tablet TAKE 1 TABLET(2.5 MG) BY MOUTH DAILY.  START AFTER RADIATION IS OVER 09/27/22  Yes Sindy Guadeloupe, MD  losartan (COZAAR) 100 MG tablet Take 1 tablet by mouth daily. 08/26/19  Yes [provider]  metoprolol succinate (TOPROL-XL) 25 MG 24 hr tablet Take 50 mg by mouth at bedtime.   Yes [provider]  potassium chloride SA (KLOR-CON M) 20 MEQ tablet TAKE 1 TABLET(20 MEQ) BY MOUTH DAILY 10/01/22  Yes Verlon Au, NP  pregabalin (LYRICA) 75 MG capsule Take 2 capsules (150 mg total) by mouth 2 (two) times daily. 04/02/22  Yes Vaslow, Acey Lav, MD     Critical care time: 40 minutes     Darel Hong, AGACNP-BC [facility] Prefer epic messenger for cross cover needs If after hours, please call E-link

## 2022-10-07 NOTE — Consult Note (Signed)
PHARMACY CONSULT NOTE - FOLLOW UP  Pharmacy Consult for Electrolyte Monitoring and Replacement   Recent Labs: Potassium (mmol/L)  Date Value  10/07/2022 3.1 (L)  10/18/2013 3.6   Magnesium (mg/dL)  Date Value  10/07/2022 2.3   Calcium (mg/dL)  Date Value  10/07/2022 8.3 (L)   Albumin (g/dL)  Date Value  10/06/2022 3.7   Phosphorus (mg/dL)  Date Value  10/07/2022 1.7 (L)   Sodium (mmol/L)  Date Value  10/07/2022 138     Assessment: Pharmacy has been consulted to monitor and replace electrolytes in 61yo female with PMH of breast cancer s/p lumpectomy and radiation therapy(last treatment was approximately 1 year ago) presenting the the ED with dyspnea and dry cough for the last 2 days. EMS reports that her O2 level was 75% and in respiratory distress on room air. [name] was place on nonrebreather and subsequently her oxygen saturation has increased to 95%.  IVF: none  Goal of Therapy:  Electrolytes WNL  Plan:  - Kphos 46mol x 1 dose(contains approximately 413m potassium) - Plan to restart home dose of KCL 2046mtomorrow - Will recheck electrolytes with AM labs  WalPearla DubonnetharmD Clinical Pharmacist 10/07/2022 7:35 AM

## 2022-10-07 NOTE — Consult Note (Signed)
ANTICOAGULATION CONSULT NOTE - Follow Up Consult  Pharmacy Consult for heparin gtt Indication: chest pain/ACS w/ further w/u pending for PE  Allergies  Allergen Reactions   Triamcinolone Other (See Comments)    Hypopigmentation s/p steroid injection   Tape Rash    SURGICAL TAPE AFTER BREAST BIOPSY    Patient Measurements: Height: '5\' 6"'$  (167.6 cm) Weight: 90.7 kg (199 lb 15.3 oz) IBW/kg (Calculated) : 59.3 Heparin Dosing Weight: 79.1 kg  Vital Signs: Temp: 97.7 F (36.5 C) (12/07 0742) Temp Source: Axillary (12/07 0742) BP: 129/93 (12/07 0700) Pulse Rate: 110 (12/07 0742)  Labs: Recent Labs    10/06/22 1019 10/06/22 1313 10/06/22 1526 10/06/22 1915 10/06/22 2058 10/07/22 0052 10/07/22 0428  HGB 12.6  --  12.1  --   --   --  11.8*  HCT 40.1  --  38.3  --   --   --  37.4  PLT 173  --  153  --   --   --  151  APTT  --  33  --   --   --   --   --   LABPROT  --  14.8  --   --   --   --   --   INR  --  1.2  --   --   --   --   --   HEPARINUNFRC  --  <0.10*  --  0.55  --  0.71*  --   CREATININE 0.66  --  0.48  --   --   --  0.45  TROPONINIHS 342* 461*  --  507* 419*  --   --      Estimated Creatinine Clearance: 85.9 mL/min (by C-G formula based on SCr of 0.45 mg/dL).   Medications: Heparin Dosing Weight: 79.1 kg PTA: no AC/APT PTA Inpatient:  Assessment: 61 yo F w/ h/o breast cancer status postlumpectomy and radiation therapy who has been 1 year out from treatment, smoker, hyperlipidemia and hypertension who presents to the emergency department with dyspnea and dry cough for the last 2 days. Pt has c/o chest pain with Troponin 342 & pending w/u for PE. Pharmacy consulted for mgmt of heparin gtt  Date Time aPTT/HL Rate/Comment 12/6 1915 HL=0.55 Therapeutic x1 cont 1000u/hr      Baseline Labs: aPTT - pending; INR - pending; Hgb - 12.6; Plts - 173k  Goal of Therapy:  Heparin level 0.3-0.7 units/ml Monitor platelets by anticoagulation protocol: Yes   Plan:   12/6 1915 HL=0.55 Therapeutic 12/7 0052 HL=0.71 Elevated  12/7 0722 HL=0.47 Therapeutic x 1  - Continue current infusion rate at 900 units - Will recheck confirmatory HL in 6 hours - Continue to monitor H&H and platelets daily while on heparin gtt.  Sita Mangen A Rye Decoste 10/07/2022,8:06 AM

## 2022-10-07 NOTE — Consult Note (Signed)
ANTICOAGULATION CONSULT NOTE - Follow Up Consult  Pharmacy Consult for heparin gtt Indication: chest pain/ACS w/ further w/u pending for PE  Allergies  Allergen Reactions   Triamcinolone Other (See Comments)    Hypopigmentation s/p steroid injection   Tape Rash    SURGICAL TAPE AFTER BREAST BIOPSY    Patient Measurements: Height: '5\' 6"'$  (167.6 cm) Weight: 90.7 kg (199 lb 15.3 oz) IBW/kg (Calculated) : 59.3 Heparin Dosing Weight: 79.1 kg  Vital Signs: Temp: 98.7 F (37.1 C) (12/06 2000) Temp Source: Axillary (12/06 2000) BP: 141/92 (12/06 2200) Pulse Rate: 127 (12/06 2200)  Labs: Recent Labs    10/06/22 1019 10/06/22 1313 10/06/22 1526 10/06/22 1915 10/06/22 2058 10/07/22 0052  HGB 12.6  --  12.1  --   --   --   HCT 40.1  --  38.3  --   --   --   PLT 173  --  153  --   --   --   APTT  --  33  --   --   --   --   LABPROT  --  14.8  --   --   --   --   INR  --  1.2  --   --   --   --   HEPARINUNFRC  --  <0.10*  --  0.55  --  0.71*  CREATININE 0.66  --  0.48  --   --   --   TROPONINIHS 342* 461*  --  507* 419*  --      Estimated Creatinine Clearance: 85.9 mL/min (by C-G formula based on SCr of 0.48 mg/dL).   Medications: Heparin Dosing Weight: 79.1 kg PTA: no AC/APT PTA Inpatient:  Assessment: 61 yo F w/ h/o breast cancer status postlumpectomy and radiation therapy who has been 1 year out from treatment, smoker, hyperlipidemia and hypertension who presents to the emergency department with dyspnea and dry cough for the last 2 days. Pt has c/o chest pain with Troponin 342 & pending w/u for PE. Pharmacy consulted for mgmt of heparin gtt  Date Time aPTT/HL Rate/Comment 12/6 1915 HL=0.55 Therapeutic x1 cont 1000u/hr      Baseline Labs: aPTT - pending; INR - pending; Hgb - 12.6; Plts - 173k  Goal of Therapy:  Heparin level 0.3-0.7 units/ml Monitor platelets by anticoagulation protocol: Yes   Plan:  12/6 1915  HL=0.55 Therapeutic 12/7 0052  HL=0.71    Elevated    - Will decrease heparin drip to 900 units/hr and recheck HL 6 hrs after rate change  Continue to monitor H&H and platelets daily while on heparin gtt.  Shalise Rosado D 10/07/2022,1:14 AM

## 2022-10-07 NOTE — Progress Notes (Signed)
*  PRELIMINARY RESULTS* Echocardiogram 2D Echocardiogram has been performed.  Michelle Walker 10/07/2022, 12:03 PM

## 2022-10-08 DIAGNOSIS — J9601 Acute respiratory failure with hypoxia: Secondary | ICD-10-CM | POA: Diagnosis not present

## 2022-10-08 DIAGNOSIS — J189 Pneumonia, unspecified organism: Secondary | ICD-10-CM | POA: Diagnosis not present

## 2022-10-08 DIAGNOSIS — R000 Tachycardia, unspecified: Secondary | ICD-10-CM

## 2022-10-08 DIAGNOSIS — J111 Influenza due to unidentified influenza virus with other respiratory manifestations: Secondary | ICD-10-CM | POA: Diagnosis not present

## 2022-10-08 HISTORY — DX: Pneumonia, unspecified organism: J18.9

## 2022-10-08 HISTORY — DX: Influenza due to unidentified influenza virus with other respiratory manifestations: J11.1

## 2022-10-08 LAB — BASIC METABOLIC PANEL
Anion gap: 6 (ref 5–15)
BUN: 10 mg/dL (ref 6–20)
CO2: 25 mmol/L (ref 22–32)
Calcium: 8.3 mg/dL — ABNORMAL LOW (ref 8.9–10.3)
Chloride: 107 mmol/L (ref 98–111)
Creatinine, Ser: 0.52 mg/dL (ref 0.44–1.00)
GFR, Estimated: >60 mL/min (ref >60)
Glucose, Bld: 144 mg/dL — ABNORMAL HIGH (ref 70–99)
Potassium: 3.5 mmol/L (ref 3.5–5.1)
Sodium: 138 mmol/L (ref 135–145)

## 2022-10-08 LAB — GLUCOSE, CAPILLARY
Glucose-Capillary: 130 mg/dL — ABNORMAL HIGH (ref 70–99)
Glucose-Capillary: 164 mg/dL — ABNORMAL HIGH (ref 70–99)
Glucose-Capillary: 166 mg/dL — ABNORMAL HIGH (ref 70–99)
Glucose-Capillary: 184 mg/dL — ABNORMAL HIGH (ref 70–99)

## 2022-10-08 LAB — CBC
HCT: 36.8 % (ref 36.0–46.0)
Hemoglobin: 11.7 g/dL — ABNORMAL LOW (ref 12.0–15.0)
MCH: 26.5 pg (ref 26.0–34.0)
MCHC: 31.8 g/dL (ref 30.0–36.0)
MCV: 83.4 fL (ref 80.0–100.0)
Platelets: 202 10*3/uL (ref 150–400)
RBC: 4.41 MIL/uL (ref 3.87–5.11)
RDW: 14 % (ref 11.5–15.5)
WBC: 11.8 10*3/uL — ABNORMAL HIGH (ref 4.0–10.5)
nRBC: 0 % (ref 0.0–0.2)

## 2022-10-08 LAB — HEMOGLOBIN A1C
Hgb A1c MFr Bld: 6.2 % — ABNORMAL HIGH (ref 4.8–5.6)
Mean Plasma Glucose: 131 mg/dL

## 2022-10-08 LAB — PROCALCITONIN: Procalcitonin: 0.17 ng/mL

## 2022-10-08 LAB — MAGNESIUM: Magnesium: 2.7 mg/dL — ABNORMAL HIGH (ref 1.7–2.4)

## 2022-10-08 LAB — PHOSPHORUS: Phosphorus: 3.3 mg/dL (ref 2.5–4.6)

## 2022-10-08 MED ORDER — METHYLPREDNISOLONE SODIUM SUCC 40 MG IJ SOLR
20.0000 mg | Freq: Every day | INTRAMUSCULAR | Status: DC
  Administered 2022-10-08 – 2022-10-10 (×3): 20 mg via INTRAVENOUS
  Filled 2022-10-08 (×3): qty 1

## 2022-10-08 MED ORDER — GUAIFENESIN ER 600 MG PO TB12
600.0000 mg | ORAL_TABLET | Freq: Two times a day (BID) | ORAL | Status: DC | PRN
  Administered 2022-10-08: 600 mg via ORAL
  Filled 2022-10-08: qty 1

## 2022-10-08 MED ORDER — IPRATROPIUM-ALBUTEROL 0.5-2.5 (3) MG/3ML IN SOLN
3.0000 mL | Freq: Three times a day (TID) | RESPIRATORY_TRACT | Status: DC
  Administered 2022-10-08 – 2022-10-10 (×6): 3 mL via RESPIRATORY_TRACT
  Filled 2022-10-08 (×6): qty 3

## 2022-10-08 MED ORDER — SODIUM CHLORIDE 0.9 % IV SOLN
3.0000 g | Freq: Four times a day (QID) | INTRAVENOUS | Status: DC
  Administered 2022-10-08 – 2022-10-10 (×9): 3 g via INTRAVENOUS
  Filled 2022-10-08 (×10): qty 8

## 2022-10-08 MED ORDER — METOPROLOL SUCCINATE ER 100 MG PO TB24
100.0000 mg | ORAL_TABLET | Freq: Every day | ORAL | Status: DC
  Administered 2022-10-08 – 2022-10-10 (×3): 100 mg via ORAL
  Filled 2022-10-08 (×3): qty 1

## 2022-10-08 MED ORDER — HYDROCHLOROTHIAZIDE 25 MG PO TABS
25.0000 mg | ORAL_TABLET | Freq: Every day | ORAL | Status: DC
  Administered 2022-10-08 – 2022-10-10 (×3): 25 mg via ORAL
  Filled 2022-10-08 (×3): qty 1

## 2022-10-08 MED ORDER — ALBUTEROL SULFATE (2.5 MG/3ML) 0.083% IN NEBU
2.5000 mg | INHALATION_SOLUTION | RESPIRATORY_TRACT | Status: DC | PRN
  Administered 2022-10-08: 2.5 mg via RESPIRATORY_TRACT
  Filled 2022-10-08: qty 3

## 2022-10-08 NOTE — Progress Notes (Addendum)
PROGRESS NOTE  KRUTI HORACEK    DOB: 1961-02-28, 60 y.o.  WLS:[id number]    Code Status: Full Code   DOA: 10/06/2022   LOS: 2   Brief hospital course  Michelle Walker is a 61 y.o. female with a PMH significant for breast cancer status post postlumpectomy and radiation therapy (approximately 1 year out from treatment), current smoker, hypertension, hyperlipidemia.  They presented from home to the ED on 10/06/2022 with Acute Hypoxic Respiratory Failure in the setting of AECOPD due to Influenza A infection with suspected superimposed CAP, and Pulmonary Hypertension requiring BiPAP.   In the ED, it was found that they had oxygen desaturation and increased WOB requiring bipap. Temperature 98.5 F, pulse 138, respiratory rate 36, blood pressure 143/80, SpO2 75% on room air with EMS  Significant findings included Sodium 132, potassium 3.2, glucose 127, AST 154, BNP 549, high-sensitivity troponin 342, lactic acid 1.8, procalcitonin 0.33, WBC 6.9 VBG: pH 7.35/pCO2 53/pO2 36/bicarb 29.3 Positive for influenza A. Chest X-ray>>IMPRESSION: New right-greater-than-left bibasilar consolidations, concerning for multifocal infection. CT angio chest>>IMPRESSION: Fairly extensive patchy alveolar and interstitial infiltrates are seen in both lungs, more so on the right side suggesting multifocal pneumonia. There is no pleural effusion. There is no evidence of central pulmonary artery embolism. Ectasia of main pulmonary artery suggests pulmonary arterial hypertension. Increased RV LV ratio suggests right heart dysfunction. There is no evidence of thoracic aortic dissection. There are slightly enlarged lymph nodes in mediastinum, possibly suggesting reactive hyperplasia. Left lobe of thyroid is larger than unusual. If clinically warranted, follow-up thyroid sonogram in nonemergent setting may be considered. Small hiatal hernia.  They were initially treated with 2 L normal saline bolus, DuoNebs,  azithromycin, ceftriaxone, heparin drip initiated .   Patient was admitted to [facility] service for further workup and management of Acute hypoxic respiratory failure as outlined in detail below.  10/08/22 -transferred to [facility] service. Currently on 4Lnc  Assessment & Plan  Principal Problem:   Respiratory failure ([place])  Acute Hypoxic Respiratory Failure in the setting of AECOPD due to Influenza A infection with suspected superimposed CAP, & Pulmonary Hypertension CT angio Chest on admission negative for PE, concerning for multifocal PNA, pulmonary arterial HTN, increased RV:LV ratio concerning for right heart dysfunction -Supplemental O2 as needed to maintain O2 sats 88 to 92% -wean to room air as tolerated -Bronchodilators & Pulmicort nebs -IV Steroids -ABX changed to unasyn - PT/OT - supportive/symptomatic care   Sepsis due to Influenza A Infection & suspected superimposed CAP (present on admission, met SIRS Criteria: HR 138, RR 36). Sepsis criteria have resolved. Procalcitonin low and points to likely viral mediator. BxCx NGTD -Monitor fever curve -Follow cultures -Continue Tamiflu and unasyn pending cultures & sensitivities   Elevated Troponin in setting of demand ischemia- flattened and declined. No chest pain. No events on monitor. Echo is unremarkable -Continuous cardiac monitoring -Maintain MAP >65  Tachycardia- HR up to 120s. Patient is asymptomatic and sounds regular on exam. May be related to SABA use, inflammation - ECG ordered - continue telemetry - review med contributions   #Mild Hyponatremia -resolved #Mild Hypokalemia- resolved -Monitor I&O's / urinary output -Follow BMP -Ensure adequate renal perfusion -Avoid nephrotoxic agents as able -Replace electrolytes as indicated  Body mass index is 34.27 kg/m.  VTE ppx: SCDs Start: 10/06/22 1415  Diet:     Diet   Diet heart healthy/carb modified Room service appropriate? Yes; Fluid consistency: Thin    Consultants: CCM  Subjective 10/08/22  Pt reports having significant cough but non-productive. Improved respiratory status. Denies chest pain   Objective   Vitals:   10/07/22 2017 10/07/22 2030 10/08/22 0533 10/08/22 0555  BP:  (!) 154/96 (!) 141/89   Pulse:  (!) 123 (!) 111   Resp:      Temp:   98.5 F (36.9 C)   TempSrc:   Oral   SpO2: 98% 100% 100%   Weight:    96.3 kg  Height:        Intake/Output Summary (Last 24 hours) at 10/08/2022 0805 Last data filed at 10/08/2022 0300 Gross per 24 hour  Intake 1936.72 ml  Output 700 ml  Net 1236.72 ml   Filed Weights   10/06/22 1200 10/06/22 1251 10/08/22 0555  Weight: 85.7 kg 90.7 kg 96.3 kg     Physical Exam:  General: awake, alert, NAD HEENT: atraumatic, clear conjunctiva, anicteric sclera, MMM, hearing grossly normal Respiratory: normal respiratory effort. Cardiovascular: quick capillary refill, normal S1/S2, RRR, no JVD, murmurs Nervous: A&O x3. no gross focal neurologic deficits, normal speech Extremities: moves all equally, no edema, normal tone Skin: dry, intact, normal temperature, normal color. No rashes, lesions or ulcers on exposed skin Psychiatry: normal mood, congruent affect  Labs   I have personally reviewed the following labs and imaging studies CBC    Component Value Date/Time   WBC 11.8 (H) 10/08/2022 0604   RBC 4.41 10/08/2022 0604   HGB 11.7 (L) 10/08/2022 0604   HCT 36.8 10/08/2022 0604   PLT 202 10/08/2022 0604   MCV 83.4 10/08/2022 0604   MCH 26.5 10/08/2022 0604   MCHC 31.8 10/08/2022 0604   RDW 14.0 10/08/2022 0604   LYMPHSABS 0.3 (L) 10/06/2022 1019   MONOABS 0.3 10/06/2022 1019   EOSABS 0.0 10/06/2022 1019   BASOSABS 0.0 10/06/2022 1019      Latest Ref Rng & Units 10/08/2022    6:04 AM 10/07/2022    8:59 PM 10/07/2022    4:28 AM  BMP  Glucose 70 - 99 mg/dL 144   159   BUN 6 - 20 mg/dL 10   9   Creatinine 0.44 - 1.00 mg/dL 0.52   0.45   Sodium 135 - 145 mmol/L 138   138    Potassium 3.5 - 5.1 mmol/L 3.5  3.2  3.1   Chloride 98 - 111 mmol/L 107   106   CO2 22 - 32 mmol/L 25   24   Calcium 8.9 - 10.3 mg/dL 8.3   8.3     ECHOCARDIOGRAM COMPLETE  Result Date: 10/07/2022    ECHOCARDIOGRAM REPORT   Patient Name:   ALAYSIA LIGHTLE Date of Exam: 10/07/2022 Medical Rec #:  [id number]       Height:       66.0 in Accession #:    [id number]      Weight:       200.0 lb Date of Birth:  1961-03-16      BSA:          2.000 m Patient Age:    45 years        BP:           Not listed in chart/Not listed in                                               chart  mmHg Patient Gender: F               HR:           Not listed in chart bpm. Exam Location:  [facility] Procedure: Cardiac Doppler and Color Doppler Indications:     Elevated Troponin  History:         Patient has prior history of Echocardiogram examinations, most                  recent 11/10/2020. Risk Factors:Hypertension. Breast cancer.  Sonographer:     Sherrie Sport Referring Phys:  [id number] Bradly Bienenstock Diagnosing Phys: Serafina Royals MD  Sonographer Comments: Suboptimal apical window. Image acquisition challenging due to mastectomy. IMPRESSIONS  1. Left ventricular ejection fraction, by estimation, is 60 to 65%. The left ventricle has normal function. The left ventricle has no regional wall motion abnormalities. Left ventricular diastolic parameters were normal.  2. Right ventricular systolic function is normal. The right ventricular size is normal.  3. The mitral valve is normal in structure. Trivial mitral valve regurgitation.  4. The aortic valve is normal in structure. Aortic valve regurgitation is trivial. FINDINGS  Left Ventricle: Left ventricular ejection fraction, by estimation, is 60 to 65%. The left ventricle has normal function. The left ventricle has no regional wall motion abnormalities. The left ventricular internal cavity size was normal in size. There is  no left ventricular hypertrophy. Left ventricular diastolic parameters  were normal. Right Ventricle: The right ventricular size is normal. No increase in right ventricular wall thickness. Right ventricular systolic function is normal. Left Atrium: Left atrial size was normal in size. Right Atrium: Right atrial size was normal in size. Pericardium: There is no evidence of pericardial effusion. Mitral Valve: The mitral valve is normal in structure. Trivial mitral valve regurgitation. Tricuspid Valve: The tricuspid valve is normal in structure. Tricuspid valve regurgitation is trivial. Aortic Valve: The aortic valve is normal in structure. Aortic valve regurgitation is trivial. Aortic valve mean gradient measures 2.0 mmHg. Aortic valve peak gradient measures 4.1 mmHg. Aortic valve area, by VTI measures 4.39 cm. Pulmonic Valve: The pulmonic valve was normal in structure. Pulmonic valve regurgitation is trivial. Aorta: The aortic root and ascending aorta are structurally normal, with no evidence of dilitation. IAS/Shunts: No atrial level shunt detected by color flow Doppler.  LEFT VENTRICLE PLAX 2D LVIDd:         3.40 cm   Diastology LVIDs:         2.50 cm   LV e' medial:    6.31 cm/s LV PW:         1.10 cm   LV E/e' medial:  9.8 LV IVS:        1.10 cm   LV e' lateral:   10.30 cm/s LVOT diam:     2.20 cm   LV E/e' lateral: 6.0 LV SV:         63 LV SV Index:   31 LVOT Area:     3.80 cm  RIGHT VENTRICLE RV Basal diam:  3.40 cm RV Mid diam:    2.50 cm RV S prime:     11.50 cm/s TAPSE (M-mode): 1.9 cm LEFT ATRIUM           Index        RIGHT ATRIUM           Index LA diam:      4.10 cm 2.05 cm/m   RA Area:  15.60 cm LA Vol (A2C): 51.6 ml 25.80 ml/m  RA Volume:   41.00 ml  20.50 ml/m LA Vol (A4C): 18.7 ml 9.35 ml/m  AORTIC VALVE AV Area (Vmax):    3.55 cm AV Area (Vmean):   3.51 cm AV Area (VTI):     4.39 cm AV Vmax:           100.95 cm/s AV Vmean:          66.200 cm/s AV VTI:            0.143 m AV Peak Grad:      4.1 mmHg AV Mean Grad:      2.0 mmHg LVOT Vmax:         94.20 cm/s  LVOT Vmean:        61.200 cm/s LVOT VTI:          0.165 m LVOT/AV VTI ratio: 1.15  AORTA Ao Root diam: 3.33 cm MITRAL VALVE                TRICUSPID VALVE MV Area (PHT): 8.62 cm     TR Peak grad:   19.5 mmHg MV Decel Time: 88 msec      TR Vmax:        221.00 cm/s MV E velocity: 61.80 cm/s MV A velocity: 123.00 cm/s  SHUNTS MV E/A ratio:  0.50         Systemic VTI:  0.16 m                             Systemic Diam: 2.20 cm Serafina Royals MD Electronically signed by Serafina Royals MD Signature Date/Time: 10/07/2022/1:22:19 PM    Final    CT Angio Chest PE W/Cm &/Or Wo Cm  Result Date: 10/06/2022 CLINICAL DATA:  High clinical suspicion for pulmonary embolism, respiratory distress EXAM: CT ANGIOGRAPHY CHEST WITH CONTRAST TECHNIQUE: Multidetector CT imaging of the chest was performed using the standard protocol during bolus administration of intravenous contrast. Multiplanar CT image reconstructions and MIPs were obtained to evaluate the vascular anatomy. RADIATION DOSE REDUCTION: This exam was performed according to the departmental dose-optimization program which includes automated exposure control, adjustment of the mA and/or kV according to patient size and/or use of iterative reconstruction technique. CONTRAST:  56m OMNIPAQUE IOHEXOL 350 MG/ML SOLN COMPARISON:  CT done on 10/28/2020, chest radiograph done earlier today FINDINGS: Cardiovascular: There is homogeneous enhancement in thoracic aorta. There are no intraluminal filling defects in central pulmonary artery branches. Evaluation of small peripheral branches is limited by infiltrates. There is ectasia of main pulmonary artery measuring 3.6 cm suggesting pulmonary arterial hypertension. Heart is enlarged in size. RV LV ratio is 1.3. Mediastinum/Nodes: There are slightly enlarged lymph nodes in mediastinum measuring up to 1.3 cm in short axis. Mediastinal lymph nodes appear slightly more prominent in size. Left lobe of thyroid is larger than right with its  inferior margin extending behind the left sternoclavicular joint. Beam hardening artifacts caused by dense contrast limits evaluation of thyroid. Lungs/Pleura: There are new patchy infiltrates in both mid and both lower lung fields, more so in right lower lung field suggesting multifocal pneumonia. There is no pleural effusion or pneumothorax. Upper Abdomen: Small hiatal hernia is seen. Musculoskeletal: No acute findings are seen. Review of the MIP images confirms the above findings. IMPRESSION: Fairly extensive patchy alveolar and interstitial infiltrates are seen in both lungs, more so on the right side suggesting multifocal pneumonia. There  is no pleural effusion. There is no evidence of central pulmonary artery embolism. Ectasia of main pulmonary artery suggests pulmonary arterial hypertension. Increased RV LV ratio suggests right heart dysfunction. There is no evidence of thoracic aortic dissection. There are slightly enlarged lymph nodes in mediastinum, possibly suggesting reactive hyperplasia. Left lobe of thyroid is larger than unusual. If clinically warranted, follow-up thyroid sonogram in nonemergent setting may be considered. Small hiatal hernia. Electronically Signed   By: Elmer Picker M.D.   On: 10/06/2022 13:13   DG Chest Port 1 View  Result Date: 10/06/2022 CLINICAL DATA:  Dyspnea and cough EXAM: PORTABLE CHEST 1 VIEW COMPARISON:  Chest x-ray dated December 01, 2020 FINDINGS: Cardiac and mediastinal contours are unchanged. New right-greater-than-left bibasilar consolidations. Possible associated small right pleural effusion. No evidence of pneumothorax. IMPRESSION: New right-greater-than-left bibasilar consolidations, concerning for multifocal infection. Recommend follow-up PA and lateral chest x-ray in 6-8 weeks to ensure resolution. Electronically Signed   By: Yetta Glassman M.D.   On: 10/06/2022 10:47    Disposition Plan & Communication  Patient status: Inpatient  Admitted From:  Home Planned disposition location: Home Anticipated discharge date: 12/10 pending wean to room air  Family Communication: son at bedside    Author: Richarda Osmond, DO [facility] 10/08/2022, 8:05 AM   Available by Epic secure chat 7AM-7PM. If 7PM-7AM, please contact night-coverage.  [facility] contact information found on [email or web address].

## 2022-10-08 NOTE — Progress Notes (Signed)
PHARMACY CONSULT NOTE - FOLLOW UP  Pharmacy Consult for Electrolyte Monitoring and Replacement   Recent Labs: Potassium (mmol/L)  Date Value  10/08/2022 3.5  10/18/2013 3.6   Magnesium (mg/dL)  Date Value  10/08/2022 2.7 (H)   Calcium (mg/dL)  Date Value  10/08/2022 8.3 (L)   Albumin (g/dL)  Date Value  10/06/2022 3.7   Phosphorus (mg/dL)  Date Value  10/08/2022 3.3   Sodium (mmol/L)  Date Value  10/08/2022 138     Assessment: Pharmacy has been consulted to monitor and replace electrolytes in 61yo female with PMH of breast cancer s/p lumpectomy and radiation therapy(last treatment was approximately 1 year ago) presenting the the ED with dyspnea and dry cough for the last 2 days. EMS reports that her O2 level was 75% and in respiratory distress on room air. [name] was place on nonrebreather and subsequently her oxygen saturation has increased to 95%.  IVF: none  Goal of Therapy:  Electrolytes WNL  Plan:  -Mg slightly above goal will defer any adjustment to MD. All other electrolytes WNL and K 3.5. Patient home po Kcl stated 12/8. -Patient transferred to 2A, will dc CCM consult and follow AM to recommend replacement as needed.  Dustina Scoggin Rodriguez-Guzman PharmD, BCPS 10/08/2022 7:27 AM

## 2022-10-08 NOTE — Consult Note (Signed)
Pharmacy Antibiotic Note  Michelle Walker is a 61 y.o. female admitted on 10/06/2022 with pneumonia.  Pharmacy has been consulted for Unasyn dosing.  Plan: Unasyn 3gm q 6hrs  Height: '5\' 6"'$  (167.6 cm) Weight: 96.3 kg (212 lb 4.9 oz) IBW/kg (Calculated) : 59.3  Temp (24hrs), Avg:98.3 F (36.8 C), Min:97.5 F (36.4 C), Max:99 F (37.2 C)  Recent Labs  Lab 10/06/22 1019 10/06/22 1313 10/06/22 1526 10/07/22 0428 10/08/22 0604  WBC 6.9  --  5.9 6.3 11.8*  CREATININE 0.66  --  0.48 0.45 0.52  LATICACIDVEN 1.8 1.7  --   --   --     Estimated Creatinine Clearance: 88.6 mL/min (by C-G formula based on SCr of 0.52 mg/dL).    Allergies  Allergen Reactions   Triamcinolone Other (See Comments)    Hypopigmentation s/p steroid injection   Tape Rash    SURGICAL TAPE AFTER BREAST BIOPSY    Antimicrobials this admission: Azithromycin 12/6 >> 12/8 Ceftriaxone 12/7 >> 12/8 Unasyn 12/8 >>  Dose adjustments this admission:n/a  Microbiology results: 12/6 BCx: NGTD 12/6 Stre pneumo urine: negative 12/6 Legionella: negative 12/6 MRSA PCR:  not detected 12/6 Influenza A PRN : POSITIVE  Thank you for allowing pharmacy to be a part of this patient's care.  Colbert Curenton Rodriguez-Guzman PharmD, BCPS 10/08/2022 9:51 AM .

## 2022-10-08 NOTE — Evaluation (Signed)
Occupational Therapy Evaluation Patient Details Name: Michelle Walker MRN: [id number] DOB: [birth year] Today's Date: 10/08/2022   History of Present Illness Michelle Walker is a 61 year old female with a past medical history significant for breast cancer status post postlumpectomy and radiation therapy (approximately 1 year out from treatment), current smoker, hypertension, hyperlipidemia who presents to [facility] ED on 10/06/2022 due to acute respiratory distress. The patient reports progressive shortness of breath, dry cough, and pleuritic chest pain and abdominal soreness associated with coughing since yesterday.  She denies chest pain/pressure, nasal congestion, sore throat, abdominal pain, nausea, vomiting, diarrhea, dysuria, leg pain or swelling. She does endorse that her husband has been sick since Sunday. Upon EMS arrival she was noted to be in respiratory distress and hypoxic with O2 saturations of 75% on room air. She was placed on nonrebreather with improvement in O2 sats to 95%.   Clinical Impression   Michelle Walker presents with generalized weakness, limited endurance, SOB, and mild hip pain. She lives with her spouse and daughter, in a single story home, 4-5 steps to enter. Pt has been IND in ADL performance, working in a physically demanding job that routinely requires her to lift 45-50 pound boxes. She reports 4 falls in previous year, and has recently begun using a SPC for ambulation. She reports, however, that this has not worked well for her, that she feels it is more likely to "get in the way" than to be a help, and that she is not able to use the cane at her workplace. She states she may prefer using a RW. During today's evaluation, pt endorses mild chronic hip pain. She is able to perform bed mobility, transfers, grooming, toileting, all with Mod I-SUPV for safety. She reports she feels at or close to her baseline level of fxl mobility, although she requires increased time at present, 2/2 fatigue.  Pt is now on 4L O2 (decreased by respiratory therapist from 5L ~ 1 hour earlier), and is maintaining O2 levels in lower 90s. HR in 110s at rest. Provided educ re: falls prevention, home and routines modifications, and smoking cessation or decrease. 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 Pt endorses understanding of importance of reducing or quitting smoking, and is able to generate ideas as to how she might be able to reduce her nicotine consumption. Pt and therapist in agreement that no additional OT services are required at this time. Will sign off.     Recommendations for follow up therapy are one component of a multi-disciplinary discharge planning process, led by the attending physician.  Recommendations may be updated based on patient status, additional functional criteria and insurance authorization.   Follow Up Recommendations  No OT follow up     Assistance Recommended at Discharge PRN  Patient can return home with the following      Functional Status Assessment  Patient has had a recent decline in their functional status and demonstrates the ability to make significant improvements in function in a reasonable and predictable amount of time.  Equipment Recommendations  Other (comment) (PT to evaluate for RW)    Recommendations for Other Services       Precautions / Restrictions Precautions Precautions: Fall Precaution Comments: Mod fall risk Restrictions Weight Bearing Restrictions: No      Mobility Bed Mobility Overal bed mobility: Modified Independent  Transfers Overall transfer level: Needs assistance Equipment used: Rolling walker (2 wheels) Transfers: Sit to/from Stand Sit to Stand: Supervision                  Balance Overall balance assessment: Mild deficits observed, not formally tested                                          ADL either performed or assessed with clinical judgement   ADL Overall ADL's : At baseline;Needs assistance/impaired Eating/Feeding: Modified independent   Grooming: Wash/dry hands;Wash/dry face;Standing;Brushing hair                   Toilet Transfer: Warehouse manager and Hygiene: Modified independent         General ADL Comments: Pt able to complete ADLs INDly, although with increased time and effort at present, 2/2 fatigue     Vision         Perception     Praxis      Pertinent Vitals/Pain Pain Assessment Pain Assessment: Faces Faces Pain Scale: Hurts a little bit Pain Location: chronic hip pain Pain Descriptors / Indicators: Sore, Aching Pain Intervention(s): Monitored during session, Repositioned     Hand Dominance Right   Extremity/Trunk Assessment Upper Extremity Assessment Upper Extremity Assessment: Overall WFL for tasks assessed   Lower Extremity Assessment Lower Extremity Assessment: Overall WFL for tasks assessed       Communication Communication Communication: No difficulties   Cognition Arousal/Alertness: Awake/alert Behavior During Therapy: WFL for tasks assessed/performed Overall Cognitive Status: Within Functional Limits for tasks assessed                                 General Comments: pleasant and cooperative     General Comments       Exercises Other Exercises Other Exercises: Educ re: safe DME use, falls prevention, home and routines modifications, smoking cessation   Shoulder Instructions      Home Living Family/patient expects to be discharged to:: Private residence Living Arrangements: Spouse/significant other;Other relatives (Husband, daughter, and her three grandchildren) Available Help at Discharge: Family;Available 24 hours/day Type of Home: House Home Access: Stairs to enter [employer] of Steps: 4-5 Entrance  Stairs-Rails: Can reach both;Left;Right Home Layout: One level     Bathroom Shower/Tub: Walk-in shower;Tub/shower unit   Bathroom Toilet: Handicapped height     Home Equipment: Cane - single point          Prior Functioning/Environment Prior Level of Function : Independent/Modified Independent             Mobility Comments: Recently started using a SPC, 2/2 hip pain. Reports ~ 3-4 falls in previous 12 months ADLs Comments: Works in physically demanding job (FT). Independent with all aspects of care and mobility except using cane at times, drives. Daughter who lives w/ pt handles most shopping, cooking, cleaning, w/ pt assisting as needed.        OT Problem List: Pain;Decreased activity tolerance;Cardiopulmonary status limiting activity;Decreased strength      OT Treatment/Interventions:      OT Goals(Current goals can be found in the care plan section) Acute Rehab OT Goals Patient Stated Goal: to quit or cut back on smoking OT Goal Formulation: With patient Time For Goal Achievement: 10/22/22 Potential to Achieve Goals:  Good  OT Frequency:      Co-evaluation              AM-PAC OT "6 Clicks" Daily Activity     Outcome Measure Help from another person eating meals?: None Help from another person taking care of personal grooming?: None Help from another person toileting, which includes using toliet, bedpan, or urinal?: None Help from another person bathing (including washing, rinsing, drying)?: A Little Help from another person to put on and taking off regular upper body clothing?: None Help from another person to put on and taking off regular lower body clothing?: A Little 6 Click Score: 22   End of Session Equipment Utilized During Treatment: Rolling walker (2 wheels)  Activity Tolerance: Patient tolerated treatment well Patient left: in bed;with family/visitor present;with call bell/phone within reach  OT Visit Diagnosis: Muscle weakness (generalized)  (M62.81);Pain Pain - part of body: Hip                Time: 0919-0950 OT Time Calculation (min): 31 min Charges:  OT General Charges $OT Visit: 1 Visit OT Evaluation $OT Eval Low Complexity: 1 Low OT Treatments $Self Care/Home Management : 8-22 mins Josiah Lobo, PhD, MS, OTR/L 10/08/22, 10:14 AM

## 2022-10-08 NOTE — Progress Notes (Signed)
  Transition of Care [facility]) Screening Note   Patient Details  Name: Michelle Walker Date of Birth: [birth year]   Transition of Care [facility]) CM/SW Contact:    Quin Hoop, LCSW Phone Number: 10/08/2022, 9:08 AM    Transition of Care Department [facility]) has reviewed patient and no TOC needs have been identified at this time. We will continue to monitor patient advancement through interdisciplinary progression rounds. If new patient transition needs arise, please place a TOC consult.

## 2022-10-08 NOTE — Evaluation (Signed)
Physical Therapy Evaluation Patient Details Name: Michelle Walker MRN: [id number] DOB: 10-16-1961 Today's Date: 10/08/2022  History of Present Illness  61 y.o. female admitted 10/06/22 with Acute Hypoxic Respiratory Failure in the setting of AECOPD due to Influenza A infection with suspected superimposed CAP, and Pulmonary Hypertension requiring BiPAP.  High risk for intubation. Past medical history significant for breast cancer status post postlumpectomy and radiation therapy (approximately 1 year out from treatment), current smoker, hypertension, hyperlipidemia. 12/7: Respiratory status much improved, weaned off BiPAP to [place].  HS troponin peaked at 507 (determined demand ischemia).   Clinical Impression  Patient reclining in bed upon arrival and agreeable to PT evaluation. Son present throughout session. Patient was alert and oriented x 4 and able to confirm history obtained by OT. She lives with her husband and daughter with her three grandchildren in a single story home with 3-4 steps to enter with bilateral handrails. Prior to hospitalization she was I with all aspects of care and mobility except she had started using a SPC at times due to chronic B hip pain. She endorses 3-4 falls in the last year. She works full time with a cigarette machine. Her family is available to help her 24/7 if needed. Upon PT eval, patient was mod I for bed mobility and transferred and ambulated 80 feet with RW with supervision. She ambulated very slowly and had to stop for a standing break during a coughing fit. She needed cuing for proper AD and body position and hand placement for RW during mobility. Her HR was 111 bpm at rest and climbed to 117 bpm during ambulation. Her SpO2 ranged from 93%-100% during session while on 4L/min O2. Patient has experienced a decline in functional mobility and would benefit from home health PT and a RW upon discharge home. Patient would benefit from skilled physical therapy to address  impairments and functional limitations (see PT Problem List below) to work towards stated goals and return to PLOF or maximal functional independence.      Recommendations for follow up therapy are one component of a multi-disciplinary discharge planning process, led by the attending physician.  Recommendations may be updated based on patient status, additional functional criteria and insurance authorization.  Follow Up Recommendations Home health PT      Assistance Recommended at Discharge Intermittent Supervision/Assistance  Patient can return home with the following  A little help with walking and/or transfers;A little help with bathing/dressing/bathroom;Help with stairs or ramp for entrance;Assist for transportation;Assistance with cooking/housework    Equipment Recommendations Rolling walker (2 wheels);Other (comment) (shower chair for energy conservation while showering)  Recommendations for Other Services  OT consult    Functional Status Assessment Patient has had a recent decline in their functional status and demonstrates the ability to make significant improvements in function in a reasonable and predictable amount of time.     Precautions / Restrictions Precautions Precautions: Fall Precaution Comments: Mod fall risk Restrictions Weight Bearing Restrictions: No      Mobility  Bed Mobility Overal bed mobility: Modified Independent                  Transfers Overall transfer level: Needs assistance Equipment used: Rolling walker (2 wheels) Transfers: Sit to/from Stand Sit to Stand: Supervision           General transfer comment: patient transfered bed <> toilet using RW and with supervision. Required cuing for safe hand placement and forgot to reach back when sitting down on the bed. Reported lightheadness  when going to sit on bed after toileting as well as ambulation.    Ambulation/Gait Ambulation/Gait assistance: Supervision Gait Distance (Feet): 80  Feet Assistive device: Rolling walker (2 wheels) Gait Pattern/deviations: Step-through pattern, Decreased stride length Gait velocity: very slow     General Gait Details: Patient ambulated very slowly for 80 feet with one standing rest during coughing fit. Required supervision with cuing to rest and continue. HR up to 117 bpm and SpO2 down to 93% on 4L/min O2.  Stairs            Wheelchair Mobility    Modified Rankin (Stroke Patients Only)       Balance Overall balance assessment: Needs assistance   Sitting balance-Leahy Scale: Good Sitting balance - Comments: able to don socks independnetly with increased time while sitting at edge of bed.   Standing balance support: During functional activity Standing balance-Leahy Scale: Fair Standing balance comment: Patient reaches for objects for support while standing and walking and is much more steady when using RW.                             Pertinent Vitals/Pain Pain Assessment Pain Assessment: 0-10 Pain Score: 7  Pain Location: bilateral hip pain Pain Intervention(s): Limited activity within patient's tolerance, Monitored during session, Repositioned    Home Living Family/patient expects to be discharged to:: Private residence Living Arrangements: Spouse/significant other;Other relatives (Husband, daughter, and her three grandchildren) Available Help at Discharge: Family;Available 24 hours/day Type of Home: House Home Access: Stairs to enter Entrance Stairs-Rails: Can reach both;Left;Right Entrance Stairs-Number of Steps: 4-5   Home Layout: One level Home Equipment: Cane - single point      Prior Function Prior Level of Function : Independent/Modified Independent             Mobility Comments: Recently started using a SPC, 2/2 hip pain. Reports ~ 3-4 falls in previous 12 months ADLs Comments: Works in physically demanding job (FT). Independent with all aspects of care and mobility except using cane  at times, drives. Daughter who lives w/ pt handles most shopping, cooking, cleaning, w/ pt assisting as needed.     Hand Dominance   Dominant Hand: Right    Extremity/Trunk Assessment   Upper Extremity Assessment Upper Extremity Assessment: Overall WFL for tasks assessed    Lower Extremity Assessment Lower Extremity Assessment: Generalized weakness (increased effort to rise from bed and toilet with B UE assist)    Cervical / Trunk Assessment Cervical / Trunk Assessment: Normal  Communication   Communication: No difficulties  Cognition Arousal/Alertness: Awake/alert Behavior During Therapy: WFL for tasks assessed/performed Overall Cognitive Status: Within Functional Limits for tasks assessed                                          General Comments      Exercises Other Exercises Other Exercises: educated patient regarding role of PT in acute care setting, discharge reccomendations, safe use of RW, safe transfer techniques. Other Exercises: Patient practiced transfers, standing balance, walking navigation, RW use, and seated balance while donning socks, walking to and using the bathroom, and washing hands. She required supervision and cuing for improved safety and technique during these activities.   Assessment/Plan    PT Assessment Patient needs continued PT services  PT Problem List Decreased activity tolerance;Cardiopulmonary status limiting activity;Decreased  mobility;Decreased strength;Decreased balance;Pain       PT Treatment Interventions DME instruction;Functional mobility training;Balance training;Patient/family education;Gait training;Therapeutic activities;Neuromuscular re-education;Stair training;Therapeutic exercise    PT Goals (Current goals can be found in the Care Plan section)  Acute Rehab PT Goals Patient Stated Goal: to get better and go home PT Goal Formulation: With patient Time For Goal Achievement: 10/22/22 Potential to Achieve  Goals: Good    Frequency Min 2X/week     Co-evaluation               AM-PAC PT "6 Clicks" Mobility  Outcome Measure Help needed turning from your back to your side while in a flat bed without using bedrails?: None Help needed moving from lying on your back to sitting on the side of a flat bed without using bedrails?: None Help needed moving to and from a bed to a chair (including a wheelchair)?: A Little Help needed standing up from a chair using your arms (e.g., wheelchair or bedside chair)?: A Little Help needed to walk in hospital room?: A Little Help needed climbing 3-5 steps with a railing? : A Little 6 Click Score: 20    End of Session Equipment Utilized During Treatment: Gait belt;Oxygen Activity Tolerance: Patient limited by fatigue Patient left: in bed;with call bell/phone within reach;with family/visitor present Nurse Communication: Mobility status PT Visit Diagnosis: Unsteadiness on feet (R26.81);Difficulty in walking, not elsewhere classified (R26.2);History of falling (Z91.81)    Time: 1007-1050 PT Time Calculation (min) (ACUTE ONLY): 43 min   Charges:   PT Evaluation $PT Eval Low Complexity: 1 Low PT Treatments $Gait Training: 8-22 mins $Therapeutic Activity: 8-22 mins        Everlean Alstrom. Graylon Good, PT, DPT 10/08/22, 11:14 AM

## 2022-10-09 DIAGNOSIS — J9601 Acute respiratory failure with hypoxia: Secondary | ICD-10-CM | POA: Diagnosis not present

## 2022-10-09 DIAGNOSIS — J111 Influenza due to unidentified influenza virus with other respiratory manifestations: Secondary | ICD-10-CM | POA: Diagnosis not present

## 2022-10-09 DIAGNOSIS — J189 Pneumonia, unspecified organism: Secondary | ICD-10-CM | POA: Diagnosis not present

## 2022-10-09 LAB — GLUCOSE, CAPILLARY
Glucose-Capillary: 110 mg/dL — ABNORMAL HIGH (ref 70–99)
Glucose-Capillary: 163 mg/dL — ABNORMAL HIGH (ref 70–99)
Glucose-Capillary: 181 mg/dL — ABNORMAL HIGH (ref 70–99)
Glucose-Capillary: 144 mg/dL — ABNORMAL HIGH (ref 70–99)

## 2022-10-09 LAB — BASIC METABOLIC PANEL
Anion gap: 7 (ref 5–15)
BUN: 12 mg/dL (ref 6–20)
CO2: 27 mmol/L (ref 22–32)
Calcium: 8.7 mg/dL — ABNORMAL LOW (ref 8.9–10.3)
Chloride: 106 mmol/L (ref 98–111)
Creatinine, Ser: 0.45 mg/dL (ref 0.44–1.00)
GFR, Estimated: >60 mL/min (ref >60)
Glucose, Bld: 118 mg/dL — ABNORMAL HIGH (ref 70–99)
Potassium: 3.3 mmol/L — ABNORMAL LOW (ref 3.5–5.1)
Sodium: 140 mmol/L (ref 135–145)

## 2022-10-09 LAB — CBC
HCT: 38.6 % (ref 36.0–46.0)
Hemoglobin: 12.3 g/dL (ref 12.0–15.0)
MCH: 26.3 pg (ref 26.0–34.0)
MCHC: 31.9 g/dL (ref 30.0–36.0)
MCV: 82.5 fL (ref 80.0–100.0)
Platelets: 199 10*3/uL (ref 150–400)
RBC: 4.68 MIL/uL (ref 3.87–5.11)
RDW: 14 % (ref 11.5–15.5)
WBC: 9.5 10*3/uL (ref 4.0–10.5)
nRBC: 0 % (ref 0.0–0.2)

## 2022-10-09 MED ORDER — NICOTINE 14 MG/24HR TD PT24
14.0000 mg | MEDICATED_PATCH | Freq: Every day | TRANSDERMAL | Status: DC
  Administered 2022-10-09 – 2022-10-10 (×2): 14 mg via TRANSDERMAL
  Filled 2022-10-09 (×2): qty 1

## 2022-10-09 MED ORDER — POTASSIUM CHLORIDE CRYS ER 20 MEQ PO TBCR
40.0000 meq | EXTENDED_RELEASE_TABLET | Freq: Two times a day (BID) | ORAL | Status: AC
  Administered 2022-10-09 (×2): 40 meq via ORAL
  Filled 2022-10-09 (×2): qty 2

## 2022-10-09 NOTE — Progress Notes (Addendum)
PROGRESS NOTE  Michelle Walker    DOB: 03/10/1961, 60 y.o.  MRN:[id number]    Code Status: Full Code   DOA: 10/06/2022   LOS: 3   Brief hospital course  Michelle Walker is a 60 y.o. female with a PMH significant for breast cancer status post postlumpectomy and radiation therapy (approximately 1 year out from treatment), current smoker, hypertension, hyperlipidemia.  They presented from home to the ED on 10/06/2022 with Acute Hypoxic Respiratory Failure in the setting of AECOPD due to Influenza A infection with suspected superimposed CAP, and Pulmonary Hypertension requiring BiPAP.   In the ED, it was found that they had oxygen desaturation and increased WOB requiring bipap. Temperature 98.5 F, pulse 138, respiratory rate 36, blood pressure 143/80, SpO2 75% on room air with EMS  Significant findings included Sodium 132, potassium 3.2, glucose 127, AST 154, BNP 549, high-sensitivity troponin 342, lactic acid 1.8, procalcitonin 0.33, WBC 6.9 VBG: pH 7.35/pCO2 53/pO2 36/bicarb 29.3 Positive for influenza A. Chest X-ray>>IMPRESSION: New right-greater-than-left bibasilar consolidations, concerning for multifocal infection. CT angio chest>>IMPRESSION: Fairly extensive patchy alveolar and interstitial infiltrates are seen in both lungs, more so on the right side suggesting multifocal pneumonia. There is no pleural effusion. There is no evidence of central pulmonary artery embolism. Ectasia of main pulmonary artery suggests pulmonary arterial hypertension. Increased RV LV ratio suggests right heart dysfunction. There is no evidence of thoracic aortic dissection. There are slightly enlarged lymph nodes in mediastinum, possibly suggesting reactive hyperplasia. Left lobe of thyroid is larger than unusual. If clinically warranted, follow-up thyroid sonogram in nonemergent setting may be considered. Small hiatal hernia.  They were initially treated with 2 L normal saline bolus, DuoNebs,  azithromycin, ceftriaxone, heparin drip initiated .   Patient was admitted to [facility] service for further workup and management of Acute hypoxic respiratory failure as outlined in detail below.  12/8- transferred to [facility] service  10/09/22 -Currently on 2Lnc  Assessment & Plan  Principal Problem:   Respiratory failure (HCC) Active Problems:   Flu   Multifocal pneumonia   Tachycardia  Acute Hypoxic Respiratory Failure in the setting of AECOPD due to Influenza A infection with suspected superimposed CAP, & Pulmonary Hypertension CT angio Chest on admission negative for PE, concerning for multifocal PNA, pulmonary arterial HTN, increased RV:LV ratio concerning for right heart dysfunction -Supplemental O2 as needed to maintain O2 sats 88 to 92% -wean to room air as tolerated  - ambulate with pulse ox prior to discharge -Bronchodilators & Pulmicort nebs -IV Steroids -ABX changed to unasyn - PT/OT - supportive/symptomatic care   Sepsis due to Influenza A Infection & suspected superimposed CAP (present on admission, met SIRS Criteria: HR 138, RR 36). Sepsis criteria have resolved. Procalcitonin low and points to likely viral mediator. BxCx NGTD -Monitor fever curve -Follow cultures -Continue Tamiflu and unasyn pending cultures & sensitivities   Elevated Troponin in setting of demand ischemia- flattened and declined. No chest pain. No events on monitor. Echo is unremarkable -Continuous cardiac monitoring -Maintain MAP >65  Tachycardia- improved. Patient is asymptomatic and sounds regular on exam. May be related to SABA use, inflammation - continue telemetry - review med contributions   #Mild Hyponatremia -resolved #Mild Hypokalemia- 2/2 HCTZ use -Monitor I&O's / urinary output -Follow BMP -Ensure adequate renal perfusion -Avoid nephrotoxic agents as able -Replace electrolytes as indicated  Body mass index is 34.52 kg/m.  VTE ppx: SCDs Start: 10/06/22 1415  Diet:     Diet    Diet heart   healthy/carb modified Room service appropriate? Yes; Fluid consistency: Thin   Consultants: CCM  Subjective 10/09/22    Pt reports feeling much better. Denies chest pain, SOB at rest. Ambulating small amount without difficulty.    Objective   Vitals:   10/08/22 2050 10/08/22 2341 10/09/22 0330 10/09/22 0500  BP:  124/76 (!) 136/92   Pulse:  100 96   Resp:  19 19   Temp:  97.9 F (36.6 C) 97.8 F (36.6 C)   TempSrc:      SpO2: 98% 98% 100%   Weight:    97 kg  Height:        Intake/Output Summary (Last 24 hours) at 10/09/2022 0751 Last data filed at 10/08/2022 1227 Gross per 24 hour  Intake 100.4 ml  Output --  Net 100.4 ml    Filed Weights   10/06/22 1251 10/08/22 0555 10/09/22 0500  Weight: 90.7 kg 96.3 kg 97 kg     Physical Exam:  General: awake, alert, NAD HEENT: atraumatic, clear conjunctiva, anicteric sclera, MMM, hearing grossly normal Respiratory: normal respiratory effort. Cardiovascular: quick capillary refill, normal S1/S2, RRR, no JVD, murmurs Nervous: A&O x3. no gross focal neurologic deficits, normal speech Extremities: moves all equally, no edema, normal tone Skin: dry, intact, normal temperature, normal color. No rashes, lesions or ulcers on exposed skin Psychiatry: normal mood, congruent affect  Labs   I have personally reviewed the following labs and imaging studies CBC    Component Value Date/Time   WBC 9.5 10/09/2022 0516   RBC 4.68 10/09/2022 0516   HGB 12.3 10/09/2022 0516   HCT 38.6 10/09/2022 0516   PLT 199 10/09/2022 0516   MCV 82.5 10/09/2022 0516   MCH 26.3 10/09/2022 0516   MCHC 31.9 10/09/2022 0516   RDW 14.0 10/09/2022 0516   LYMPHSABS 0.3 (L) 10/06/2022 1019   MONOABS 0.3 10/06/2022 1019   EOSABS 0.0 10/06/2022 1019   BASOSABS 0.0 10/06/2022 1019      Latest Ref Rng & Units 10/09/2022    5:16 AM 10/08/2022    6:04 AM 10/07/2022    8:59 PM  BMP  Glucose 70 - 99 mg/dL 118  144    BUN 6 - 20 mg/dL 12  10     Creatinine 0.44 - 1.00 mg/dL 0.45  0.52    Sodium 135 - 145 mmol/L 140  138    Potassium 3.5 - 5.1 mmol/L 3.3  3.5  3.2   Chloride 98 - 111 mmol/L 106  107    CO2 22 - 32 mmol/L 27  25    Calcium 8.9 - 10.3 mg/dL 8.7  8.3      ECHOCARDIOGRAM COMPLETE  Result Date: 10/07/2022    ECHOCARDIOGRAM REPORT   Patient Name:   Brentley G Mcgowen Date of Exam: 10/07/2022 Medical Rec #:  [id number]       Height:       66.0 in Accession #:    [id number]      Weight:       200.0 lb Date of Birth:  06/09/1961      BSA:          2.000 m Patient Age:    60 years        BP:           Not listed in chart/Not listed in                                                 chart mmHg Patient Gender: F               HR:           Not listed in chart bpm. Exam Location:  [facility] Procedure: Cardiac Doppler and Color Doppler Indications:     Elevated Troponin  History:         Patient has prior history of Echocardiogram examinations, most                  recent 11/10/2020. Risk Factors:Hypertension. Breast cancer.  Sonographer:     Sherrie Sport Referring Phys:  [id number] Bradly Bienenstock Diagnosing Phys: Serafina Royals MD  Sonographer Comments: Suboptimal apical window. Image acquisition challenging due to mastectomy. IMPRESSIONS  1. Left ventricular ejection fraction, by estimation, is 60 to 65%. The left ventricle has normal function. The left ventricle has no regional wall motion abnormalities. Left ventricular diastolic parameters were normal.  2. Right ventricular systolic function is normal. The right ventricular size is normal.  3. The mitral valve is normal in structure. Trivial mitral valve regurgitation.  4. The aortic valve is normal in structure. Aortic valve regurgitation is trivial. FINDINGS  Left Ventricle: Left ventricular ejection fraction, by estimation, is 60 to 65%. The left ventricle has normal function. The left ventricle has no regional wall motion abnormalities. The left ventricular internal cavity size was normal in size.  There is  no left ventricular hypertrophy. Left ventricular diastolic parameters were normal. Right Ventricle: The right ventricular size is normal. No increase in right ventricular wall thickness. Right ventricular systolic function is normal. Left Atrium: Left atrial size was normal in size. Right Atrium: Right atrial size was normal in size. Pericardium: There is no evidence of pericardial effusion. Mitral Valve: The mitral valve is normal in structure. Trivial mitral valve regurgitation. Tricuspid Valve: The tricuspid valve is normal in structure. Tricuspid valve regurgitation is trivial. Aortic Valve: The aortic valve is normal in structure. Aortic valve regurgitation is trivial. Aortic valve mean gradient measures 2.0 mmHg. Aortic valve peak gradient measures 4.1 mmHg. Aortic valve area, by VTI measures 4.39 cm. Pulmonic Valve: The pulmonic valve was normal in structure. Pulmonic valve regurgitation is trivial. Aorta: The aortic root and ascending aorta are structurally normal, with no evidence of dilitation. IAS/Shunts: No atrial level shunt detected by color flow Doppler.  LEFT VENTRICLE PLAX 2D LVIDd:         3.40 cm   Diastology LVIDs:         2.50 cm   LV e' medial:    6.31 cm/s LV PW:         1.10 cm   LV E/e' medial:  9.8 LV IVS:        1.10 cm   LV e' lateral:   10.30 cm/s LVOT diam:     2.20 cm   LV E/e' lateral: 6.0 LV SV:         63 LV SV Index:   31 LVOT Area:     3.80 cm  RIGHT VENTRICLE RV Basal diam:  3.40 cm RV Mid diam:    2.50 cm RV S prime:     11.50 cm/s TAPSE (M-mode): 1.9 cm LEFT ATRIUM           Index        RIGHT ATRIUM           Index LA diam:      4.10 cm 2.05 cm/m   RA Area:  15.60 cm LA Vol (A2C): 51.6 ml 25.80 ml/m  RA Volume:   41.00 ml  20.50 ml/m LA Vol (A4C): 18.7 ml 9.35 ml/m  AORTIC VALVE AV Area (Vmax):    3.55 cm AV Area (Vmean):   3.51 cm AV Area (VTI):     4.39 cm AV Vmax:           100.95 cm/s AV Vmean:          66.200 cm/s AV VTI:            0.143 m AV Peak  Grad:      4.1 mmHg AV Mean Grad:      2.0 mmHg LVOT Vmax:         94.20 cm/s LVOT Vmean:        61.200 cm/s LVOT VTI:          0.165 m LVOT/AV VTI ratio: 1.15  AORTA Ao Root diam: 3.33 cm MITRAL VALVE                TRICUSPID VALVE MV Area (PHT): 8.62 cm     TR Peak grad:   19.5 mmHg MV Decel Time: 88 msec      TR Vmax:        221.00 cm/s MV E velocity: 61.80 cm/s MV A velocity: 123.00 cm/s  SHUNTS MV E/A ratio:  0.50         Systemic VTI:  0.16 m                             Systemic Diam: 2.20 cm Bruce Kowalski MD Electronically signed by Bruce Kowalski MD Signature Date/Time: 10/07/2022/1:22:19 PM    Final     Disposition Plan & Communication  Patient status: Inpatient  Admitted From: Home Planned disposition location: Home Anticipated discharge date: 12/10 pending wean to room air  Family Communication: none   Author: Chelsey L Anderson, DO [facility] 10/09/2022, 7:51 AM   Available by Epic secure chat 7AM-7PM. If 7PM-7AM, please contact night-coverage.  [facility] contact information found on [email or web address].  

## 2022-10-10 DIAGNOSIS — J189 Pneumonia, unspecified organism: Secondary | ICD-10-CM | POA: Diagnosis not present

## 2022-10-10 DIAGNOSIS — J111 Influenza due to unidentified influenza virus with other respiratory manifestations: Secondary | ICD-10-CM | POA: Diagnosis not present

## 2022-10-10 DIAGNOSIS — E876 Hypokalemia: Secondary | ICD-10-CM

## 2022-10-10 DIAGNOSIS — J9601 Acute respiratory failure with hypoxia: Secondary | ICD-10-CM | POA: Diagnosis not present

## 2022-10-10 DIAGNOSIS — I10 Essential (primary) hypertension: Secondary | ICD-10-CM | POA: Diagnosis not present

## 2022-10-10 HISTORY — DX: Hypokalemia: E87.6

## 2022-10-10 LAB — BASIC METABOLIC PANEL
Anion gap: 6 (ref 5–15)
BUN: 13 mg/dL (ref 6–20)
CO2: 26 mmol/L (ref 22–32)
Calcium: 8.7 mg/dL — ABNORMAL LOW (ref 8.9–10.3)
Chloride: 105 mmol/L (ref 98–111)
Creatinine, Ser: 0.44 mg/dL (ref 0.44–1.00)
GFR, Estimated: >60 mL/min (ref >60)
Glucose, Bld: 90 mg/dL (ref 70–99)
Potassium: 3.4 mmol/L — ABNORMAL LOW (ref 3.5–5.1)
Sodium: 137 mmol/L (ref 135–145)

## 2022-10-10 LAB — GLUCOSE, CAPILLARY
Glucose-Capillary: 95 mg/dL (ref 70–99)
Glucose-Capillary: 151 mg/dL — ABNORMAL HIGH (ref 70–99)

## 2022-10-10 MED ORDER — POTASSIUM CHLORIDE CRYS ER 20 MEQ PO TBCR: 40.0000 meq | EXTENDED_RELEASE_TABLET | Freq: Every day | ORAL | 2 refills | Status: DC

## 2022-10-10 MED ORDER — POTASSIUM CHLORIDE CRYS ER 20 MEQ PO TBCR
40.0000 meq | EXTENDED_RELEASE_TABLET | Freq: Two times a day (BID) | ORAL | Status: DC
  Administered 2022-10-10: 40 meq via ORAL
  Filled 2022-10-10: qty 2

## 2022-10-10 MED ORDER — NICOTINE 14 MG/24HR TD PT24: 14.0000 mg | MEDICATED_PATCH | Freq: Every day | TRANSDERMAL | 0 refills | Status: AC

## 2022-10-10 MED ORDER — ALBUTEROL SULFATE HFA 108 (90 BASE) MCG/ACT IN AERS: 2.0000 | INHALATION_SPRAY | Freq: Four times a day (QID) | RESPIRATORY_TRACT | 2 refills | Status: AC | PRN

## 2022-10-10 MED ORDER — PANTOPRAZOLE SODIUM 40 MG PO TBEC: 40.0000 mg | DELAYED_RELEASE_TABLET | Freq: Every day | ORAL | Status: DC

## 2022-10-10 MED ORDER — AMOXICILLIN-POT CLAVULANATE 500-125 MG PO TABS: 1.0000 | ORAL_TABLET | Freq: Three times a day (TID) | ORAL | 0 refills | Status: AC

## 2022-10-10 NOTE — Progress Notes (Signed)
SATURATION QUALIFICATIONS: (This note is used to comply with regulatory documentation for home oxygen)  Patient Saturations on Room Air at Rest = 99%  Patient Saturations on Room Air while Ambulating = 90%  Patient Saturations on 1 Liters of oxygen while Ambulating = 98%  Please briefly explain why patient needs home oxygen:

## 2022-10-10 NOTE — Progress Notes (Incomplete)
PROGRESS NOTE  Michelle Walker    DOB: 14-Jun-1961, 61 y.o.  BJS:[id number]    Code Status: Full Code   DOA: 10/06/2022   LOS: 4   Brief hospital course  Michelle Walker is a 61 y.o. female with a PMH significant for breast cancer status post postlumpectomy and radiation therapy (approximately 1 year out from treatment), current smoker, hypertension, hyperlipidemia.  They presented from home to the ED on 10/06/2022 with Acute Hypoxic Respiratory Failure in the setting of AECOPD due to Influenza A infection with suspected superimposed CAP, and Pulmonary Hypertension requiring BiPAP.   In the ED, it was found that they had oxygen desaturation and increased WOB requiring bipap. Temperature 98.5 F, pulse 138, respiratory rate 36, blood pressure 143/80, SpO2 75% on room air with EMS  Significant findings included Sodium 132, potassium 3.2, glucose 127, AST 154, BNP 549, high-sensitivity troponin 342, lactic acid 1.8, procalcitonin 0.33, WBC 6.9 VBG: pH 7.35/pCO2 53/pO2 36/bicarb 29.3 Positive for influenza A. Chest X-ray>>IMPRESSION: New right-greater-than-left bibasilar consolidations, concerning for multifocal infection. CT angio chest>>IMPRESSION: Fairly extensive patchy alveolar and interstitial infiltrates are seen in both lungs, more so on the right side suggesting multifocal pneumonia. There is no pleural effusion. There is no evidence of central pulmonary artery embolism. Ectasia of main pulmonary artery suggests pulmonary arterial hypertension. Increased RV LV ratio suggests right heart dysfunction. There is no evidence of thoracic aortic dissection. There are slightly enlarged lymph nodes in mediastinum, possibly suggesting reactive hyperplasia. Left lobe of thyroid is larger than unusual. If clinically warranted, follow-up thyroid sonogram in nonemergent setting may be considered. Small hiatal hernia.  They were initially treated with 2 L normal saline bolus, DuoNebs,  azithromycin, ceftriaxone, heparin drip initiated .   Patient was admitted to [facility] service for further workup and management of Acute hypoxic respiratory failure as outlined in detail below.  12/8- transferred to [facility] service  10/10/22 -Currently on 2Lnc  Assessment & Plan  Principal Problem:   Respiratory failure ([place]) Active Problems:   Flu   Multifocal pneumonia   Tachycardia  Acute Hypoxic Respiratory Failure in the setting of AECOPD due to Influenza A infection with suspected superimposed CAP, & Pulmonary Hypertension CT angio Chest on admission negative for PE, concerning for multifocal PNA, pulmonary arterial HTN, increased RV:LV ratio concerning for right heart dysfunction -Supplemental O2 as needed to maintain O2 sats 88 to 92% -wean to room air as tolerated  - ambulate with pulse ox prior to discharge -Bronchodilators & Pulmicort nebs -IV Steroids -ABX changed to unasyn - PT/OT - supportive/symptomatic care   Sepsis due to Influenza A Infection & suspected superimposed CAP (present on admission, met SIRS Criteria: HR 138, RR 36). Sepsis criteria have resolved. Procalcitonin low and points to likely viral mediator. BxCx NGTD -Monitor fever curve -Follow cultures -Continue Tamiflu and unasyn pending cultures & sensitivities   Elevated Troponin in setting of demand ischemia- flattened and declined. No chest pain. No events on monitor. Echo is unremarkable -Continuous cardiac monitoring -Maintain MAP >65  Tachycardia- improved. Patient is asymptomatic and sounds regular on exam. May be related to SABA use, inflammation - continue telemetry - review med contributions   #Mild Hyponatremia -resolved #Mild Hypokalemia- 2/2 HCTZ use -Monitor I&O's / urinary output -Follow BMP -Ensure adequate renal perfusion -Avoid nephrotoxic agents as able -Replace electrolytes as indicated  Body mass index is 35.12 kg/m.  VTE ppx: SCDs Start: 10/06/22 1415  Diet:     Diet    Diet heart  healthy/carb modified Room service appropriate? Yes; Fluid consistency: Thin   Consultants: CCM  Subjective 10/10/22    Pt reports feeling much better. Denies chest pain, SOB at rest. Ambulating small amount without difficulty.    Objective   Vitals:   10/09/22 2037 10/10/22 0132 10/10/22 0500 10/10/22 0527  BP:  (!) 145/99  130/89  Pulse:  96  90  Resp:  18  19  Temp:  98.3 F (36.8 C)  98 F (36.7 C)  TempSrc:  Oral  Oral  SpO2: 97% 99%  100%  Weight:   98.7 kg   Height:        Intake/Output Summary (Last 24 hours) at 10/10/2022 0741 Last data filed at 10/09/2022 2246 Gross per 24 hour  Intake 910 ml  Output --  Net 910 ml    Filed Weights   10/08/22 0555 10/09/22 0500 10/10/22 0500  Weight: 96.3 kg 97 kg 98.7 kg     Physical Exam:  General: awake, alert, NAD HEENT: atraumatic, clear conjunctiva, anicteric sclera, MMM, hearing grossly normal Respiratory: normal respiratory effort. Cardiovascular: quick capillary refill, normal S1/S2, RRR, no JVD, murmurs Nervous: A&O x3. no gross focal neurologic deficits, normal speech Extremities: moves all equally, no edema, normal tone Skin: dry, intact, normal temperature, normal color. No rashes, lesions or ulcers on exposed skin Psychiatry: normal mood, congruent affect  Labs   I have personally reviewed the following labs and imaging studies CBC    Component Value Date/Time   WBC 9.5 10/09/2022 0516   RBC 4.68 10/09/2022 0516   HGB 12.3 10/09/2022 0516   HCT 38.6 10/09/2022 0516   PLT 199 10/09/2022 0516   MCV 82.5 10/09/2022 0516   MCH 26.3 10/09/2022 0516   MCHC 31.9 10/09/2022 0516   RDW 14.0 10/09/2022 0516   LYMPHSABS 0.3 (L) 10/06/2022 1019   MONOABS 0.3 10/06/2022 1019   EOSABS 0.0 10/06/2022 1019   BASOSABS 0.0 10/06/2022 1019      Latest Ref Rng & Units 10/10/2022    6:47 AM 10/09/2022    5:16 AM 10/08/2022    6:04 AM  BMP  Glucose 70 - 99 mg/dL 90  118  144   BUN 6 - 20 mg/dL _0 Creatinine 0.44 - 1.00 mg/dL 0.44  0.45  0.52   Sodium 135 - 145 mmol/L 137  140  138   Potassium 3.5 - 5.1 mmol/L 3.4  3.3  3.5   Chloride 98 - 111 mmol/L 105  106  107   CO2 22 - 32 mmol/L _1 Calcium 8.9 - 10.3 mg/dL 8.7  8.7  8.3     No results found.  Disposition Plan & Communication  Patient status: Inpatient  Admitted From: Home Planned disposition location: Home Anticipated discharge date: 12/10 pending wean to room air  Family Communication: none   Author: Richarda Osmond, DO [facility] 10/10/2022, 7:41 AM   Available by Epic secure chat 7AM-7PM. If 7PM-7AM, please contact night-coverage.  [facility] contact information found on [email or web address].

## 2022-10-10 NOTE — Progress Notes (Signed)
PHARMACIST - PHYSICIAN COMMUNICATION  CONCERNING: IV to Oral Route Change Policy  RECOMMENDATION: This patient is receiving pantoprazole by the intravenous route.  Based on criteria approved by the Pharmacy and Therapeutics Committee, the intravenous medication(s) is/are being converted to the equivalent oral dose form(s).  DESCRIPTION: These criteria include: The patient is eating (either orally or via tube) and/or has been taking other orally administered medications for a least 24 hours The patient has no evidence of active gastrointestinal bleeding or impaired GI absorption (gastrectomy, short bowel, patient on TNA or NPO).  If you have questions about this conversion, please contact the [place], [facility] 10/10/2022 12:11 PM

## 2022-10-10 NOTE — Discharge Summary (Signed)
Physician Discharge Summary  Patient: Michelle Walker HER:[id number] DOB: [birth year]   Code Status: Full Code Admit date: 10/06/2022 Discharge date: 10/10/2022 Disposition: Home, [place] services:[id number]} PCP: Casilda Carls, MD  Recommendations for Outpatient Follow-up:  Follow up with PCP within 1-2 weeks Regarding general hospital follow up and preventative care Recommend    Discharge Diagnoses:  Principal Problem:   Respiratory failure ([place]) Active Problems:   Flu   Multifocal pneumonia   Tachycardia   Hypokalemia  Brief Hospital Course Summary: Pt ***  Discharge Condition: {DISCHARGE CONDITION:19696}, improved Recommended discharge diet: {Discharge EHUD:[id number]}  Consultations: ***  Procedures/Studies: ***   Allergies as of 10/10/2022       Reactions   Triamcinolone Other (See Comments)   Hypopigmentation s/p steroid injection   Tape Rash   SURGICAL TAPE AFTER BREAST BIOPSY        Medication List     STOP taking these medications    DULoxetine 60 MG capsule Commonly known as: CYMBALTA       TAKE these medications    albuterol 108 (90 Base) MCG/ACT inhaler Commonly known as: VENTOLIN HFA Inhale 2 puffs into the lungs every 6 (six) hours as needed for wheezing or shortness of breath.   allopurinol 100 MG tablet Commonly known as: ZYLOPRIM Take 100 mg by mouth at bedtime.   amLODipine 10 MG tablet Commonly known as: NORVASC Take 20 mg by mouth at bedtime.   amoxicillin-clavulanate 500-125 MG tablet Commonly known as: Augmentin Take 1 tablet by mouth 3 (three) times daily for 2 days.   atorvastatin 10 MG tablet Commonly known as: LIPITOR Take 10 mg by mouth daily.   CALCIUM 600+D3 PO Take 1 tablet by mouth at bedtime.   hydrochlorothiazide 25 MG tablet Commonly known as: HYDRODIURIL Take by mouth.   letrozole 2.5 MG tablet Commonly known as: [place] 1 TABLET(2.5 MG) BY MOUTH DAILY. START AFTER RADIATION IS OVER   losartan  100 MG tablet Commonly known as: COZAAR Take 1 tablet by mouth daily.   metoprolol succinate 25 MG 24 hr tablet Commonly known as: TOPROL-XL Take 50 mg by mouth at bedtime.   nicotine 14 mg/24hr patch Commonly known as: NICODERM CQ - dosed in mg/24 hours Place 1 patch (14 mg total) onto the skin daily. Start taking on: October 11, 2022   potassium chloride SA 20 MEQ tablet Commonly known as: KLOR-CON M Take 2 tablets (40 mEq total) by mouth daily. What changed: See the new instructions.   pregabalin 75 MG capsule Commonly known as: Lyrica Take 2 capsules (150 mg total) by mouth 2 (two) times daily.         Subjective   Pt reports ***  All questions and concerns were addressed at time of discharge.  Objective  Blood pressure 130/89, pulse 90, temperature 98 F (36.7 C), temperature source Oral, resp. rate 19, height '5\' 6"'$  (1.676 m), weight 98.7 kg, last menstrual period 01/05/2017, SpO2 100 %.   General: Pt is alert, awake, not in acute distress Cardiovascular: RRR, S1/S2 +, no rubs, no gallops Respiratory: CTA bilaterally, no wheezing, no rhonchi Abdominal: Soft, NT, ND, bowel sounds + Extremities: no edema, no cyanosis  The results of significant diagnostics from this hospitalization (including imaging, microbiology, ancillary and laboratory) are listed below for reference.   Imaging studies: ECHOCARDIOGRAM COMPLETE  Result Date: 10/07/2022    ECHOCARDIOGRAM REPORT   Patient Name:   Michelle Walker Date of Exam: 10/07/2022 Medical Rec #:  [id number]  Height:       66.0 in Accession #:    [id number]      Weight:       200.0 lb Date of Birth:  15-Jan-1961      BSA:          2.000 m Patient Age:    42 years        BP:           Not listed in chart/Not listed in                                               chart mmHg Patient Gender: F               HR:           Not listed in chart bpm. Exam Location:  [facility] Procedure: Cardiac Doppler and Color Doppler Indications:      Elevated Troponin  History:         Patient has prior history of Echocardiogram examinations, most                  recent 11/10/2020. Risk Factors:Hypertension. Breast cancer.  Sonographer:     Sherrie Sport Referring Phys:  [id number] Bradly Bienenstock Diagnosing Phys: Serafina Royals MD  Sonographer Comments: Suboptimal apical window. Image acquisition challenging due to mastectomy. IMPRESSIONS  1. Left ventricular ejection fraction, by estimation, is 60 to 65%. The left ventricle has normal function. The left ventricle has no regional wall motion abnormalities. Left ventricular diastolic parameters were normal.  2. Right ventricular systolic function is normal. The right ventricular size is normal.  3. The mitral valve is normal in structure. Trivial mitral valve regurgitation.  4. The aortic valve is normal in structure. Aortic valve regurgitation is trivial. FINDINGS  Left Ventricle: Left ventricular ejection fraction, by estimation, is 60 to 65%. The left ventricle has normal function. The left ventricle has no regional wall motion abnormalities. The left ventricular internal cavity size was normal in size. There is  no left ventricular hypertrophy. Left ventricular diastolic parameters were normal. Right Ventricle: The right ventricular size is normal. No increase in right ventricular wall thickness. Right ventricular systolic function is normal. Left Atrium: Left atrial size was normal in size. Right Atrium: Right atrial size was normal in size. Pericardium: There is no evidence of pericardial effusion. Mitral Valve: The mitral valve is normal in structure. Trivial mitral valve regurgitation. Tricuspid Valve: The tricuspid valve is normal in structure. Tricuspid valve regurgitation is trivial. Aortic Valve: The aortic valve is normal in structure. Aortic valve regurgitation is trivial. Aortic valve mean gradient measures 2.0 mmHg. Aortic valve peak gradient measures 4.1 mmHg. Aortic valve area, by VTI measures 4.39  cm. Pulmonic Valve: The pulmonic valve was normal in structure. Pulmonic valve regurgitation is trivial. Aorta: The aortic root and ascending aorta are structurally normal, with no evidence of dilitation. IAS/Shunts: No atrial level shunt detected by color flow Doppler.  LEFT VENTRICLE PLAX 2D LVIDd:         3.40 cm   Diastology LVIDs:         2.50 cm   LV e' medial:    6.31 cm/s LV PW:         1.10 cm   LV E/e' medial:  9.8 LV IVS:        1.10  cm   LV e' lateral:   10.30 cm/s LVOT diam:     2.20 cm   LV E/e' lateral: 6.0 LV SV:         63 LV SV Index:   31 LVOT Area:     3.80 cm  RIGHT VENTRICLE RV Basal diam:  3.40 cm RV Mid diam:    2.50 cm RV S prime:     11.50 cm/s TAPSE (M-mode): 1.9 cm LEFT ATRIUM           Index        RIGHT ATRIUM           Index LA diam:      4.10 cm 2.05 cm/m   RA Area:     15.60 cm LA Vol (A2C): 51.6 ml 25.80 ml/m  RA Volume:   41.00 ml  20.50 ml/m LA Vol (A4C): 18.7 ml 9.35 ml/m  AORTIC VALVE AV Area (Vmax):    3.55 cm AV Area (Vmean):   3.51 cm AV Area (VTI):     4.39 cm AV Vmax:           100.95 cm/s AV Vmean:          66.200 cm/s AV VTI:            0.143 m AV Peak Grad:      4.1 mmHg AV Mean Grad:      2.0 mmHg LVOT Vmax:         94.20 cm/s LVOT Vmean:        61.200 cm/s LVOT VTI:          0.165 m LVOT/AV VTI ratio: 1.15  AORTA Ao Root diam: 3.33 cm MITRAL VALVE                TRICUSPID VALVE MV Area (PHT): 8.62 cm     TR Peak grad:   19.5 mmHg MV Decel Time: 88 msec      TR Vmax:        221.00 cm/s MV E velocity: 61.80 cm/s MV A velocity: 123.00 cm/s  SHUNTS MV E/A ratio:  0.50         Systemic VTI:  0.16 m                             Systemic Diam: 2.20 cm Serafina Royals MD Electronically signed by Serafina Royals MD Signature Date/Time: 10/07/2022/1:22:19 PM    Final    CT Angio Chest PE W/Cm &/Or Wo Cm  Result Date: 10/06/2022 CLINICAL DATA:  High clinical suspicion for pulmonary embolism, respiratory distress EXAM: CT ANGIOGRAPHY CHEST WITH CONTRAST TECHNIQUE:  Multidetector CT imaging of the chest was performed using the standard protocol during bolus administration of intravenous contrast. Multiplanar CT image reconstructions and MIPs were obtained to evaluate the vascular anatomy. RADIATION DOSE REDUCTION: This exam was performed according to the departmental dose-optimization program which includes automated exposure control, adjustment of the mA and/or kV according to patient size and/or use of iterative reconstruction technique. CONTRAST:  19m OMNIPAQUE IOHEXOL 350 MG/ML SOLN COMPARISON:  CT done on 10/28/2020, chest radiograph done earlier today FINDINGS: Cardiovascular: There is homogeneous enhancement in thoracic aorta. There are no intraluminal filling defects in central pulmonary artery branches. Evaluation of small peripheral branches is limited by infiltrates. There is ectasia of main pulmonary artery measuring 3.6 cm suggesting pulmonary arterial hypertension. Heart is enlarged in size. RV LV ratio is 1.3. Mediastinum/Nodes:  There are slightly enlarged lymph nodes in mediastinum measuring up to 1.3 cm in short axis. Mediastinal lymph nodes appear slightly more prominent in size. Left lobe of thyroid is larger than right with its inferior margin extending behind the left sternoclavicular joint. Beam hardening artifacts caused by dense contrast limits evaluation of thyroid. Lungs/Pleura: There are new patchy infiltrates in both mid and both lower lung fields, more so in right lower lung field suggesting multifocal pneumonia. There is no pleural effusion or pneumothorax. Upper Abdomen: Small hiatal hernia is seen. Musculoskeletal: No acute findings are seen. Review of the MIP images confirms the above findings. IMPRESSION: Fairly extensive patchy alveolar and interstitial infiltrates are seen in both lungs, more so on the right side suggesting multifocal pneumonia. There is no pleural effusion. There is no evidence of central pulmonary artery embolism. Ectasia  of main pulmonary artery suggests pulmonary arterial hypertension. Increased RV LV ratio suggests right heart dysfunction. There is no evidence of thoracic aortic dissection. There are slightly enlarged lymph nodes in mediastinum, possibly suggesting reactive hyperplasia. Left lobe of thyroid is larger than unusual. If clinically warranted, follow-up thyroid sonogram in nonemergent setting may be considered. Small hiatal hernia. Electronically Signed   By: Elmer Picker M.D.   On: 10/06/2022 13:13   DG Chest Port 1 View  Result Date: 10/06/2022 CLINICAL DATA:  Dyspnea and cough EXAM: PORTABLE CHEST 1 VIEW COMPARISON:  Chest x-ray dated December 01, 2020 FINDINGS: Cardiac and mediastinal contours are unchanged. New right-greater-than-left bibasilar consolidations. Possible associated small right pleural effusion. No evidence of pneumothorax. IMPRESSION: New right-greater-than-left bibasilar consolidations, concerning for multifocal infection. Recommend follow-up PA and lateral chest x-ray in 6-8 weeks to ensure resolution. Electronically Signed   By: Yetta Glassman M.D.   On: 10/06/2022 10:47    Labs: Basic Metabolic Panel: Recent Labs  Lab 10/06/22 1019 10/06/22 1526 10/07/22 0428 10/07/22 2059 10/08/22 0604 10/09/22 0516 10/10/22 0647  NA 132*  --  138  --  138 140 137  K 3.2*  --  3.1* 3.2* 3.5 3.3* 3.4*  CL 97*  --  106  --  107 106 105  CO2 26  --  24  --  '25 27 26  '$ GLUCOSE 127*  --  159*  --  144* 118* 90  BUN 8  --  9  --  '10 12 13  '$ CREATININE 0.66 0.48 0.45  --  0.52 0.45 0.44  CALCIUM 9.0  --  8.3*  --  8.3* 8.7* 8.7*  MG 2.0  --  2.3  --  2.7*  --   --   PHOS  --   --  1.7* 1.6* 3.3  --   --    CBC: Recent Labs  Lab 10/06/22 1019 10/06/22 1526 10/07/22 0428 10/08/22 0604 10/09/22 0516  WBC 6.9 5.9 6.3 11.8* 9.5  NEUTROABS 6.3  --   --   --   --   HGB 12.6 12.1 11.8* 11.7* 12.3  HCT 40.1 38.3 37.4 36.8 38.6  MCV 84.1 84.5 83.7 83.4 82.5  PLT 173 153 151 202 199    Microbiology: ***  Time coordinating discharge: Over 30 minutes  Richarda Osmond, MD  [facility] 10/10/2022, 12:38 PM

## 2022-10-10 NOTE — TOC Transition Note (Signed)
Transition of Care [facility]) - CM/SW Discharge Note   Patient Details  Name: Michelle Walker MRN: [id number] Date of Birth: Apr 10, 1961  [facility]) CM/SW Contact:  Harriet Masson, RN Phone Number:[phone] 10/10/2022, 1:10 PM   Clinical Narrative:    Spoke with pt concerning HHPT. Pt declined and requested only the DME for RW and possible shower chair. Pt receptive to [employer] for DME. Spoke with [facility] concerning this request and pt requesting the DME be delivered to the home verified the address on file. Pt has a good support system, transportation, able to afford all medications with no other needs presented at this time.           Patient Goals and CMS Choice        Discharge Placement                       Discharge Plan and Services                                     Social Determinants of Health (SDOH) Interventions     Readmission Risk Interventions     No data to display

## 2022-10-10 NOTE — Discharge Instructions (Addendum)
Please follow up with your primary care doctor within a week to monitor your breathing and electrolyte levels. You can discuss with them your return to work.  Continue your antibiotic for 2 more days.  I have doubled your home potassium dose every day because you were having slightly low levels throughout your stay.  Congratulations on stopping smoking. I have prescribed nicotine patches to your pharmacy

## 2022-10-10 NOTE — Progress Notes (Signed)
Nsg Discharge Note  Admit Date:  10/06/2022 Discharge date: 10/10/2022   ANEYAH LORTZ to be D/C'd Home per MD order.  AVS completed. Patient/caregiver able to verbalize understanding.  Discharge Medication: Allergies as of 10/10/2022       Reactions   Triamcinolone Other (See Comments)   Hypopigmentation s/p steroid injection   Tape Rash   SURGICAL TAPE AFTER BREAST BIOPSY        Medication List     STOP taking these medications    DULoxetine 60 MG capsule Commonly known as: CYMBALTA       TAKE these medications    albuterol 108 (90 Base) MCG/ACT inhaler Commonly known as: VENTOLIN HFA Inhale 2 puffs into the lungs every 6 (six) hours as needed for wheezing or shortness of breath.   allopurinol 100 MG tablet Commonly known as: ZYLOPRIM Take 100 mg by mouth at bedtime.   amLODipine 10 MG tablet Commonly known as: NORVASC Take 20 mg by mouth at bedtime.   amoxicillin-clavulanate 500-125 MG tablet Commonly known as: Augmentin Take 1 tablet by mouth 3 (three) times daily for 2 days.   atorvastatin 10 MG tablet Commonly known as: LIPITOR Take 10 mg by mouth daily.   CALCIUM 600+D3 PO Take 1 tablet by mouth at bedtime.   hydrochlorothiazide 25 MG tablet Commonly known as: HYDRODIURIL Take by mouth.   letrozole 2.5 MG tablet Commonly known as: [place] 1 TABLET(2.5 MG) BY MOUTH DAILY. START AFTER RADIATION IS OVER   losartan 100 MG tablet Commonly known as: COZAAR Take 1 tablet by mouth daily.   metoprolol succinate 25 MG 24 hr tablet Commonly known as: TOPROL-XL Take 50 mg by mouth at bedtime.   nicotine 14 mg/24hr patch Commonly known as: NICODERM CQ - dosed in mg/24 hours Place 1 patch (14 mg total) onto the skin daily. Start taking on: October 11, 2022   potassium chloride SA 20 MEQ tablet Commonly known as: KLOR-CON M Take 2 tablets (40 mEq total) by mouth daily. What changed: See the new instructions.   pregabalin 75 MG  capsule Commonly known as: Lyrica Take 2 capsules (150 mg total) by mouth 2 (two) times daily.               Durable Medical Equipment  (From admission, onward)           Start     Ordered   10/10/22 1318  For home use only DME 4 wheeled rolling walker with seat  Once       Question:  Patient needs a walker to treat with the following condition  Answer:  Dyspnea   10/10/22 1318   10/10/22 1318  For home use only DME Shower stool  Once        10/10/22 1318            Discharge Assessment: Vitals:   10/10/22 1019 10/10/22 1210  BP:  (!) 138/92  Pulse:  96  Resp:  19  Temp:  97.8 F (36.6 C)  SpO2: 95% 100%   Skin clean, dry and intact without evidence of skin break down, no evidence of skin tears noted. IV catheter discontinued intact. Site without signs and symptoms of complications - no redness or edema noted at insertion site, patient denies c/o pain - only slight tenderness at site.  Dressing with slight pressure applied.  D/c Instructions-Education: Discharge instructions given to patient/family with verbalized understanding. D/c education completed with patient/family including follow up instructions, medication list,  d/c activities limitations if indicated, with other d/c instructions as indicated by MD - patient able to verbalize understanding, all questions fully answered. Patient instructed to return to ED, call 911, or call MD for any changes in condition.  Patient escorted via [place], and D/C home via private auto.  Atilano Ina, RN 10/10/2022 1:39 PM

## 2022-10-11 LAB — CULTURE, BLOOD (ROUTINE X 2)
Culture: NO GROWTH
Culture: NO GROWTH
Special Requests: ADEQUATE
Special Requests: ADEQUATE

## 2022-10-21 ENCOUNTER — Encounter: Payer: Self-pay | Admitting: Internal Medicine

## 2022-10-22 ENCOUNTER — Other Ambulatory Visit: Payer: Self-pay | Admitting: Oncology

## 2022-10-28 ENCOUNTER — Ambulatory Visit
Admission: RE | Admit: 2022-10-28 | Discharge: 2022-10-28 | Disposition: A | Discharge status: Home / Self Care | Payer: 59 | Source: Ambulatory Visit | Attending: Oncology | Admitting: Oncology

## 2022-10-28 DIAGNOSIS — Z1231 Encounter for screening mammogram for malignant neoplasm of breast: Secondary | ICD-10-CM | POA: Diagnosis present

## 2022-10-28 DIAGNOSIS — Z853 Personal history of malignant neoplasm of breast: Secondary | ICD-10-CM | POA: Insufficient documentation

## 2022-10-28 DIAGNOSIS — Z9012 Acquired absence of left breast and nipple: Secondary | ICD-10-CM | POA: Diagnosis not present

## 2022-10-28 DIAGNOSIS — Z08 Encounter for follow-up examination after completed treatment for malignant neoplasm: Secondary | ICD-10-CM

## 2022-11-02 ENCOUNTER — Ambulatory Visit
Admission: RE | Admit: 2022-11-02 | Discharge: 2022-11-02 | Disposition: A | Discharge status: Home / Self Care | Payer: 59 | Source: Ambulatory Visit | Attending: Internal Medicine | Admitting: Internal Medicine

## 2022-11-02 ENCOUNTER — Ambulatory Visit
Admission: RE | Admit: 2022-11-02 | Discharge: 2022-11-02 | Disposition: A | Discharge status: Home / Self Care | Payer: 59 | Attending: Internal Medicine | Admitting: Internal Medicine

## 2022-11-02 ENCOUNTER — Other Ambulatory Visit: Payer: Self-pay | Admitting: Internal Medicine

## 2022-11-02 DIAGNOSIS — J189 Pneumonia, unspecified organism: Secondary | ICD-10-CM | POA: Diagnosis present

## 2022-11-03 ENCOUNTER — Telehealth: Payer: Self-pay

## 2022-11-03 NOTE — Telephone Encounter (Signed)
Received notification from [employer] patient has stopped taking letrozole. Called patient she states she has not stopped this medication and has not had any issues with it. Patient aware of next appointment. - Disregard message -

## 2022-11-09 ENCOUNTER — Other Ambulatory Visit: Payer: Self-pay | Admitting: *Deleted

## 2022-11-10 MED ORDER — PREGABALIN 75 MG PO CAPS: ORAL_CAPSULE | ORAL | 3 refills | Status: DC

## 2022-11-26 ENCOUNTER — Encounter: Payer: Self-pay | Admitting: Genetic Counselor

## 2022-11-30 ENCOUNTER — Other Ambulatory Visit: Payer: Self-pay

## 2022-11-30 ENCOUNTER — Encounter: Payer: Self-pay | Admitting: Surgery

## 2022-11-30 ENCOUNTER — Ambulatory Visit (INDEPENDENT_AMBULATORY_CARE_PROVIDER_SITE_OTHER): Payer: 59 | Admitting: Surgery

## 2022-11-30 VITALS — BP 150/93 | HR 102 | Temp 98.0°F | Ht 66.0 in | Wt 187.0 lb

## 2022-11-30 DIAGNOSIS — Z9012 Acquired absence of left breast and nipple: Secondary | ICD-10-CM

## 2022-11-30 NOTE — Progress Notes (Signed)
Surgical Clinic Progress/Follow-up Note   HPI:  62 y.o. Female presents to clinic for right mammogram screening and left postmastectomy follow-up.  Last mammogram was the end of December of the right breast, and had no concerns for new developments, or other changes.  She had postmastectomy radiation as well, and right breast conservation for prior cancer with radiation to the right chest as well.  She does complain that her left lower axilla is still slightly tender at the prior drain site.  Review of Systems:  Constitutional: denies fever/chills  ENT: denies sore throat, hearing problems  Respiratory: denies shortness of breath, wheezing  Cardiovascular: denies chest pain, palpitations  Gastrointestinal: denies abdominal pain, N/V, or diarrhea/and bowel function as per interval history Skin: Denies any other rashes or skin discolorations except post-surgical wounds as per interval history  Vital Signs:  LMP 01/05/2017    Physical Exam:  Constitutional:  -- Normal body habitus  -- Awake, alert, and oriented x3  Pulmonary:  -- No crackles -- Equal breath sounds bilaterally -- Breathing non-labored at rest Cardiovascular:  -- S1, S2 present  -- No pericardial rubs  Gastrointestinal:  -- Soft and non-distended, non-tender GU  --Freda Munro is present as chaperone.  Left chest wall is free of any nodularity, skin changes or suspicious masses.  Radiation changes noted, slightly tender at the inferior most aspect where the drain had exited.  No evidence of seroma, or inflammation.  Right breast is consistent with postradiation changes lateral breast scars noted with axillary scar.  No dominant nor suspicious nodularity present.  Dense breast tissues present consistent with postradiation changes.  No dermal changes suggestive of recurrent cancer. Musculoskeletal / Integumentary:  -- Wounds or skin discoloration: None appreciated except post-radiation changes as described above  -- Extremities:  B/L UE and LE FROM, hands and feet warm, no edema    Imaging:  CLINICAL DATA:  Screening.   EXAM: DIGITAL SCREENING UNILATERAL RIGHT MAMMOGRAM WITH CAD AND TOMOSYNTHESIS   TECHNIQUE: Right screening digital craniocaudal and mediolateral oblique mammograms were obtained. Right screening digital breast tomosynthesis was performed. The images were evaluated with computer-aided detection.   COMPARISON:  Previous exam(s).   ACR Breast Density Category b: There are scattered areas of fibroglandular density.   FINDINGS: The patient has had a left mastectomy. There are no findings suspicious for malignancy.   IMPRESSION: No mammographic evidence of malignancy. A result letter of this screening mammogram will be mailed directly to the patient.   RECOMMENDATION: Screening mammogram in one year.  (Code:SM-R-55M)   BI-RADS CATEGORY  1: Negative.     Electronically Signed   By: Audie Pinto M.D.   On: 10/29/2022 14:38  Assessment:  62 y.o. yo Female with a problem list including...  Patient Active Problem List   Diagnosis Date Noted   Hypokalemia 10/10/2022   Flu 10/08/2022   Multifocal pneumonia 10/08/2022   Tachycardia 10/08/2022   Respiratory failure ([place]) 10/06/2022   Post-operative state 04/12/2022   Chemotherapy-induced neuropathy ([place]) 04/02/2022   Trochanteric bursitis of right hip 09/10/2021   Pain in joint of right hip 09/10/2021   S/P mastectomy, left 05/12/2021   Breast cancer metastasized to axillary lymph node, right ([place]) 05/06/2021   Prophylaxis for chemotherapy-induced neutropenia 01/16/2021   Genetic testing 11/26/2020   Goals of care, counseling/discussion 11/04/2020   Breast cancer, left ([place]) 11/04/2020   History of colonic polyps    Polyp of ascending colon    Depression 09/20/2017   DM2 (diabetes mellitus, type 2) (  [place]) 09/20/2017   Gout 09/20/2017   HTN (hypertension) 09/20/2017   Migraine 09/20/2017   History of endometrial ablation  02/26/2016   Malignant neoplasm of upper-outer quadrant of left breast in female, estrogen receptor positive ([place]) 08/04/2015    presents to clinic for follow-up evaluation of bilateral breast cancer, status postmastectomy on the left, progressing well.  Plan:              - return to clinic as needed, she sees Dr. Janese Banks multiple times per year, and finds no benefit from continuing to see me annually, as she performs the same examinations I do, and I would have to agree with her.  Instructed to call office if any questions or concerns arise  All of the above recommendations were discussed with the patient, and all of patient's questions were answered to her expressed satisfaction.  These notes generated with voice recognition software. I apologize for typographical errors.  [name], MD, FACS [place]: [place] for exceptional care. Office: [phone]

## 2022-11-30 NOTE — Patient Instructions (Signed)
Please call with any questions or concerns. You may follow up with Dr.Roa.

## 2022-12-20 ENCOUNTER — Other Ambulatory Visit: Payer: Self-pay

## 2022-12-20 DIAGNOSIS — Z170 Estrogen receptor positive status [ER+]: Secondary | ICD-10-CM

## 2022-12-21 ENCOUNTER — Inpatient Hospital Stay: Payer: 59 | Attending: Oncology

## 2022-12-21 ENCOUNTER — Inpatient Hospital Stay (HOSPITAL_BASED_OUTPATIENT_CLINIC_OR_DEPARTMENT_OTHER): Payer: 59 | Admitting: Oncology

## 2022-12-21 ENCOUNTER — Encounter: Payer: Self-pay | Admitting: Oncology

## 2022-12-21 ENCOUNTER — Inpatient Hospital Stay: Payer: 59

## 2022-12-21 VITALS — BP 141/102 | HR 117 | Temp 96.7°F | Resp 18 | Ht 66.0 in | Wt 191.0 lb

## 2022-12-21 DIAGNOSIS — G620 Drug-induced polyneuropathy: Secondary | ICD-10-CM | POA: Insufficient documentation

## 2022-12-21 DIAGNOSIS — C773 Secondary and unspecified malignant neoplasm of axilla and upper limb lymph nodes: Secondary | ICD-10-CM | POA: Diagnosis not present

## 2022-12-21 DIAGNOSIS — M25552 Pain in left hip: Secondary | ICD-10-CM | POA: Diagnosis not present

## 2022-12-21 DIAGNOSIS — Z170 Estrogen receptor positive status [ER+]: Secondary | ICD-10-CM | POA: Diagnosis not present

## 2022-12-21 DIAGNOSIS — C50012 Malignant neoplasm of nipple and areola, left female breast: Secondary | ICD-10-CM | POA: Diagnosis not present

## 2022-12-21 DIAGNOSIS — I10 Essential (primary) hypertension: Secondary | ICD-10-CM | POA: Insufficient documentation

## 2022-12-21 DIAGNOSIS — C50812 Malignant neoplasm of overlapping sites of left female breast: Secondary | ICD-10-CM | POA: Insufficient documentation

## 2022-12-21 DIAGNOSIS — Z79811 Long term (current) use of aromatase inhibitors: Secondary | ICD-10-CM | POA: Diagnosis not present

## 2022-12-21 DIAGNOSIS — M25551 Pain in right hip: Secondary | ICD-10-CM | POA: Diagnosis not present

## 2022-12-21 DIAGNOSIS — Z7983 Long term (current) use of bisphosphonates: Secondary | ICD-10-CM

## 2022-12-21 DIAGNOSIS — F17210 Nicotine dependence, cigarettes, uncomplicated: Secondary | ICD-10-CM | POA: Diagnosis not present

## 2022-12-21 DIAGNOSIS — Z808 Family history of malignant neoplasm of other organs or systems: Secondary | ICD-10-CM | POA: Diagnosis not present

## 2022-12-21 DIAGNOSIS — Z5181 Encounter for therapeutic drug level monitoring: Secondary | ICD-10-CM

## 2022-12-21 LAB — CMP (CANCER CENTER ONLY)
ALT: 15 U/L (ref 0–44)
AST: 24 U/L (ref 15–41)
Albumin: 4.3 g/dL (ref 3.5–5.0)
Alkaline Phosphatase: 63 U/L (ref 38–126)
Anion gap: 11 (ref 5–15)
BUN: 12 mg/dL (ref 6–20)
CO2: 23 mmol/L (ref 22–32)
Calcium: 9.4 mg/dL (ref 8.9–10.3)
Chloride: 103 mmol/L (ref 98–111)
Creatinine: 0.65 mg/dL (ref 0.44–1.00)
GFR, Estimated: >60 mL/min (ref >60)
Glucose, Bld: 96 mg/dL (ref 70–99)
Potassium: 3.5 mmol/L (ref 3.5–5.1)
Sodium: 137 mmol/L (ref 135–145)
Total Bilirubin: 0.6 mg/dL (ref 0.3–1.2)
Total Protein: 8.1 g/dL (ref 6.5–8.1)

## 2022-12-21 MED ORDER — ZOLEDRONIC ACID 4 MG/100ML IV SOLN
4.0000 mg | INTRAVENOUS | Status: DC
  Administered 2022-12-21: 4 mg via INTRAVENOUS
  Filled 2022-12-21: qty 100

## 2022-12-21 MED ORDER — SODIUM CHLORIDE 0.9 % IV SOLN
INTRAVENOUS | Status: DC
  Filled 2022-12-21: qty 250

## 2022-12-21 NOTE — Progress Notes (Signed)
Patient states that she does not have any questions.

## 2022-12-21 NOTE — Patient Instructions (Signed)

## 2022-12-22 ENCOUNTER — Encounter: Payer: Self-pay | Admitting: Oncology

## 2022-12-22 NOTE — Progress Notes (Signed)
Hematology/Oncology Consult note [facility]  [phone] [phone]  Patient Care Team: Casilda Carls, MD as PCP - General (Internal Medicine) Casilda Carls, MD as Consulting Physician (Internal Medicine) Christene Lye, MD (General Surgery) Garrel Ridgel, [place] as Consulting Physician (Podiatry) Sindy Guadeloupe, MD as Consulting Physician (Hematology and Oncology) [name], MD as Consulting Physician (General Surgery) Noreene Filbert, MD as Consulting Physician (Radiation Oncology)   Name of the patient: Michelle Walker  [id number]  19-Nov-1960   Date of visit: 12/22/22  Diagnosis- recurrent left breast cancer stage II [id number] ER/PR positive HER-2 negative    Chief complaint/ Reason for visit-routine follow-up of breast cancer and to receive Zometa  Heme/Onc history: Patient is a 62 year old female who was diagnosed with 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  She did not require adjuvant chemotherapy.  She was initially on tamoxifen which she could not tolerate and was switched to letrozole.  Last seen by me in June 2019 at which point she had a menstrual cycle and therefore not given another AI.  She had subsequently did not follow-up with me and remained off hormone therapy.     More recently patient underwent a diagnostic bilateral mammogram which showed a 2.2 x 1.9 x 1.9 cm mass at the 12 o'clock position of the left breast 1 cm from the nipple.  She was also found to have 5 other smaller breast masses ranging from 0.3 to 0.8 cm.  Noted to have a solitary left axillary lymph node with cortical thickening.  She had a biopsy of the dominant left breast mass as well as another smaller breast mass and the left axillary lymph node all of which were positive for metastatic invasive mammary carcinoma grade 2.  Tumor was ER 91 200% positive PR 71 to 80%  positive and HER-2 IHC equivocal but negative by FISH   MRI of the bilateral breasts showed no abnormal findings in the right breast.  At least 6 discrete hypoechoic masses in the left breast with the largest one measuring 2.2 cm with 1 abnormal left axillary lymph node.  The size of the other breast masses ranging from 11m to 1.4 cm.  The total area of masses and non-mass enhancement was about 10 x 5 x 6.5 cm.  CT chest abdomen and pelvis with contrast showed no evidence of distant metastatic disease.  Bone scan was also negative.   MammaPrint done on the biopsy specimen came back as high risk with greater than 12% chemotherapy benefit.  Plan is for neoadjuvant dose dense AC-Taxol chemotherapy followed by surgery.  Start of chemotherapy was delayed since patient tested positive for Covid   Patient completed neoadjuvant ddAC- taxol chemo between feb-June 2022.    Patient underwent left mastectomy with axillary lymph node dissection.  Final pathology showed 12 mm multifocal invasive mammary carcinoma at least 3 of them in number overall grade 2 with negative margins.  Metastatic carcinoma involving 3 out of 12 lymph nodes.  No extranodal extension identified.  Largest site of metastatic deposit 3 mm.  pT1c pN1a   Patient completed adjuvant radiation therapy in October 2022.  Patient is currently receiving Zometa every 3 months and will get that for 2 years.        Interval history-symptoms of peripheral neuropathyPersist and overall stable with Lyrica.  She denies any breast concerns.  Appetite and weight have remained stable.  She  does report joint pain especially in bilateral hips as well as hands  ECOG PS- 1 Pain scale- 3 Opioid associated constipation- no  Review of systems- Review of Systems  Constitutional:  Negative for chills, fever, malaise/fatigue and weight loss.  HENT:  Negative for congestion, ear discharge and nosebleeds.   Eyes:  Negative for blurred vision.  Respiratory:   Negative for cough, hemoptysis, sputum production, shortness of breath and wheezing.   Cardiovascular:  Negative for chest pain, palpitations, orthopnea and claudication.  Gastrointestinal:  Negative for abdominal pain, blood in stool, constipation, diarrhea, heartburn, melena, nausea and vomiting.  Genitourinary:  Negative for dysuria, flank pain, frequency, hematuria and urgency.  Musculoskeletal:  Positive for joint pain. Negative for back pain and myalgias.  Skin:  Negative for rash.  Neurological:  Positive for sensory change (Peripheral neuropathy). Negative for dizziness, tingling, focal weakness, seizures, weakness and headaches.  Endo/Heme/Allergies:  Does not bruise/bleed easily.  Psychiatric/Behavioral:  Negative for depression and suicidal ideas. The patient does not have insomnia.       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 Wide excision/ RT, T1a,N0. ER/PR pos, Her 2.neg. Tamoxifen x 6 months, d/c secondary to vasomotor sympotms.    Headache    Hypertension    Peripheral neuropathy    Personal history of radiation therapy 2016   RIGHT lumpectomy   Thyroid disease      Past Surgical History:  Procedure Laterality Date   ANTERIOR CRUCIATE LIGAMENT REPAIR Right    BREAST BIOPSY Right 07/17/2015   INVASIVE LOBULAR CARCINOMA, CLASSIC TYPE (1.5 MM), ARISING IN A    BREAST BIOPSY Left 09/19/2020   [place] bx 3 areas/ 1oc q clip/ 12 oc vision clip, axilla lymph node hydromark shape 3/ path pending   BREAST LUMPECTOMY Right 08/22/2015   BREAST LUMPECTOMY WITH SENTINEL LYMPH NODE BIOPSY Right 08/22/2015   Procedure: BREAST LUMPECTOMY WITH SENTINEL LYMPH NODE BX;  Surgeon: Christene Lye, MD;  Location:  [facility];  Service: General;  Laterality: Right;   COLONOSCOPY W/ POLYPECTOMY  2014   COLONOSCOPY WITH PROPOFOL N/A 04/16/2019   Procedure: COLONOSCOPY WITH BIOPSIES;  Surgeon: Lucilla Lame, MD;  Location: [place];  Service: Endoscopy;  Laterality: N/A;   DILATION AND CURETTAGE OF UTERUS     20 years ago   LAPAROSCOPIC VAGINAL HYSTERECTOMY WITH SALPINGO OOPHORECTOMY Bilateral 04/12/2022   Procedure: LAPAROSCOPIC ASSISTED VAGINAL HYSTERECTOMY WITH SALPINGO OOPHORECTOMY;  Surgeon: Harlin Heys, MD;  Location: [facility];  Service: Gynecology;  Laterality: Bilateral;   MASTECTOMY Left    2021   NOVASURE ABLATION     POLYPECTOMY N/A 04/16/2019   Procedure: POLYPECTOMY;  Surgeon: Lucilla Lame, MD;  Location: [place];  Service: Endoscopy;  Laterality: N/A;   PORTACATH PLACEMENT Right 12/01/2020   Procedure: INSERTION PORT-A-CATH;  Surgeon: [name], MD;  Location: [facility];  Service: General;  Laterality: Right;   TOTAL MASTECTOMY Left 05/06/2021   Procedure: modified radical mastectomy;  Surgeon: [name], MD;  Location: [facility];  Service: General;  Laterality: Left;  Provider requesting 1.5 hours / 90 minutes for procedure    Social History   Socioeconomic History  Marital status: Married    Spouse name: Vick   Number of children: 1   Years of education: Not on file   Highest education level: Not on file  Occupational History   Not on file  Tobacco Use   Smoking status: Every Day    Packs/day: 0.75    Years: 40.00    Total pack years: 30.00    Types: Cigarettes   Smokeless tobacco: Never  Vaping Use   Vaping Use: Never used  Substance and Sexual Activity   Alcohol use: Yes    Alcohol/week: 2.0 standard drinks of alcohol    Types: 2 Standard drinks or equivalent per week    Comment: BEER 1-2 QD   Drug use: No   Sexual activity: Yes    Birth control/protection: Surgical    Comment: Hysterectomy  Other Topics Concern   Not on file   Social History Narrative   Daughter and 3 kids in home, her Brother is in the home as well.   Social Determinants of Health   Financial Resource Strain: Not on file  Food Insecurity: Not on file  Transportation Needs: Not on file  Physical Activity: Not on file  Stress: Not on file  Social Connections: Not on file  Intimate Partner Violence: Not on file    Family History  Problem Relation Age of Onset   Stroke Mother    Cancer Sister        sarcoma on arm; chemo   Diabetes Sister    Stroke Sister    Diabetes Brother    Breast cancer Neg Hx    Ovarian cancer Neg Hx    Colon cancer Neg Hx    Heart disease Neg Hx      Current Outpatient Medications:    albuterol (VENTOLIN HFA) 108 (90 Base) MCG/ACT inhaler, Inhale 2 puffs into the lungs every 6 (six) hours as needed for wheezing or shortness of breath., Disp: 8 g, Rfl: 2   allopurinol (ZYLOPRIM) 100 MG tablet, Take 100 mg by mouth at bedtime., Disp: , Rfl:    amLODipine (NORVASC) 10 MG tablet, Take 20 mg by mouth at bedtime., Disp: , Rfl: 0   atorvastatin (LIPITOR) 10 MG tablet, Take 10 mg by mouth daily., Disp: , Rfl:    Calcium Carb-Cholecalciferol (CALCIUM 600+D3 PO), Take 1 tablet by mouth at bedtime., Disp: , Rfl:    letrozole (FEMARA) 2.5 MG tablet, TAKE 1 TABLET(2.5 MG) BY MOUTH DAILY. START AFTER RADIATION IS OVER, Disp: 90 tablet, Rfl: 1   losartan (COZAAR) 100 MG tablet, Take 1 tablet by mouth daily., Disp: , Rfl:    metoprolol succinate (TOPROL-XL) 25 MG 24 hr tablet, Take 50 mg by mouth at bedtime., Disp: , Rfl: 0   potassium chloride SA (KLOR-CON M) 20 MEQ tablet, Take 2 tablets (40 mEq total) by mouth daily., Disp: 30 tablet, Rfl: 2   pregabalin (LYRICA) 75 MG capsule, TAKE 1 CAPSULE(75 MG) BY MOUTH TWICE DAILY, Disp: 60 capsule, Rfl: 3 No current facility-administered medications for this visit.  Facility-Administered Medications Ordered in Other Visits:    sodium chloride flush (NS) 0.9 % injection 10 mL, 10  mL, Intravenous, PRN, Sindy Guadeloupe, MD, 10 mL at 03/27/21 0813  Physical exam:  Vitals:   12/21/22 1320  BP: (!) 141/102  Pulse: (!) 117  Resp: 18  Temp: (!) 96.7 F (35.9 C)  TempSrc: Tympanic  Weight: 191 lb (86.6 kg)  Height: 5' 6"$  (1.676 m)   Physical Exam  Cardiovascular:     Rate and Rhythm: Normal rate and regular rhythm.     Heart sounds: Normal heart sounds.  Pulmonary:     Effort: Pulmonary effort is normal.     Breath sounds: Normal breath sounds.  Abdominal:     General: Bowel sounds are normal.     Palpations: Abdomen is soft.  Skin:    General: Skin is warm and dry.  Neurological:     Mental Status: She is alert and oriented to person, place, and time.   Breast exam: Patient is s/p right lumpectomy.  No palpable masses in the right breast.  Changes of chronic induration from prior radiation.  No palpable bilateral axillary adenopathy.  Patient is s/p left mastectomy without reconstruction.  No evidence of chest wall recurrence.     Latest Ref Rng & Units 12/21/2022    1:07 PM  CMP  Glucose 70 - 99 mg/dL 96   BUN 6 - 20 mg/dL 12   Creatinine 0.44 - 1.00 mg/dL 0.65   Sodium 135 - 145 mmol/L 137   Potassium 3.5 - 5.1 mmol/L 3.5   Chloride 98 - 111 mmol/L 103   CO2 22 - 32 mmol/L 23   Calcium 8.9 - 10.3 mg/dL 9.4   Total Protein 6.5 - 8.1 g/dL 8.1   Total Bilirubin 0.3 - 1.2 mg/dL 0.6   Alkaline Phos 38 - 126 U/L 63   AST 15 - 41 U/L 24   ALT 0 - 44 U/L 15       Latest Ref Rng & Units 10/09/2022    5:16 AM  CBC  WBC 4.0 - 10.5 K/uL 9.5   Hemoglobin 12.0 - 15.0 g/dL 12.3   Hematocrit 36.0 - 46.0 % 38.6   Platelets 150 - 400 K/uL 199      Assessment and plan- Patient is a 62 y.o. female  history of right breast cancer s/p lumpectomy now with anatomical stage IIA left breast invasive mammary carcinoma [id number] ER/PR positive and HER-2 negative.  she is s/p 4 cycles of dose dense AC and 12 weekly cycles of taxol neoadjuvant chemotherapy.  Patient  underwent left mastectomy without reconstruction and final pathology showed multifocal invasive mammary carcinoma [id number].   She is here for a routine follow-up  Clinically patient is doing wellWith no concerning signs and symptoms of recurrence based on today's exam.  She had a right breast mammogram in December 2023 which was unremarkable.  She is receiving Zometa every 3 months and will complete it in November 2024.  Calcium levels acceptable to proceed with Zometa today.  She will also continue letrozole along with calcium and vitamin D  Arthralgia: Possibly secondary to letrozole.  I have recommended that she can hold letrozole for a month and see how she feels.  We could always try another AI and see if that would be told rated better.  Chemo-induced peripheral neuropathy: Continue Lyrica  CMP and Zometa in 3 months in 6 months and I will see her back in 6 months   Visit Diagnosis 1. Malignant neoplasm of areola of left breast in female, estrogen receptor positive ([place])   2. Use of letrozole ([place]em[place])   3. Encounter for monitoring zoledronic acid therapy      Dr. [name], MD, MPH [facility] [id number] 12/22/2022 1:17 PM

## 2023-01-10 ENCOUNTER — Other Ambulatory Visit: Payer: Self-pay | Admitting: *Deleted

## 2023-01-10 MED ORDER — DULOXETINE HCL 60 MG PO CPEP: 60.0000 mg | ORAL_CAPSULE | Freq: Every day | ORAL | 1 refills | Status: DC

## 2023-01-10 NOTE — Telephone Encounter (Signed)
Please advise. Pharmacy sent a refill request for the duloxetine 60 mgs. However this script was d/c by Josh Dec 2023

## 2023-03-16 ENCOUNTER — Other Ambulatory Visit: Payer: Self-pay | Admitting: *Deleted

## 2023-03-16 MED ORDER — DULOXETINE HCL 60 MG PO CPEP: 60.0000 mg | ORAL_CAPSULE | Freq: Every day | ORAL | 1 refills | Status: DC

## 2023-03-21 ENCOUNTER — Inpatient Hospital Stay: Payer: 59

## 2023-03-21 ENCOUNTER — Inpatient Hospital Stay: Payer: 59 | Attending: Oncology

## 2023-04-01 ENCOUNTER — Other Ambulatory Visit: Payer: Self-pay

## 2023-04-04 ENCOUNTER — Inpatient Hospital Stay: Payer: 59

## 2023-04-04 ENCOUNTER — Inpatient Hospital Stay: Payer: 59 | Attending: Oncology

## 2023-04-04 VITALS — BP 141/100 | HR 113 | Resp 18 | Ht 66.0 in | Wt 189.5 lb

## 2023-04-04 DIAGNOSIS — R069 Unspecified abnormalities of breathing: Secondary | ICD-10-CM

## 2023-04-04 DIAGNOSIS — Z79811 Long term (current) use of aromatase inhibitors: Secondary | ICD-10-CM | POA: Insufficient documentation

## 2023-04-04 DIAGNOSIS — C50012 Malignant neoplasm of nipple and areola, left female breast: Secondary | ICD-10-CM

## 2023-04-04 DIAGNOSIS — Z170 Estrogen receptor positive status [ER+]: Secondary | ICD-10-CM | POA: Insufficient documentation

## 2023-04-04 LAB — COMPREHENSIVE METABOLIC PANEL
ALT: 14 U/L (ref 0–44)
AST: 26 U/L (ref 15–41)
Albumin: 3.9 g/dL (ref 3.5–5.0)
Alkaline Phosphatase: 65 U/L (ref 38–126)
Anion gap: 10 (ref 5–15)
BUN: 10 mg/dL (ref 6–20)
CO2: 21 mmol/L — ABNORMAL LOW (ref 22–32)
Calcium: 9.2 mg/dL (ref 8.9–10.3)
Chloride: 104 mmol/L (ref 98–111)
Creatinine, Ser: 0.64 mg/dL (ref 0.44–1.00)
GFR, Estimated: >60 mL/min (ref >60)
Glucose, Bld: 117 mg/dL — ABNORMAL HIGH (ref 70–99)
Potassium: 3.1 mmol/L — ABNORMAL LOW (ref 3.5–5.1)
Sodium: 135 mmol/L (ref 135–145)
Total Bilirubin: 0.4 mg/dL (ref 0.3–1.2)
Total Protein: 7.4 g/dL (ref 6.5–8.1)

## 2023-04-04 MED ORDER — SODIUM CHLORIDE 0.9 % IV SOLN
Freq: Once | INTRAVENOUS | Status: AC
  Filled 2023-04-04: qty 250

## 2023-04-04 MED ORDER — ZOLEDRONIC ACID 4 MG/100ML IV SOLN
4.0000 mg | INTRAVENOUS | Status: DC
  Administered 2023-04-04: 4 mg via INTRAVENOUS
  Filled 2023-04-04: qty 100

## 2023-04-04 NOTE — Patient Instructions (Signed)
[facility]  Discharge Instructions: Thank you for choosing [facility] to provide your oncology and hematology care.  If you have a lab appointment with the Cancer Center, please go directly to the Cancer Center and check in at the registration area.  Wear comfortable clothing and clothing appropriate for easy access to any Portacath or PICC line.   We strive to give you quality time with your provider. You may need to reschedule your appointment if you arrive late (15 or more minutes).  Arriving late affects you and other patients whose appointments are after yours.  Also, if you miss three or more appointments without notifying the office, you may be dismissed from the clinic at the provider's discretion.      For prescription refill requests, have your pharmacy contact our office and allow 72 hours for refills to be completed.    Today you received the following chemotherapy and/or immunotherapy agents Zometa      To help prevent nausea and vomiting after your treatment, we encourage you to take your nausea medication as directed.  BELOW ARE SYMPTOMS THAT SHOULD BE REPORTED IMMEDIATELY: *FEVER GREATER THAN 100.4 F (38 C) OR HIGHER *CHILLS OR SWEATING *NAUSEA AND VOMITING THAT IS NOT CONTROLLED WITH YOUR NAUSEA MEDICATION *UNUSUAL SHORTNESS OF BREATH *UNUSUAL BRUISING OR BLEEDING *URINARY PROBLEMS (pain or burning when urinating, or frequent urination) *BOWEL PROBLEMS (unusual diarrhea, constipation, pain near the anus) TENDERNESS IN MOUTH AND THROAT WITH OR WITHOUT PRESENCE OF ULCERS (sore throat, sores in mouth, or a toothache) UNUSUAL RASH, SWELLING OR PAIN  UNUSUAL VAGINAL DISCHARGE OR ITCHING   Items with * indicate a potential emergency and should be followed up as soon as possible or go to the Emergency Department if any problems should occur.  Please show the CHEMOTHERAPY ALERT CARD or IMMUNOTHERAPY ALERT CARD at check-in to the  Emergency Department and triage nurse.  Should you have questions after your visit or need to cancel or reschedule your appointment, please contact [facility] [place] [facility]  [phone] and follow the prompts.  Office hours are 8:00 a.m. to 4:30 p.m. Monday - Friday. Please note that voicemails left after 4:00 p.m. may not be returned until the following business day.  We are closed weekends and major holidays. You have access to a nurse at all times for urgent questions. Please call the main number to the clinic [phone] and follow the prompts.  For any non-urgent questions, you may also contact your provider using MyChart. We now offer e-Visits for anyone 18 and older to request care online for non-urgent symptoms. For details visit [email or web address].   Also download the MyChart app! Go to the app store, search "MyChart", open the app, select [facility], and log in with your MyChart username and password.    

## 2023-04-21 ENCOUNTER — Other Ambulatory Visit: Payer: Self-pay | Admitting: *Deleted

## 2023-04-21 MED ORDER — PREGABALIN 75 MG PO CAPS: ORAL_CAPSULE | ORAL | 3 refills | Status: DC

## 2023-04-22 ENCOUNTER — Other Ambulatory Visit: Payer: Self-pay | Admitting: Internal Medicine

## 2023-04-22 DIAGNOSIS — R7989 Other specified abnormal findings of blood chemistry: Secondary | ICD-10-CM

## 2023-05-02 ENCOUNTER — Encounter
Admission: RE | Admit: 2023-05-02 | Discharge: 2023-05-02 | Disposition: A | Discharge status: Home / Self Care | Payer: 59 | Source: Ambulatory Visit | Attending: Internal Medicine | Admitting: Internal Medicine

## 2023-05-02 DIAGNOSIS — R7989 Other specified abnormal findings of blood chemistry: Secondary | ICD-10-CM | POA: Diagnosis not present

## 2023-05-02 MED ORDER — SODIUM IODIDE I-123 7.4 MBQ CAPS
448.0000 | ORAL_CAPSULE | Freq: Once | ORAL | Status: AC
  Administered 2023-05-02: 448 via ORAL

## 2023-05-03 ENCOUNTER — Encounter
Admission: RE | Admit: 2023-05-03 | Discharge: 2023-05-03 | Disposition: A | Discharge status: Home / Self Care | Payer: 59 | Source: Ambulatory Visit | Attending: Internal Medicine | Admitting: Internal Medicine

## 2023-05-09 ENCOUNTER — Encounter: Payer: Self-pay | Admitting: Cardiovascular Disease

## 2023-05-09 ENCOUNTER — Ambulatory Visit (INDEPENDENT_AMBULATORY_CARE_PROVIDER_SITE_OTHER): Payer: 59 | Admitting: Cardiovascular Disease

## 2023-05-09 VITALS — BP 162/92 | HR 112 | Ht 66.0 in | Wt 192.0 lb

## 2023-05-09 DIAGNOSIS — I351 Nonrheumatic aortic (valve) insufficiency: Secondary | ICD-10-CM

## 2023-05-09 DIAGNOSIS — F17210 Nicotine dependence, cigarettes, uncomplicated: Secondary | ICD-10-CM | POA: Diagnosis not present

## 2023-05-09 DIAGNOSIS — R7989 Other specified abnormal findings of blood chemistry: Secondary | ICD-10-CM

## 2023-05-09 DIAGNOSIS — I340 Nonrheumatic mitral (valve) insufficiency: Secondary | ICD-10-CM | POA: Diagnosis not present

## 2023-05-09 DIAGNOSIS — I10 Essential (primary) hypertension: Secondary | ICD-10-CM

## 2023-05-09 DIAGNOSIS — R0602 Shortness of breath: Secondary | ICD-10-CM

## 2023-05-09 DIAGNOSIS — R000 Tachycardia, unspecified: Secondary | ICD-10-CM

## 2023-05-09 DIAGNOSIS — Z720 Tobacco use: Secondary | ICD-10-CM

## 2023-05-09 DIAGNOSIS — I2585 Chronic coronary microvascular dysfunction: Secondary | ICD-10-CM

## 2023-05-09 MED ORDER — SPIRONOLACTONE 25 MG PO TABS: 25.0000 mg | ORAL_TABLET | Freq: Every day | ORAL | 11 refills | Status: DC

## 2023-05-09 MED ORDER — METOPROLOL SUCCINATE ER 100 MG PO TB24: 100.0000 mg | ORAL_TABLET | Freq: Every day | ORAL | 11 refills | Status: DC

## 2023-05-09 NOTE — Patient Instructions (Signed)
Take 1 tab  metoprolol night before and 1  tab 90 minutes prior to ccta 

## 2023-05-09 NOTE — Progress Notes (Signed)
Cardiology Office Note   Date:  05/09/2023   ID:  Michelle Walker, DOB Mar 19, 1961, MRN [id number]  PCP:  Sherrie Mustache, MD  Cardiologist:  Adrian Blackwater, MD      History of Present Illness: Michelle Walker is a 61 y.o. female who presents for  Chief Complaint  Patient presents with   Follow-up    Consult    Has uncontrolled blood presssure, but no chest pain but gets SOB.Had troponins 0ver 419 on 10/06/22, but nuclear stress test was normal at Dr.Jadali      Past Medical History:  Diagnosis Date   Anemia    Arthritis    Breast cancer (HCC) 08/2015   1.5 mm invasive lobular cancer, 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 Wide excision/ RT, T1a,N0. ER/PR pos, Her 2.neg. Tamoxifen x 6 months, d/c secondary to vasomotor sympotms.    Headache    Hypertension    Peripheral neuropathy    Personal history of radiation therapy 2016   RIGHT lumpectomy   Thyroid disease      Past Surgical History:  Procedure Laterality Date   ANTERIOR CRUCIATE LIGAMENT REPAIR Right    BREAST BIOPSY Right 07/17/2015   INVASIVE LOBULAR CARCINOMA, CLASSIC TYPE (1.5 MM), ARISING IN A    BREAST BIOPSY Left 09/19/2020   [place] bx 3 areas/ 1oc q clip/ 12 oc vision clip, axilla lymph node hydromark shape 3/ path pending   BREAST LUMPECTOMY Right 08/22/2015   BREAST LUMPECTOMY WITH SENTINEL LYMPH NODE BIOPSY Right 08/22/2015   Procedure: BREAST LUMPECTOMY WITH SENTINEL LYMPH NODE BX;  Surgeon: Kieth Brightly, MD;  Location: [facility];  Service: General;  Laterality: Right;   COLONOSCOPY W/ POLYPECTOMY  2014   COLONOSCOPY WITH PROPOFOL N/A 04/16/2019   Procedure: COLONOSCOPY WITH BIOPSIES;  Surgeon: Midge Minium, MD;  Location: [facility];  Service: Endoscopy;  Laterality: N/A;   DILATION AND CURETTAGE OF UTERUS     20 years ago   LAPAROSCOPIC VAGINAL HYSTERECTOMY WITH SALPINGO OOPHORECTOMY Bilateral 04/12/2022   Procedure: LAPAROSCOPIC ASSISTED VAGINAL HYSTERECTOMY WITH SALPINGO OOPHORECTOMY;  Surgeon:  Linzie Collin, MD;  Location: [facility];  Service: Gynecology;  Laterality: Bilateral;   MASTECTOMY Left    2021   NOVASURE ABLATION     POLYPECTOMY N/A 04/16/2019   Procedure: POLYPECTOMY;  Surgeon: Midge Minium, MD;  Location: [facility];  Service: Endoscopy;  Laterality: N/A;   PORTACATH PLACEMENT Right 12/01/2020   Procedure: INSERTION PORT-A-CATH;  Surgeon: Campbell Lerner, MD;  Location: [facility];  Service: General;  Laterality: Right;   TOTAL MASTECTOMY Left 05/06/2021   Procedure: modified radical mastectomy;  Surgeon: Campbell Lerner, MD;  Location: [facility];  Service: General;  Laterality: Left;  Provider requesting 1.5 hours / 90 minutes for procedure     Current Outpatient Medications  Medication Sig Dispense Refill   metoprolol succinate (TOPROL XL) 100 MG 24 hr tablet Take 1 tablet (100 mg total) by mouth daily. Take with or immediately following a meal. 30 tablet 11   spironolactone (ALDACTONE) 25 MG tablet Take 1 tablet (25 mg total) by mouth daily. 30 tablet 11   albuterol (VENTOLIN HFA) 108 (90 Base) MCG/ACT inhaler Inhale 2 puffs into the lungs every 6 (six) hours as needed for wheezing or shortness of breath. 8 g 2   allopurinol (ZYLOPRIM) 100 MG tablet Take 100 mg by mouth at bedtime.     atorvastatin (LIPITOR) 10 MG tablet Take 10 mg by mouth daily.     Calcium  Carb-Cholecalciferol (CALCIUM 600+D3 PO) Take 1 tablet by mouth at bedtime.     DULoxetine (CYMBALTA) 60 MG capsule Take 1 capsule (60 mg total) by mouth daily. 30 capsule 1   letrozole (FEMARA) 2.5 MG tablet TAKE 1 TABLET(2.5 MG) BY MOUTH DAILY. START AFTER RADIATION IS OVER 90 tablet 1   losartan (COZAAR) 100 MG tablet Take 1 tablet by mouth daily.     pregabalin (LYRICA) 75 MG capsule TAKE 1 CAPSULE(75 MG) BY MOUTH TWICE DAILY 60 capsule 3   No current facility-administered medications for this visit.   Facility-Administered Medications Ordered in Other Visits  Medication Dose Route  Frequency Provider Last Rate Last Admin   sodium chloride flush (NS) 0.9 % injection 10 mL  10 mL Intravenous PRN Creig Hines, MD   10 mL at 03/27/21 0813    Allergies:   Triamcinolone and Tape    Social History:   reports that she has been smoking cigarettes. She has a 30.00 pack-year smoking history. She has never used smokeless tobacco. She reports current alcohol use of about 2.0 standard drinks of alcohol per week. She reports that she does not use drugs.   Family History:  family history includes Cancer in her sister; Diabetes in her brother and sister; Stroke in her mother and sister.    ROS:     Review of Systems  Constitutional: Negative.   HENT: Negative.    Eyes: Negative.   Respiratory: Negative.    Gastrointestinal: Negative.   Genitourinary: Negative.   Musculoskeletal: Negative.   Skin: Negative.   Neurological: Negative.   Endo/Heme/Allergies: Negative.   Psychiatric/Behavioral: Negative.    All other systems reviewed and are negative.     All other systems are reviewed and negative.    PHYSICAL EXAM: VS:  BP (!) 162/92   Pulse (!) 112   Ht 5\' 6"  (1.676 m)   Wt 192 lb (87.1 kg)   LMP 01/05/2017   SpO2 95%   BMI 30.99 kg/m  , BMI Body mass index is 30.99 kg/m. Last weight:  Wt Readings from Last 3 Encounters:  05/09/23 192 lb (87.1 kg)  04/04/23 189 lb 7.8 oz (86 kg)  12/21/22 191 lb (86.6 kg)     Physical Exam Constitutional:      Appearance: Normal appearance.  Cardiovascular:     Rate and Rhythm: Normal rate and regular rhythm.     Heart sounds: Normal heart sounds.  Pulmonary:     Effort: Pulmonary effort is normal.     Breath sounds: Normal breath sounds.  Musculoskeletal:     Right lower leg: No edema.     Left lower leg: No edema.  Neurological:     Mental Status: She is alert.       EKG: sinus tachycardia 135/min no acute changes 10/06/22  Recent Labs: 10/06/2022: B Natriuretic Peptide 549.8 10/08/2022: Magnesium  2.7 10/09/2022: Hemoglobin 12.3; Platelets 199 04/04/2023: ALT 14; BUN 10; Creatinine, Ser 0.64; Potassium 3.1; Sodium 135    Lipid Panel    Component Value Date/Time   CHOL 161 01/29/2021 0827   TRIG 119 01/29/2021 0827   HDL 42 01/29/2021 0827   CHOLHDL 3.8 01/29/2021 0827   VLDL 24 01/29/2021 0827   LDLCALC 95 01/29/2021 0827      Other studies Reviewed: Additional studies/ records that were reviewed today include:  Review of the above records demonstrates:       No data to display  ASSESSMENT AND PLAN:    ICD-10-CM   1. SOB (shortness of breath)  R06.02 metoprolol succinate (TOPROL XL) 100 MG 24 hr tablet    spironolactone (ALDACTONE) 25 MG tablet    CT CORONARY MORPH W/CTA COR W/SCORE W/CA W/CM &/OR WO/CM    Basic metabolic panel   Advise ccta as stress test normal, but tropnins 419, r/o CAD by CCTA    2. Chronic coronary microvascular dysfunction  I25.85 metoprolol succinate (TOPROL XL) 100 MG 24 hr tablet    spironolactone (ALDACTONE) 25 MG tablet    CT CORONARY MORPH W/CTA COR W/SCORE W/CA W/CM &/OR WO/CM    Basic metabolic panel    3. Primary hypertension  I10 metoprolol succinate (TOPROL XL) 100 MG 24 hr tablet    spironolactone (ALDACTONE) 25 MG tablet    CT CORONARY MORPH W/CTA COR W/SCORE W/CA W/CM &/OR WO/CM    Basic metabolic panel   change metoprolol 100, stop amlodapine , kcl, and start aldactone. repeat 140/95    4. Nonrheumatic mitral valve regurgitation  I34.0 metoprolol succinate (TOPROL XL) 100 MG 24 hr tablet    spironolactone (ALDACTONE) 25 MG tablet    CT CORONARY MORPH W/CTA COR W/SCORE W/CA W/CM &/OR WO/CM    Basic metabolic panel   trace on echo12/7/23    5. Nonrheumatic aortic valve insufficiency  I35.1 metoprolol succinate (TOPROL XL) 100 MG 24 hr tablet    spironolactone (ALDACTONE) 25 MG tablet    CT CORONARY MORPH W/CTA COR W/SCORE W/CA W/CM &/OR WO/CM    Basic metabolic panel   trace    6. Tobacco use  Z72.0  metoprolol succinate (TOPROL XL) 100 MG 24 hr tablet    spironolactone (ALDACTONE) 25 MG tablet    CT CORONARY MORPH W/CTA COR W/SCORE W/CA W/CM &/OR WO/CM    Basic metabolic panel    7. Tachycardia  R00.0 metoprolol succinate (TOPROL XL) 100 MG 24 hr tablet    spironolactone (ALDACTONE) 25 MG tablet    CT CORONARY MORPH W/CTA COR W/SCORE W/CA W/CM &/OR WO/CM    Basic metabolic panel   EKG 134/min sinus tach 10/06/22    8. Elevated troponin  R79.89    Negative stress test, but on 10/06/22 positive tropnins elevated       Problem List Items Addressed This Visit       Cardiovascular and Mediastinum   Primary hypertension (Chronic)   Relevant Medications   metoprolol succinate (TOPROL XL) 100 MG 24 hr tablet   spironolactone (ALDACTONE) 25 MG tablet   Other Relevant Orders   CT CORONARY MORPH W/CTA COR W/SCORE W/CA W/CM &/OR WO/CM   Basic metabolic panel     Other   Tobacco use (Chronic)   Relevant Medications   metoprolol succinate (TOPROL XL) 100 MG 24 hr tablet   spironolactone (ALDACTONE) 25 MG tablet   Other Relevant Orders   CT CORONARY MORPH W/CTA COR W/SCORE W/CA W/CM &/OR WO/CM   Basic metabolic panel   Tachycardia   Relevant Medications   metoprolol succinate (TOPROL XL) 100 MG 24 hr tablet   spironolactone (ALDACTONE) 25 MG tablet   Other Relevant Orders   CT CORONARY MORPH W/CTA COR W/SCORE W/CA W/CM &/OR WO/CM   Basic metabolic panel   Other Visit Diagnoses     SOB (shortness of breath)    -  Primary   Advise ccta as stress test normal, but tropnins 419, r/o CAD by CCTA   Relevant Medications   metoprolol succinate (TOPROL  XL) 100 MG 24 hr tablet   spironolactone (ALDACTONE) 25 MG tablet   Other Relevant Orders   CT CORONARY MORPH W/CTA COR W/SCORE W/CA W/CM &/OR WO/CM   Basic metabolic panel   Chronic coronary microvascular dysfunction       Relevant Medications   metoprolol succinate (TOPROL XL) 100 MG 24 hr tablet   spironolactone (ALDACTONE) 25 MG  tablet   Other Relevant Orders   CT CORONARY MORPH W/CTA COR W/SCORE W/CA W/CM &/OR WO/CM   Basic metabolic panel   Nonrheumatic mitral valve regurgitation       trace on echo12/7/23   Relevant Medications   metoprolol succinate (TOPROL XL) 100 MG 24 hr tablet   spironolactone (ALDACTONE) 25 MG tablet   Other Relevant Orders   CT CORONARY MORPH W/CTA COR W/SCORE W/CA W/CM &/OR WO/CM   Basic metabolic panel   Nonrheumatic aortic valve insufficiency       trace   Relevant Medications   metoprolol succinate (TOPROL XL) 100 MG 24 hr tablet   spironolactone (ALDACTONE) 25 MG tablet   Other Relevant Orders   CT CORONARY MORPH W/CTA COR W/SCORE W/CA W/CM &/OR WO/CM   Basic metabolic panel   Elevated troponin       Negative stress test, but on 10/06/22 positive tropnins elevated          Disposition:   Return in about 1 week (around 05/16/2023) for ccta and f/u.    Total time spent: 50 minutes  Signed,  Adrian Blackwater, MD  05/09/2023 4:12 PM    [facility]

## 2023-05-10 ENCOUNTER — Other Ambulatory Visit: Payer: 59

## 2023-05-11 ENCOUNTER — Other Ambulatory Visit: Payer: Self-pay | Admitting: Oncology

## 2023-05-11 LAB — BASIC METABOLIC PANEL
BUN/Creatinine Ratio: 18 (ref 12–28)
BUN: 13 mg/dL (ref 8–27)
CO2: 15 mmol/L — ABNORMAL LOW (ref 20–29)
Calcium: 10 mg/dL (ref 8.7–10.3)
Chloride: 105 mmol/L (ref 96–106)
Creatinine, Ser: 0.73 mg/dL (ref 0.57–1.00)
Glucose: 83 mg/dL (ref 70–99)
Potassium: 4.7 mmol/L (ref 3.5–5.2)
Sodium: 140 mmol/L (ref 134–144)
eGFR: 94 mL/min/{1.73_m2} (ref >59)

## 2023-05-17 ENCOUNTER — Ambulatory Visit (INDEPENDENT_AMBULATORY_CARE_PROVIDER_SITE_OTHER): Payer: 59

## 2023-05-17 DIAGNOSIS — I351 Nonrheumatic aortic (valve) insufficiency: Secondary | ICD-10-CM

## 2023-05-17 DIAGNOSIS — Z720 Tobacco use: Secondary | ICD-10-CM

## 2023-05-17 DIAGNOSIS — I2585 Chronic coronary microvascular dysfunction: Secondary | ICD-10-CM

## 2023-05-17 DIAGNOSIS — R0602 Shortness of breath: Secondary | ICD-10-CM

## 2023-05-17 DIAGNOSIS — I340 Nonrheumatic mitral (valve) insufficiency: Secondary | ICD-10-CM

## 2023-05-17 DIAGNOSIS — R000 Tachycardia, unspecified: Secondary | ICD-10-CM

## 2023-05-17 DIAGNOSIS — I10 Essential (primary) hypertension: Secondary | ICD-10-CM

## 2023-05-17 MED ORDER — IOHEXOL 350 MG/ML SOLN
100.0000 mL | Freq: Once | INTRAVENOUS | Status: AC | PRN
  Administered 2023-05-17: 100 mL via INTRAVENOUS

## 2023-05-20 ENCOUNTER — Ambulatory Visit: Payer: 59 | Admitting: Cardiovascular Disease

## 2023-05-23 ENCOUNTER — Ambulatory Visit: Payer: 59 | Admitting: Cardiovascular Disease

## 2023-05-31 ENCOUNTER — Ambulatory Visit: Payer: 59 | Admitting: Cardiology

## 2023-05-31 ENCOUNTER — Encounter: Payer: Self-pay | Admitting: Cardiology

## 2023-05-31 VITALS — BP 130/90 | HR 115 | Ht 66.0 in | Wt 191.2 lb

## 2023-05-31 DIAGNOSIS — I10 Essential (primary) hypertension: Secondary | ICD-10-CM

## 2023-05-31 DIAGNOSIS — I2510 Atherosclerotic heart disease of native coronary artery without angina pectoris: Secondary | ICD-10-CM | POA: Insufficient documentation

## 2023-05-31 DIAGNOSIS — Z720 Tobacco use: Secondary | ICD-10-CM

## 2023-05-31 DIAGNOSIS — R000 Tachycardia, unspecified: Secondary | ICD-10-CM

## 2023-05-31 DIAGNOSIS — E782 Mixed hyperlipidemia: Secondary | ICD-10-CM | POA: Insufficient documentation

## 2023-05-31 MED ORDER — METOPROLOL SUCCINATE ER 50 MG PO TB24: 50.0000 mg | ORAL_TABLET | Freq: Every day | ORAL | 11 refills | Status: DC

## 2023-05-31 MED ORDER — ASPIRIN 81 MG PO TBEC: 81.0000 mg | DELAYED_RELEASE_TABLET | Freq: Every day | ORAL | 1 refills | Status: AC

## 2023-05-31 NOTE — Progress Notes (Signed)
Cardiology Office Note   Date:  05/31/2023   ID:  [name], DOB 05/25/1961, MRN [id number]  PCP:  Sherrie Mustache, MD  Cardiologist:  Marisue Ivan, NP      History of Present Illness: Michelle Walker is a 62 y.o. female who presents for  Chief Complaint  Patient presents with   Follow-up    CCTA results    Patient in office to discuss results of CCTA. Denies chest pain, shortness of breath.      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 Wide excision/ RT, T1a,N0. ER/PR pos, Her 2.neg. Tamoxifen x 6 months, d/c secondary to vasomotor sympotms.    Flu 10/08/2022   Headache    HTN (hypertension) 09/20/2017   Hypertension    Hypokalemia 10/10/2022   Hypoxia 02/27/2022   Last Assessment & Plan: Formatting of this note might be different from the original. 62 year old female with history of hypertension, gout, breast cancer left mastectomy on oral hormonal therapy, smoker, alcohol user on daily basis came to the emergency room after passing out.  Fell off from the bar stool. Loss of consciousness is unknown.  Patient found to be hypoxic in the ER.  On room air sats   Migraine 09/20/2017   Multifocal pneumonia 10/08/2022   Pain in joint of right hip 09/10/2021   Peripheral neuropathy    Personal history of radiation therapy 2016   RIGHT lumpectomy   Post-operative state 04/12/2022   Respiratory failure (HCC) 10/06/2022   Thyroid disease      Past Surgical History:  Procedure Laterality Date   ANTERIOR CRUCIATE LIGAMENT REPAIR Right    BREAST BIOPSY Right 07/17/2015   INVASIVE LOBULAR CARCINOMA, CLASSIC TYPE (1.5 MM), ARISING IN A    BREAST BIOPSY Left 09/19/2020   [place] bx 3 areas/ 1oc q clip/ 12 oc vision clip, axilla lymph node hydromark shape 3/ path pending   BREAST LUMPECTOMY Right 08/22/2015   BREAST LUMPECTOMY WITH SENTINEL LYMPH NODE BIOPSY Right  08/22/2015   Procedure: BREAST LUMPECTOMY WITH SENTINEL LYMPH NODE BX;  Surgeon: Kieth Brightly, MD;  Location: [facility];  Service: General;  Laterality: Right;   COLONOSCOPY W/ POLYPECTOMY  2014   COLONOSCOPY WITH PROPOFOL N/A 04/16/2019   Procedure: COLONOSCOPY WITH BIOPSIES;  Surgeon: Midge Minium, MD;  Location: [facility];  Service: Endoscopy;  Laterality: N/A;   DILATION AND CURETTAGE OF UTERUS     20 years ago   LAPAROSCOPIC VAGINAL HYSTERECTOMY WITH SALPINGO OOPHORECTOMY Bilateral 04/12/2022   Procedure: LAPAROSCOPIC ASSISTED VAGINAL HYSTERECTOMY WITH SALPINGO OOPHORECTOMY;  Surgeon: Linzie Collin, MD;  Location: [facility];  Service: Gynecology;  Laterality: Bilateral;   MASTECTOMY Left    2021   NOVASURE ABLATION     POLYPECTOMY N/A 04/16/2019   Procedure: POLYPECTOMY;  Surgeon: Midge Minium, MD;  Location: [facility];  Service: Endoscopy;  Laterality: N/A;   PORTACATH PLACEMENT Right 12/01/2020   Procedure: INSERTION PORT-A-CATH;  Surgeon: Campbell Lerner, MD;  Location: [facility];  Service: General;  Laterality: Right;   TOTAL MASTECTOMY Left 05/06/2021   Procedure: modified radical mastectomy;  Surgeon: Campbell Lerner, MD;  Location: [facility];  Service: General;  Laterality: Left;  Provider requesting 1.5 hours / 90 minutes for procedure     Current Outpatient Medications  Medication Sig Dispense Refill   albuterol (VENTOLIN HFA) 108 (  90 Base) MCG/ACT inhaler Inhale 2 puffs into the lungs every 6 (six) hours as needed for wheezing or shortness of breath. 8 g 2   allopurinol (ZYLOPRIM) 100 MG tablet Take 100 mg by mouth at bedtime.     aspirin EC 81 MG tablet Take 1 tablet (81 mg total) by mouth daily. Swallow whole. 180 tablet 1   atorvastatin (LIPITOR) 10 MG tablet Take 10 mg by mouth daily.     Calcium Carb-Cholecalciferol (CALCIUM 600+D3 PO) Take 1 tablet by mouth at bedtime.     DULoxetine (CYMBALTA) 60 MG capsule TAKE 1 CAPSULE(60 MG) BY  MOUTH DAILY 30 capsule 1   letrozole (FEMARA) 2.5 MG tablet TAKE 1 TABLET(2.5 MG) BY MOUTH DAILY. START AFTER RADIATION IS OVER 90 tablet 1   losartan (COZAAR) 100 MG tablet Take 1 tablet by mouth daily.     metoprolol succinate (TOPROL XL) 100 MG 24 hr tablet Take 1 tablet (100 mg total) by mouth daily. Take with or immediately following a meal. 30 tablet 11   metoprolol succinate (TOPROL XL) 50 MG 24 hr tablet Take 1 tablet (50 mg total) by mouth daily. Take with or immediately following a meal. 30 tablet 11   pregabalin (LYRICA) 75 MG capsule TAKE 1 CAPSULE(75 MG) BY MOUTH TWICE DAILY 60 capsule 3   spironolactone (ALDACTONE) 25 MG tablet Take 1 tablet (25 mg total) by mouth daily. 30 tablet 11   No current facility-administered medications for this visit.    Allergies:   Triamcinolone and Tape    Social History:   reports that she has been smoking cigarettes. She has a 30 pack-year smoking history. She has never used smokeless tobacco. She reports current alcohol use of about 2.0 standard drinks of alcohol per week. She reports that she does not use drugs.   Family History:  family history includes Cancer in her sister; Diabetes in her brother and sister; Stroke in her mother and sister.    ROS:     Review of Systems  Constitutional: Negative.   HENT: Negative.    Eyes: Negative.   Respiratory: Negative.    Cardiovascular: Negative.   Gastrointestinal: Negative.   Genitourinary: Negative.   Musculoskeletal: Negative.   Skin: Negative.   Neurological: Negative.   Endo/Heme/Allergies: Negative.   Psychiatric/Behavioral: Negative.    All other systems reviewed and are negative.     All other systems are reviewed and negative.    PHYSICAL EXAM: VS:  BP (!) 130/90   Pulse (!) 115   Ht 5\' 6"  (1.676 m)   Wt 191 lb 3.2 oz (86.7 kg)   LMP 01/05/2017   SpO2 99%   BMI 30.86 kg/m  , BMI Body mass index is 30.86 kg/m. Last weight:  Wt Readings from Last 3 Encounters:   05/31/23 191 lb 3.2 oz (86.7 kg)  05/09/23 192 lb (87.1 kg)  04/04/23 189 lb 7.8 oz (86 kg)     Physical Exam Vitals and nursing note reviewed.  Constitutional:      Appearance: Normal appearance. She is normal weight.  HENT:     Head: Normocephalic and atraumatic.     Nose: Nose normal.     Mouth/Throat:     Mouth: Mucous membranes are moist.     Pharynx: Oropharynx is clear.  Eyes:     Conjunctiva/sclera: Conjunctivae normal.     Pupils: Pupils are equal, round, and reactive to light.  Cardiovascular:     Rate and Rhythm: Normal rate and  regular rhythm.     Pulses: Normal pulses.     Heart sounds: Normal heart sounds.  Pulmonary:     Effort: Pulmonary effort is normal.     Breath sounds: Normal breath sounds.  Abdominal:     General: Abdomen is flat. Bowel sounds are normal.     Palpations: Abdomen is soft.  Musculoskeletal:        General: Normal range of motion.     Cervical back: Normal range of motion.  Skin:    General: Skin is warm and dry.  Neurological:     General: No focal deficit present.     Mental Status: She is alert and oriented to person, place, and time. Mental status is at baseline.  Psychiatric:        Mood and Affect: Mood normal.        Behavior: Behavior normal.      EKG: none today  Recent Labs: 10/06/2022: B Natriuretic Peptide 549.8 10/08/2022: Magnesium 2.7 10/09/2022: Hemoglobin 12.3; Platelets 199 04/04/2023: ALT 14 05/10/2023: BUN 13; Creatinine, Ser 0.73; Potassium 4.7; Sodium 140    Lipid Panel    Component Value Date/Time   CHOL 161 01/29/2021 0827   TRIG 119 01/29/2021 0827   HDL 42 01/29/2021 0827   CHOLHDL 3.8 01/29/2021 0827   VLDL 24 01/29/2021 0827   LDLCALC 95 01/29/2021 0827      Other studies Reviewed: CCTA 05/2023   ASSESSMENT AND PLAN:    ICD-10-CM   1. Coronary artery disease involving native coronary artery of native heart without angina pectoris  I25.10     2. Primary hypertension  I10     3.  Tachycardia  R00.0     4. Tobacco use  Z72.0     5. Mixed hyperlipidemia  E78.2        Problem List Items Addressed This Visit       Cardiovascular and Mediastinum   Primary hypertension (Chronic)   Relevant Medications   aspirin EC 81 MG tablet   metoprolol succinate (TOPROL XL) 50 MG 24 hr tablet   Coronary artery disease involving native coronary artery of native heart without angina pectoris - Primary    05/2023 1. Calcium score: 635.8 2. Right dominant system 3. LAD and LCX have minor luminor irregularities. Distal RCA has 60% disease. Treat medically.  Denies chest pain. Will add aspirin 81 mg daily.       Relevant Medications   aspirin EC 81 MG tablet   metoprolol succinate (TOPROL XL) 50 MG 24 hr tablet     Other   Tobacco use (Chronic)    Smoking cessation instruction/counseling given:  counseled patient on the dangers of tobacco use, advised patient to stop smoking, and reviewed strategies to maximize success       Tachycardia    Heart rate elevated on metoprolol 100 mg daily. Patient states she is compliant. Will add an extra metoprolol 50 mg daily.       Mixed hyperlipidemia    Patient on atovastatin 10 mg daily. Goal for LDL less than 70. Patient having cholesterol checked in August at [facility] office.       Relevant Medications   aspirin EC 81 MG tablet   metoprolol succinate (TOPROL XL) 50 MG 24 hr tablet     Disposition:   Return in about 4 weeks (around 06/28/2023).    Total time spent: 30 minutes  Signed,  Marisue Ivan, NP  05/31/2023 4:30 PM    [facility]

## 2023-05-31 NOTE — Assessment & Plan Note (Signed)
05/2023 1. Calcium score: 635.8 2. Right dominant system 3. LAD and LCX have minor luminor irregularities. Distal RCA has 60% disease. Treat medically.  Denies chest pain. Will add aspirin 81 mg daily.

## 2023-05-31 NOTE — Assessment & Plan Note (Signed)
Patient on atovastatin 10 mg daily. Goal for LDL less than 70. Patient having cholesterol checked in August at [facility] office.

## 2023-05-31 NOTE — Assessment & Plan Note (Signed)
Smoking cessation instruction/counseling given:  counseled patient on the dangers of tobacco use, advised patient to stop smoking, and reviewed strategies to maximize success 

## 2023-05-31 NOTE — Assessment & Plan Note (Signed)
Heart rate elevated on metoprolol 100 mg daily. Patient states she is compliant. Will add an extra metoprolol 50 mg daily.

## 2023-06-08 ENCOUNTER — Other Ambulatory Visit: Payer: Self-pay | Admitting: Sports Medicine

## 2023-06-08 DIAGNOSIS — R2231 Localized swelling, mass and lump, right upper limb: Secondary | ICD-10-CM

## 2023-06-21 ENCOUNTER — Other Ambulatory Visit: Payer: 59

## 2023-06-21 ENCOUNTER — Ambulatory Visit: Payer: 59

## 2023-06-21 ENCOUNTER — Ambulatory Visit: Payer: 59 | Admitting: Oncology

## 2023-06-28 ENCOUNTER — Other Ambulatory Visit: Payer: 59

## 2023-06-30 ENCOUNTER — Encounter: Payer: Self-pay | Admitting: Cardiovascular Disease

## 2023-06-30 ENCOUNTER — Ambulatory Visit (INDEPENDENT_AMBULATORY_CARE_PROVIDER_SITE_OTHER): Payer: 59 | Admitting: Cardiovascular Disease

## 2023-06-30 VITALS — BP 133/79 | HR 115 | Ht 66.0 in | Wt 191.4 lb

## 2023-06-30 DIAGNOSIS — Z789 Other specified health status: Secondary | ICD-10-CM

## 2023-06-30 DIAGNOSIS — I4711 Inappropriate sinus tachycardia, so stated: Secondary | ICD-10-CM

## 2023-06-30 DIAGNOSIS — I2510 Atherosclerotic heart disease of native coronary artery without angina pectoris: Secondary | ICD-10-CM

## 2023-06-30 DIAGNOSIS — I10 Essential (primary) hypertension: Secondary | ICD-10-CM

## 2023-06-30 DIAGNOSIS — Z720 Tobacco use: Secondary | ICD-10-CM

## 2023-06-30 DIAGNOSIS — R55 Syncope and collapse: Secondary | ICD-10-CM

## 2023-06-30 MED ORDER — METOPROLOL SUCCINATE ER 200 MG PO TB24: 200.0000 mg | ORAL_TABLET | Freq: Every day | ORAL | 11 refills | Status: DC

## 2023-06-30 NOTE — Addendum Note (Signed)
Addended by: Adrian Blackwater A on: 06/30/2023 02:18 PM   Modules accepted: Level of Service

## 2023-06-30 NOTE — Progress Notes (Addendum)
Cardiology Office Note   Date:  06/30/2023   ID:  Michelle Walker, DOB [birth year], MRN [id number]  PCP:  Sherrie Mustache, MD  Cardiologist:  Adrian Blackwater, MD      History of Present Illness: Michelle Walker is a 62 y.o. 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 Wide excision/ RT, T1a,N0. ER/PR pos, Her 2.neg. Tamoxifen x 6 months, d/c secondary to vasomotor sympotms.    Flu 10/08/2022   Headache    HTN (hypertension) 09/20/2017   Hypertension    Hypokalemia 10/10/2022   Hypoxia 02/27/2022   Last Assessment & Plan: Formatting of this note might be different from the original. 62 year old female with history of hypertension, gout, breast cancer left mastectomy on oral hormonal therapy, smoker, alcohol user on daily basis came to the emergency room after passing out.  Fell off from the bar stool. Loss of consciousness is unknown.  Patient found to be hypoxic in the ER.  On room air sats   Migraine 09/20/2017   Multifocal pneumonia 10/08/2022   Pain in joint of right hip 09/10/2021   Peripheral neuropathy    Personal history of radiation therapy 2016   RIGHT lumpectomy   Post-operative state 04/12/2022   Respiratory failure (HCC) 10/06/2022   Thyroid disease      Past Surgical History:  Procedure Laterality Date   ANTERIOR CRUCIATE LIGAMENT REPAIR Right    BREAST BIOPSY Right 07/17/2015   INVASIVE LOBULAR CARCINOMA, CLASSIC TYPE (1.5 MM), ARISING IN A    BREAST BIOPSY Left 09/19/2020   [place] bx 3 areas/ 1oc q clip/ 12 oc vision clip, axilla lymph node hydromark shape 3/ path pending   BREAST LUMPECTOMY Right 08/22/2015   BREAST LUMPECTOMY WITH SENTINEL LYMPH NODE BIOPSY Right 08/22/2015   Procedure: BREAST LUMPECTOMY WITH SENTINEL LYMPH  NODE BX;  Surgeon: Kieth Brightly, MD;  Location: [facility];  Service: General;  Laterality: Right;   COLONOSCOPY W/ POLYPECTOMY  2014   COLONOSCOPY WITH PROPOFOL N/A 04/16/2019   Procedure: COLONOSCOPY WITH BIOPSIES;  Surgeon: Midge Minium, MD;  Location: [facility];  Service: Endoscopy;  Laterality: N/A;   DILATION AND CURETTAGE OF UTERUS     20 years ago   LAPAROSCOPIC VAGINAL HYSTERECTOMY WITH SALPINGO OOPHORECTOMY Bilateral 04/12/2022   Procedure: LAPAROSCOPIC ASSISTED VAGINAL HYSTERECTOMY WITH SALPINGO OOPHORECTOMY;  Surgeon: Linzie Collin, MD;  Location: [facility];  Service: Gynecology;  Laterality: Bilateral;   MASTECTOMY Left    2021   NOVASURE ABLATION     POLYPECTOMY N/A 04/16/2019   Procedure: POLYPECTOMY;  Surgeon: Midge Minium, MD;  Location: [facility];  Service: Endoscopy;  Laterality: N/A;   PORTACATH PLACEMENT Right 12/01/2020   Procedure: INSERTION PORT-A-CATH;  Surgeon: Campbell Lerner, MD;  Location: [facility];  Service: General;  Laterality: Right;   TOTAL MASTECTOMY Left 05/06/2021   Procedure: modified radical mastectomy;  Surgeon: Campbell Lerner, MD;  Location: [facility];  Service: General;  Laterality: Left;  Provider requesting 1.5 hours / 90 minutes for procedure     Current Outpatient Medications  Medication Sig Dispense Refill   albuterol (VENTOLIN HFA) 108 (90 Base) MCG/ACT inhaler Inhale 2 puffs into the lungs every 6 (  six) hours as needed for wheezing or shortness of breath. 8 g 2   allopurinol (ZYLOPRIM) 100 MG tablet Take 100 mg by mouth at bedtime.     aspirin EC 81 MG tablet Take 1 tablet (81 mg total) by mouth daily. Swallow whole. 180 tablet 1   atorvastatin (LIPITOR) 10 MG tablet Take 10 mg by mouth daily.     Calcium Carb-Cholecalciferol (CALCIUM 600+D3 PO) Take 1 tablet by mouth at bedtime.     DULoxetine (CYMBALTA) 60 MG capsule TAKE 1 CAPSULE(60 MG) BY MOUTH DAILY 30 capsule 1   letrozole (FEMARA) 2.5 MG tablet TAKE 1  TABLET(2.5 MG) BY MOUTH DAILY. START AFTER RADIATION IS OVER 90 tablet 1   losartan (COZAAR) 100 MG tablet Take 1 tablet by mouth daily.     metoprolol (TOPROL XL) 200 MG 24 hr tablet Take 1 tablet (200 mg total) by mouth daily. 30 tablet 11   pregabalin (LYRICA) 75 MG capsule TAKE 1 CAPSULE(75 MG) BY MOUTH TWICE DAILY 60 capsule 3   spironolactone (ALDACTONE) 25 MG tablet Take 1 tablet (25 mg total) by mouth daily. 30 tablet 11   No current facility-administered medications for this visit.    Allergies:   Triamcinolone and Tape    Social History:   reports that she has been smoking cigarettes. She has a 30 pack-year smoking history. She has never used smokeless tobacco. She reports current alcohol use of about 2.0 standard drinks of alcohol per week. She reports that she does not use drugs.   Family History:  family history includes Cancer in her sister; Diabetes in her brother and sister; Stroke in her mother and sister.    ROS:     Review of Systems  Constitutional: Negative.   HENT: Negative.    Eyes: Negative.   Respiratory: Negative.    Gastrointestinal: Negative.   Genitourinary: Negative.   Musculoskeletal: Negative.   Skin: Negative.   Neurological: Negative.   Endo/Heme/Allergies: Negative.   Psychiatric/Behavioral: Negative.    All other systems reviewed and are negative.     All other systems are reviewed and negative.    PHYSICAL EXAM: VS:  BP 133/79   Pulse (!) 115   Ht 5\' 6"  (1.676 m)   Wt 191 lb 6.4 oz (86.8 kg)   LMP 01/05/2017   SpO2 98%   BMI 30.89 kg/m  , BMI Body mass index is 30.89 kg/m. Last weight:  Wt Readings from Last 3 Encounters:  06/30/23 191 lb 6.4 oz (86.8 kg)  05/31/23 191 lb 3.2 oz (86.7 kg)  05/09/23 192 lb (87.1 kg)     Physical Exam Constitutional:      Appearance: Normal appearance.  Cardiovascular:     Rate and Rhythm: Normal rate and regular rhythm.     Heart sounds: Normal heart sounds.  Pulmonary:     Effort:  Pulmonary effort is normal.     Breath sounds: Normal breath sounds.  Musculoskeletal:     Right lower leg: No edema.     Left lower leg: No edema.  Neurological:     Mental Status: She is alert.       EKG: sinus tachycardia  108/min old IWMI  Recent Labs: 10/06/2022: B Natriuretic Peptide 549.8 10/08/2022: Magnesium 2.7 10/09/2022: Hemoglobin 12.3; Platelets 199 04/04/2023: ALT 14 05/10/2023: BUN 13; Creatinine, Ser 0.73; Potassium 4.7; Sodium 140    Lipid Panel    Component Value Date/Time   CHOL 161 01/29/2021 0827   TRIG 119 01/29/2021  0827   HDL 42 01/29/2021 0827   CHOLHDL 3.8 01/29/2021 0827   VLDL 24 01/29/2021 0827   LDLCALC 95 01/29/2021 0827      Other studies Reviewed: Additional studies/ records that were reviewed today include:  Review of the above records demonstrates:       No data to display            ASSESSMENT AND PLAN:    ICD-10-CM   1. Coronary artery disease involving native coronary artery of native heart without angina pectoris  I25.10 metoprolol (TOPROL XL) 200 MG 24 hr tablet   Has 60% distal RCA, withca score 600. But no chest pains    2. Primary hypertension  I10 metoprolol (TOPROL XL) 200 MG 24 hr tablet    3. Alcohol use  Z78.9 metoprolol (TOPROL XL) 200 MG 24 hr tablet   quit drinkin    4. Tobacco use  Z72.0 metoprolol (TOPROL XL) 200 MG 24 hr tablet    5. Vasovagal syncope  R55 metoprolol (TOPROL XL) 200 MG 24 hr tablet    6. 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    Still has sinus tachycardia, change 150 to 200 metoprolol daily       Problem List Items Addressed This Visit       Cardiovascular and Mediastinum   Primary hypertension (Chronic)   Relevant Medications   metoprolol (TOPROL XL) 200 MG 24 hr tablet   Coronary artery disease involving native coronary artery of native heart without angina pectoris - Primary   Relevant Medications   metoprolol (TOPROL XL) 200 MG 24 hr tablet     Other   Alcohol use  (Chronic)   Relevant Medications   metoprolol (TOPROL XL) 200 MG 24 hr tablet   Tobacco use (Chronic)   Relevant Medications   metoprolol (TOPROL XL) 200 MG 24 hr tablet   Vasovagal syncope   Relevant Medications   metoprolol (TOPROL XL) 200 MG 24 hr tablet   Other Visit Diagnoses     Inappropriate sinus tachycardia       Still has sinus tachycardia, change 150 to 200 metoprolol daily   Relevant Medications   metoprolol (TOPROL XL) 200 MG 24 hr tablet          Disposition:   Return in about 4 weeks (around 07/28/2023).    Total time spent: 35 minutes  Signed,  Adrian Blackwater, MD  06/30/2023 1:49 PM    [facility]

## 2023-07-01 ENCOUNTER — Encounter: Payer: Self-pay | Admitting: Cardiovascular Disease

## 2023-07-05 ENCOUNTER — Encounter: Payer: Self-pay | Admitting: Oncology

## 2023-07-05 ENCOUNTER — Inpatient Hospital Stay (HOSPITAL_BASED_OUTPATIENT_CLINIC_OR_DEPARTMENT_OTHER): Payer: 59 | Admitting: Oncology

## 2023-07-05 ENCOUNTER — Inpatient Hospital Stay: Payer: 59 | Attending: Oncology

## 2023-07-05 ENCOUNTER — Inpatient Hospital Stay: Payer: 59

## 2023-07-05 VITALS — BP 135/94 | HR 97 | Temp 96.6°F | Resp 18 | Ht 66.0 in | Wt 193.4 lb

## 2023-07-05 DIAGNOSIS — C50911 Malignant neoplasm of unspecified site of right female breast: Secondary | ICD-10-CM | POA: Insufficient documentation

## 2023-07-05 DIAGNOSIS — G620 Drug-induced polyneuropathy: Secondary | ICD-10-CM | POA: Diagnosis not present

## 2023-07-05 DIAGNOSIS — C773 Secondary and unspecified malignant neoplasm of axilla and upper limb lymph nodes: Secondary | ICD-10-CM | POA: Insufficient documentation

## 2023-07-05 DIAGNOSIS — Z08 Encounter for follow-up examination after completed treatment for malignant neoplasm: Secondary | ICD-10-CM

## 2023-07-05 DIAGNOSIS — Z923 Personal history of irradiation: Secondary | ICD-10-CM | POA: Insufficient documentation

## 2023-07-05 DIAGNOSIS — Z170 Estrogen receptor positive status [ER+]: Secondary | ICD-10-CM

## 2023-07-05 DIAGNOSIS — T451X5A Adverse effect of antineoplastic and immunosuppressive drugs, initial encounter: Secondary | ICD-10-CM

## 2023-07-05 DIAGNOSIS — Z90710 Acquired absence of both cervix and uterus: Secondary | ICD-10-CM | POA: Diagnosis not present

## 2023-07-05 DIAGNOSIS — Z79811 Long term (current) use of aromatase inhibitors: Secondary | ICD-10-CM | POA: Diagnosis not present

## 2023-07-05 DIAGNOSIS — F17210 Nicotine dependence, cigarettes, uncomplicated: Secondary | ICD-10-CM | POA: Diagnosis not present

## 2023-07-05 DIAGNOSIS — Z853 Personal history of malignant neoplasm of breast: Secondary | ICD-10-CM | POA: Diagnosis not present

## 2023-07-05 DIAGNOSIS — Z9012 Acquired absence of left breast and nipple: Secondary | ICD-10-CM | POA: Insufficient documentation

## 2023-07-05 DIAGNOSIS — Z90722 Acquired absence of ovaries, bilateral: Secondary | ICD-10-CM | POA: Insufficient documentation

## 2023-07-05 DIAGNOSIS — Z9221 Personal history of antineoplastic chemotherapy: Secondary | ICD-10-CM | POA: Diagnosis not present

## 2023-07-05 LAB — COMPREHENSIVE METABOLIC PANEL
ALT: 19 U/L (ref 0–44)
AST: 27 U/L (ref 15–41)
Albumin: 4.2 g/dL (ref 3.5–5.0)
Alkaline Phosphatase: 67 U/L (ref 38–126)
Anion gap: 10 (ref 5–15)
BUN: 15 mg/dL (ref 6–20)
CO2: 23 mmol/L (ref 22–32)
Calcium: 10.1 mg/dL (ref 8.9–10.3)
Chloride: 102 mmol/L (ref 98–111)
Creatinine, Ser: 0.72 mg/dL (ref 0.44–1.00)
GFR, Estimated: >60 mL/min (ref >60)
Glucose, Bld: 86 mg/dL (ref 70–99)
Potassium: 4 mmol/L (ref 3.5–5.1)
Sodium: 135 mmol/L (ref 135–145)
Total Bilirubin: 0.4 mg/dL (ref 0.3–1.2)
Total Protein: 7.9 g/dL (ref 6.5–8.1)

## 2023-07-05 MED ORDER — ZOLEDRONIC ACID 4 MG/100ML IV SOLN: 4.0000 mg | INTRAVENOUS | Status: DC

## 2023-07-05 MED ORDER — SODIUM CHLORIDE 0.9 % IV SOLN
INTRAVENOUS | Status: DC
  Filled 2023-07-05: qty 250

## 2023-07-05 NOTE — Progress Notes (Signed)
After MD visit, Pt in chair and IV access obtained. Upon releaseing Zometa- noted that just had in June- Per Dr Smith Robert- pt does not need at this time. IV site pulled and pt sent home.

## 2023-07-09 ENCOUNTER — Encounter: Payer: Self-pay | Admitting: Oncology

## 2023-07-09 NOTE — Progress Notes (Signed)
Hematology/Oncology Consult note [facility]  [phone] [phone]  Patient Care Team: Sherrie Mustache, MD as PCP - General (Internal Medicine) Sherrie Mustache, MD as Consulting Physician (Internal Medicine) Kieth Brightly, MD (General Surgery) Elinor Parkinson, [place] as Consulting Physician (Podiatry) Creig Hines, MD as Consulting Physician (Hematology and Oncology) Campbell Lerner, MD as Consulting Physician (General Surgery) Carmina Miller, MD as Consulting Physician (Radiation Oncology)   Name of the patient: Michelle Walker  [id number]  July 20, 1961   Date of visit: 07/09/23  Diagnosis-  recurrent left breast cancer stage II [id number] ER/PR positive HER-2 negative    Chief complaint/ Reason for visit- routine f/u breast cancer  Heme/Onc history:  Patient is a 62 year old female who was diagnosed with 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  She did not require adjuvant chemotherapy.  She was initially on tamoxifen which she could not tolerate and was switched to letrozole.  Last seen by me in June 2019 at which point she had a menstrual cycle and therefore not given another AI.  She had subsequently did not follow-up with me and remained off hormone therapy.     More recently patient underwent a diagnostic bilateral mammogram which showed a 2.2 x 1.9 x 1.9 cm mass at the 12 o'clock position of the left breast 1 cm from the nipple.  She was also found to have 5 other smaller breast masses ranging from 0.3 to 0.8 cm.  Noted to have a solitary left axillary lymph node with cortical thickening.  She had a biopsy of the dominant left breast mass as well as another smaller breast mass and the left axillary lymph node all of which were positive for metastatic invasive mammary carcinoma grade 2.  Tumor was ER 91 200% positive PR 71 to 80% positive and HER-2 IHC  equivocal but negative by FISH   MRI of the bilateral breasts showed no abnormal findings in the right breast.  At least 6 discrete hypoechoic masses in the left breast with the largest one measuring 2.2 cm with 1 abnormal left axillary lymph node.  The size of the other breast masses ranging from 4mm to 1.4 cm.  The total area of masses and non-mass enhancement was about 10 x 5 x 6.5 cm.  CT chest abdomen and pelvis with contrast showed no evidence of distant metastatic disease.  Bone scan was also negative.   MammaPrint done on the biopsy specimen came back as high risk with greater than 12% chemotherapy benefit.  Plan is for neoadjuvant dose dense AC-Taxol chemotherapy followed by surgery.  Start of chemotherapy was delayed since patient tested positive for Covid   Patient completed neoadjuvant ddAC- taxol chemo between feb-June 2022.    Patient underwent left mastectomy with axillary lymph node dissection.  Final pathology showed 12 mm multifocal invasive mammary carcinoma at least 3 of them in number overall grade 2 with negative margins.  Metastatic carcinoma involving 3 out of 12 lymph nodes.  No extranodal extension identified.  Largest site of metastatic deposit 3 mm.  pT1c pN1a   Patient completed adjuvant radiation therapy in October 2022.  patient completed adjuvant zometa as well    Interval history-symptoms of peripheral neuropathy-year-stable.  She is tolerating letrozole well without any significant side effects.  Denies any breast concerns  ECOG PS- 1 Pain scale- 3 Opioid associated constipation- no  Review of systems- Review of Systems  Constitutional:  Positive for malaise/fatigue. Negative for chills, fever and weight loss.  HENT:  Negative for congestion, ear discharge and nosebleeds.   Eyes:  Negative for blurred vision.  Respiratory:  Negative for cough, hemoptysis, sputum production, shortness of breath and wheezing.   Cardiovascular:  Negative for chest pain,  palpitations, orthopnea and claudication.  Gastrointestinal:  Negative for abdominal pain, blood in stool, constipation, diarrhea, heartburn, melena, nausea and vomiting.  Genitourinary:  Negative for dysuria, flank pain, frequency, hematuria and urgency.  Musculoskeletal:  Negative for back pain, joint pain and myalgias.  Skin:  Negative for rash.  Neurological:  Positive for sensory change (peripheral neuropathy). Negative for dizziness, tingling, focal weakness, seizures, weakness and headaches.  Endo/Heme/Allergies:  Does not bruise/bleed easily.  Psychiatric/Behavioral:  Negative for depression and suicidal ideas. The patient does not have insomnia.       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 Wide excision/ RT, T1a,N0. ER/PR pos, Her 2.neg. Tamoxifen x 6 months, d/c secondary to vasomotor sympotms.    Flu 10/08/2022   Headache    HTN (hypertension) 09/20/2017   Hypertension    Hypokalemia 10/10/2022   Hypoxia 02/27/2022   Last Assessment & Plan: Formatting of this note might be different from the original. 62 year old female with history of hypertension, gout, breast cancer left mastectomy on oral hormonal therapy, smoker, alcohol user on daily basis came to the emergency room after passing out.  Fell off from the bar stool. Loss of consciousness is unknown.  Patient found to be hypoxic in the ER.  On room air sats   Migraine 09/20/2017   Multifocal pneumonia 10/08/2022   Pain in joint of right hip 09/10/2021   Peripheral neuropathy    Personal history of radiation therapy 2016   RIGHT lumpectomy   Post-operative state 04/12/2022   Respiratory failure (HCC) 10/06/2022   Thyroid disease      Past  Surgical History:  Procedure Laterality Date   ANTERIOR CRUCIATE LIGAMENT REPAIR Right    BREAST BIOPSY Right 07/17/2015   INVASIVE LOBULAR CARCINOMA, CLASSIC TYPE (1.5 MM), ARISING IN A    BREAST BIOPSY Left 09/19/2020   [place] bx 3 areas/ 1oc q clip/ 12 oc vision clip, axilla lymph node hydromark shape 3/ path pending   BREAST LUMPECTOMY Right 08/22/2015   BREAST LUMPECTOMY WITH SENTINEL LYMPH NODE BIOPSY Right 08/22/2015   Procedure: BREAST LUMPECTOMY WITH SENTINEL LYMPH NODE BX;  Surgeon: Kieth Brightly, MD;  Location: [facility];  Service: General;  Laterality: Right;   COLONOSCOPY W/ POLYPECTOMY  2014   COLONOSCOPY WITH PROPOFOL N/A 04/16/2019   Procedure: COLONOSCOPY WITH BIOPSIES;  Surgeon: Midge Minium, MD;  Location: [facility];  Service: Endoscopy;  Laterality: N/A;   DILATION AND CURETTAGE OF UTERUS     20 years ago   LAPAROSCOPIC VAGINAL HYSTERECTOMY WITH SALPINGO OOPHORECTOMY Bilateral 04/12/2022   Procedure: LAPAROSCOPIC ASSISTED VAGINAL HYSTERECTOMY WITH SALPINGO OOPHORECTOMY;  Surgeon: Linzie Collin, MD;  Location: [facility];  Service: Gynecology;  Laterality: Bilateral;   MASTECTOMY Left    2021   NOVASURE ABLATION     POLYPECTOMY N/A 04/16/2019   Procedure: POLYPECTOMY;  Surgeon: Midge Minium,  MD;  Location: [facility];  Service: Endoscopy;  Laterality: N/A;   PORTACATH PLACEMENT Right 12/01/2020   Procedure: INSERTION PORT-A-CATH;  Surgeon: Campbell Lerner, MD;  Location: [facility];  Service: General;  Laterality: Right;   TOTAL MASTECTOMY Left 05/06/2021   Procedure: modified radical mastectomy;  Surgeon: Campbell Lerner, MD;  Location: [facility];  Service: General;  Laterality: Left;  Provider requesting 1.5 hours / 90 minutes for procedure    Social History   Socioeconomic History   Marital status: Married    Spouse name: Vick   Number of children: 1   Years of education: Not on file   Highest education level: Not on file  Occupational  History   Not on file  Tobacco Use   Smoking status: Every Day    Current packs/day: 0.75    Average packs/day: 0.8 packs/day for 40.0 years (30.0 ttl pk-yrs)    Types: Cigarettes   Smokeless tobacco: Never  Vaping Use   Vaping status: Never Used  Substance and Sexual Activity   Alcohol use: Yes    Alcohol/week: 2.0 standard drinks of alcohol    Types: 2 Standard drinks or equivalent per week    Comment: BEER 1-2 QD   Drug use: No   Sexual activity: Yes    Birth control/protection: Surgical    Comment: Hysterectomy  Other Topics Concern   Not on file  Social History Narrative   Daughter and 3 kids in home, her Brother is in the home as well.   Social Determinants of Health   Financial Resource Strain: Not on file  Food Insecurity: Not on file  Transportation Needs: Not on file  Physical Activity: Not on file  Stress: Not on file  Social Connections: Not on file  Intimate Partner Violence: Not on file    Family History  Problem Relation Age of Onset   Stroke Mother    Cancer Sister        sarcoma on arm; chemo   Diabetes Sister    Stroke Sister    Diabetes Brother    Breast cancer Neg Hx    Ovarian cancer Neg Hx    Colon cancer Neg Hx    Heart disease Neg Hx      Current Outpatient Medications:    albuterol (VENTOLIN HFA) 108 (90 Base) MCG/ACT inhaler, Inhale 2 puffs into the lungs every 6 (six) hours as needed for wheezing or shortness of breath., Disp: 8 g, Rfl: 2   allopurinol (ZYLOPRIM) 100 MG tablet, Take 100 mg by mouth at bedtime., Disp: , Rfl:    aspirin EC 81 MG tablet, Take 1 tablet (81 mg total) by mouth daily. Swallow whole., Disp: 180 tablet, Rfl: 1   atorvastatin (LIPITOR) 10 MG tablet, Take 10 mg by mouth daily., Disp: , Rfl:    Calcium Carb-Cholecalciferol (CALCIUM 600+D3 PO), Take 1 tablet by mouth at bedtime., Disp: , Rfl:    DULoxetine (CYMBALTA) 60 MG capsule, TAKE 1 CAPSULE(60 MG) BY MOUTH DAILY, Disp: 30 capsule, Rfl: 1   letrozole  (FEMARA) 2.5 MG tablet, TAKE 1 TABLET(2.5 MG) BY MOUTH DAILY. START AFTER RADIATION IS OVER, Disp: 90 tablet, Rfl: 1   losartan (COZAAR) 100 MG tablet, Take 1 tablet by mouth daily., Disp: , Rfl:    metoprolol (TOPROL XL) 200 MG 24 hr tablet, Take 1 tablet (200 mg total) by mouth daily., Disp: 30 tablet, Rfl: 11   pregabalin (LYRICA) 75 MG capsule, TAKE 1 CAPSULE(75 MG) BY MOUTH TWICE  DAILY, Disp: 60 capsule, Rfl: 3   spironolactone (ALDACTONE) 25 MG tablet, Take 1 tablet (25 mg total) by mouth daily., Disp: 30 tablet, Rfl: 11  Physical exam:  Vitals:   07/05/23 1313  BP: (!) 135/94  Pulse: 97  Resp: 18  Temp: (!) 96.6 F (35.9 C)  TempSrc: Tympanic  SpO2: 99%  Weight: 193 lb 6.4 oz (87.7 kg)  Height: 5\' 6"  (1.676 m)   Physical Exam Cardiovascular:     Rate and Rhythm: Normal rate and regular rhythm.     Heart sounds: Normal heart sounds.  Pulmonary:     Effort: Pulmonary effort is normal.     Breath sounds: Normal breath sounds.  Skin:    General: Skin is warm and dry.  Neurological:     Mental Status: She is alert and oriented to person, place, and time.   Breast exam: Patient is s/p right lumpectomy with a well-healed surgical scar and no palpable right breast masses.  No palpable bilateral axillary adenopathy.  She is s/p left mastectomy without reconstruction and no evidence of chest wall recurrence.     Latest Ref Rng & Units 07/05/2023   12:39 PM  CMP  Glucose 70 - 99 mg/dL 86   BUN 6 - 20 mg/dL 15   Creatinine 4.40 - 1.00 mg/dL 3.47   Sodium 425 - 956 mmol/L 135   Potassium 3.5 - 5.1 mmol/L 4.0   Chloride 98 - 111 mmol/L 102   CO2 22 - 32 mmol/L 23   Calcium 8.9 - 10.3 mg/dL 38.7   Total Protein 6.5 - 8.1 g/dL 7.9   Total Bilirubin 0.3 - 1.2 mg/dL 0.4   Alkaline Phos 38 - 126 U/L 67   AST 15 - 41 U/L 27   ALT 0 - 44 U/L 19       Latest Ref Rng & Units 10/09/2022    5:16 AM  CBC  WBC 4.0 - 10.5 K/uL 9.5   Hemoglobin 12.0 - 15.0 g/dL 56.4   Hematocrit 33.2  - 46.0 % 38.6   Platelets 150 - 400 K/uL 199     Assessment and plan- Patient is a 62 y.o. female history of right breast cancer s/p lumpectomy now with anatomical stage IIA left breast invasive mammary carcinoma [id number] ER/PR positive and HER-2 negative.  she is s/p 4 cycles of dose dense AC and 12 weekly cycles of taxol neoadjuvant chemotherapy.  Patient underwent left mastectomy without reconstruction and final pathology showed multifocal invasive mammary carcinoma pT1cpN1a. She is here for routine f/u  Patient has completed adjuvant zometa. Clinically patient is doing well with no signs and symptoms of recurrence on todays exam. She will continue with letrozole calcium and vit D  Chemo induced peripheral neuropathy: continue lyrica  I will see her back in 6 months Visit Diagnosis 1. Encounter for follow-up surveillance of breast cancer   2. Chemotherapy-induced peripheral neuropathy (HCC)   3. Use of letrozole (Femara)      Dr. Owens Shark, MD, MPH [facility] [phone] 07/09/2023 3:26 PM

## 2023-07-10 ENCOUNTER — Other Ambulatory Visit: Payer: Self-pay | Admitting: Oncology

## 2023-07-11 ENCOUNTER — Encounter: Payer: Self-pay | Admitting: Oncology

## 2023-07-28 ENCOUNTER — Ambulatory Visit (INDEPENDENT_AMBULATORY_CARE_PROVIDER_SITE_OTHER): Payer: 59 | Admitting: Cardiovascular Disease

## 2023-07-28 ENCOUNTER — Other Ambulatory Visit: Payer: Self-pay | Admitting: Oncology

## 2023-07-28 ENCOUNTER — Encounter: Payer: Self-pay | Admitting: Cardiovascular Disease

## 2023-07-28 VITALS — BP 150/92 | HR 89 | Ht 66.0 in | Wt 192.4 lb

## 2023-07-28 DIAGNOSIS — I10 Essential (primary) hypertension: Secondary | ICD-10-CM

## 2023-07-28 DIAGNOSIS — Z720 Tobacco use: Secondary | ICD-10-CM | POA: Diagnosis not present

## 2023-07-28 DIAGNOSIS — E782 Mixed hyperlipidemia: Secondary | ICD-10-CM | POA: Diagnosis not present

## 2023-07-28 DIAGNOSIS — I2510 Atherosclerotic heart disease of native coronary artery without angina pectoris: Secondary | ICD-10-CM

## 2023-07-28 MED ORDER — LOSARTAN POTASSIUM-HCTZ 100-12.5 MG PO TABS: 1.0000 | ORAL_TABLET | Freq: Every day | ORAL | 1 refills | Status: DC

## 2023-07-28 NOTE — Progress Notes (Signed)
Cardiology Office Note   Date:  07/28/2023   ID:  Michelle Walker, DOB 1961/04/15, MRN [id number]  PCP:  Sherrie Mustache, MD  Cardiologist:  Adrian Blackwater, MD      History of Present Illness: Michelle Walker is a 62 y.o. 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 Wide excision/ RT, T1a,N0. ER/PR pos, Her 2.neg. Tamoxifen x 6 months, d/c secondary to vasomotor sympotms.    Flu 10/08/2022   Headache    HTN (hypertension) 09/20/2017   Hypertension    Hypokalemia 10/10/2022   Hypoxia 02/27/2022   Last Assessment & Plan: Formatting of this note might be different from the original. 62 year old female with history of hypertension, gout, breast cancer left mastectomy on oral hormonal therapy, smoker, alcohol user on daily basis came to the emergency room after passing out.  Fell off from the bar stool. Loss of consciousness is unknown.  Patient found to be hypoxic in the ER.  On room air sats   Migraine 09/20/2017   Multifocal pneumonia 10/08/2022   Pain in joint of right hip 09/10/2021   Peripheral neuropathy    Personal history of radiation therapy 2016   RIGHT lumpectomy   Post-operative state 04/12/2022   Respiratory failure (HCC) 10/06/2022   Thyroid disease      Past Surgical History:  Procedure Laterality Date   ANTERIOR CRUCIATE LIGAMENT REPAIR Right    BREAST BIOPSY Right 07/17/2015   INVASIVE LOBULAR CARCINOMA, CLASSIC TYPE (1.5 MM), ARISING IN A    BREAST BIOPSY Left 09/19/2020   [place] bx 3 areas/ 1oc q clip/ 12 oc vision clip, axilla lymph node hydromark shape 3/ path pending   BREAST LUMPECTOMY Right 08/22/2015   BREAST LUMPECTOMY WITH SENTINEL LYMPH NODE BIOPSY Right 08/22/2015   Procedure: BREAST LUMPECTOMY WITH SENTINEL LYMPH NODE BX;  Surgeon: Kieth Brightly,  MD;  Location: [facility];  Service: General;  Laterality: Right;   COLONOSCOPY W/ POLYPECTOMY  2014   COLONOSCOPY WITH PROPOFOL N/A 04/16/2019   Procedure: COLONOSCOPY WITH BIOPSIES;  Surgeon: Midge Minium, MD;  Location: [facility];  Service: Endoscopy;  Laterality: N/A;   DILATION AND CURETTAGE OF UTERUS     20 years ago   LAPAROSCOPIC VAGINAL HYSTERECTOMY WITH SALPINGO OOPHORECTOMY Bilateral 04/12/2022   Procedure: LAPAROSCOPIC ASSISTED VAGINAL HYSTERECTOMY WITH SALPINGO OOPHORECTOMY;  Surgeon: Linzie Collin, MD;  Location: [facility];  Service: Gynecology;  Laterality: Bilateral;   MASTECTOMY Left    2021   NOVASURE ABLATION     POLYPECTOMY N/A 04/16/2019   Procedure: POLYPECTOMY;  Surgeon: Midge Minium, MD;  Location: [facility];  Service: Endoscopy;  Laterality: N/A;   PORTACATH PLACEMENT Right 12/01/2020   Procedure: INSERTION PORT-A-CATH;  Surgeon: Campbell Lerner, MD;  Location: [facility];  Service: General;  Laterality: Right;   TOTAL MASTECTOMY Left 05/06/2021   Procedure: modified radical mastectomy;  Surgeon: Campbell Lerner, MD;  Location: [facility];  Service: General;  Laterality: Left;  Provider requesting 1.5 hours / 90 minutes for procedure     Current Outpatient Medications  Medication Sig Dispense Refill   losartan-hydrochlorothiazide (HYZAAR) 100-12.5 MG tablet Take 1 tablet by mouth daily. 90 tablet 1   albuterol (VENTOLIN HFA) 108 (90 Base) MCG/ACT inhaler  Inhale 2 puffs into the lungs every 6 (six) hours as needed for wheezing or shortness of breath. 8 g 2   allopurinol (ZYLOPRIM) 100 MG tablet Take 100 mg by mouth at bedtime.     aspirin EC 81 MG tablet Take 1 tablet (81 mg total) by mouth daily. Swallow whole. 180 tablet 1   atorvastatin (LIPITOR) 10 MG tablet Take 10 mg by mouth daily.     Calcium Carb-Cholecalciferol (CALCIUM 600+D3 PO) Take 1 tablet by mouth at bedtime.     DULoxetine (CYMBALTA) 60 MG capsule TAKE 1 CAPSULE(60 MG) BY MOUTH  DAILY 30 capsule 1   letrozole (FEMARA) 2.5 MG tablet TAKE 1 TABLET(2.5 MG) BY MOUTH DAILY. START AFTER RADIATION IS OVER 90 tablet 1   metoprolol (TOPROL XL) 200 MG 24 hr tablet Take 1 tablet (200 mg total) by mouth daily. 30 tablet 11   pregabalin (LYRICA) 75 MG capsule TAKE 1 CAPSULE(75 MG) BY MOUTH TWICE DAILY 60 capsule 3   spironolactone (ALDACTONE) 25 MG tablet Take 1 tablet (25 mg total) by mouth daily. 30 tablet 11   No current facility-administered medications for this visit.    Allergies:   Triamcinolone and Tape    Social History:   reports that she has been smoking cigarettes. She has a 30 pack-year smoking history. She has never used smokeless tobacco. She reports current alcohol use of about 2.0 standard drinks of alcohol per week. She reports that she does not use drugs.   Family History:  family history includes Cancer in her sister; Diabetes in her brother and sister; Stroke in her mother and sister.    ROS:     Review of Systems  Constitutional: Negative.   HENT: Negative.    Eyes: Negative.   Respiratory: Negative.    Gastrointestinal: Negative.   Genitourinary: Negative.   Musculoskeletal: Negative.   Skin: Negative.   Neurological: Negative.   Endo/Heme/Allergies: Negative.   Psychiatric/Behavioral: Negative.    All other systems reviewed and are negative.     All other systems are reviewed and negative.    PHYSICAL EXAM: VS:  BP (!) 150/92   Pulse 89   Ht 5\' 6"  (1.676 m)   Wt 192 lb 6.4 oz (87.3 kg)   LMP 01/05/2017   SpO2 99%   BMI 31.05 kg/m  , BMI Body mass index is 31.05 kg/m. Last weight:  Wt Readings from Last 3 Encounters:  07/28/23 192 lb 6.4 oz (87.3 kg)  07/05/23 193 lb 6.4 oz (87.7 kg)  06/30/23 191 lb 6.4 oz (86.8 kg)     Physical Exam Constitutional:      Appearance: Normal appearance.  Cardiovascular:     Rate and Rhythm: Normal rate and regular rhythm.     Heart sounds: Normal heart sounds.  Pulmonary:     Effort:  Pulmonary effort is normal.     Breath sounds: Normal breath sounds.  Musculoskeletal:     Right lower leg: No edema.     Left lower leg: No edema.  Neurological:     Mental Status: She is alert.       EKG:   Recent Labs: 10/06/2022: B Natriuretic Peptide 549.8 10/08/2022: Magnesium 2.7 10/09/2022: Hemoglobin 12.3; Platelets 199 07/05/2023: ALT 19; BUN 15; Creatinine, Ser 0.72; Potassium 4.0; Sodium 135    Lipid Panel    Component Value Date/Time   CHOL 161 01/29/2021 0827   TRIG 119 01/29/2021 0827   HDL 42 01/29/2021 0827   CHOLHDL 3.8  01/29/2021 0827   VLDL 24 01/29/2021 0827   LDLCALC 95 01/29/2021 0827      Other studies Reviewed: Additional studies/ records that were reviewed today include:  Review of the above records demonstrates:       No data to display            ASSESSMENT AND PLAN:    ICD-10-CM   1. Coronary artery disease involving native coronary artery of native heart without angina pectoris  I25.10    Ca score 700, distal RCA 60%    2. Primary hypertension  I10    Add HCTZ to Losartan100/12.5 as repeat 140/90    3. Tobacco use  Z72.0     4. Mixed hyperlipidemia  E78.2        Problem List Items Addressed This Visit       Cardiovascular and Mediastinum   Primary hypertension (Chronic)   Relevant Medications   losartan-hydrochlorothiazide (HYZAAR) 100-12.5 MG tablet   Coronary artery disease involving native coronary artery of native heart without angina pectoris - Primary   Relevant Medications   losartan-hydrochlorothiazide (HYZAAR) 100-12.5 MG tablet     Other   Tobacco use (Chronic)   Mixed hyperlipidemia   Relevant Medications   losartan-hydrochlorothiazide (HYZAAR) 100-12.5 MG tablet       Disposition:   Return in about 6 weeks (around 09/08/2023).    Total time spent: 30 minutes  Signed,  Adrian Blackwater, MD  07/28/2023 3:45 PM    [facility]

## 2023-08-01 ENCOUNTER — Ambulatory Visit
Admission: RE | Admit: 2023-08-01 | Discharge: 2023-08-01 | Disposition: A | Discharge status: Home / Self Care | Payer: 59 | Source: Ambulatory Visit | Attending: Oncology | Admitting: Oncology

## 2023-08-01 DIAGNOSIS — C50012 Malignant neoplasm of nipple and areola, left female breast: Secondary | ICD-10-CM | POA: Diagnosis present

## 2023-08-01 DIAGNOSIS — Z79811 Long term (current) use of aromatase inhibitors: Secondary | ICD-10-CM | POA: Insufficient documentation

## 2023-08-01 DIAGNOSIS — Z170 Estrogen receptor positive status [ER+]: Secondary | ICD-10-CM | POA: Diagnosis present

## 2023-08-17 ENCOUNTER — Ambulatory Visit
Admission: RE | Admit: 2023-08-17 | Discharge: 2023-08-17 | Disposition: A | Discharge status: Home / Self Care | Payer: 59 | Source: Ambulatory Visit | Attending: Radiation Oncology | Admitting: Radiation Oncology

## 2023-08-17 ENCOUNTER — Telehealth: Payer: Self-pay | Admitting: *Deleted

## 2023-08-17 ENCOUNTER — Encounter: Payer: Self-pay | Admitting: *Deleted

## 2023-08-17 ENCOUNTER — Encounter: Payer: Self-pay | Admitting: Radiation Oncology

## 2023-08-17 VITALS — BP 128/78 | HR 87 | Temp 96.7°F | Resp 16 | Ht 66.0 in | Wt 192.7 lb

## 2023-08-17 DIAGNOSIS — Z79811 Long term (current) use of aromatase inhibitors: Secondary | ICD-10-CM | POA: Insufficient documentation

## 2023-08-17 DIAGNOSIS — Z923 Personal history of irradiation: Secondary | ICD-10-CM | POA: Diagnosis not present

## 2023-08-17 DIAGNOSIS — Z170 Estrogen receptor positive status [ER+]: Secondary | ICD-10-CM | POA: Diagnosis not present

## 2023-08-17 DIAGNOSIS — C50411 Malignant neoplasm of upper-outer quadrant of right female breast: Secondary | ICD-10-CM | POA: Insufficient documentation

## 2023-08-17 NOTE — Progress Notes (Signed)
Radiation Oncology Follow up Note  Name: Michelle Walker   Date:   08/17/2023 MRN:  [id number] DOB: [birth year]    This 62 y.o. female presents to the clinic today for over 2-year follow-up status post left chest wall and peripheral emphatic radiation therapy for stage II (T2 N1 M0) ER/PR positive base of mammary carcinoma status post neoadjuvant chemotherapy followed by left modified radical mastectomy  REFERRING PROVIDER: Sherrie Mustache, MD  HPI: Patient is a 62 year old female now out over 2 years having completed left chest wall and peripheral lymphatics radiation therapy for stage II ER/PR positive invasive mammary carcinoma status post neoadjuvant chemotherapy followed by left modified radical mastectomy.  Seen today in routine follow-up she is doing well.  She had a mammogram of her right.  Breast back in December which was BI-RADS 1 benign.  She is currently on Femara tolerating it well without side effect.  She specifically denies any significant lymphedema of her left upper extremity or any chest wall discomfort.  COMPLICATIONS OF TREATMENT: none  FOLLOW UP COMPLIANCE: keeps appointments   PHYSICAL EXAM:  BP 128/78   Pulse 87   Temp (!) 96.7 F (35.9 C)   Resp 16   Ht 5\' 6"  (1.676 m)   Wt 192 lb 11.2 oz (87.4 kg)   LMP 01/05/2017   BMI 31.10 kg/m  Patient status post left modified radical mastectomy.  No evidence of chest wall mass or nodularity is noted.  Right breast is free of dominant mass no axillary or supraclavicular adenopathy is appreciated.  Well-developed well-nourished patient in NAD. HEENT reveals PERLA, EOMI, discs not visualized.  Oral cavity is clear. No oral mucosal lesions are identified. Neck is clear without evidence of cervical or supraclavicular adenopathy. Lungs are clear to A&P. Cardiac examination is essentially unremarkable with regular rate and rhythm without murmur rub or thrill. Abdomen is benign with no organomegaly or masses noted. Motor sensory  and DTR levels are equal and symmetric in the upper and lower extremities. Cranial nerves II through XII are grossly intact. Proprioception is intact. No peripheral adenopathy or edema is identified. No motor or sensory levels are noted. Crude visual fields are within normal range.  RADIOLOGY RESULTS: No current films for review at the present time   I would like to take this opportunity to thank you for allowing me to participate in the care of your patient.[name]  PLAN: I am going to turn follow-up care over to medical oncology.  Be happy to reevaluate the patient in time should that be indicated.  Patient knows to call with any concerns at any time.    Carmina Miller, MD

## 2023-08-17 NOTE — Telephone Encounter (Signed)
Patient called reporting concern that she has black spots on the bottom of her feet and that one of her toe nails has also turn black. She states that they are not sore or painful at all and was unaware of them  and that they are the size of the end of her pinkie finger and that there are a lot of them. Her pedicurist informed her of them Please advise

## 2023-08-17 NOTE — Telephone Encounter (Signed)
This is not related to any medications that we are giving them. There is not much I can offer.

## 2023-08-18 ENCOUNTER — Encounter: Payer: Self-pay | Admitting: Oncology

## 2023-08-18 NOTE — Telephone Encounter (Signed)
Call returned to patient and informed of doctor response I recommended that she ask her PCP. Patient agreed

## 2023-09-08 ENCOUNTER — Encounter: Payer: Self-pay | Admitting: Cardiovascular Disease

## 2023-09-08 ENCOUNTER — Ambulatory Visit (INDEPENDENT_AMBULATORY_CARE_PROVIDER_SITE_OTHER): Payer: 59 | Admitting: Cardiovascular Disease

## 2023-09-08 VITALS — BP 132/89 | HR 103 | Ht 66.0 in | Wt 196.2 lb

## 2023-09-08 DIAGNOSIS — R000 Tachycardia, unspecified: Secondary | ICD-10-CM

## 2023-09-08 DIAGNOSIS — R55 Syncope and collapse: Secondary | ICD-10-CM

## 2023-09-08 DIAGNOSIS — I2510 Atherosclerotic heart disease of native coronary artery without angina pectoris: Secondary | ICD-10-CM | POA: Diagnosis not present

## 2023-09-08 DIAGNOSIS — I10 Essential (primary) hypertension: Secondary | ICD-10-CM

## 2023-09-08 DIAGNOSIS — E782 Mixed hyperlipidemia: Secondary | ICD-10-CM

## 2023-09-08 DIAGNOSIS — Z720 Tobacco use: Secondary | ICD-10-CM | POA: Diagnosis not present

## 2023-09-08 NOTE — Progress Notes (Signed)
Cardiology Office Note   Date:  09/08/2023   ID:  Michelle Walker, DOB May 31, 1961, MRN [id number]  PCP:  Sherrie Mustache, MD  Cardiologist:  Adrian Blackwater, MD      History of Present Illness: Michelle Walker is a 62 y.o. 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 Wide excision/ RT, T1a,N0. ER/PR pos, Her 2.neg. Tamoxifen x 6 months, d/c secondary to vasomotor sympotms.    Flu 10/08/2022   Headache    HTN (hypertension) 09/20/2017   Hypertension    Hypokalemia 10/10/2022   Hypoxia 02/27/2022   Last Assessment & Plan: Formatting of this note might be different from the original. 62 year old female with history of hypertension, gout, breast cancer left mastectomy on oral hormonal therapy, smoker, alcohol user on daily basis came to the emergency room after passing out.  Fell off from the bar stool. Loss of consciousness is unknown.  Patient found to be hypoxic in the ER.  On room air sats   Migraine 09/20/2017   Multifocal pneumonia 10/08/2022   Pain in joint of right hip 09/10/2021   Peripheral neuropathy    Personal history of radiation therapy 2016   RIGHT lumpectomy   Post-operative state 04/12/2022   Respiratory failure (HCC) 10/06/2022   Thyroid disease      Past Surgical History:  Procedure Laterality Date   ANTERIOR CRUCIATE LIGAMENT REPAIR Right    BREAST BIOPSY Right 07/17/2015   INVASIVE LOBULAR CARCINOMA, CLASSIC TYPE (1.5 MM), ARISING IN A    BREAST BIOPSY Left 09/19/2020   [place] bx 3 areas/ 1oc q clip/ 12 oc vision clip, axilla lymph node hydromark shape 3/ path pending   BREAST LUMPECTOMY Right 08/22/2015   BREAST LUMPECTOMY WITH SENTINEL LYMPH NODE BIOPSY Right 08/22/2015   Procedure: BREAST LUMPECTOMY WITH SENTINEL LYMPH NODE BX;  Surgeon: Kieth Brightly, MD;  Location: [facility];  Service: General;  Laterality: Right;   COLONOSCOPY W/ POLYPECTOMY  2014   COLONOSCOPY WITH PROPOFOL N/A 04/16/2019   Procedure: COLONOSCOPY WITH BIOPSIES;  Surgeon: Midge Minium, MD;  Location: [facility];  Service: Endoscopy;  Laterality: N/A;   DILATION AND CURETTAGE OF UTERUS     20 years ago   LAPAROSCOPIC VAGINAL HYSTERECTOMY WITH SALPINGO OOPHORECTOMY Bilateral 04/12/2022   Procedure: LAPAROSCOPIC ASSISTED VAGINAL HYSTERECTOMY WITH SALPINGO OOPHORECTOMY;  Surgeon: Linzie Collin, MD;  Location: [facility];  Service: Gynecology;  Laterality: Bilateral;   MASTECTOMY Left    2021   NOVASURE ABLATION     POLYPECTOMY N/A 04/16/2019   Procedure: POLYPECTOMY;  Surgeon: Midge Minium, MD;  Location: [facility];  Service: Endoscopy;  Laterality: N/A;   PORTACATH PLACEMENT Right 12/01/2020   Procedure: INSERTION PORT-A-CATH;  Surgeon: Campbell Lerner, MD;  Location: [facility];  Service: General;  Laterality: Right;   TOTAL MASTECTOMY Left 05/06/2021   Procedure: modified radical mastectomy;  Surgeon: Campbell Lerner, MD;  Location: [facility];  Service: General;  Laterality: Left;  Provider requesting 1.5 hours / 90 minutes for procedure     Current Outpatient Medications  Medication Sig Dispense Refill   spironolactone (ALDACTONE) 25 MG tablet Take 25 mg by mouth daily.     albuterol (VENTOLIN HFA) 108 (90 Base)  MCG/ACT inhaler Inhale 2 puffs into the lungs every 6 (six) hours as needed for wheezing or shortness of breath. 8 g 2   allopurinol (ZYLOPRIM) 100 MG tablet Take 100 mg by mouth at bedtime.     amLODipine (NORVASC) 10 MG tablet Take 10 mg by mouth daily.     aspirin EC 81 MG tablet Take 1 tablet (81 mg total) by mouth daily. Swallow whole. 180 tablet 1   atorvastatin (LIPITOR) 10 MG tablet Take 10 mg by mouth daily.     Calcium Carb-Cholecalciferol (CALCIUM 600+D3 PO) Take 1 tablet by mouth at bedtime.     DULoxetine (CYMBALTA)  60 MG capsule TAKE 1 CAPSULE(60 MG) BY MOUTH DAILY 30 capsule 1   letrozole (FEMARA) 2.5 MG tablet TAKE 1 TABLET(2.5 MG) BY MOUTH DAILY. START AFTER RADIATION IS OVER 90 tablet 1   losartan (COZAAR) 100 MG tablet Take 100 mg by mouth daily.     metoprolol (TOPROL XL) 200 MG 24 hr tablet Take 1 tablet (200 mg total) by mouth daily. 30 tablet 11   pregabalin (LYRICA) 75 MG capsule TAKE 1 CAPSULE(75 MG) BY MOUTH TWICE DAILY 60 capsule 3   No current facility-administered medications for this visit.    Allergies:   Triamcinolone and Tape    Social History:   reports that she has been smoking cigarettes. She has a 30 pack-year smoking history. She has never used smokeless tobacco. She reports current alcohol use of about 2.0 standard drinks of alcohol per week. She reports that she does not use drugs.   Family History:  family history includes Cancer in her sister; Diabetes in her brother and sister; Stroke in her mother and sister.    ROS:     Review of Systems  Constitutional: Negative.   HENT: Negative.    Eyes: Negative.   Respiratory: Negative.    Gastrointestinal: Negative.   Genitourinary: Negative.   Musculoskeletal: Negative.   Skin: Negative.   Neurological: Negative.   Endo/Heme/Allergies: Negative.   Psychiatric/Behavioral: Negative.    All other systems reviewed and are negative.     All other systems are reviewed and negative.    PHYSICAL EXAM: VS:  BP 132/89   Pulse (!) 103   Ht 5\' 6"  (1.676 m)   Wt 196 lb 3.2 oz (89 kg)   LMP 01/05/2017   SpO2 96%   BMI 31.67 kg/m  , BMI Body mass index is 31.67 kg/m. Last weight:  Wt Readings from Last 3 Encounters:  09/08/23 196 lb 3.2 oz (89 kg)  08/17/23 192 lb 11.2 oz (87.4 kg)  07/28/23 192 lb 6.4 oz (87.3 kg)     Physical Exam Constitutional:      Appearance: Normal appearance.  Cardiovascular:     Rate and Rhythm: Normal rate and regular rhythm.     Heart sounds: Normal heart sounds.  Pulmonary:      Effort: Pulmonary effort is normal.     Breath sounds: Normal breath sounds.  Musculoskeletal:     Right lower leg: No edema.     Left lower leg: No edema.  Neurological:     Mental Status: She is alert.       EKG:   Recent Labs: 10/06/2022: B Natriuretic Peptide 549.8 10/08/2022: Magnesium 2.7 10/09/2022: Hemoglobin 12.3; Platelets 199 07/05/2023: ALT 19; BUN 15; Creatinine, Ser 0.72; Potassium 4.0; Sodium 135    Lipid Panel    Component Value Date/Time   CHOL 161 01/29/2021 0827   TRIG  119 01/29/2021 0827   HDL 42 01/29/2021 0827   CHOLHDL 3.8 01/29/2021 0827   VLDL 24 01/29/2021 0827   LDLCALC 95 01/29/2021 0827      Other studies Reviewed: Additional studies/ records that were reviewed today include:  Review of the above records demonstrates:       No data to display            ASSESSMENT AND PLAN:    ICD-10-CM   1. Primary hypertension  I10    bp much better, on losatan/hctz    2. Coronary artery disease involving native coronary artery of native heart without angina pectoris  I25.10     3. Tobacco use  Z72.0     4. Tachycardia  R00.0     5. Mixed hyperlipidemia  E78.2     6. Vasovagal syncope  R55        Problem List Items Addressed This Visit       Cardiovascular and Mediastinum   Primary hypertension - Primary (Chronic)   Relevant Medications   spironolactone (ALDACTONE) 25 MG tablet   Coronary artery disease involving native coronary artery of native heart without angina pectoris   Relevant Medications   spironolactone (ALDACTONE) 25 MG tablet     Other   Tobacco use (Chronic)   Tachycardia   Vasovagal syncope   Mixed hyperlipidemia   Relevant Medications   spironolactone (ALDACTONE) 25 MG tablet       Disposition:   Return in about 2 months (around 11/08/2023).    Total time spent: 30 minutes  Signed,  Adrian Blackwater, MD  09/08/2023 3:35 PM    [facility]

## 2023-10-11 ENCOUNTER — Other Ambulatory Visit: Payer: Self-pay | Admitting: Oncology

## 2023-11-04 ENCOUNTER — Other Ambulatory Visit: Payer: Self-pay | Admitting: Hospice and Palliative Medicine

## 2023-11-10 ENCOUNTER — Ambulatory Visit: Payer: 59 | Admitting: Cardiovascular Disease

## 2023-11-10 ENCOUNTER — Encounter: Payer: Self-pay | Admitting: Cardiovascular Disease

## 2023-11-10 VITALS — BP 128/78 | HR 101 | Ht 66.0 in | Wt 195.0 lb

## 2023-11-10 DIAGNOSIS — Z720 Tobacco use: Secondary | ICD-10-CM | POA: Diagnosis not present

## 2023-11-10 DIAGNOSIS — I2510 Atherosclerotic heart disease of native coronary artery without angina pectoris: Secondary | ICD-10-CM

## 2023-11-10 DIAGNOSIS — I10 Essential (primary) hypertension: Secondary | ICD-10-CM

## 2023-11-10 DIAGNOSIS — R002 Palpitations: Secondary | ICD-10-CM

## 2023-11-10 DIAGNOSIS — R55 Syncope and collapse: Secondary | ICD-10-CM

## 2023-11-10 DIAGNOSIS — R000 Tachycardia, unspecified: Secondary | ICD-10-CM

## 2023-11-10 DIAGNOSIS — E782 Mixed hyperlipidemia: Secondary | ICD-10-CM | POA: Diagnosis not present

## 2023-11-10 MED ORDER — METOPROLOL SUCCINATE ER 100 MG PO TB24: 100.0000 mg | ORAL_TABLET | Freq: Every day | ORAL | 11 refills | Status: DC

## 2023-11-10 NOTE — Progress Notes (Signed)
 Cardiology Office Note   Date:  11/10/2023   ID:  Michelle Walker, DOB [birth year], MRN [id number]  PCP:  Weyman Bright, MD  Cardiologist:  Denyse Bathe, MD      History of Present Illness: Michelle Walker is a 63 y.o. 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 Wide excision/ RT, T1a,N0. ER/PR pos, Her 2.neg. Tamoxifen  x 6 months, d/c secondary to vasomotor sympotms.    Flu 10/08/2022   Headache    HTN (hypertension) 09/20/2017   Hypertension    Hypokalemia 10/10/2022   Hypoxia 02/27/2022   Last Assessment & Plan: Formatting of this note might be different from the original. 63 year old female with history of hypertension, gout, breast cancer left mastectomy on oral hormonal therapy, smoker, alcohol user on daily basis came to the emergency room after passing out.  Fell off from the bar stool. Loss of consciousness is unknown.  Patient found to be hypoxic in the ER.  On room air sats   Migraine 09/20/2017   Multifocal pneumonia 10/08/2022   Pain in joint of right hip 09/10/2021   Peripheral neuropathy    Personal history of radiation therapy 2016   RIGHT lumpectomy   Post-operative state 04/12/2022   Respiratory failure (HCC) 10/06/2022   Thyroid  disease      Past Surgical History:  Procedure Laterality Date   ANTERIOR CRUCIATE LIGAMENT REPAIR Right    BREAST BIOPSY Right 07/17/2015   INVASIVE LOBULAR CARCINOMA, CLASSIC TYPE (1.5 MM), ARISING IN A    BREAST BIOPSY Left 09/19/2020   us  bx 3 areas/ 1oc q clip/ 12 oc vision clip, axilla lymph node hydromark shape 3/ path pending   BREAST LUMPECTOMY Right 08/22/2015   BREAST LUMPECTOMY WITH SENTINEL LYMPH NODE BIOPSY Right 08/22/2015   Procedure: BREAST LUMPECTOMY  WITH SENTINEL LYMPH NODE BX;  Surgeon: Louanne KANDICE Muse, MD;  Location: [facility];  Service: General;  Laterality: Right;   COLONOSCOPY W/ POLYPECTOMY  2014   COLONOSCOPY WITH PROPOFOL  N/A 04/16/2019   Procedure: COLONOSCOPY WITH BIOPSIES;  Surgeon: Jinny Carmine, MD;  Location: [facility];  Service: Endoscopy;  Laterality: N/A;   DILATION AND CURETTAGE OF UTERUS     20 years ago   LAPAROSCOPIC VAGINAL HYSTERECTOMY WITH SALPINGO OOPHORECTOMY Bilateral 04/12/2022   Procedure: LAPAROSCOPIC ASSISTED VAGINAL HYSTERECTOMY WITH SALPINGO OOPHORECTOMY;  Surgeon: Janit Alm Agent, MD;  Location: [facility];  Service: Gynecology;  Laterality: Bilateral;   MASTECTOMY Left    2021   NOVASURE ABLATION     POLYPECTOMY N/A 04/16/2019   Procedure: POLYPECTOMY;  Surgeon: Jinny Carmine, MD;  Location: [facility];  Service: Endoscopy;  Laterality: N/A;   PORTACATH PLACEMENT Right 12/01/2020   Procedure: INSERTION PORT-A-CATH;  Surgeon: Lane Shope, MD;  Location: [facility];  Service: General;  Laterality: Right;   TOTAL MASTECTOMY Left 05/06/2021   Procedure: modified radical mastectomy;  Surgeon: Lane Shope, MD;  Location: [facility];  Service: General;  Laterality: Left;  Provider requesting 1.5 hours / 90 minutes for procedure     Current Outpatient Medications  Medication Sig Dispense Refill   metoprolol  succinate (TOPROL  XL) 100 MG 24 hr tablet Take 1  tablet (100 mg total) by mouth daily. Take with or immediately following a meal. 30 tablet 11   albuterol  (VENTOLIN  HFA) 108 (90 Base) MCG/ACT inhaler Inhale 2 puffs into the lungs every 6 (six) hours as needed for wheezing or shortness of breath. 8 g 2   allopurinol  (ZYLOPRIM ) 100 MG tablet Take 100 mg by mouth at bedtime.     amLODipine  (NORVASC ) 10 MG tablet Take 10 mg by mouth daily.     aspirin  EC 81 MG tablet Take 1 tablet (81 mg total) by mouth daily. Swallow whole. 180 tablet 1   atorvastatin  (LIPITOR) 10 MG tablet Take  10 mg by mouth daily.     Calcium  Carb-Cholecalciferol (CALCIUM  600+D3 PO) Take 1 tablet by mouth at bedtime.     DULoxetine  (CYMBALTA ) 60 MG capsule TAKE 1 CAPSULE(60 MG) BY MOUTH DAILY 30 capsule 1   letrozole  (FEMARA ) 2.5 MG tablet TAKE 1 TABLET(2.5 MG) BY MOUTH DAILY. START AFTER RADIATION IS OVER 90 tablet 1   losartan  (COZAAR ) 100 MG tablet Take 100 mg by mouth daily.     metoprolol  (TOPROL  XL) 200 MG 24 hr tablet Take 1 tablet (200 mg total) by mouth daily. 30 tablet 11   pregabalin  (LYRICA ) 75 MG capsule TAKE 1 CAPSULE(75 MG) BY MOUTH TWICE DAILY 60 capsule 0   spironolactone  (ALDACTONE ) 25 MG tablet Take 25 mg by mouth daily.     No current facility-administered medications for this visit.    Allergies:   Triamcinolone and Tape    Social History:   reports that she has been smoking cigarettes. She has a 30 pack-year smoking history. She has never used smokeless tobacco. She reports current alcohol use of about 2.0 standard drinks of alcohol per week. She reports that she does not use drugs.   Family History:  family history includes Cancer in her sister; Diabetes in her brother and sister; Stroke in her mother and sister.    ROS:     Review of Systems  Constitutional: Negative.   HENT: Negative.    Eyes: Negative.   Respiratory: Negative.    Gastrointestinal: Negative.   Genitourinary: Negative.   Musculoskeletal: Negative.   Skin: Negative.   Neurological: Negative.   Endo/Heme/Allergies: Negative.   Psychiatric/Behavioral: Negative.    All other systems reviewed and are negative.     All other systems are reviewed and negative.    PHYSICAL EXAM: VS:  BP 128/78   Pulse (!) 101   Ht 5' 6 (1.676 m)   Wt 195 lb (88.5 kg)   LMP 01/05/2017   SpO2 93%   BMI 31.47 kg/m  , BMI Body mass index is 31.47 kg/m. Last weight:  Wt Readings from Last 3 Encounters:  11/10/23 195 lb (88.5 kg)  09/08/23 196 lb 3.2 oz (89 kg)  08/17/23 192 lb 11.2 oz (87.4 kg)      Physical Exam Constitutional:      Appearance: Normal appearance.  Cardiovascular:     Rate and Rhythm: Normal rate and regular rhythm.     Heart sounds: Normal heart sounds.  Pulmonary:     Effort: Pulmonary effort is normal.     Breath sounds: Normal breath sounds.  Musculoskeletal:     Right lower leg: No edema.     Left lower leg: No edema.  Neurological:     Mental Status: She is alert.       EKG:   Recent Labs: 07/05/2023: ALT 19; BUN 15; Creatinine, Ser 0.72; Potassium  4.0; Sodium 135    Lipid Panel    Component Value Date/Time   CHOL 161 01/29/2021 0827   TRIG 119 01/29/2021 0827   HDL 42 01/29/2021 0827   CHOLHDL 3.8 01/29/2021 0827   VLDL 24 01/29/2021 0827   LDLCALC 95 01/29/2021 0827      Other studies Reviewed: Additional studies/ records that were reviewed today include:  Review of the above records demonstrates:       No data to display            ASSESSMENT AND PLAN:    ICD-10-CM   1. Primary hypertension  I10 CARDIAC EVENT MONITOR    metoprolol  succinate (TOPROL  XL) 100 MG 24 hr tablet    2. Coronary artery disease involving native coronary artery of native heart without angina pectoris  I25.10 CARDIAC EVENT MONITOR    metoprolol  succinate (TOPROL  XL) 100 MG 24 hr tablet    3. Tobacco use  Z72.0 CARDIAC EVENT MONITOR    metoprolol  succinate (TOPROL  XL) 100 MG 24 hr tablet    4. Mixed hyperlipidemia  E78.2 CARDIAC EVENT MONITOR    metoprolol  succinate (TOPROL  XL) 100 MG 24 hr tablet    5. Tachycardia  R00.0 CARDIAC EVENT MONITOR    metoprolol  succinate (TOPROL  XL) 100 MG 24 hr tablet    6. Vasovagal syncope  R55 CARDIAC EVENT MONITOR    metoprolol  succinate (TOPROL  XL) 100 MG 24 hr tablet    7. Palpitation  R00.2 CARDIAC EVENT MONITOR    metoprolol  succinate (TOPROL  XL) 100 MG 24 hr tablet   On metoprolol  200 and still has palpitation with fast heart beat, amy have afib. Advise Holter. Add  metoprolol  100 mg to m ake 300  daily       Problem List Items Addressed This Visit       Cardiovascular and Mediastinum   Primary hypertension - Primary (Chronic)   Relevant Medications   metoprolol  succinate (TOPROL  XL) 100 MG 24 hr tablet   Other Relevant Orders   CARDIAC EVENT MONITOR   Coronary artery disease involving native coronary artery of native heart without angina pectoris   Relevant Medications   metoprolol  succinate (TOPROL  XL) 100 MG 24 hr tablet   Other Relevant Orders   CARDIAC EVENT MONITOR     Other   Tobacco use (Chronic)   Relevant Medications   metoprolol  succinate (TOPROL  XL) 100 MG 24 hr tablet   Other Relevant Orders   CARDIAC EVENT MONITOR   Tachycardia   Relevant Medications   metoprolol  succinate (TOPROL  XL) 100 MG 24 hr tablet   Other Relevant Orders   CARDIAC EVENT MONITOR   Vasovagal syncope   Relevant Medications   metoprolol  succinate (TOPROL  XL) 100 MG 24 hr tablet   Other Relevant Orders   CARDIAC EVENT MONITOR   Mixed hyperlipidemia   Relevant Medications   metoprolol  succinate (TOPROL  XL) 100 MG 24 hr tablet   Other Relevant Orders   CARDIAC EVENT MONITOR   Other Visit Diagnoses       Palpitation       On metoprolol  200 and still has palpitation with fast heart beat, amy have afib. Advise Holter. Add  metoprolol  100 mg to m ake 300 daily   Relevant Medications   metoprolol  succinate (TOPROL  XL) 100 MG 24 hr tablet   Other Relevant Orders   CARDIAC EVENT MONITOR          Disposition:   Return in about 5 weeks (around  12/15/2023) for Set up holter and f/u.    Total time spent: 30 minutes  Signed,  Denyse Bathe, MD  11/10/2023 3:38 PM    [facility]

## 2023-11-14 ENCOUNTER — Ambulatory Visit: Payer: 59

## 2023-11-14 DIAGNOSIS — R55 Syncope and collapse: Secondary | ICD-10-CM

## 2023-11-14 DIAGNOSIS — I2510 Atherosclerotic heart disease of native coronary artery without angina pectoris: Secondary | ICD-10-CM

## 2023-11-14 DIAGNOSIS — R000 Tachycardia, unspecified: Secondary | ICD-10-CM

## 2023-11-14 DIAGNOSIS — Z720 Tobacco use: Secondary | ICD-10-CM

## 2023-11-14 DIAGNOSIS — R002 Palpitations: Secondary | ICD-10-CM

## 2023-11-14 DIAGNOSIS — I10 Essential (primary) hypertension: Secondary | ICD-10-CM

## 2023-11-14 DIAGNOSIS — E782 Mixed hyperlipidemia: Secondary | ICD-10-CM

## 2023-11-28 DIAGNOSIS — R55 Syncope and collapse: Secondary | ICD-10-CM | POA: Diagnosis not present

## 2023-12-02 ENCOUNTER — Ambulatory Visit: Payer: 59 | Admitting: Cardiovascular Disease

## 2023-12-06 ENCOUNTER — Encounter: Payer: Self-pay | Admitting: Cardiovascular Disease

## 2023-12-06 ENCOUNTER — Ambulatory Visit (INDEPENDENT_AMBULATORY_CARE_PROVIDER_SITE_OTHER): Payer: 59 | Admitting: Cardiovascular Disease

## 2023-12-06 VITALS — BP 134/78 | HR 112 | Ht 66.0 in | Wt 196.0 lb

## 2023-12-06 DIAGNOSIS — R002 Palpitations: Secondary | ICD-10-CM | POA: Diagnosis not present

## 2023-12-06 DIAGNOSIS — I10 Essential (primary) hypertension: Secondary | ICD-10-CM | POA: Diagnosis not present

## 2023-12-06 DIAGNOSIS — Z720 Tobacco use: Secondary | ICD-10-CM

## 2023-12-06 DIAGNOSIS — E782 Mixed hyperlipidemia: Secondary | ICD-10-CM

## 2023-12-06 DIAGNOSIS — I471 Supraventricular tachycardia, unspecified: Secondary | ICD-10-CM

## 2023-12-06 MED ORDER — FLECAINIDE ACETATE 50 MG PO TABS: 50.0000 mg | ORAL_TABLET | Freq: Two times a day (BID) | ORAL | 11 refills | Status: DC

## 2023-12-06 MED ORDER — SOTALOL HCL 120 MG PO TABS: 120.0000 mg | ORAL_TABLET | Freq: Two times a day (BID) | ORAL | 11 refills | Status: DC

## 2023-12-06 NOTE — Progress Notes (Signed)
 Cardiology Office Note   Date:  12/06/2023   ID:  Michelle Walker, DOB [birth year], MRN [id number]  PCP:  Weyman Bright, MD  Cardiologist:  Denyse Bathe, MD      History of Present Illness: Michelle Walker is a 63 y.o. female who presents for  Chief Complaint  Patient presents with   Follow-up    Holter Results    Patient was started on 300 mg of metoprolol  as heart rate continued to be high.  Still high at 112/min.  Holter monitor revealed SVT 166 bpm with intermittent sinus tachycardia.  Patient states that she has a high stressful job also and is having headaches after going from 200 to 300 mg of metoprolol .  However the Holter showed SVT 166/min.      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 Wide excision/ RT, T1a,N0. ER/PR pos, Her 2.neg. Tamoxifen  x 6 months, d/c secondary to vasomotor sympotms.    Flu 10/08/2022   Headache    HTN (hypertension) 09/20/2017   Hypertension    Hypokalemia 10/10/2022   Hypoxia 02/27/2022   Last Assessment & Plan: Formatting of this note might be different from the original. 63 year old female with history of hypertension, gout, breast cancer left mastectomy on oral hormonal therapy, smoker, alcohol user on daily basis came to the emergency room after passing out.  Fell off from the bar stool. Loss of consciousness is unknown.  Patient found to be hypoxic in the ER.  On room air sats   Migraine 09/20/2017   Multifocal pneumonia 10/08/2022   Pain in joint of right hip 09/10/2021   Peripheral neuropathy    Personal history of radiation therapy 2016   RIGHT lumpectomy   Post-operative state 04/12/2022   Respiratory failure (HCC) 10/06/2022   Thyroid  disease      Past Surgical History:  Procedure Laterality Date   ANTERIOR CRUCIATE LIGAMENT REPAIR Right    BREAST BIOPSY Right 07/17/2015   INVASIVE LOBULAR CARCINOMA, CLASSIC  TYPE (1.5 MM), ARISING IN A    BREAST BIOPSY Left 09/19/2020   us  bx 3 areas/ 1oc q clip/ 12 oc vision clip, axilla lymph node hydromark shape 3/ path pending   BREAST LUMPECTOMY Right 08/22/2015   BREAST LUMPECTOMY WITH SENTINEL LYMPH NODE BIOPSY Right 08/22/2015   Procedure: BREAST LUMPECTOMY WITH SENTINEL LYMPH NODE BX;  Surgeon: Louanne KANDICE Muse, MD;  Location: [facility];  Service: General;  Laterality: Right;   COLONOSCOPY W/ POLYPECTOMY  2014   COLONOSCOPY WITH PROPOFOL  N/A 04/16/2019   Procedure: COLONOSCOPY WITH BIOPSIES;  Surgeon: Jinny Carmine, MD;  Location: [facility];  Service: Endoscopy;  Laterality: N/A;   DILATION AND CURETTAGE OF UTERUS     20 years ago   LAPAROSCOPIC VAGINAL HYSTERECTOMY WITH SALPINGO OOPHORECTOMY Bilateral 04/12/2022   Procedure: LAPAROSCOPIC ASSISTED VAGINAL HYSTERECTOMY WITH SALPINGO OOPHORECTOMY;  Surgeon: Janit Alm Agent, MD;  Location: [facility];  Service: Gynecology;  Laterality: Bilateral;   MASTECTOMY Left    2021   NOVASURE ABLATION     POLYPECTOMY N/A 04/16/2019   Procedure: POLYPECTOMY;  Surgeon: Jinny Carmine, MD;  Location: [facility];  Service: Endoscopy;  Laterality: N/A;   PORTACATH PLACEMENT Right 12/01/2020   Procedure: INSERTION PORT-A-CATH;  Surgeon: Lane Shope, MD;  Location: [facility];  Service: General;  Laterality: Right;   TOTAL MASTECTOMY Left 05/06/2021  Procedure: modified radical mastectomy;  Surgeon: Lane Shope, MD;  Location: [facility];  Service: General;  Laterality: Left;  Provider requesting 1.5 hours / 90 minutes for procedure     Current Outpatient Medications  Medication Sig Dispense Refill   albuterol  (VENTOLIN  HFA) 108 (90 Base) MCG/ACT inhaler Inhale 2 puffs into the lungs every 6 (six) hours as needed for wheezing or shortness of breath. 8 g 2   allopurinol  (ZYLOPRIM ) 100 MG tablet Take 100 mg by mouth at bedtime.     amLODipine  (NORVASC ) 10 MG tablet Take 10 mg by mouth daily.      aspirin  EC 81 MG tablet Take 1 tablet (81 mg total) by mouth daily. Swallow whole. 180 tablet 1   atorvastatin  (LIPITOR) 10 MG tablet Take 10 mg by mouth daily.     Calcium  Carb-Cholecalciferol (CALCIUM  600+D3 PO) Take 1 tablet by mouth at bedtime.     DULoxetine  (CYMBALTA ) 60 MG capsule TAKE 1 CAPSULE(60 MG) BY MOUTH DAILY 30 capsule 1   letrozole  (FEMARA ) 2.5 MG tablet TAKE 1 TABLET(2.5 MG) BY MOUTH DAILY. START AFTER RADIATION IS OVER 90 tablet 1   losartan  (COZAAR ) 100 MG tablet Take 100 mg by mouth daily.     pregabalin  (LYRICA ) 75 MG capsule TAKE 1 CAPSULE(75 MG) BY MOUTH TWICE DAILY 60 capsule 0   sotalol  (BETAPACE ) 120 MG tablet Take 1 tablet (120 mg total) by mouth 2 (two) times daily. 60 tablet 11   spironolactone  (ALDACTONE ) 25 MG tablet Take 25 mg by mouth daily.     No current facility-administered medications for this visit.    Allergies:   Triamcinolone and Tape    Social History:   reports that she has been smoking cigarettes. She has a 30 pack-year smoking history. She has never used smokeless tobacco. She reports current alcohol use of about 2.0 standard drinks of alcohol per week. She reports that she does not use drugs.   Family History:  family history includes Cancer in her sister; Diabetes in her brother and sister; Stroke in her mother and sister.    ROS:     ROS    All other systems are reviewed and negative.    PHYSICAL EXAM: VS:  BP 134/78   Pulse (!) 112   Ht 5' 6 (1.676 m)   Wt 196 lb (88.9 kg)   LMP 01/05/2017   SpO2 94%   BMI 31.64 kg/m  , BMI Body mass index is 31.64 kg/m. Last weight:  Wt Readings from Last 3 Encounters:  12/06/23 196 lb (88.9 kg)  11/10/23 195 lb (88.5 kg)  09/08/23 196 lb 3.2 oz (89 kg)     Physical Exam    EKG:   Recent Labs: 07/05/2023: ALT 19; BUN 15; Creatinine, Ser 0.72; Potassium 4.0; Sodium 135    Lipid Panel    Component Value Date/Time   CHOL 161 01/29/2021 0827   TRIG 119 01/29/2021 0827   HDL  42 01/29/2021 0827   CHOLHDL 3.8 01/29/2021 0827   VLDL 24 01/29/2021 0827   LDLCALC 95 01/29/2021 0827      Other studies Reviewed: Additional studies/ records that were reviewed today include:  Review of the above records demonstrates:       No data to display            ASSESSMENT AND PLAN:    ICD-10-CM   1. Mixed hyperlipidemia  E78.2 sotalol  (BETAPACE ) 120 MG tablet    PCV ECHOCARDIOGRAM COMPLETE  PCV ECHOCARDIOGRAM COMPLETE    2. Tobacco use  Z72.0 sotalol  (BETAPACE ) 120 MG tablet    PCV ECHOCARDIOGRAM COMPLETE    PCV ECHOCARDIOGRAM COMPLETE    3. Primary hypertension  I10 sotalol  (BETAPACE ) 120 MG tablet    PCV ECHOCARDIOGRAM COMPLETE    PCV ECHOCARDIOGRAM COMPLETE    4. Palpitation  R00.2 sotalol  (BETAPACE ) 120 MG tablet    PCV ECHOCARDIOGRAM COMPLETE    PCV ECHOCARDIOGRAM COMPLETE   Also will do echocardiogram.    5. SVT (supraventricular tachycardia) (HCC)  I47.10 sotalol  (BETAPACE ) 120 MG tablet    PCV ECHOCARDIOGRAM COMPLETE    PCV ECHOCARDIOGRAM COMPLETE   Patient has history of 60% RCA disease with calcium  score 700 but normal left ventricular systolic function.  Will change to sotalol  instead of metoprolol .       Problem List Items Addressed This Visit       Cardiovascular and Mediastinum   Primary hypertension (Chronic)   Relevant Medications   sotalol  (BETAPACE ) 120 MG tablet   Other Relevant Orders   PCV ECHOCARDIOGRAM COMPLETE   PCV ECHOCARDIOGRAM COMPLETE     Other   Tobacco use (Chronic)   Relevant Medications   sotalol  (BETAPACE ) 120 MG tablet   Other Relevant Orders   PCV ECHOCARDIOGRAM COMPLETE   PCV ECHOCARDIOGRAM COMPLETE   Mixed hyperlipidemia - Primary   Relevant Medications   sotalol  (BETAPACE ) 120 MG tablet   Other Relevant Orders   PCV ECHOCARDIOGRAM COMPLETE   PCV ECHOCARDIOGRAM COMPLETE   Other Visit Diagnoses       Palpitation       Also will do echocardiogram.   Relevant Medications   sotalol  (BETAPACE )  120 MG tablet   Other Relevant Orders   PCV ECHOCARDIOGRAM COMPLETE   PCV ECHOCARDIOGRAM COMPLETE     SVT (supraventricular tachycardia) (HCC)       Patient has history of 60% RCA disease with calcium  score 700 but normal left ventricular systolic function.  Will change to sotalol  instead of metoprolol .   Relevant Medications   sotalol  (BETAPACE ) 120 MG tablet   Other Relevant Orders   PCV ECHOCARDIOGRAM COMPLETE   PCV ECHOCARDIOGRAM COMPLETE          Disposition:   Return in about 1 year (around 12/05/2024) for echo and f/u 1 week.    Total time spent: 30 minutes  Signed,  Denyse Bathe, MD  12/06/2023 4:10 PM    [facility]

## 2023-12-11 ENCOUNTER — Other Ambulatory Visit: Payer: Self-pay | Admitting: Oncology

## 2023-12-12 ENCOUNTER — Encounter: Payer: Self-pay | Admitting: Internal Medicine

## 2023-12-14 ENCOUNTER — Other Ambulatory Visit: Payer: Self-pay | Admitting: Hospice and Palliative Medicine

## 2023-12-15 ENCOUNTER — Other Ambulatory Visit: Payer: Self-pay | Admitting: Hospice and Palliative Medicine

## 2023-12-15 ENCOUNTER — Telehealth: Payer: Self-pay | Admitting: *Deleted

## 2023-12-15 ENCOUNTER — Encounter: Payer: Self-pay | Admitting: Oncology

## 2023-12-15 ENCOUNTER — Ambulatory Visit: Payer: 59

## 2023-12-15 ENCOUNTER — Telehealth: Payer: Self-pay

## 2023-12-15 DIAGNOSIS — I351 Nonrheumatic aortic (valve) insufficiency: Secondary | ICD-10-CM | POA: Diagnosis not present

## 2023-12-15 DIAGNOSIS — I361 Nonrheumatic tricuspid (valve) insufficiency: Secondary | ICD-10-CM | POA: Diagnosis not present

## 2023-12-15 DIAGNOSIS — R002 Palpitations: Secondary | ICD-10-CM

## 2023-12-15 DIAGNOSIS — I371 Nonrheumatic pulmonary valve insufficiency: Secondary | ICD-10-CM

## 2023-12-15 DIAGNOSIS — I10 Essential (primary) hypertension: Secondary | ICD-10-CM

## 2023-12-15 DIAGNOSIS — I340 Nonrheumatic mitral (valve) insufficiency: Secondary | ICD-10-CM

## 2023-12-15 DIAGNOSIS — I471 Supraventricular tachycardia, unspecified: Secondary | ICD-10-CM

## 2023-12-15 DIAGNOSIS — E782 Mixed hyperlipidemia: Secondary | ICD-10-CM

## 2023-12-15 DIAGNOSIS — Z720 Tobacco use: Secondary | ICD-10-CM

## 2023-12-15 NOTE — Telephone Encounter (Signed)
Contacted patient in regards to her colonoscopy referral.  Informed her that her colonoscopy will be due in June of 2025.  We will call her back to schedule. Last performed by Dr. Servando Snare 04/16/19.  Thanks,  [place], [place]

## 2023-12-15 NOTE — Telephone Encounter (Signed)
i will send a refill for dr Smith Robert to sign and call the pt. And let her know. Dr. Smith Robert will need to sign for the refill.

## 2023-12-16 ENCOUNTER — Encounter: Payer: Self-pay | Admitting: Oncology

## 2023-12-16 ENCOUNTER — Ambulatory Visit: Payer: 59 | Admitting: Cardiovascular Disease

## 2023-12-16 ENCOUNTER — Encounter: Payer: Self-pay | Admitting: Cardiovascular Disease

## 2023-12-16 VITALS — BP 118/70 | HR 95 | Ht 66.0 in | Wt 196.0 lb

## 2023-12-16 DIAGNOSIS — F17210 Nicotine dependence, cigarettes, uncomplicated: Secondary | ICD-10-CM

## 2023-12-16 DIAGNOSIS — R55 Syncope and collapse: Secondary | ICD-10-CM

## 2023-12-16 DIAGNOSIS — I10 Essential (primary) hypertension: Secondary | ICD-10-CM

## 2023-12-16 DIAGNOSIS — I471 Supraventricular tachycardia, unspecified: Secondary | ICD-10-CM

## 2023-12-16 DIAGNOSIS — E782 Mixed hyperlipidemia: Secondary | ICD-10-CM | POA: Diagnosis not present

## 2023-12-16 DIAGNOSIS — I2510 Atherosclerotic heart disease of native coronary artery without angina pectoris: Secondary | ICD-10-CM

## 2023-12-16 DIAGNOSIS — Z720 Tobacco use: Secondary | ICD-10-CM | POA: Diagnosis not present

## 2023-12-16 NOTE — Progress Notes (Signed)
Cardiology Office Note   Date:  12/16/2023   ID:  Michelle Walker, DOB 1961-03-16, MRN [id number]  PCP:  Sherrie Mustache, MD  Cardiologist:  Adrian Blackwater, MD      History of Present Illness: Michelle Walker is a 63 y.o. 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 Wide excision/ RT, T1a,N0. ER/PR pos, Her 2.neg. Tamoxifen x 6 months, d/c secondary to vasomotor sympotms.    Flu 10/08/2022   Headache    HTN (hypertension) 09/20/2017   Hypertension    Hypokalemia 10/10/2022   Hypoxia 02/27/2022   Last Assessment & Plan: Formatting of this note might be different from the original. 63 year old female with history of hypertension, gout, breast cancer left mastectomy on oral hormonal therapy, smoker, alcohol user on daily basis came to the emergency room after passing out.  Fell off from the bar stool. Loss of consciousness is unknown.  Patient found to be hypoxic in the ER.  On room air sats   Migraine 09/20/2017   Multifocal pneumonia 10/08/2022   Pain in joint of right hip 09/10/2021   Peripheral neuropathy    Personal history of radiation therapy 2016   RIGHT lumpectomy   Post-operative state 04/12/2022   Respiratory failure (HCC) 10/06/2022   Thyroid disease      Past Surgical History:  Procedure Laterality Date   ANTERIOR CRUCIATE LIGAMENT REPAIR Right    BREAST BIOPSY Right 07/17/2015   INVASIVE LOBULAR CARCINOMA, CLASSIC TYPE (1.5 MM), ARISING IN A    BREAST BIOPSY Left 09/19/2020   [place] bx 3 areas/ 1oc q clip/ 12 oc vision clip, axilla lymph node hydromark shape 3/ path pending   BREAST LUMPECTOMY Right 08/22/2015   BREAST LUMPECTOMY WITH SENTINEL LYMPH NODE BIOPSY Right 08/22/2015   Procedure: BREAST LUMPECTOMY WITH SENTINEL LYMPH NODE BX;   Surgeon: Kieth Brightly, MD;  Location: [facility];  Service: General;  Laterality: Right;   COLONOSCOPY W/ POLYPECTOMY  2014   COLONOSCOPY WITH PROPOFOL N/A 04/16/2019   Procedure: COLONOSCOPY WITH BIOPSIES;  Surgeon: Midge Minium, MD;  Location: [facility];  Service: Endoscopy;  Laterality: N/A;   DILATION AND CURETTAGE OF UTERUS     20 years ago   LAPAROSCOPIC VAGINAL HYSTERECTOMY WITH SALPINGO OOPHORECTOMY Bilateral 04/12/2022   Procedure: LAPAROSCOPIC ASSISTED VAGINAL HYSTERECTOMY WITH SALPINGO OOPHORECTOMY;  Surgeon: Linzie Collin, MD;  Location: [facility];  Service: Gynecology;  Laterality: Bilateral;   MASTECTOMY Left    2021   NOVASURE ABLATION     POLYPECTOMY N/A 04/16/2019   Procedure: POLYPECTOMY;  Surgeon: Midge Minium, MD;  Location: [facility];  Service: Endoscopy;  Laterality: N/A;   PORTACATH PLACEMENT Right 12/01/2020   Procedure: INSERTION PORT-A-CATH;  Surgeon: Campbell Lerner, MD;  Location: [facility];  Service: General;  Laterality: Right;   TOTAL MASTECTOMY Left 05/06/2021   Procedure: modified radical mastectomy;  Surgeon: Campbell Lerner, MD;  Location: [facility];  Service: General;  Laterality: Left;  Provider requesting 1.5 hours / 90 minutes for procedure     Current Outpatient Medications  Medication Sig Dispense Refill   albuterol (VENTOLIN HFA) 108 (90 Base) MCG/ACT inhaler Inhale 2 puffs into the lungs every 6 (  six) hours as needed for wheezing or shortness of breath. 8 g 2   allopurinol (ZYLOPRIM) 100 MG tablet Take 100 mg by mouth at bedtime.     amLODipine (NORVASC) 10 MG tablet Take 10 mg by mouth daily.     aspirin EC 81 MG tablet Take 1 tablet (81 mg total) by mouth daily. Swallow whole. 180 tablet 1   atorvastatin (LIPITOR) 10 MG tablet Take 10 mg by mouth daily.     Calcium Carb-Cholecalciferol (CALCIUM 600+D3 PO) Take 1 tablet by mouth at bedtime.     DULoxetine (CYMBALTA) 60 MG capsule TAKE 1 CAPSULE(60 MG) BY MOUTH DAILY  30 capsule 1   letrozole (FEMARA) 2.5 MG tablet TAKE 1 TABLET(2.5 MG) BY MOUTH DAILY. START AFTER RADIATION IS OVER 90 tablet 1   losartan (COZAAR) 100 MG tablet Take 100 mg by mouth daily.     pregabalin (LYRICA) 75 MG capsule TAKE 1 CAPSULE(75 MG) BY MOUTH TWICE DAILY 60 capsule 1   sotalol (BETAPACE) 120 MG tablet Take 1 tablet (120 mg total) by mouth 2 (two) times daily. 60 tablet 11   spironolactone (ALDACTONE) 25 MG tablet Take 25 mg by mouth daily.     No current facility-administered medications for this visit.    Allergies:   Triamcinolone and Tape    Social History:   reports that she has been smoking cigarettes. She has a 30 pack-year smoking history. She has never used smokeless tobacco. She reports current alcohol use of about 2.0 standard drinks of alcohol per week. She reports that she does not use drugs.   Family History:  family history includes Cancer in her sister; Diabetes in her brother and sister; Stroke in her mother and sister.    ROS:     Review of Systems  Constitutional: Negative.   HENT: Negative.    Eyes: Negative.   Respiratory: Negative.    Gastrointestinal: Negative.   Genitourinary: Negative.   Musculoskeletal: Negative.   Skin: Negative.   Neurological: Negative.   Endo/Heme/Allergies: Negative.   Psychiatric/Behavioral: Negative.    All other systems reviewed and are negative.     All other systems are reviewed and negative.    PHYSICAL EXAM: VS:  BP 118/70   Pulse 95   Ht 5\' 6"  (1.676 m)   Wt 196 lb (88.9 kg)   LMP 01/05/2017   SpO2 95%   BMI 31.64 kg/m  , BMI Body mass index is 31.64 kg/m. Last weight:  Wt Readings from Last 3 Encounters:  12/16/23 196 lb (88.9 kg)  12/06/23 196 lb (88.9 kg)  11/10/23 195 lb (88.5 kg)     Physical Exam Constitutional:      Appearance: Normal appearance.  Cardiovascular:     Rate and Rhythm: Normal rate and regular rhythm.     Heart sounds: Normal heart sounds.  Pulmonary:     Effort:  Pulmonary effort is normal.     Breath sounds: Normal breath sounds.  Musculoskeletal:     Right lower leg: No edema.     Left lower leg: No edema.  Neurological:     Mental Status: She is alert.       EKG:   Recent Labs: 07/05/2023: ALT 19; BUN 15; Creatinine, Ser 0.72; Potassium 4.0; Sodium 135    Lipid Panel    Component Value Date/Time   CHOL 161 01/29/2021 0827   TRIG 119 01/29/2021 0827   HDL 42 01/29/2021 0827   CHOLHDL 3.8 01/29/2021 0827  VLDL 24 01/29/2021 0827   LDLCALC 95 01/29/2021 0827      Other studies Reviewed: Additional studies/ records that were reviewed today include:  Review of the above records demonstrates:       No data to display            ASSESSMENT AND PLAN:    ICD-10-CM   1. Primary hypertension  I10     2. Coronary artery disease involving native coronary artery of native heart without angina pectoris  I25.10     3. Mixed hyperlipidemia  E78.2     4. Tobacco use  Z72.0     5. Vasovagal syncope  R55     6. SVT (supraventricular tachycardia) (HCC)  I47.10    holter showed 166/min, SVT, and LVEF 67% but moderate to severe pulmonary HTN. Stopped metoprolol 300 and changed to sotolol 120bid       Problem List Items Addressed This Visit       Cardiovascular and Mediastinum   Primary hypertension - Primary (Chronic)   Coronary artery disease involving native coronary artery of native heart without angina pectoris     Other   Tobacco use (Chronic)   Vasovagal syncope   Mixed hyperlipidemia   Other Visit Diagnoses       SVT (supraventricular tachycardia) (HCC)       holter showed 166/min, SVT, and LVEF 67% but moderate to severe pulmonary HTN. Stopped metoprolol 300 and changed to sotolol 120bid          Disposition:   Return in about 6 weeks (around 01/27/2024).    Total time spent: 30 minutes  Signed,  Adrian Blackwater, MD  12/16/2023 3:57 PM    [facility]

## 2023-12-27 ENCOUNTER — Ambulatory Visit: Payer: 59 | Admitting: Obstetrics and Gynecology

## 2024-01-03 ENCOUNTER — Encounter: Payer: Self-pay | Admitting: Oncology

## 2024-01-03 ENCOUNTER — Inpatient Hospital Stay: Payer: 59 | Attending: Oncology | Admitting: Oncology

## 2024-01-03 VITALS — BP 120/86 | HR 109 | Temp 98.3°F | Resp 17 | Wt 199.0 lb

## 2024-01-03 DIAGNOSIS — Z9221 Personal history of antineoplastic chemotherapy: Secondary | ICD-10-CM | POA: Insufficient documentation

## 2024-01-03 DIAGNOSIS — C50812 Malignant neoplasm of overlapping sites of left female breast: Secondary | ICD-10-CM | POA: Diagnosis not present

## 2024-01-03 DIAGNOSIS — Z9012 Acquired absence of left breast and nipple: Secondary | ICD-10-CM | POA: Diagnosis not present

## 2024-01-03 DIAGNOSIS — Z1732 Human epidermal growth factor receptor 2 negative status: Secondary | ICD-10-CM | POA: Insufficient documentation

## 2024-01-03 DIAGNOSIS — Z170 Estrogen receptor positive status [ER+]: Secondary | ICD-10-CM | POA: Insufficient documentation

## 2024-01-03 DIAGNOSIS — C773 Secondary and unspecified malignant neoplasm of axilla and upper limb lymph nodes: Secondary | ICD-10-CM | POA: Diagnosis not present

## 2024-01-03 DIAGNOSIS — C50911 Malignant neoplasm of unspecified site of right female breast: Secondary | ICD-10-CM | POA: Diagnosis present

## 2024-01-03 DIAGNOSIS — G620 Drug-induced polyneuropathy: Secondary | ICD-10-CM | POA: Diagnosis not present

## 2024-01-03 DIAGNOSIS — T451X5A Adverse effect of antineoplastic and immunosuppressive drugs, initial encounter: Secondary | ICD-10-CM

## 2024-01-03 DIAGNOSIS — Z808 Family history of malignant neoplasm of other organs or systems: Secondary | ICD-10-CM

## 2024-01-03 DIAGNOSIS — Z08 Encounter for follow-up examination after completed treatment for malignant neoplasm: Secondary | ICD-10-CM

## 2024-01-03 DIAGNOSIS — F17210 Nicotine dependence, cigarettes, uncomplicated: Secondary | ICD-10-CM | POA: Insufficient documentation

## 2024-01-03 DIAGNOSIS — Z79811 Long term (current) use of aromatase inhibitors: Secondary | ICD-10-CM | POA: Insufficient documentation

## 2024-01-03 DIAGNOSIS — Z1721 Progesterone receptor positive status: Secondary | ICD-10-CM | POA: Insufficient documentation

## 2024-01-03 DIAGNOSIS — Z79899 Other long term (current) drug therapy: Secondary | ICD-10-CM

## 2024-01-03 NOTE — Progress Notes (Signed)
 No new concerns today

## 2024-01-05 ENCOUNTER — Other Ambulatory Visit: Payer: Self-pay | Admitting: Oncology

## 2024-01-05 NOTE — Progress Notes (Signed)
 Hematology/Oncology Consult note [facility]  [phone] [phone]  Patient Care Team: Sherrie Mustache, MD as PCP - General (Internal Medicine) Sherrie Mustache, MD as Consulting Physician (Internal Medicine) Kieth Brightly, MD (General Surgery) Elinor Parkinson, [place] as Consulting Physician (Podiatry) Creig Hines, MD as Consulting Physician (Hematology and Oncology) Campbell Lerner, MD as Consulting Physician (General Surgery) Carmina Miller, MD as Consulting Physician (Radiation Oncology)   Name of the patient: Michelle Walker  [id number]  02-Dec-1960   Date of visit: 01/05/24  Diagnosis- h/o  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  She did not require adjuvant chemotherapy.  She was initially on tamoxifen which she could not tolerate and was switched to letrozole.  Last seen by me in June 2019 at which point she had a menstrual cycle and therefore not given another AI.  She had subsequently did not follow-up with me and remained off hormone therapy.     More recently patient underwent a diagnostic bilateral mammogram which showed a 2.2 x 1.9 x 1.9 cm mass at the 12 o'clock position of the left breast 1 cm from the nipple.  She was also found to have 5 other smaller breast masses ranging from 0.3 to 0.8 cm.  Noted to have a solitary left axillary lymph node with cortical thickening.  She had a biopsy of the dominant left breast mass as well as another smaller breast mass and the left axillary lymph node all of which were positive for metastatic invasive mammary carcinoma grade 2.  Tumor was ER 91 200% positive PR 71 to 80% positive and HER-2  IHC equivocal but negative by FISH   MRI of the bilateral breasts showed no abnormal findings in the right breast.  At least 6 discrete hypoechoic masses in the left breast with the largest one measuring 2.2 cm with 1 abnormal left axillary lymph node.  The size of the other breast masses ranging from 4mm to 1.4 cm.  The total area of masses and non-mass enhancement was about 10 x 5 x 6.5 cm.  CT chest abdomen and pelvis with contrast showed no evidence of distant metastatic disease.  Bone scan was also negative.   MammaPrint done on the biopsy specimen came back as high risk with greater than 12% chemotherapy benefit.  Plan is for neoadjuvant dose dense AC-Taxol chemotherapy followed by surgery.  Start of chemotherapy was delayed since patient tested positive for Covid   Patient completed neoadjuvant ddAC- taxol chemo between feb-June 2022.    Patient underwent left mastectomy with axillary lymph node dissection.  Final pathology showed 12 mm multifocal invasive mammary carcinoma at least 3 of them in number overall grade 2 with negative margins.  Metastatic carcinoma involving 3 out of 12 lymph nodes.  No extranodal extension identified.  Largest site of metastatic deposit 3 mm.  pT1c pN1a   Patient completed adjuvant radiation therapy in October 2022.  patient completed adjuvant zometa as well    Interval history- continues to have symptoms of neuropathy in her feet no better no worse. She is on lyrica  ECOG PS- 1 Pain scale- 4 Opioid associated constipation- no  Review of systems- Review of Systems  Constitutional:  Negative for chills, fever, malaise/fatigue and weight loss.  HENT:  Negative for congestion, ear discharge and nosebleeds.   Eyes:  Negative for blurred vision.  Respiratory:  Negative for cough, hemoptysis, sputum production, shortness of breath and wheezing.   Cardiovascular:  Negative for chest pain, palpitations, orthopnea and claudication.  Gastrointestinal:  Negative  for abdominal pain, blood in stool, constipation, diarrhea, heartburn, melena, nausea and vomiting.  Genitourinary:  Negative for dysuria, flank pain, frequency, hematuria and urgency.  Musculoskeletal:  Negative for back pain, joint pain and myalgias.  Skin:  Negative for rash.  Neurological:  Positive for sensory change (peripheral neuropathy). Negative for dizziness, tingling, focal weakness, seizures, weakness and headaches.  Endo/Heme/Allergies:  Does not bruise/bleed easily.  Psychiatric/Behavioral:  Negative for depression and suicidal ideas. The patient does not have insomnia.       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 Wide excision/ RT, T1a,N0. ER/PR pos, Her 2.neg. Tamoxifen x 6 months, d/c secondary to vasomotor sympotms.    Flu 10/08/2022   Headache    HTN (hypertension) 09/20/2017   Hypertension    Hypokalemia 10/10/2022   Hypoxia 02/27/2022   Last Assessment & Plan: Formatting of this note might be different from the original. 63 year old female with history of hypertension, gout, breast cancer left mastectomy on oral hormonal therapy, smoker, alcohol user on daily basis came to the emergency room after passing out.  Fell off from the bar stool. Loss of consciousness is unknown.  Patient found to be hypoxic in the ER.  On room air sats   Migraine 09/20/2017   Multifocal pneumonia 10/08/2022   Pain in joint of right hip 09/10/2021   Peripheral neuropathy    Personal history of radiation therapy 2016   RIGHT lumpectomy   Post-operative state 04/12/2022   Respiratory failure (HCC) 10/06/2022   Thyroid disease      Past Surgical History:  Procedure Laterality Date   ANTERIOR CRUCIATE LIGAMENT  REPAIR Right    BREAST BIOPSY Right 07/17/2015   INVASIVE LOBULAR CARCINOMA, CLASSIC TYPE (1.5 MM), ARISING IN A    BREAST BIOPSY Left 09/19/2020   [place] bx 3 areas/ 1oc q clip/ 12 oc vision clip, axilla lymph node hydromark shape 3/ path pending   BREAST LUMPECTOMY Right 08/22/2015   BREAST LUMPECTOMY WITH SENTINEL LYMPH NODE BIOPSY Right 08/22/2015   Procedure: BREAST LUMPECTOMY WITH SENTINEL LYMPH NODE BX;  Surgeon: Kieth Brightly, MD;  Location: [facility];  Service: General;  Laterality: Right;   COLONOSCOPY W/ POLYPECTOMY  2014   COLONOSCOPY WITH PROPOFOL N/A 04/16/2019   Procedure: COLONOSCOPY WITH BIOPSIES;  Surgeon: Midge Minium, MD;  Location: [facility];  Service: Endoscopy;  Laterality: N/A;   DILATION AND CURETTAGE OF UTERUS     20 years ago   LAPAROSCOPIC VAGINAL HYSTERECTOMY WITH SALPINGO OOPHORECTOMY Bilateral 04/12/2022   Procedure: LAPAROSCOPIC ASSISTED VAGINAL HYSTERECTOMY WITH SALPINGO OOPHORECTOMY;  Surgeon: Linzie Collin, MD;  Location: [facility];  Service: Gynecology;  Laterality: Bilateral;   MASTECTOMY Left    2021   NOVASURE ABLATION     POLYPECTOMY N/A 04/16/2019   Procedure: POLYPECTOMY;  Surgeon: Midge Minium, MD;  Location: [facility];  Service: Endoscopy;  Laterality: N/A;   PORTACATH PLACEMENT Right 12/01/2020   Procedure: INSERTION PORT-A-CATH;  Surgeon: Campbell Lerner, MD;  Location: [facility];  Service: General;  Laterality: Right;   TOTAL MASTECTOMY Left 05/06/2021   Procedure: modified radical mastectomy;  Surgeon: Campbell Lerner, MD;  Location: [facility];  Service: General;  Laterality: Left;  Provider requesting 1.5 hours / 90 minutes for procedure    Social History   Socioeconomic History   Marital status: Married    Spouse name: Vick   Number of children: 1   Years of education: Not on file   Highest education level: Not on file  Occupational History   Not on file  Tobacco Use   Smoking status: Every Day     Current packs/day: 0.75    Average packs/day: 0.8 packs/day for 40.0 years (30.0 ttl pk-yrs)    Types: Cigarettes   Smokeless tobacco: Never  Vaping Use   Vaping status: Never Used  Substance and Sexual Activity   Alcohol use: Yes    Alcohol/week: 2.0 standard drinks of alcohol    Types: 2 Standard drinks or equivalent per week    Comment: BEER 1-2 QD   Drug use: No   Sexual activity: Yes    Birth control/protection: Surgical    Comment: Hysterectomy  Other Topics Concern   Not on file  Social History Narrative   Daughter and 3 kids in home, her Brother is in the home as well.   Social Drivers of Corporate investment banker Strain: Not on file  Food Insecurity: Not on file  Transportation Needs: Not on file  Physical Activity: Not on file  Stress: Not on file  Social Connections: Not on file  Intimate Partner Violence: Not on file    Family History  Problem Relation Age of Onset   Stroke Mother    Cancer Sister        sarcoma on arm; chemo   Diabetes Sister    Stroke Sister    Diabetes Brother    Breast cancer Neg Hx    Ovarian cancer Neg Hx    Colon cancer Neg Hx    Heart disease Neg Hx      Current Outpatient Medications:    albuterol (VENTOLIN HFA) 108 (90 Base) MCG/ACT inhaler, Inhale 2 puffs into the lungs every 6 (six) hours as needed for wheezing or shortness of breath., Disp: 8 g, Rfl: 2   allopurinol (ZYLOPRIM) 100 MG tablet, Take 100 mg by mouth at bedtime., Disp: , Rfl:    amLODipine (NORVASC) 10 MG tablet, Take 10 mg by mouth daily., Disp: , Rfl:    aspirin EC 81 MG tablet, Take 1 tablet (81 mg total) by mouth daily. Swallow whole., Disp: 180 tablet, Rfl: 1   atorvastatin (LIPITOR) 10 MG tablet, Take 10 mg by mouth daily., Disp: , Rfl:    DULoxetine (CYMBALTA) 60 MG capsule, TAKE 1 CAPSULE(60 MG) BY MOUTH DAILY, Disp: 30 capsule, Rfl: 1   letrozole (FEMARA) 2.5 MG tablet, TAKE 1 TABLET(2.5 MG) BY MOUTH DAILY. START AFTER RADIATION IS OVER, Disp: 90  tablet, Rfl: 1   losartan (COZAAR) 100 MG tablet, Take 100 mg by mouth daily., Disp: , Rfl:    pregabalin (LYRICA) 75 MG capsule, TAKE 1 CAPSULE(75 MG) BY MOUTH TWICE DAILY, Disp: 60 capsule, Rfl: 1   sotalol (BETAPACE) 120 MG tablet, Take 1 tablet (120 mg total) by mouth 2 (two) times daily., Disp: 60  tablet, Rfl: 11   spironolactone (ALDACTONE) 25 MG tablet, Take 25 mg by mouth daily., Disp: , Rfl:    Calcium Carb-Cholecalciferol (CALCIUM 600+D3 PO), Take 1 tablet by mouth at bedtime. (Patient not taking: Reported on 01/03/2024), Disp: , Rfl:   Physical exam:  Vitals:   01/03/24 1310  BP: 120/86  Pulse: (!) 109  Resp: 17  Temp: 98.3 F (36.8 C)  TempSrc: Tympanic  SpO2: 98%  Weight: 199 lb (90.3 kg)   Physical Exam Cardiovascular:     Rate and Rhythm: Normal rate and regular rhythm.     Heart sounds: Normal heart sounds.  Pulmonary:     Effort: Pulmonary effort is normal.     Breath sounds: Normal breath sounds.  Abdominal:     General: Bowel sounds are normal.     Palpations: Abdomen is soft.  Skin:    General: Skin is warm and dry.  Neurological:     Mental Status: She is alert and oriented to person, place, and time.  Breast exam: Patient is s/p left mastectomy and right lumpectomy.  No evidence of chest wall recurrence on the left side.  No palpable right breast masses.  No palpable bilateral axillary adenopathy.     Latest Ref Rng & Units 07/05/2023   12:39 PM  CMP  Glucose 70 - 99 mg/dL 86   BUN 6 - 20 mg/dL 15   Creatinine 1.61 - 1.00 mg/dL 0.96   Sodium 045 - 409 mmol/L 135   Potassium 3.5 - 5.1 mmol/L 4.0   Chloride 98 - 111 mmol/L 102   CO2 22 - 32 mmol/L 23   Calcium 8.9 - 10.3 mg/dL 81.1   Total Protein 6.5 - 8.1 g/dL 7.9   Total Bilirubin 0.3 - 1.2 mg/dL 0.4   Alkaline Phos 38 - 126 U/L 67   AST 15 - 41 U/L 27   ALT 0 - 44 U/L 19       Latest Ref Rng & Units 10/09/2022    5:16 AM  CBC  WBC 4.0 - 10.5 K/uL 9.5   Hemoglobin 12.0 - 15.0 g/dL 91.4    Hematocrit 78.2 - 46.0 % 38.6   Platelets 150 - 400 K/uL 199     No images are attached to the encounter.  PCV ECHOCARDIOGRAM COMPLETE Result Date: 12/16/2023 Images from the original result were not included. Reason for Visit  Echocardiogram INDICATIONS:   Palpitations, Tachycardia, Hyperlipidemia. Echocardiogram: An echocardiogram in (2-d) mode was performed and in Doppler mode with color flow velocity mapping was performed. ventricular septum thickness 0.525 cm, L ventricular posterior wall thickness (diastole) 1.29 cm, left atrium size 3.7 cm, aortic root diameter 3.1 cm, L ventricle diastolic dimension 4.86 cm, L ventricle systolic dimension 3.18, L ventricle ejection fraction 63.7 %, and LV fractional shortening 34.6 % L ventricular outflow tract internal diameter 3.2 cm, L ventricular outflow tract flow velocity .677 m/s, aortic valve cusps 1.6 cm , aortic valve flow velocity .62 (m/sec), aortic valve systolic calculated mean flow gradient 1 mmHg, mitral valve diastolic peak flow velocity E .447 m/sec, and mitral valve diastolic peak flow E/A ratio 0.7 Mitral valve has trace regurgitation Aortic valve has  normal regurgitation Pulmonic valve has normal regurgitation Tricuspid valve has trace regurgitation ASSESSMENT Suboptimal study due to poor windows. Mildly dilated Left Atrium Mildly dilated right Atrium. Mildly dilated right ventricle. Normal left ventricular systolic function. Mild left ventricular hypertrophy with GRADE 1(relaxation abnormality) diastolic dysfunction. Normal left ventricular wall motion. Normal  pulmonary regurgitation. Trace tricuspid regurgitation. Moderate pulmonary hypertension. Trace mitral regurgitation. Normal Aortic regurgitation. No pericardial effusion.   PCV ECHOCARDIOGRAM COMPLETE Addendum Date: 12/16/2023 Reason for Visit  Echocardiogram INDICATIONS:   Palpitations, Tachycardia, Hyperlipidemia. Echocardiogram: An echocardiogram in (2-d) mode was performed and in  Doppler mode with color flow velocity mapping was performed. ventricular septum thickness 0.525 cm, L ventricular posterior wall thickness (diastole) 1.29 cm, left atrium size 3.7 cm, aortic root diameter 3.1 cm, L ventricle diastolic dimension 4.86 cm, L ventricle systolic dimension 3.18, L ventricle ejection fraction 63.7 %, and LV fractional shortening 34.6 % L ventricular outflow tract internal diameter 3.2 cm, L ventricular outflow tract flow velocity .677 m/s, aortic valve cusps 1.6 cm , aortic valve flow velocity .62 (m/sec), aortic valve systolic calculated mean flow gradient 1 mmHg, mitral valve diastolic peak flow velocity E .447 m/sec, and mitral valve diastolic peak flow E/A ratio 0.7 Mitral valve has trace regurgitation Aortic valve has  normal regurgitation Pulmonic valve has normal regurgitation Tricuspid valve has trace regurgitation ASSESSMENT Suboptimal study due to poor windows. Mildly dilated Left Atrium Mildly dilated right Atrium. Mildly dilated right ventricle. Normal left ventricular systolic function. Mild left ventricular hypertrophy with GRADE 1(relaxation abnormality) diastolic dysfunction. Normal left ventricular wall motion. Normal pulmonary regurgitation. Trace tricuspid regurgitation. Moderate pulmonary hypertension. Trace mitral regurgitation. Normal Aortic regurgitation. No pericardial effusion.   Result Date: 12/16/2023 Images from the original result were not included. Reason for Visit  Echocardiogram INDICATIONS:   Palpitations, Tachycardia, Hyperlipidemia. Echocardiogram: An echocardiogram in (2-d) mode was performed and in Doppler mode with color flow velocity mapping was performed. ventricular septum thickness 0.525 cm, L ventricular posterior wall thickness (diastole) 1.29 cm, left atrium size 3.7 cm, aortic root diameter 3.1 cm, L ventricle diastolic dimension 4.86 cm, L ventricle systolic dimension 3.18, L ventricle ejection fraction 63.7 %, and LV fractional shortening  34.6 % L ventricular outflow tract internal diameter 3.2 cm, L ventricular outflow tract flow velocity .677 m/s, aortic valve cusps 1.6 cm , aortic valve flow velocity .62 (m/sec), aortic valve systolic calculated mean flow gradient 1 mmHg, mitral valve diastolic peak flow velocity E .447 m/sec, and mitral valve diastolic peak flow E/A ratio 0.7 Mitral valve has trace regurgitation Aortic valve has  normal regurgitation Pulmonic valve has normal regurgitation Tricuspid valve has trace regurgitation ASSESSMENT Unable to visualize and evaluate right side of heart. Suboptimal study due to poor windows. Mildly dilated Left Atrium Mildly dilated right Atrium. Mildly dilated right ventricle. Normal left ventricular systolic function. Mild left ventricular hypertrophy with GRADE 1(relaxation abnormality) diastolic dysfunction. Normal left ventricular wall motion. Normal pulmonary regurgitation. Trace tricuspid regurgitation. Moderate pulmonary hypertension. Trace mitral regurgitation. Normal Aortic regurgitation. No pericardial effusion.     Assessment and plan- Patient is a 63 y.o. female history of right breast cancer s/p lumpectomy now with anatomical stage IIA left breast invasive mammary carcinoma [id number] ER/PR positive and HER-2 negative.  she is s/p 4 cycles of dose dense AC and 12 weekly cycles of taxol neoadjuvant chemotherapy.  Patient underwent left mastectomy without reconstruction and final pathology showed multifocal invasive mammary carcinoma [id number].  She is here for routine follow-up of breast cancer  Clinically patient is doing well with no concerning signs and symptoms of recurrence based on today's exam.  Patient is currently on letrozole which she started in October 2022 and will at least continue for 5 years ending in 2027 if not 10 years.  She will continue taking calcium and vitamin  D.  She has completed 3 years of adjuvant Zometa.She is overdue for her mammogram which we will  schedule.  Chemo-induced peripheral neuropathy: Chronic and presently she is on Lyrica.  Continue to monitor  I will see her back in 6 months no labs   Visit Diagnosis 1. Encounter for follow-up surveillance of breast cancer   2. Chemotherapy-induced peripheral neuropathy (HCC)   3. Use of letrozole (Femara)      Dr. Owens Shark, MD, MPH [facility] [phone] 01/05/2024 2:34 PM

## 2024-01-06 ENCOUNTER — Other Ambulatory Visit: Payer: Self-pay

## 2024-01-06 ENCOUNTER — Ambulatory Visit: Payer: 59 | Admitting: Cardiovascular Disease

## 2024-01-06 ENCOUNTER — Encounter: Payer: Self-pay | Admitting: Oncology

## 2024-01-06 MED ORDER — LETROZOLE 2.5 MG PO TABS: 2.5000 mg | ORAL_TABLET | Freq: Every day | ORAL | 1 refills | Status: DC

## 2024-01-10 ENCOUNTER — Other Ambulatory Visit: Payer: Self-pay | Admitting: Oncology

## 2024-01-11 ENCOUNTER — Other Ambulatory Visit: Payer: Self-pay

## 2024-01-11 ENCOUNTER — Encounter: Payer: Self-pay | Admitting: Oncology

## 2024-01-11 MED ORDER — DULOXETINE HCL 60 MG PO CPEP: 60.0000 mg | ORAL_CAPSULE | Freq: Every day | ORAL | 1 refills | Status: DC

## 2024-01-12 ENCOUNTER — Encounter

## 2024-01-16 ENCOUNTER — Encounter: Payer: Self-pay | Admitting: Oncology

## 2024-01-18 ENCOUNTER — Encounter: Payer: Self-pay | Admitting: Obstetrics and Gynecology

## 2024-01-18 ENCOUNTER — Other Ambulatory Visit (HOSPITAL_COMMUNITY)
Admission: RE | Admit: 2024-01-18 | Discharge: 2024-01-18 | Disposition: A | Discharge status: Home / Self Care | Source: Ambulatory Visit | Attending: Obstetrics and Gynecology | Admitting: Obstetrics and Gynecology

## 2024-01-18 ENCOUNTER — Ambulatory Visit (INDEPENDENT_AMBULATORY_CARE_PROVIDER_SITE_OTHER): Payer: 59 | Admitting: Obstetrics and Gynecology

## 2024-01-18 VITALS — BP 116/80 | HR 90 | Ht 66.0 in | Wt 198.5 lb

## 2024-01-18 DIAGNOSIS — Z1272 Encounter for screening for malignant neoplasm of vagina: Secondary | ICD-10-CM

## 2024-01-18 DIAGNOSIS — Z01419 Encounter for gynecological examination (general) (routine) without abnormal findings: Secondary | ICD-10-CM | POA: Diagnosis present

## 2024-01-18 DIAGNOSIS — Z1231 Encounter for screening mammogram for malignant neoplasm of breast: Secondary | ICD-10-CM

## 2024-01-18 NOTE — Progress Notes (Signed)
 Patients presents for annual exam today. She states doing well, no complaints today. Due for vaginal pap smear, ordered. Patient is due for mammogram, states she will be scheduling this today. Labs preformed by PCP.

## 2024-01-18 NOTE — Progress Notes (Signed)
 HPI:      [name] is a 63 y.o. [id number] who LMP was Patient's last menstrual period was 01/05/2017.  Subjective:   She presents today for her annual examination.  She reports no problems.  Her PCP does her lab work.  She has recently had a breast exam as well. Significant history includes history of hysterectomy for CIN-3.    Hx: The following portions of the patient's history were reviewed and updated as appropriate:             She  has a past medical history of Anemia, Arthritis, Breast cancer (HCC) (08/2015), Flu (10/08/2022), Headache, HTN (hypertension) (09/20/2017), Hypertension, Hypokalemia (10/10/2022), Hypoxia (02/27/2022), Migraine (09/20/2017), Multifocal pneumonia (10/08/2022), Pain in joint of right hip (09/10/2021), Peripheral neuropathy, Personal history of radiation therapy (2016), Post-operative state (04/12/2022), Respiratory failure (HCC) (10/06/2022), and Thyroid disease. She does not have any pertinent problems on file. She  has a past surgical history that includes Colonoscopy w/ polypectomy (2014); Anterior cruciate ligament repair (Right); Novasure ablation; Breast lumpectomy with sentinel lymph node bx (Right, 08/22/2015); Dilation and curettage of uterus; Colonoscopy with propofol (N/A, 04/16/2019); polypectomy (N/A, 04/16/2019); Breast lumpectomy (Right, 08/22/2015); Breast biopsy (Right, 07/17/2015); Breast biopsy (Left, 09/19/2020); Portacath placement (Right, 12/01/2020); Total mastectomy (Left, 05/06/2021); Mastectomy (Left); and Laparoscopic vaginal hysterectomy with salpingo oophorectomy (Bilateral, 04/12/2022). Her family history includes Cancer in her sister; Diabetes in her brother and sister; Stroke in her mother and sister. She  reports that she has been smoking cigarettes. She has a 30 pack-year smoking history. She has never used smokeless tobacco. She reports current alcohol use of about 2.0 standard drinks of alcohol per week. She reports that she  does not use drugs. She has a current medication list which includes the following prescription(s): albuterol, allopurinol, amlodipine, aspirin ec, atorvastatin, duloxetine, letrozole, losartan, pregabalin, sotalol, and spironolactone. She is allergic to triamcinolone and tape.       Review of Systems:  Review of Systems  Constitutional: Denied constitutional symptoms, night sweats, recent illness, fatigue, fever, insomnia and weight loss.  Eyes: Denied eye symptoms, eye pain, photophobia, vision change and visual disturbance.  Ears/Nose/Throat/Neck: Denied ear, nose, throat or neck symptoms, hearing loss, nasal discharge, sinus congestion and sore throat.  Cardiovascular: Denied cardiovascular symptoms, arrhythmia, chest pain/pressure, edema, exercise intolerance, orthopnea and palpitations.  Respiratory: Denied pulmonary symptoms, asthma, pleuritic pain, productive sputum, cough, dyspnea and wheezing.  Gastrointestinal: Denied, gastro-esophageal reflux, melena, nausea and vomiting.  Genitourinary: Denied genitourinary symptoms including symptomatic vaginal discharge, pelvic relaxation issues, and urinary complaints.  Musculoskeletal: Denied musculoskeletal symptoms, stiffness, swelling, muscle weakness and myalgia.  Dermatologic: Denied dermatology symptoms, rash and scar.  Neurologic: Denied neurology symptoms, dizziness, headache, neck pain and syncope.  Psychiatric: Denied psychiatric symptoms, anxiety and depression.  Endocrine: Denied endocrine symptoms including hot flashes and night sweats.   Meds:   Current Outpatient Medications on File Prior to Visit  Medication Sig Dispense Refill   albuterol (VENTOLIN HFA) 108 (90 Base) MCG/ACT inhaler Inhale 2 puffs into the lungs every 6 (six) hours as needed for wheezing or shortness of breath. 8 g 2   allopurinol (ZYLOPRIM) 100 MG tablet Take 100 mg by mouth at bedtime.     amLODipine (NORVASC) 10 MG tablet Take 10 mg by mouth daily.      aspirin EC 81 MG tablet Take 1 tablet (81 mg total) by mouth daily. Swallow whole. 180 tablet 1   atorvastatin (LIPITOR) 10 MG tablet Take 10 mg by  mouth daily.     DULoxetine (CYMBALTA) 60 MG capsule Take 1 capsule (60 mg total) by mouth daily. 30 capsule 1   letrozole (FEMARA) 2.5 MG tablet Take 1 tablet (2.5 mg total) by mouth daily. 90 tablet 1   losartan (COZAAR) 100 MG tablet Take 100 mg by mouth daily.     pregabalin (LYRICA) 75 MG capsule TAKE 1 CAPSULE(75 MG) BY MOUTH TWICE DAILY 60 capsule 1   sotalol (BETAPACE) 120 MG tablet Take 1 tablet (120 mg total) by mouth 2 (two) times daily. 60 tablet 11   spironolactone (ALDACTONE) 25 MG tablet Take 25 mg by mouth daily.     No current facility-administered medications on file prior to visit.     Objective:     Vitals:   01/18/24 1501  BP: 116/80  Pulse: 90    Filed Weights   01/18/24 1501  Weight: 198 lb 8 oz (90 kg)              Physical examination General NAD, Conversant  HEENT Atraumatic; Op clear with mmm.  Normo-cephalic.  Anicteric sclerae  Thyroid/Neck Smooth without nodularity or enlargement. Normal ROM.  Neck Supple.  Skin No rashes, lesions or ulceration. Normal palpated skin turgor. No nodularity.  Breasts: Deferred by patient  Abdomen: Soft.  Non-tender.  No masses.  No HSM. No hernia  Extremities: Moves all appropriately.  Normal ROM for age. No lymphadenopathy.  Neuro: Oriented to PPT.  Normal mood. Normal affect.     Pelvic:   Vulva: Normal appearance.  No lesions.  Vagina: No lesions or abnormalities noted.  Cuff appears healed well  Support: Normal pelvic support.  Urethra No masses tenderness or scarring.  Meatus Normal size without lesions or prolapse.  Cervix: Surgically absent  Anus: Normal exam.  No lesions.  Perineum: Normal exam.  No lesions.        Bimanual   Uterus: Surgically absent  Adnexae: No masses.  Non-tender to palpation.  Cul-de-sac: Negative for abnormality.     Assessment:     G2P2002 Patient Active Problem List   Diagnosis Date Noted   Coronary artery disease involving native coronary artery of native heart without angina pectoris 05/31/2023   Mixed hyperlipidemia 05/31/2023   Tachycardia 10/08/2022   Chemotherapy-induced neuropathy (HCC) 04/02/2022   Alcohol use 02/27/2022   Gout of multiple sites 02/27/2022   Tobacco use 02/27/2022   Vasovagal syncope 02/27/2022   Primary hypertension 02/27/2022   Trochanteric bursitis of right hip 09/10/2021   S/P mastectomy, left 05/12/2021   Breast cancer metastasized to axillary lymph node, right (HCC) 05/06/2021   Prophylaxis for chemotherapy-induced neutropenia 01/16/2021   Genetic testing 11/26/2020   Goals of care, counseling/discussion 11/04/2020   Breast cancer, left (HCC) 11/04/2020   History of colonic polyps    Polyp of ascending colon    Depression 09/20/2017   DM2 (diabetes mellitus, type 2) (HCC) 09/20/2017   Gout 09/20/2017   History of endometrial ablation 02/26/2016   Malignant neoplasm of upper-outer quadrant of left breast in female, estrogen receptor positive (HCC) 08/04/2015     1. Well woman exam with routine gynecological exam   2. Screening for vaginal cancer     Doing well without issue.   Plan:            1.  Basic Screening Recommendations The basic screening recommendations for asymptomatic women were discussed with the patient during her visit.  The age-appropriate recommendations were discussed with her and the rational  for the tests reviewed.  When I am informed by the patient that another primary care physician has previously obtained the age-appropriate tests and they are up-to-date, only outstanding tests are ordered and referrals given as necessary.  Abnormal results of tests will be discussed with her when all of her results are completed.  Routine preventative health maintenance measures emphasized: Exercise/Diet/Weight control, Tobacco Warnings, Alcohol/Substance use  risks and Stress Management With history of CIN-3-this is her first vaginal cuff Pap. 2.  Mammogram as scheduled 3.  Plan follow-up Pap in 1 year. Orders No orders of the defined types were placed in this encounter.   No orders of the defined types were placed in this encounter.        F/U  Return in about 1 year (around 01/17/2025) for Annual Physical.  Elonda Husky, M.D. 01/18/2024 3:16 PM

## 2024-01-24 LAB — CYTOLOGY - PAP
Comment: NEGATIVE
Diagnosis: NEGATIVE
High risk HPV: NEGATIVE

## 2024-01-27 ENCOUNTER — Ambulatory Visit: Payer: 59 | Admitting: Cardiovascular Disease

## 2024-01-27 ENCOUNTER — Ambulatory Visit: Admitting: Cardiovascular Disease

## 2024-02-03 ENCOUNTER — Ambulatory Visit: Admitting: Cardiovascular Disease

## 2024-02-10 ENCOUNTER — Ambulatory Visit: Admitting: Cardiovascular Disease

## 2024-02-20 NOTE — Telephone Encounter (Signed)
 Unable to contact patient to see if she would like to schedule her colonoscopy.  Voicemail is currently full.  Thanks, [place], [place]

## 2024-02-21 ENCOUNTER — Ambulatory Visit
Admission: RE | Admit: 2024-02-21 | Discharge: 2024-02-21 | Disposition: A | Discharge status: Home / Self Care | Source: Ambulatory Visit | Attending: Oncology | Admitting: Oncology

## 2024-02-21 DIAGNOSIS — Z853 Personal history of malignant neoplasm of breast: Secondary | ICD-10-CM | POA: Insufficient documentation

## 2024-02-21 DIAGNOSIS — Z1231 Encounter for screening mammogram for malignant neoplasm of breast: Secondary | ICD-10-CM | POA: Insufficient documentation

## 2024-02-21 DIAGNOSIS — Z08 Encounter for follow-up examination after completed treatment for malignant neoplasm: Secondary | ICD-10-CM

## 2024-02-24 ENCOUNTER — Encounter: Payer: Self-pay | Admitting: Cardiovascular Disease

## 2024-02-24 ENCOUNTER — Ambulatory Visit: Admitting: Cardiovascular Disease

## 2024-02-24 VITALS — BP 110/70 | HR 101 | Ht 66.0 in | Wt 195.0 lb

## 2024-02-24 DIAGNOSIS — R002 Palpitations: Secondary | ICD-10-CM

## 2024-02-24 DIAGNOSIS — Z720 Tobacco use: Secondary | ICD-10-CM

## 2024-02-24 DIAGNOSIS — I2510 Atherosclerotic heart disease of native coronary artery without angina pectoris: Secondary | ICD-10-CM

## 2024-02-24 DIAGNOSIS — E782 Mixed hyperlipidemia: Secondary | ICD-10-CM | POA: Diagnosis not present

## 2024-02-24 DIAGNOSIS — I471 Supraventricular tachycardia, unspecified: Secondary | ICD-10-CM

## 2024-02-24 NOTE — Progress Notes (Signed)
 Cardiology Office Note   Date:  02/24/2024   ID:  Michelle Walker, DOB [birth year], MRN [id number]  PCP:  Annelle Kiel, MD  Cardiologist:  Debborah Fairly, MD      History of Present Illness: Michelle Walker is a 63 y.o. 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 Wide excision/ RT, T1a,N0. ER/PR pos, Her 2.neg. Tamoxifen  x 6 months, d/c secondary to vasomotor sympotms.    Flu 10/08/2022   Headache    HTN (hypertension) 09/20/2017   Hypertension    Hypokalemia 10/10/2022   Hypoxia 02/27/2022   Last Assessment & Plan: Formatting of this note might be different from the original. 63 year old female with history of hypertension, gout, breast cancer left mastectomy on oral hormonal therapy, smoker, alcohol user on daily basis came to the emergency room after passing out.  Fell off from the bar stool. Loss of consciousness is unknown.  Patient found to be hypoxic in the ER.  On room air sats   Migraine 09/20/2017   Multifocal pneumonia 10/08/2022   Pain in joint of right hip 09/10/2021   Peripheral neuropathy    Personal history of radiation therapy 2016   RIGHT lumpectomy   Post-operative state 04/12/2022   Respiratory failure (HCC) 10/06/2022   Thyroid  disease      Past Surgical History:  Procedure Laterality Date   ANTERIOR CRUCIATE LIGAMENT REPAIR Right    BREAST BIOPSY Right 07/17/2015   INVASIVE LOBULAR CARCINOMA, CLASSIC TYPE (1.5 MM), ARISING IN A    BREAST BIOPSY Left 09/19/2020   us  bx 3 areas/ 1oc q clip/ 12 oc vision clip, axilla lymph node hydromark shape 3/ path pending   BREAST LUMPECTOMY Right 08/22/2015   BREAST LUMPECTOMY WITH SENTINEL LYMPH NODE BIOPSY Right 08/22/2015   Procedure: BREAST LUMPECTOMY WITH  SENTINEL LYMPH NODE BX;  Surgeon: Jerlean Mood, MD;  Location: [facility];  Service: General;  Laterality: Right;   COLONOSCOPY W/ POLYPECTOMY  2014   COLONOSCOPY WITH PROPOFOL  N/A 04/16/2019   Procedure: COLONOSCOPY WITH BIOPSIES;  Surgeon: Marnee Sink, MD;  Location: [facility];  Service: Endoscopy;  Laterality: N/A;   DILATION AND CURETTAGE OF UTERUS     20 years ago   LAPAROSCOPIC VAGINAL HYSTERECTOMY WITH SALPINGO OOPHORECTOMY Bilateral 04/12/2022   Procedure: LAPAROSCOPIC ASSISTED VAGINAL HYSTERECTOMY WITH SALPINGO OOPHORECTOMY;  Surgeon: Zenobia Hila, MD;  Location: [facility];  Service: Gynecology;  Laterality: Bilateral;   MASTECTOMY Left    2021   NOVASURE ABLATION     POLYPECTOMY N/A 04/16/2019   Procedure: POLYPECTOMY;  Surgeon: Marnee Sink, MD;  Location: [facility];  Service: Endoscopy;  Laterality: N/A;   PORTACATH PLACEMENT Right 12/01/2020   Procedure: INSERTION PORT-A-CATH;  Surgeon: Flynn Hylan, MD;  Location: [facility];  Service: General;  Laterality: Right;   TOTAL MASTECTOMY Left 05/06/2021   Procedure: modified radical mastectomy;  Surgeon: Flynn Hylan, MD;  Location: [facility];  Service: General;  Laterality: Left;  Provider requesting 1.5 hours / 90 minutes for procedure     Current Outpatient Medications  Medication Sig Dispense Refill   albuterol  (VENTOLIN  HFA) 108 (90 Base) MCG/ACT inhaler Inhale 2 puffs  into the lungs every 6 (six) hours as needed for wheezing or shortness of breath. 8 g 2   allopurinol  (ZYLOPRIM ) 100 MG tablet Take 100 mg by mouth at bedtime.     amLODipine  (NORVASC ) 10 MG tablet Take 10 mg by mouth daily.     aspirin  EC 81 MG tablet Take 1 tablet (81 mg total) by mouth daily. Swallow whole. 180 tablet 1   atorvastatin  (LIPITOR) 10 MG tablet Take 10 mg by mouth daily.     DULoxetine  (CYMBALTA ) 60 MG capsule Take 1 capsule (60 mg total) by mouth daily. 30 capsule 1   letrozole  (FEMARA ) 2.5 MG tablet Take 1  tablet (2.5 mg total) by mouth daily. 90 tablet 1   losartan  (COZAAR ) 100 MG tablet Take 100 mg by mouth daily.     pregabalin  (LYRICA ) 75 MG capsule TAKE 1 CAPSULE(75 MG) BY MOUTH TWICE DAILY 60 capsule 1   sotalol  (BETAPACE ) 120 MG tablet Take 1 tablet (120 mg total) by mouth 2 (two) times daily. 60 tablet 11   spironolactone  (ALDACTONE ) 25 MG tablet Take 25 mg by mouth daily.     No current facility-administered medications for this visit.    Allergies:   Triamcinolone and Tape    Social History:   reports that she has been smoking cigarettes. She has a 30 pack-year smoking history. She has never used smokeless tobacco. She reports current alcohol use of about 2.0 standard drinks of alcohol per week. She reports that she does not use drugs.   Family History:  family history includes Cancer in her sister; Diabetes in her brother and sister; Stroke in her mother and sister.    ROS:     Review of Systems  Constitutional: Negative.   HENT: Negative.    Eyes: Negative.   Respiratory: Negative.    Gastrointestinal: Negative.   Genitourinary: Negative.   Musculoskeletal: Negative.   Skin: Negative.   Neurological: Negative.   Endo/Heme/Allergies: Negative.   Psychiatric/Behavioral: Negative.    All other systems reviewed and are negative.     All other systems are reviewed and negative.    PHYSICAL EXAM: VS:  BP 110/70   Pulse (!) 101   Ht 5\' 6"  (1.676 m)   Wt 195 lb (88.5 kg)   LMP 01/05/2017   SpO2 94%   BMI 31.47 kg/m  , BMI Body mass index is 31.47 kg/m. Last weight:  Wt Readings from Last 3 Encounters:  02/24/24 195 lb (88.5 kg)  01/18/24 198 lb 8 oz (90 kg)  01/03/24 199 lb (90.3 kg)     Physical Exam Constitutional:      Appearance: Normal appearance.  Cardiovascular:     Rate and Rhythm: Normal rate and regular rhythm.     Heart sounds: Normal heart sounds.  Pulmonary:     Effort: Pulmonary effort is normal.     Breath sounds: Normal breath sounds.   Musculoskeletal:     Right lower leg: No edema.     Left lower leg: No edema.  Neurological:     Mental Status: She is alert.       EKG:   Recent Labs: 07/05/2023: ALT 19; BUN 15; Creatinine, Ser 0.72; Potassium 4.0; Sodium 135    Lipid Panel    Component Value Date/Time   CHOL 161 01/29/2021 0827   TRIG 119 01/29/2021 0827   HDL 42 01/29/2021 0827   CHOLHDL 3.8 01/29/2021 0827   VLDL 24 01/29/2021 0827   LDLCALC 95 01/29/2021  3086      Other studies Reviewed: Additional studies/ records that were reviewed today include:  Review of the above records demonstrates:       No data to display            ASSESSMENT AND PLAN:    ICD-10-CM   1. SVT (supraventricular tachycardia) (HCC)  I47.10    on sotolol 120 bid, doing much better, but heart rate over 100, but BP low, so will lave it as such.    2. Palpitation  R00.2    No palpitation after sotolol    3. Tobacco use  Z72.0     4. Mixed hyperlipidemia  E78.2     5. Coronary artery disease involving native coronary artery of native heart without angina pectoris  I25.10        Problem List Items Addressed This Visit       Cardiovascular and Mediastinum   Coronary artery disease involving native coronary artery of native heart without angina pectoris     Other   Tobacco use (Chronic)   Mixed hyperlipidemia   Other Visit Diagnoses       SVT (supraventricular tachycardia) (HCC)    -  Primary   on sotolol 120 bid, doing much better, but heart rate over 100, but BP low, so will lave it as such.     Palpitation       No palpitation after sotolol          Disposition:   Return in about 3 months (around 05/25/2024).    Total time spent: 40 minutes  Signed,  Debborah Fairly, MD  02/24/2024 4:00 PM    [facility]

## 2024-02-25 ENCOUNTER — Other Ambulatory Visit: Payer: Self-pay | Admitting: Oncology

## 2024-02-27 ENCOUNTER — Encounter: Payer: Self-pay | Admitting: Oncology

## 2024-02-29 ENCOUNTER — Other Ambulatory Visit: Payer: Self-pay | Admitting: Cardiovascular Disease

## 2024-03-10 ENCOUNTER — Other Ambulatory Visit: Payer: Self-pay | Admitting: Oncology

## 2024-03-12 ENCOUNTER — Encounter: Payer: Self-pay | Admitting: *Deleted

## 2024-03-13 ENCOUNTER — Encounter: Payer: Self-pay | Admitting: Oncology

## 2024-03-28 ENCOUNTER — Other Ambulatory Visit: Payer: Self-pay | Admitting: Oncology

## 2024-03-30 ENCOUNTER — Other Ambulatory Visit: Payer: Self-pay

## 2024-03-30 ENCOUNTER — Encounter: Payer: Self-pay | Admitting: Oncology

## 2024-04-01 ENCOUNTER — Encounter: Payer: Self-pay | Admitting: Oncology

## 2024-04-01 MED ORDER — PREGABALIN 75 MG PO CAPS: ORAL_CAPSULE | ORAL | 2 refills | Status: DC

## 2024-04-02 ENCOUNTER — Other Ambulatory Visit: Payer: Self-pay | Admitting: Oncology

## 2024-04-03 ENCOUNTER — Encounter: Payer: Self-pay | Admitting: Oncology

## 2024-04-03 NOTE — Telephone Encounter (Signed)
 Forwarding to Dr. Randy Buttery for review / approval.  It doesn't appear the script on 6/1 was transmitted to the pharmacy.

## 2024-04-26 ENCOUNTER — Encounter: Payer: Self-pay | Admitting: Cardiovascular Disease

## 2024-04-26 ENCOUNTER — Ambulatory Visit: Admitting: Cardiovascular Disease

## 2024-04-26 VITALS — BP 121/78 | HR 124 | Ht 66.0 in | Wt 194.0 lb

## 2024-04-26 DIAGNOSIS — I2510 Atherosclerotic heart disease of native coronary artery without angina pectoris: Secondary | ICD-10-CM

## 2024-04-26 DIAGNOSIS — F17210 Nicotine dependence, cigarettes, uncomplicated: Secondary | ICD-10-CM | POA: Diagnosis not present

## 2024-04-26 DIAGNOSIS — I471 Supraventricular tachycardia, unspecified: Secondary | ICD-10-CM

## 2024-04-26 DIAGNOSIS — Z720 Tobacco use: Secondary | ICD-10-CM

## 2024-04-26 DIAGNOSIS — I10 Essential (primary) hypertension: Secondary | ICD-10-CM

## 2024-04-26 DIAGNOSIS — R0602 Shortness of breath: Secondary | ICD-10-CM

## 2024-04-26 DIAGNOSIS — R0789 Other chest pain: Secondary | ICD-10-CM | POA: Diagnosis not present

## 2024-04-26 DIAGNOSIS — E782 Mixed hyperlipidemia: Secondary | ICD-10-CM

## 2024-04-26 NOTE — Progress Notes (Signed)
 Cardiology Office Note   Date:  04/26/2024   ID:  Michelle Walker, DOB Jan 17, 1961, MRN [id number]  PCP:  Weyman Bright, MD  Cardiologist:  Denyse Bathe, MD      History of Present Illness: Michelle Walker is a 63 y.o. female who presents for  Chief Complaint  Patient presents with   Follow-up    2 month follow up    On sotolol 120 mg daily, no palpitation but has chest pain left sided.  Chest Pain  This is a new problem. The current episode started more than 1 month ago. The onset quality is gradual. The problem has been waxing and waning. The pain is at a severity of 4/10. The pain is moderate. The quality of the pain is described as sharp.      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 Wide excision/ RT, T1a,N0. ER/PR pos, Her 2.neg. Tamoxifen  x 6 months, d/c secondary to vasomotor sympotms.    Flu 10/08/2022   Headache    HTN (hypertension) 09/20/2017   Hypertension    Hypokalemia 10/10/2022   Hypoxia 02/27/2022   Last Assessment & Plan: Formatting of this note might be different from the original. 63 year old female with history of hypertension, gout, breast cancer left mastectomy on oral hormonal therapy, smoker, alcohol user on daily basis came to the emergency room after passing out.  Fell off from the bar stool. Loss of consciousness is unknown.  Patient found to be hypoxic in the ER.  On room air sats   Migraine 09/20/2017   Multifocal pneumonia 10/08/2022   Pain in joint of right hip 09/10/2021   Peripheral neuropathy    Personal history of radiation therapy 2016   RIGHT lumpectomy   Post-operative state 04/12/2022   Respiratory failure (HCC) 10/06/2022   Thyroid  disease      Past Surgical History:  Procedure Laterality Date   ANTERIOR CRUCIATE LIGAMENT REPAIR Right    BREAST BIOPSY Right 07/17/2015   INVASIVE LOBULAR CARCINOMA, CLASSIC TYPE  (1.5 MM), ARISING IN A    BREAST BIOPSY Left 09/19/2020   us  bx 3 areas/ 1oc q clip/ 12 oc vision clip, axilla lymph node hydromark shape 3/ path pending   BREAST LUMPECTOMY Right 08/22/2015   BREAST LUMPECTOMY WITH SENTINEL LYMPH NODE BIOPSY Right 08/22/2015   Procedure: BREAST LUMPECTOMY WITH SENTINEL LYMPH NODE BX;  Surgeon: Louanne KANDICE Muse, MD;  Location: [facility];  Service: General;  Laterality: Right;   COLONOSCOPY W/ POLYPECTOMY  2014   COLONOSCOPY WITH PROPOFOL  N/A 04/16/2019   Procedure: COLONOSCOPY WITH BIOPSIES;  Surgeon: Jinny Carmine, MD;  Location: [facility];  Service: Endoscopy;  Laterality: N/A;   DILATION AND CURETTAGE OF UTERUS     20 years ago   LAPAROSCOPIC VAGINAL HYSTERECTOMY WITH SALPINGO OOPHORECTOMY Bilateral 04/12/2022   Procedure: LAPAROSCOPIC ASSISTED VAGINAL HYSTERECTOMY WITH SALPINGO OOPHORECTOMY;  Surgeon: Janit Alm Agent, MD;  Location: [facility];  Service: Gynecology;  Laterality: Bilateral;   MASTECTOMY Left    2021   NOVASURE ABLATION     POLYPECTOMY N/A 04/16/2019   Procedure: POLYPECTOMY;  Surgeon: Jinny Carmine, MD;  Location: [facility];  Service: Endoscopy;  Laterality: N/A;   PORTACATH PLACEMENT Right 12/01/2020   Procedure: INSERTION PORT-A-CATH;  Surgeon: Lane Shope, MD;  Location: [facility];  Service: General;  Laterality: Right;   TOTAL  MASTECTOMY Left 05/06/2021   Procedure: modified radical mastectomy;  Surgeon: Lane Shope, MD;  Location: [facility];  Service: General;  Laterality: Left;  Provider requesting 1.5 hours / 90 minutes for procedure     Current Outpatient Medications  Medication Sig Dispense Refill   albuterol  (VENTOLIN  HFA) 108 (90 Base) MCG/ACT inhaler Inhale 2 puffs into the lungs every 6 (six) hours as needed for wheezing or shortness of breath. 8 g 2   allopurinol  (ZYLOPRIM ) 100 MG tablet Take 100 mg by mouth at bedtime.     amLODipine  (NORVASC ) 10 MG tablet Take 10 mg by mouth daily.      aspirin  EC 81 MG tablet Take 1 tablet (81 mg total) by mouth daily. Swallow whole. 180 tablet 1   atorvastatin  (LIPITOR) 10 MG tablet Take 10 mg by mouth daily.     DULoxetine  (CYMBALTA ) 60 MG capsule TAKE 1 CAPSULE(60 MG) BY MOUTH DAILY 30 capsule 1   letrozole  (FEMARA ) 2.5 MG tablet Take 1 tablet (2.5 mg total) by mouth daily. 90 tablet 1   losartan -hydrochlorothiazide  (HYZAAR) 100-12.5 MG tablet TAKE 1 TABLET BY MOUTH DAILY 90 tablet 1   pregabalin  (LYRICA ) 75 MG capsule TAKE 1 CAPSULE(75 MG) BY MOUTH TWICE DAILY 60 capsule 2   sotalol  (BETAPACE ) 120 MG tablet Take 1 tablet (120 mg total) by mouth 2 (two) times daily. 60 tablet 11   spironolactone  (ALDACTONE ) 25 MG tablet Take 25 mg by mouth daily.     No current facility-administered medications for this visit.    Allergies:   Triamcinolone and Tape    Social History:   reports that she has been smoking cigarettes. She has a 30 pack-year smoking history. She has never used smokeless tobacco. She reports current alcohol use of about 2.0 standard drinks of alcohol per week. She reports that she does not use drugs.   Family History:  family history includes Cancer in her sister; Diabetes in her brother and sister; Stroke in her mother and sister.    ROS:     Review of Systems  Constitutional: Negative.   HENT: Negative.    Eyes: Negative.   Respiratory: Negative.    Cardiovascular:  Positive for chest pain.  Gastrointestinal: Negative.   Genitourinary: Negative.   Musculoskeletal: Negative.   Skin: Negative.   Neurological: Negative.   Endo/Heme/Allergies: Negative.   Psychiatric/Behavioral: Negative.    All other systems reviewed and are negative.     All other systems are reviewed and negative.    PHYSICAL EXAM: VS:  BP 121/78   Pulse (!) 124   Ht 5' 6 (1.676 m)   Wt 194 lb (88 kg)   LMP 01/05/2017   SpO2 97%   BMI 31.31 kg/m  , BMI Body mass index is 31.31 kg/m. Last weight:  Wt Readings from Last 3  Encounters:  04/26/24 194 lb (88 kg)  02/24/24 195 lb (88.5 kg)  01/18/24 198 lb 8 oz (90 kg)     Physical Exam Constitutional:      Appearance: Normal appearance.   Cardiovascular:     Rate and Rhythm: Normal rate and regular rhythm.     Heart sounds: Normal heart sounds.  Pulmonary:     Effort: Pulmonary effort is normal.     Breath sounds: Normal breath sounds.   Musculoskeletal:     Right lower leg: No edema.     Left lower leg: No edema.   Neurological:     Mental Status: She is alert.  EKG:   Recent Labs: 07/05/2023: ALT 19; BUN 15; Creatinine, Ser 0.72; Potassium 4.0; Sodium 135    Lipid Panel    Component Value Date/Time   CHOL 161 01/29/2021 0827   TRIG 119 01/29/2021 0827   HDL 42 01/29/2021 0827   CHOLHDL 3.8 01/29/2021 0827   VLDL 24 01/29/2021 0827   LDLCALC 95 01/29/2021 0827      Other studies Reviewed: Additional studies/ records that were reviewed today include:  Review of the above records demonstrates:       No data to display            ASSESSMENT AND PLAN:    ICD-10-CM   1. Other chest pain  R07.89 MYOCARDIAL PERFUSION IMAGING   Has occasional chest pain, sharp and had 60% disease in RCA in past, stress test now.    2. SOB (shortness of breath)  R06.02 MYOCARDIAL PERFUSION IMAGING    3. Primary hypertension  I10 MYOCARDIAL PERFUSION IMAGING    4. Coronary artery disease involving native coronary artery of native heart without angina pectoris  I25.10 MYOCARDIAL PERFUSION IMAGING    5. Mixed hyperlipidemia  E78.2 MYOCARDIAL PERFUSION IMAGING    6. Tobacco use  Z72.0 MYOCARDIAL PERFUSION IMAGING    7. SVT (supraventricular tachycardia) (HCC)  I47.10 MYOCARDIAL PERFUSION IMAGING   on sotolol 120 daily. Doing well.       Problem List Items Addressed This Visit       Cardiovascular and Mediastinum   Primary hypertension (Chronic)   Relevant Orders   MYOCARDIAL PERFUSION IMAGING   Coronary artery disease involving  native coronary artery of native heart without angina pectoris   Relevant Orders   MYOCARDIAL PERFUSION IMAGING     Other   Tobacco use (Chronic)   Relevant Orders   MYOCARDIAL PERFUSION IMAGING   Mixed hyperlipidemia   Relevant Orders   MYOCARDIAL PERFUSION IMAGING   Other Visit Diagnoses       Other chest pain    -  Primary   Has occasional chest pain, sharp and had 60% disease in RCA in past, stress test now.   Relevant Orders   MYOCARDIAL PERFUSION IMAGING     SOB (shortness of breath)       Relevant Orders   MYOCARDIAL PERFUSION IMAGING     SVT (supraventricular tachycardia) (HCC)       on sotolol 120 daily. Doing well.   Relevant Orders   MYOCARDIAL PERFUSION IMAGING          Disposition:   Return in about 2 weeks (around 05/10/2024) for stress test and f/u.    Total time spent: 30 minutes  Signed,  Denyse Bathe, MD  04/26/2024 3:42 PM    [facility]

## 2024-05-11 ENCOUNTER — Other Ambulatory Visit: Payer: Self-pay | Admitting: Oncology

## 2024-05-11 ENCOUNTER — Other Ambulatory Visit: Payer: Self-pay | Admitting: Cardiovascular Disease

## 2024-05-14 ENCOUNTER — Encounter: Payer: Self-pay | Admitting: Oncology

## 2024-05-21 ENCOUNTER — Ambulatory Visit (INDEPENDENT_AMBULATORY_CARE_PROVIDER_SITE_OTHER)

## 2024-05-21 DIAGNOSIS — R0789 Other chest pain: Secondary | ICD-10-CM

## 2024-05-21 DIAGNOSIS — Z720 Tobacco use: Secondary | ICD-10-CM

## 2024-05-21 DIAGNOSIS — I2510 Atherosclerotic heart disease of native coronary artery without angina pectoris: Secondary | ICD-10-CM | POA: Diagnosis not present

## 2024-05-21 DIAGNOSIS — R0602 Shortness of breath: Secondary | ICD-10-CM

## 2024-05-21 DIAGNOSIS — I471 Supraventricular tachycardia, unspecified: Secondary | ICD-10-CM

## 2024-05-21 DIAGNOSIS — I10 Essential (primary) hypertension: Secondary | ICD-10-CM

## 2024-05-21 DIAGNOSIS — E782 Mixed hyperlipidemia: Secondary | ICD-10-CM

## 2024-05-21 MED ORDER — TECHNETIUM TC 99M SESTAMIBI GENERIC - CARDIOLITE
10.0000 | Freq: Once | INTRAVENOUS | Status: AC | PRN
  Administered 2024-05-21: 10 via INTRAVENOUS

## 2024-05-21 MED ORDER — TECHNETIUM TC 99M SESTAMIBI GENERIC - CARDIOLITE
30.0000 | Freq: Once | INTRAVENOUS | Status: AC | PRN
  Administered 2024-05-21: 30 via INTRAVENOUS

## 2024-06-04 ENCOUNTER — Ambulatory Visit: Admitting: Cardiovascular Disease

## 2024-06-05 ENCOUNTER — Other Ambulatory Visit: Payer: Self-pay | Admitting: Oncology

## 2024-06-05 ENCOUNTER — Other Ambulatory Visit: Payer: Self-pay | Admitting: Cardiology

## 2024-06-06 ENCOUNTER — Encounter: Payer: Self-pay | Admitting: Oncology

## 2024-06-07 ENCOUNTER — Emergency Department
Admission: EM | Admit: 2024-06-07 | Discharge: 2024-06-07 | Disposition: A | Discharge status: Home / Self Care | Attending: Emergency Medicine | Admitting: Emergency Medicine

## 2024-06-07 ENCOUNTER — Other Ambulatory Visit: Payer: Self-pay

## 2024-06-07 ENCOUNTER — Encounter: Payer: Self-pay | Admitting: Oncology

## 2024-06-07 DIAGNOSIS — I10 Essential (primary) hypertension: Secondary | ICD-10-CM | POA: Insufficient documentation

## 2024-06-07 DIAGNOSIS — R002 Palpitations: Secondary | ICD-10-CM | POA: Diagnosis not present

## 2024-06-07 DIAGNOSIS — R42 Dizziness and giddiness: Secondary | ICD-10-CM | POA: Insufficient documentation

## 2024-06-07 DIAGNOSIS — F17200 Nicotine dependence, unspecified, uncomplicated: Secondary | ICD-10-CM | POA: Insufficient documentation

## 2024-06-07 DIAGNOSIS — Z853 Personal history of malignant neoplasm of breast: Secondary | ICD-10-CM | POA: Insufficient documentation

## 2024-06-07 DIAGNOSIS — R55 Syncope and collapse: Secondary | ICD-10-CM | POA: Diagnosis present

## 2024-06-07 LAB — COMPREHENSIVE METABOLIC PANEL WITH GFR
ALT: 13 U/L (ref 0–44)
AST: 23 U/L (ref 15–41)
Albumin: 4.1 g/dL (ref 3.5–5.0)
Alkaline Phosphatase: 61 U/L (ref 38–126)
Anion gap: 10 (ref 5–15)
BUN: 14 mg/dL (ref 8–23)
CO2: 23 mmol/L (ref 22–32)
Calcium: 10.1 mg/dL (ref 8.9–10.3)
Chloride: 103 mmol/L (ref 98–111)
Creatinine, Ser: 0.72 mg/dL (ref 0.44–1.00)
GFR, Estimated: >60 mL/min (ref >60)
Glucose, Bld: 96 mg/dL (ref 70–99)
Potassium: 4 mmol/L (ref 3.5–5.1)
Sodium: 136 mmol/L (ref 135–145)
Total Bilirubin: 0.7 mg/dL (ref 0.0–1.2)
Total Protein: 7.7 g/dL (ref 6.5–8.1)

## 2024-06-07 LAB — CBC
HCT: 42 % (ref 36.0–46.0)
Hemoglobin: 13.6 g/dL (ref 12.0–15.0)
MCH: 26.6 pg (ref 26.0–34.0)
MCHC: 32.4 g/dL (ref 30.0–36.0)
MCV: 82.2 fL (ref 80.0–100.0)
Platelets: 214 K/uL (ref 150–400)
RBC: 5.11 MIL/uL (ref 3.87–5.11)
RDW: 13.6 % (ref 11.5–15.5)
WBC: 5.7 K/uL (ref 4.0–10.5)
nRBC: 0 % (ref 0.0–0.2)

## 2024-06-07 LAB — URINALYSIS, ROUTINE W REFLEX MICROSCOPIC
Bilirubin Urine: NEGATIVE
Glucose, UA: NEGATIVE mg/dL
Hgb urine dipstick: NEGATIVE
Ketones, ur: NEGATIVE mg/dL
Leukocytes,Ua: NEGATIVE
Nitrite: NEGATIVE
Protein, ur: NEGATIVE mg/dL
Specific Gravity, Urine: 1.016 (ref 1.005–1.030)
pH: 5 (ref 5.0–8.0)

## 2024-06-07 LAB — TROPONIN I (HIGH SENSITIVITY): Troponin I (High Sensitivity): 7 ng/L (ref <18)

## 2024-06-07 NOTE — ED Provider Notes (Signed)
 [facility] Provider Note    Event Date/Time   First MD Initiated Contact with Patient 06/07/24 1126     (approximate)   History   Near Syncope   HPI  Michelle Walker is a 63 y.o. female who presents to the ED for evaluation of Near Syncope   Review of a cardiology clinic visit from June.  History of HTN, gout, breast cancer s/p left mastectomy, ethanol and tobacco abuse.  Looks like she had an outpatient stress test on 7/21, normal study, normal EF.  Also review a long-term cardiac monitor from earlier this year, January, worn for 2 weeks.  Mostly NSR, couple episodes of possible SVT with rates up to 166.  Patient presents for evaluation of a brief episode of palpitations and presyncope.  She was at work this morning, had not yet had anything to eat or drink.  She was taking her break.  Was seated for a few minutes, and upon standing developed palpitations, without pain or dyspnea, and presyncope.  She sat back down reports feeling better and symptoms have resolved and have not yet recurred.  She reports feeling well.   Physical Exam   Triage Vital Signs: ED Triage Vitals  Encounter Vitals Group     BP 06/07/24 1017 (!) 130/94     Girls Systolic BP Percentile --      Girls Diastolic BP Percentile --      Boys Systolic BP Percentile --      Boys Diastolic BP Percentile --      Pulse Rate 06/07/24 1017 98     Resp 06/07/24 1017 17     Temp 06/07/24 1017 98.4 F (36.9 C)     Temp Source 06/07/24 1017 Oral     SpO2 06/07/24 1017 97 %     Weight --      Height --      Head Circumference --      Peak Flow --      Pain Score 06/07/24 1011 0     Pain Loc --      Pain Education --      Exclude from Growth Chart --     Most recent vital signs: Vitals:   06/07/24 1017 06/07/24 1148  BP: (!) 130/94   Pulse: 98   Resp: 17   Temp: 98.4 F (36.9 C)   SpO2: 97% 97%    General: Awake, no distress.  CV:  Good peripheral perfusion.  Resp:  Normal  effort.  Abd:  No distention.  MSK:  No deformity noted.  Neuro:  No focal deficits appreciated. Other:     ED Results / Procedures / Treatments   Labs (all labs ordered are listed, but only abnormal results are displayed) Labs Reviewed  URINALYSIS, ROUTINE W REFLEX MICROSCOPIC - Abnormal; Notable for the following components:      Result Value   Color, Urine YELLOW (*)    APPearance CLEAR (*)    All other components within normal limits  COMPREHENSIVE METABOLIC PANEL WITH GFR  CBC  CBG MONITORING, ED  TROPONIN I (HIGH SENSITIVITY)    EKG Sinus tachycardia with a rate of 105 bpm.  Normal axis and intervals without clear signs of acute ischemia.  RADIOLOGY   Official radiology report(s): No results found.  PROCEDURES and INTERVENTIONS:  .1-3 Lead EKG Interpretation  Performed by: Claudene Rover, MD Authorized by: Claudene Rover, MD     Interpretation: normal     ECG rate:  94   ECG rate assessment: normal     Rhythm: sinus rhythm     Ectopy: none     Conduction: normal     Medications - No data to display   IMPRESSION / MDM / ASSESSMENT AND PLAN / ED COURSE  I reviewed the triage vital signs and the nursing notes.  Differential diagnosis includes, but is not limited to, ACS, PTX, PNA, muscle strain/spasm, PE, dissection, anxiety, pleural effusion, SVT  {Patient presents with symptoms of an acute illness or injury that is potentially life-threatening.  Patient presents after an episode of palpitations and presyncope.  Symptoms have since resolved and she looks well to me with a normal exam and she is asymptomatic.  EKG with sinus rhythm, borderline elevated rates.  Similar telemetry and she remains asymptomatic.  Normal electrolytes, CBC and UA.  Add on troponin and we maintain on telemetry without evidence of dysrhythmia.  She reports feeling hungry and thirsty, has not yet had anything today, we will provide this as we assess troponin then ambulate on telemetry  and reassess.  Possibly a course of SVT.  Clinical Course as of 06/07/24 1441  Thu Jun 07, 2024  1333 Reassessed, patient ambulating to the restroom without assistance, she reports feeling well. [DS]    Clinical Course User Index [DS] Claudene Rover, MD     FINAL CLINICAL IMPRESSION(S) / ED DIAGNOSES   Final diagnoses:  Palpitations  Dizziness     Rx / DC Orders   ED Discharge Orders     None        Note:  This document was prepared using Dragon voice recognition software and may include unintentional dictation errors.   Claudene Rover, MD 06/07/24 [phone]

## 2024-06-07 NOTE — ED Triage Notes (Signed)
 Pt to ED via [facility] from from work after near syncopal episode. Pt reports heart palpations. Pt did not have LOC. Pt reports recent episode similar. Pt with recent stress test done. Has not gotten results yet.   CBG 98 132/87 21 RR CO2 22 100% RA  100 HR

## 2024-06-07 NOTE — Discharge Instructions (Signed)
 Possibly another episode of SVT, which was captured on your long-term monitor earlier this year.    If you have persistent or worsening symptoms then please return to the ED.

## 2024-06-12 ENCOUNTER — Other Ambulatory Visit: Payer: Self-pay

## 2024-06-12 ENCOUNTER — Encounter: Payer: Self-pay | Admitting: Oncology

## 2024-06-12 ENCOUNTER — Telehealth: Payer: Self-pay

## 2024-06-12 DIAGNOSIS — Z860100 Personal history of colon polyps, unspecified: Secondary | ICD-10-CM

## 2024-06-12 MED ORDER — NA SULFATE-K SULFATE-MG SULF 17.5-3.13-1.6 GM/177ML PO SOLN: 1.0000 | Freq: Once | ORAL | 0 refills | Status: AC

## 2024-06-12 NOTE — Telephone Encounter (Signed)
 Gastroenterology Pre-Procedure Review  Request Date: 09/24/24 Requesting Physician: Dr. Jinny  PATIENT REVIEW QUESTIONS: The patient responded to the following health history questions as indicated:    1. Are you having any GI issues? no 2. Do you have a personal history of Polyps? yes (04/16/2019 Dr Jinny recommended repeat in 5 years) 3. Do you have a family history of Colon Cancer or Polyps? yes (brother colon polyps) 4. Diabetes Mellitus? no 5. Joint replacements in the past 12 months?no 6. Major health problems in the past 3 months?no 7. Any artificial heart valves, MVP, or defibrillator? Cardiac history SVT clearance    MEDICATIONS & ALLERGIES:    Patient reports the following regarding taking any anticoagulation/antiplatelet therapy:   Plavix, Coumadin, Eliquis, Xarelto, Lovenox , Pradaxa, Brilinta, or Effient? no Aspirin ? no  Patient confirms/reports the following medications:  Current Outpatient Medications  Medication Sig Dispense Refill   Na Sulfate-K Sulfate-Mg Sulfate concentrate (SUPREP) 17.5-3.13-1.6 GM/177ML SOLN Take 1 kit (354 mLs total) by mouth once for 1 dose. 354 mL 0   albuterol  (VENTOLIN  HFA) 108 (90 Base) MCG/ACT inhaler Inhale 2 puffs into the lungs every 6 (six) hours as needed for wheezing or shortness of breath. 8 g 2   allopurinol  (ZYLOPRIM ) 100 MG tablet Take 100 mg by mouth at bedtime.     amLODipine  (NORVASC ) 10 MG tablet Take 10 mg by mouth daily.     aspirin  EC 81 MG tablet Take 1 tablet (81 mg total) by mouth daily. Swallow whole. 180 tablet 1   atorvastatin  (LIPITOR) 10 MG tablet Take 10 mg by mouth daily.     DULoxetine  (CYMBALTA ) 60 MG capsule TAKE 1 CAPSULE(60 MG) BY MOUTH DAILY 30 capsule 1   letrozole  (FEMARA ) 2.5 MG tablet Take 1 tablet (2.5 mg total) by mouth daily. 90 tablet 1   losartan -hydrochlorothiazide  (HYZAAR) 100-12.5 MG tablet TAKE 1 TABLET BY MOUTH DAILY 90 tablet 1   pregabalin  (LYRICA ) 75 MG capsule TAKE 1 CAPSULE(75 MG) BY MOUTH  TWICE DAILY 60 capsule 2   sotalol  (BETAPACE ) 120 MG tablet Take 1 tablet (120 mg total) by mouth 2 (two) times daily. 60 tablet 11   spironolactone  (ALDACTONE ) 25 MG tablet TAKE 1 TABLET(25 MG) BY MOUTH DAILY 30 tablet 0   No current facility-administered medications for this visit.    Patient confirms/reports the following allergies:  Allergies  Allergen Reactions   Triamcinolone Other (See Comments)    Hypopigmentation s/p steroid injection   Tape Rash    SURGICAL TAPE AFTER BREAST BIOPSY    No orders of the defined types were placed in this encounter.   AUTHORIZATION INFORMATION Primary Insurance: 1D#: Group #:  Secondary Insurance: 1D#: Group #:  SCHEDULE INFORMATION: Date: 09/24/24 Time: Location: [facility]

## 2024-06-13 ENCOUNTER — Encounter: Payer: Self-pay | Admitting: Oncology

## 2024-06-19 ENCOUNTER — Ambulatory Visit: Admitting: Cardiovascular Disease

## 2024-06-19 ENCOUNTER — Encounter: Payer: Self-pay | Admitting: Cardiovascular Disease

## 2024-06-19 ENCOUNTER — Telehealth: Payer: Self-pay

## 2024-06-19 ENCOUNTER — Encounter: Payer: Self-pay | Admitting: Oncology

## 2024-06-19 VITALS — BP 120/80 | HR 101 | Ht 66.0 in | Wt 196.0 lb

## 2024-06-19 DIAGNOSIS — R0789 Other chest pain: Secondary | ICD-10-CM | POA: Diagnosis not present

## 2024-06-19 DIAGNOSIS — R0602 Shortness of breath: Secondary | ICD-10-CM

## 2024-06-19 DIAGNOSIS — I2510 Atherosclerotic heart disease of native coronary artery without angina pectoris: Secondary | ICD-10-CM | POA: Diagnosis not present

## 2024-06-19 DIAGNOSIS — Z720 Tobacco use: Secondary | ICD-10-CM

## 2024-06-19 DIAGNOSIS — I471 Supraventricular tachycardia, unspecified: Secondary | ICD-10-CM

## 2024-06-19 DIAGNOSIS — E782 Mixed hyperlipidemia: Secondary | ICD-10-CM | POA: Diagnosis not present

## 2024-06-19 DIAGNOSIS — Z0130 Encounter for examination of blood pressure without abnormal findings: Secondary | ICD-10-CM

## 2024-06-19 NOTE — Telephone Encounter (Signed)
 Received medical clearance from Dr.Shaukat Fernand -patient is cleared to have procedure.

## 2024-06-19 NOTE — Progress Notes (Signed)
 Cardiology Office Note   Date:  06/19/2024   ID:  Michelle Walker, DOB 27-Feb-1961, MRN [id number]  PCP:  Pcp, No  Cardiologist:  Denyse Bathe, MD      History of Present Illness: Michelle Walker is a 63 y.o. female who presents for  Chief Complaint  Patient presents with   Follow-up    2 week NST results and hospital follow up    [facility] to [facility], as had palpitation, and getting dark, and made it to chair and called for help. Feel good now.      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 Wide excision/ RT, T1a,N0. ER/PR pos, Her 2.neg. Tamoxifen  x 6 months, d/c secondary to vasomotor sympotms.    Flu 10/08/2022   Headache    HTN (hypertension) 09/20/2017   Hypertension    Hypokalemia 10/10/2022   Hypoxia 02/27/2022   Last Assessment & Plan: Formatting of this note might be different from the original. 63 year old female with history of hypertension, gout, breast cancer left mastectomy on oral hormonal therapy, smoker, alcohol user on daily basis came to the emergency room after passing out.  Fell off from the bar stool. Loss of consciousness is unknown.  Patient found to be hypoxic in the ER.  On room air sats   Migraine 09/20/2017   Multifocal pneumonia 10/08/2022   Pain in joint of right hip 09/10/2021   Peripheral neuropathy    Personal history of radiation therapy 2016   RIGHT lumpectomy   Post-operative state 04/12/2022   Respiratory failure (HCC) 10/06/2022   Thyroid  disease      Past Surgical History:  Procedure Laterality Date   ANTERIOR CRUCIATE LIGAMENT REPAIR Right    BREAST BIOPSY Right 07/17/2015   INVASIVE LOBULAR CARCINOMA, CLASSIC TYPE (1.5 MM), ARISING IN A    BREAST BIOPSY Left 09/19/2020   us  bx 3 areas/ 1oc q clip/ 12 oc vision clip, axilla lymph node hydromark shape 3/ path pending   BREAST LUMPECTOMY Right 08/22/2015   BREAST LUMPECTOMY  WITH SENTINEL LYMPH NODE BIOPSY Right 08/22/2015   Procedure: BREAST LUMPECTOMY WITH SENTINEL LYMPH NODE BX;  Surgeon: Louanne KANDICE Muse, MD;  Location: [facility];  Service: General;  Laterality: Right;   COLONOSCOPY W/ POLYPECTOMY  2014   COLONOSCOPY WITH PROPOFOL  N/A 04/16/2019   Procedure: COLONOSCOPY WITH BIOPSIES;  Surgeon: Jinny Carmine, MD;  Location: [facility];  Service: Endoscopy;  Laterality: N/A;   DILATION AND CURETTAGE OF UTERUS     20 years ago   LAPAROSCOPIC VAGINAL HYSTERECTOMY WITH SALPINGO OOPHORECTOMY Bilateral 04/12/2022   Procedure: LAPAROSCOPIC ASSISTED VAGINAL HYSTERECTOMY WITH SALPINGO OOPHORECTOMY;  Surgeon: Janit Alm Agent, MD;  Location: [facility];  Service: Gynecology;  Laterality: Bilateral;   MASTECTOMY Left    2021   NOVASURE ABLATION     POLYPECTOMY N/A 04/16/2019   Procedure: POLYPECTOMY;  Surgeon: Jinny Carmine, MD;  Location: [facility];  Service: Endoscopy;  Laterality: N/A;   PORTACATH PLACEMENT Right 12/01/2020   Procedure: INSERTION PORT-A-CATH;  Surgeon: Lane Shope, MD;  Location: [facility];  Service: General;  Laterality: Right;   TOTAL MASTECTOMY Left 05/06/2021   Procedure: modified radical mastectomy;  Surgeon: Lane Shope, MD;  Location: [facility];  Service: General;  Laterality: Left;  Provider requesting 1.5 hours / 90 minutes for procedure     Current Outpatient  Medications  Medication Sig Dispense Refill   albuterol  (VENTOLIN  HFA) 108 (90 Base) MCG/ACT inhaler Inhale 2 puffs into the lungs every 6 (six) hours as needed for wheezing or shortness of breath. 8 g 2   allopurinol  (ZYLOPRIM ) 100 MG tablet Take 100 mg by mouth at bedtime.     amLODipine  (NORVASC ) 10 MG tablet Take 10 mg by mouth daily.     aspirin  EC 81 MG tablet Take 1 tablet (81 mg total) by mouth daily. Swallow whole. 180 tablet 1   atorvastatin  (LIPITOR) 10 MG tablet Take 10 mg by mouth daily.     DULoxetine  (CYMBALTA ) 60 MG capsule TAKE 1  CAPSULE(60 MG) BY MOUTH DAILY 30 capsule 1   letrozole  (FEMARA ) 2.5 MG tablet Take 1 tablet (2.5 mg total) by mouth daily. 90 tablet 1   losartan -hydrochlorothiazide  (HYZAAR) 100-12.5 MG tablet TAKE 1 TABLET BY MOUTH DAILY 90 tablet 1   metoprolol  succinate (TOPROL -XL) 100 MG 24 hr tablet Take 100 mg by mouth.     pregabalin  (LYRICA ) 75 MG capsule TAKE 1 CAPSULE(75 MG) BY MOUTH TWICE DAILY 60 capsule 2   sotalol  (BETAPACE ) 120 MG tablet Take 1 tablet (120 mg total) by mouth 2 (two) times daily. 60 tablet 11   spironolactone  (ALDACTONE ) 25 MG tablet TAKE 1 TABLET(25 MG) BY MOUTH DAILY 30 tablet 0   No current facility-administered medications for this visit.    Allergies:   Triamcinolone and Tape    Social History:   reports that she has been smoking cigarettes. She has a 30 pack-year smoking history. She has never used smokeless tobacco. She reports current alcohol use of about 2.0 standard drinks of alcohol per week. She reports that she does not use drugs.   Family History:  family history includes Cancer in her sister; Diabetes in her brother and sister; Stroke in her mother and sister.    ROS:     Review of Systems  Constitutional: Negative.   HENT: Negative.    Eyes: Negative.   Respiratory: Negative.    Gastrointestinal: Negative.   Genitourinary: Negative.   Musculoskeletal: Negative.   Skin: Negative.   Neurological: Negative.   Endo/Heme/Allergies: Negative.   Psychiatric/Behavioral: Negative.    All other systems reviewed and are negative.     All other systems are reviewed and negative.    PHYSICAL EXAM: VS:  BP 120/80   Pulse (!) 101   Ht 5' 6 (1.676 m)   Wt 196 lb (88.9 kg)   LMP 01/05/2017   SpO2 99%   BMI 31.64 kg/m  , BMI Body mass index is 31.64 kg/m. Last weight:  Wt Readings from Last 3 Encounters:  06/19/24 196 lb (88.9 kg)  04/26/24 194 lb (88 kg)  02/24/24 195 lb (88.5 kg)     Physical Exam Constitutional:      Appearance: Normal  appearance.  Cardiovascular:     Rate and Rhythm: Normal rate and regular rhythm.     Heart sounds: Normal heart sounds.  Pulmonary:     Effort: Pulmonary effort is normal.     Breath sounds: Normal breath sounds.  Musculoskeletal:     Right lower leg: No edema.     Left lower leg: No edema.  Neurological:     Mental Status: She is alert.       EKG:   Recent Labs: 06/07/2024: ALT 13; BUN 14; Creatinine, Ser 0.72; Hemoglobin 13.6; Platelets 214; Potassium 4.0; Sodium 136    Lipid Panel  Component Value Date/Time   CHOL 161 01/29/2021 0827   TRIG 119 01/29/2021 0827   HDL 42 01/29/2021 0827   CHOLHDL 3.8 01/29/2021 0827   VLDL 24 01/29/2021 0827   LDLCALC 95 01/29/2021 0827      Other studies Reviewed: Additional studies/ records that were reviewed today include:  Review of the above records demonstrates:       No data to display            ASSESSMENT AND PLAN:    ICD-10-CM   1. SVT (supraventricular tachycardia) (HCC)  I47.10    Probably had episode of SVT as had recovered to sinus tachycardia 105/min with no acute changes. May be petite mall seizure also. Consider neurolgy consult.    2. Tobacco use  Z72.0     3. Mixed hyperlipidemia  E78.2     4. Coronary artery disease involving native coronary artery of native heart without angina pectoris  I25.10     5. SOB (shortness of breath)  R06.02     6. Other chest pain  R07.89    Stress test was normal, normal LVEF. Holter had SVT up to 160/min on sotolol 80 bid.Take extra dose sotolol PRN if palpitation.       Problem List Items Addressed This Visit       Cardiovascular and Mediastinum   Coronary artery disease involving native coronary artery of native heart without angina pectoris   Relevant Medications   metoprolol  succinate (TOPROL -XL) 100 MG 24 hr tablet     Other   Tobacco use (Chronic)   Mixed hyperlipidemia   Relevant Medications   metoprolol  succinate (TOPROL -XL) 100 MG 24 hr tablet    Other Visit Diagnoses       SVT (supraventricular tachycardia) (HCC)    -  Primary   Probably had episode of SVT as had recovered to sinus tachycardia 105/min with no acute changes. May be petite mall seizure also. Consider neurolgy consult.   Relevant Medications   metoprolol  succinate (TOPROL -XL) 100 MG 24 hr tablet     SOB (shortness of breath)         Other chest pain       Stress test was normal, normal LVEF. Holter had SVT up to 160/min on sotolol 80 bid.Take extra dose sotolol PRN if palpitation.          Disposition:   Return in about 2 months (around 08/19/2024).    Total time spent: 30 minutes  Signed,  Denyse Bathe, MD  06/19/2024 3:53 PM    [facility]

## 2024-06-22 ENCOUNTER — Encounter: Payer: Self-pay | Admitting: Oncology

## 2024-06-22 ENCOUNTER — Other Ambulatory Visit: Payer: Self-pay | Admitting: Sports Medicine

## 2024-06-22 DIAGNOSIS — M76891 Other specified enthesopathies of right lower limb, excluding foot: Secondary | ICD-10-CM

## 2024-06-22 DIAGNOSIS — M25551 Pain in right hip: Secondary | ICD-10-CM

## 2024-06-25 ENCOUNTER — Ambulatory Visit: Admitting: Internal Medicine

## 2024-06-25 ENCOUNTER — Ambulatory Visit: Payer: Self-pay | Admitting: Internal Medicine

## 2024-06-25 ENCOUNTER — Encounter: Payer: Self-pay | Admitting: Internal Medicine

## 2024-06-25 ENCOUNTER — Ambulatory Visit
Admission: RE | Admit: 2024-06-25 | Discharge: 2024-06-25 | Disposition: A | Discharge status: Home / Self Care | Source: Ambulatory Visit | Attending: Sports Medicine | Admitting: Sports Medicine

## 2024-06-25 VITALS — BP 118/76 | HR 105 | Ht 66.0 in | Wt 197.8 lb

## 2024-06-25 DIAGNOSIS — E1100 Type 2 diabetes mellitus with hyperosmolarity without nonketotic hyperglycemic-hyperosmolar coma (NKHHC): Secondary | ICD-10-CM

## 2024-06-25 DIAGNOSIS — F17210 Nicotine dependence, cigarettes, uncomplicated: Secondary | ICD-10-CM | POA: Insufficient documentation

## 2024-06-25 DIAGNOSIS — E1169 Type 2 diabetes mellitus with other specified complication: Secondary | ICD-10-CM | POA: Insufficient documentation

## 2024-06-25 DIAGNOSIS — I10 Essential (primary) hypertension: Secondary | ICD-10-CM

## 2024-06-25 DIAGNOSIS — R000 Tachycardia, unspecified: Secondary | ICD-10-CM

## 2024-06-25 DIAGNOSIS — M76891 Other specified enthesopathies of right lower limb, excluding foot: Secondary | ICD-10-CM | POA: Insufficient documentation

## 2024-06-25 DIAGNOSIS — M76892 Other specified enthesopathies of left lower limb, excluding foot: Secondary | ICD-10-CM | POA: Insufficient documentation

## 2024-06-25 DIAGNOSIS — E0590 Thyrotoxicosis, unspecified without thyrotoxic crisis or storm: Secondary | ICD-10-CM

## 2024-06-25 DIAGNOSIS — Z860100 Personal history of colon polyps, unspecified: Secondary | ICD-10-CM

## 2024-06-25 DIAGNOSIS — E782 Mixed hyperlipidemia: Secondary | ICD-10-CM

## 2024-06-25 DIAGNOSIS — F32A Depression, unspecified: Secondary | ICD-10-CM

## 2024-06-25 DIAGNOSIS — M1A09X0 Idiopathic chronic gout, multiple sites, without tophus (tophi): Secondary | ICD-10-CM

## 2024-06-25 DIAGNOSIS — M25551 Pain in right hip: Secondary | ICD-10-CM | POA: Insufficient documentation

## 2024-06-25 LAB — POC CREATINE & ALBUMIN,URINE
Albumin/Creatinine Ratio, Urine, POC: <30
Creatinine, POC: 300 mg/dL
Microalbumin Ur, POC: 80 mg/L

## 2024-06-25 NOTE — Progress Notes (Signed)
 Established Patient Office Visit  Subjective:  Patient ID: ASHER BABILONIA, female    DOB: August 30, 1961  Age: 63 y.o. MRN: [id number]  Chief Complaint  Patient presents with   Establish Care    NPE    Patient is a new patient with us  but already sees Cardiology. Overall, she reports she feels well and has no issues with her current medications. She works at a cigarette factory and smokes 1 ppd. She has not had a low dose chest CT for lung cancer screening. Patient has history of breast cancer twice and colon polyps. She has a history of hypothyroidism but has not been on medication for awhile. Her last TSH was 0.014. Will recheck with upcoming blood work. She has a history of left lobe enlarged thyroid . Denies any pain with swallowing, cough, difficulty breathing.  Patient has history of gout in her R great toe, she has not had any recent gout attacks but reports she takes her Allopurinol  daily.   Patient received radiation, chemotherapy for her breast cancer and had left breast removal surgery. Since her chemotherapy and radiation she has had neuropathy in both lower extremities. She reports the right is worse than the left. She is prescribed Lyrica  but does not think it has made her symptoms improve.  Patient also follows orthopedics and is scheduled later today for an MRI to assess her bilateral hip pain. She is due for her DEXA scan and will discuss scheduling that at her follow up appointment.    No other concerns at this time.   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 Wide excision/ RT, T1a,N0. ER/PR pos, Her 2.neg. Tamoxifen  x 6 months, d/c secondary to vasomotor sympotms.    Flu 10/08/2022   Headache    HTN (hypertension) 09/20/2017   Hypertension    Hypokalemia 10/10/2022   Hypoxia 02/27/2022   Last Assessment & Plan: Formatting of this note might be different from  the original. 63 year old female with history of hypertension, gout, breast cancer left mastectomy on oral hormonal therapy, smoker, alcohol user on daily basis came to the emergency room after passing out.  Fell off from the bar stool. Loss of consciousness is unknown.  Patient found to be hypoxic in the ER.  On room air sats   Migraine 09/20/2017   Multifocal pneumonia 10/08/2022   Pain in joint of right hip 09/10/2021   Peripheral neuropathy    Personal history of radiation therapy 2016   RIGHT lumpectomy   Post-operative state 04/12/2022   Respiratory failure (HCC) 10/06/2022   Thyroid  disease     Past Surgical History:  Procedure Laterality Date   ANTERIOR CRUCIATE LIGAMENT REPAIR Right    BREAST BIOPSY Right 07/17/2015   INVASIVE LOBULAR CARCINOMA, CLASSIC TYPE (1.5 MM), ARISING IN A    BREAST BIOPSY Left 09/19/2020   us  bx 3 areas/ 1oc q clip/ 12 oc vision clip, axilla lymph node hydromark shape 3/ path pending   BREAST LUMPECTOMY Right 08/22/2015   BREAST LUMPECTOMY WITH SENTINEL LYMPH NODE BIOPSY Right 08/22/2015   Procedure: BREAST LUMPECTOMY WITH SENTINEL LYMPH NODE BX;  Surgeon: Louanne KANDICE Muse, MD;  Location: [facility];  Service: General;  Laterality: Right;   COLONOSCOPY W/ POLYPECTOMY  2014   COLONOSCOPY WITH PROPOFOL  N/A 04/16/2019   Procedure: COLONOSCOPY WITH BIOPSIES;  Surgeon: Jinny Carmine, MD;  Location: [facility];  Service: Endoscopy;  Laterality: N/A;   DILATION AND CURETTAGE OF UTERUS     20 years ago   LAPAROSCOPIC VAGINAL HYSTERECTOMY WITH SALPINGO OOPHORECTOMY Bilateral 04/12/2022   Procedure: LAPAROSCOPIC ASSISTED VAGINAL HYSTERECTOMY WITH SALPINGO OOPHORECTOMY;  Surgeon: Janit Alm Agent, MD;  Location: [facility];  Service: Gynecology;  Laterality: Bilateral;   MASTECTOMY Left    2021   NOVASURE ABLATION     POLYPECTOMY N/A 04/16/2019   Procedure: POLYPECTOMY;  Surgeon: Jinny Carmine, MD;  Location: [facility];  Service: Endoscopy;   Laterality: N/A;   PORTACATH PLACEMENT Right 12/01/2020   Procedure: INSERTION PORT-A-CATH;  Surgeon: Lane Shope, MD;  Location: [facility];  Service: General;  Laterality: Right;   TOTAL MASTECTOMY Left 05/06/2021   Procedure: modified radical mastectomy;  Surgeon: Lane Shope, MD;  Location: [facility] OR[facility];  Service: General;  Laterality: Left;  Provider requesting 1.5 hours / 90 minutes for procedure    Social History   Socioeconomic History   Marital status: Married    Spouse name: Vick   Number of children: 1   Years of education: Not on file   Highest education level: Not on file  Occupational History   Not on file  Tobacco Use   Smoking status: Every Day    Current packs/day: 0.75    Average packs/day: 0.8 packs/day for 40.0 years (30.0 ttl pk-yrs)    Types: Cigarettes   Smokeless tobacco: Never  Vaping Use   Vaping status: Never Used  Substance and Sexual Activity   Alcohol use: Yes    Alcohol/week: 2.0 standard drinks of alcohol    Types: 2 Standard drinks or equivalent per week    Comment: BEER 1-2 QD   Drug use: No   Sexual activity: Yes    Birth control/protection: Surgical    Comment: Hysterectomy  Other Topics Concern   Not on file  Social History Narrative   Daughter and 3 kids in home, her Brother is in the home as well.   Social Drivers of Corporate investment banker Strain: Not on file  Food Insecurity: Not on file  Transportation Needs: Not on file  Physical Activity: Not on file  Stress: Not on file  Social Connections: Not on file  Intimate Partner Violence: Not on file    Family History  Problem Relation Age of Onset   Stroke Mother    Cancer Sister        sarcoma on arm; chemo   Diabetes Sister    Stroke Sister    Diabetes Brother    Breast cancer Neg Hx    Ovarian cancer Neg Hx    Colon cancer Neg Hx    Heart disease Neg Hx     Allergies  Allergen Reactions   Triamcinolone Other (See Comments)    Hypopigmentation s/p  steroid injection   Tape Rash    SURGICAL TAPE AFTER BREAST BIOPSY    Outpatient Medications Prior to Visit  Medication Sig   albuterol  (VENTOLIN  HFA) 108 (90 Base) MCG/ACT inhaler Inhale 2 puffs into the lungs every 6 (six) hours as needed for wheezing or shortness of breath.   allopurinol  (ZYLOPRIM ) 100 MG tablet Take 100 mg by mouth at bedtime.   amLODipine  (NORVASC ) 10 MG tablet Take 10 mg by mouth daily.   aspirin  EC 81 MG tablet Take 1 tablet (81 mg total) by mouth daily. Swallow whole.   atorvastatin  (LIPITOR) 10 MG tablet Take 10 mg by mouth daily.  DULoxetine  (CYMBALTA ) 60 MG capsule TAKE 1 CAPSULE(60 MG) BY MOUTH DAILY   letrozole  (FEMARA ) 2.5 MG tablet Take 1 tablet (2.5 mg total) by mouth daily.   losartan -hydrochlorothiazide  (HYZAAR) 100-12.5 MG tablet TAKE 1 TABLET BY MOUTH DAILY   pregabalin  (LYRICA ) 75 MG capsule TAKE 1 CAPSULE(75 MG) BY MOUTH TWICE DAILY   sotalol  (BETAPACE ) 120 MG tablet Take 1 tablet (120 mg total) by mouth 2 (two) times daily.   spironolactone  (ALDACTONE ) 25 MG tablet TAKE 1 TABLET(25 MG) BY MOUTH DAILY   [DISCONTINUED] metoprolol  succinate (TOPROL -XL) 100 MG 24 hr tablet Take 100 mg by mouth.   No facility-administered medications prior to visit.    Review of Systems  Constitutional: Negative.   HENT: Negative.    Eyes: Negative.   Respiratory: Negative.  Negative for cough and shortness of breath.   Cardiovascular: Negative.  Negative for chest pain, palpitations and leg swelling.  Gastrointestinal: Negative.  Negative for abdominal pain, constipation, diarrhea, heartburn, nausea and vomiting.  Genitourinary: Negative.  Negative for dysuria and flank pain.  Musculoskeletal:  Positive for joint pain. Negative for myalgias.  Skin: Negative.   Neurological: Negative.  Negative for dizziness and headaches.  Endo/Heme/Allergies: Negative.   Psychiatric/Behavioral: Negative.  Negative for depression and suicidal ideas. The patient is not  nervous/anxious.        Objective:   BP 118/76   Pulse (!) 105   Ht 5' 6 (1.676 m)   Wt 197 lb 12.8 oz (89.7 kg)   LMP 01/05/2017   SpO2 98%   BMI 31.93 kg/m   Vitals:   06/25/24 1301  BP: 118/76  Pulse: (!) 105  Height: 5' 6 (1.676 m)  Weight: 197 lb 12.8 oz (89.7 kg)  SpO2: 98%  BMI (Calculated): 31.94    Physical Exam Vitals and nursing note reviewed.  Constitutional:      Appearance: Normal appearance.  HENT:     Head: Normocephalic and atraumatic.     Nose: Nose normal.     Mouth/Throat:     Mouth: Mucous membranes are moist.     Pharynx: Oropharynx is clear.  Eyes:     Conjunctiva/sclera: Conjunctivae normal.     Pupils: Pupils are equal, round, and reactive to light.  Neck:     Thyroid : Thyromegaly (left lobe) present.     Comments: Patient reports nontender Cardiovascular:     Rate and Rhythm: Normal rate and regular rhythm.     Pulses: Normal pulses.     Heart sounds: Normal heart sounds. No murmur heard. Pulmonary:     Effort: Pulmonary effort is normal.     Breath sounds: Normal breath sounds. No wheezing.  Abdominal:     General: Bowel sounds are normal.     Palpations: Abdomen is soft.     Tenderness: There is no abdominal tenderness. There is no right CVA tenderness or left CVA tenderness.  Musculoskeletal:        General: Normal range of motion.     Cervical back: Normal range of motion.     Right lower leg: No edema.     Left lower leg: No edema.  Skin:    General: Skin is warm and dry.  Neurological:     General: No focal deficit present.     Mental Status: She is alert and oriented to person, place, and time.  Psychiatric:        Mood and Affect: Mood normal.        Behavior: Behavior normal.  Results for orders placed or performed in visit on 06/25/24  POC CREATINE & ALBUMIN,URINE  Result Value Ref Range   Microalbumin Ur, POC 80 mg/L   Creatinine, POC 300 mg/dL   Albumin/Creatinine Ratio, Urine, POC <30     Recent  Results (from the past 2160 hours)  Urinalysis, Routine w reflex microscopic -Urine, Clean Catch     Status: Abnormal   Collection Time: 06/07/24 10:11 AM  Result Value Ref Range   Color, Urine YELLOW (A) YELLOW   APPearance CLEAR (A) CLEAR   Specific Gravity, Urine 1.016 1.005 - 1.030   pH 5.0 5.0 - 8.0   Glucose, UA NEGATIVE NEGATIVE mg/dL   Hgb urine dipstick NEGATIVE NEGATIVE   Bilirubin Urine NEGATIVE NEGATIVE   Ketones, ur NEGATIVE NEGATIVE mg/dL   Protein, ur NEGATIVE NEGATIVE mg/dL   Nitrite NEGATIVE NEGATIVE   Leukocytes,Ua NEGATIVE NEGATIVE    Comment: Performed at [facility], [address] [place], [place] [address]  Comprehensive metabolic panel     Status: None   Collection Time: 06/07/24 11:32 AM  Result Value Ref Range   Sodium 136 135 - 145 mmol/L   Potassium 4.0 3.5 - 5.1 mmol/L   Chloride 103 98 - 111 mmol/L   CO2 23 22 - 32 mmol/L   Glucose, Bld 96 70 - 99 mg/dL    Comment: Glucose reference range applies only to samples taken after fasting for at least 8 hours.   BUN 14 8 - 23 mg/dL   Creatinine, Ser 9.27 0.44 - 1.00 mg/dL   Calcium  10.1 8.9 - 10.3 mg/dL   Total Protein 7.7 6.5 - 8.1 g/dL   Albumin 4.1 3.5 - 5.0 g/dL   AST 23 15 - 41 U/L   ALT 13 0 - 44 U/L   Alkaline Phosphatase 61 38 - 126 U/L   Total Bilirubin 0.7 0.0 - 1.2 mg/dL   GFR, Estimated >39 >39 mL/min    Comment: (NOTE) Calculated using the CKD-EPI Creatinine Equation (2021)    Anion gap 10 5 - 15    Comment: Performed at [facility], [address] [place], [place] [address]  CBC     Status: None   Collection Time: 06/07/24 11:32 AM  Result Value Ref Range   WBC 5.7 4.0 - 10.5 K/uL   RBC 5.11 3.87 - 5.11 MIL/uL   Hemoglobin 13.6 12.0 - 15.0 g/dL   HCT 57.9 63.9 - 53.9 %   MCV 82.2 80.0 - 100.0 fL   MCH 26.6 26.0 - 34.0 pg   MCHC 32.4 30.0 - 36.0 g/dL   RDW 86.3 88.4 - 84.4 %   Platelets 214 150 - 400 K/uL   nRBC 0.0 0.0 - 0.2 %    Comment: Performed at  [facility] [address], [place], [place] [address]  Troponin I (High Sensitivity)     Status: None   Collection Time: 06/07/24 11:32 AM  Result Value Ref Range   Troponin I (High Sensitivity) 7 <18 ng/L    Comment: (NOTE) Elevated high sensitivity troponin I (hsTnI) values and significant  changes across serial measurements may suggest ACS but many other  chronic and acute conditions are known to elevate hsTnI results.  Refer to the Links section for chest pain algorithms and additional  guidance. Performed at [facility], [address] [place], [place] [address]   POC CREATINE & LIA MEND     Status: Normal   Collection Time: 06/25/24  3:30  PM  Result Value Ref Range   Microalbumin Ur, POC 80 mg/L   Creatinine, POC 300 mg/dL   Albumin/Creatinine Ratio, Urine, POC <30       Assessment & Plan:  Continue taking medications as prescribed. Will order routine labs to be collected in about 1 month. Patient current long term smoker, will order low dose chest CT for lung cancer screening. Will discuss ordering DEXA scan that is due when she returns for her follow up. Problem List Items Addressed This Visit     Depression   DM2 (diabetes mellitus, type 2) (HCC) - Primary   Relevant Orders   Hemoglobin A1c   Microalbumin / Creatinine Urine Ratio   POC CREATINE & ALBUMIN,URINE (Completed)   History of colonic polyps   Tachycardia   Relevant Orders   TSH+T4F+T3Free   Gout of multiple sites   Relevant Orders   Uric acid   Primary hypertension (Chronic)   Relevant Orders   CBC with Diff   CMP14+EGFR   Combined hyperlipidemia associated with type 2 diabetes mellitus (HCC)   Relevant Orders   Lipid Panel w/o Chol/HDL Ratio   Tobacco smoker, 1 pack of cigarettes or less per day   Relevant Orders   CT CHEST LUNG CANCER SCREENING LOW DOSE WO CONTRAST    Return in about 3 months (around 09/25/2024).   Total time spent: 30 minutes  FERNAND FREDY RAMAN,  MD  06/25/2024   This document may have been prepared by [facility] Voice Recognition software and as such may include unintentional dictation errors.

## 2024-06-27 ENCOUNTER — Other Ambulatory Visit: Payer: Self-pay | Admitting: Oncology

## 2024-07-03 ENCOUNTER — Inpatient Hospital Stay: Attending: Oncology | Admitting: Oncology

## 2024-07-03 ENCOUNTER — Encounter: Payer: Self-pay | Admitting: Oncology

## 2024-07-03 ENCOUNTER — Other Ambulatory Visit: Payer: Self-pay | Admitting: Cardiology

## 2024-07-12 ENCOUNTER — Other Ambulatory Visit: Payer: Self-pay | Admitting: Sports Medicine

## 2024-07-12 DIAGNOSIS — M51372 Other intervertebral disc degeneration, lumbosacral region with discogenic back pain and lower extremity pain: Secondary | ICD-10-CM

## 2024-07-12 DIAGNOSIS — G8929 Other chronic pain: Secondary | ICD-10-CM

## 2024-07-12 DIAGNOSIS — M461 Sacroiliitis, not elsewhere classified: Secondary | ICD-10-CM

## 2024-07-12 DIAGNOSIS — M5117 Intervertebral disc disorders with radiculopathy, lumbosacral region: Secondary | ICD-10-CM

## 2024-07-12 DIAGNOSIS — M47816 Spondylosis without myelopathy or radiculopathy, lumbar region: Secondary | ICD-10-CM

## 2024-07-21 ENCOUNTER — Ambulatory Visit
Admission: RE | Admit: 2024-07-21 | Discharge: 2024-07-21 | Disposition: A | Discharge status: Home / Self Care | Source: Ambulatory Visit | Attending: Sports Medicine

## 2024-07-21 DIAGNOSIS — M5117 Intervertebral disc disorders with radiculopathy, lumbosacral region: Secondary | ICD-10-CM | POA: Insufficient documentation

## 2024-07-21 DIAGNOSIS — M51372 Other intervertebral disc degeneration, lumbosacral region with discogenic back pain and lower extremity pain: Secondary | ICD-10-CM | POA: Diagnosis present

## 2024-07-21 DIAGNOSIS — M47816 Spondylosis without myelopathy or radiculopathy, lumbar region: Secondary | ICD-10-CM | POA: Insufficient documentation

## 2024-07-21 DIAGNOSIS — M5441 Lumbago with sciatica, right side: Secondary | ICD-10-CM | POA: Insufficient documentation

## 2024-07-21 DIAGNOSIS — M461 Sacroiliitis, not elsewhere classified: Secondary | ICD-10-CM | POA: Insufficient documentation

## 2024-07-21 DIAGNOSIS — M5442 Lumbago with sciatica, left side: Secondary | ICD-10-CM | POA: Insufficient documentation

## 2024-07-21 DIAGNOSIS — G8929 Other chronic pain: Secondary | ICD-10-CM | POA: Diagnosis present

## 2024-07-23 ENCOUNTER — Other Ambulatory Visit: Payer: Self-pay | Admitting: Oncology

## 2024-07-24 ENCOUNTER — Encounter: Payer: Self-pay | Admitting: Oncology

## 2024-07-25 ENCOUNTER — Telehealth: Payer: Self-pay | Admitting: Oncology

## 2024-07-25 NOTE — Telephone Encounter (Signed)
 Pt called to r/s missed appt from 9/2.  MD appt has been r/s to next available.

## 2024-07-28 ENCOUNTER — Other Ambulatory Visit: Payer: Self-pay | Admitting: Oncology

## 2024-07-30 ENCOUNTER — Encounter: Payer: Self-pay | Admitting: Oncology

## 2024-08-13 ENCOUNTER — Encounter: Payer: Self-pay | Admitting: Cardiovascular Disease

## 2024-08-13 ENCOUNTER — Ambulatory Visit: Admitting: Cardiovascular Disease

## 2024-08-13 VITALS — BP 104/60 | HR 98 | Ht 66.0 in | Wt 199.0 lb

## 2024-08-13 DIAGNOSIS — F17210 Nicotine dependence, cigarettes, uncomplicated: Secondary | ICD-10-CM

## 2024-08-13 DIAGNOSIS — F1090 Alcohol use, unspecified, uncomplicated: Secondary | ICD-10-CM

## 2024-08-13 DIAGNOSIS — I2510 Atherosclerotic heart disease of native coronary artery without angina pectoris: Secondary | ICD-10-CM

## 2024-08-13 DIAGNOSIS — I471 Supraventricular tachycardia, unspecified: Secondary | ICD-10-CM

## 2024-08-13 DIAGNOSIS — C773 Secondary and unspecified malignant neoplasm of axilla and upper limb lymph nodes: Secondary | ICD-10-CM

## 2024-08-13 DIAGNOSIS — C50911 Malignant neoplasm of unspecified site of right female breast: Secondary | ICD-10-CM | POA: Diagnosis not present

## 2024-08-13 DIAGNOSIS — I10 Essential (primary) hypertension: Secondary | ICD-10-CM | POA: Diagnosis not present

## 2024-08-13 DIAGNOSIS — R55 Syncope and collapse: Secondary | ICD-10-CM

## 2024-08-13 DIAGNOSIS — E782 Mixed hyperlipidemia: Secondary | ICD-10-CM

## 2024-08-13 NOTE — Progress Notes (Signed)
 Cardiology Office Note   Date:  08/13/2024   ID:  Michelle Walker, DOB [birth year], MRN [id number]  PCP:  Michelle Fredy RAMAN, MD  Cardiologist:  Michelle Fernand, MD      History of Present Illness: Michelle Walker is a 63 y.o. female who presents for  Chief Complaint  Patient presents with   Follow-up    2 months follow up    Has back pain. No palpitations as had gone to ER with SVT.      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 Wide excision/ RT, T1a,N0. ER/PR pos, Her 2.neg. Tamoxifen  x 6 months, d/c secondary to vasomotor sympotms.    Flu 10/08/2022   Headache    HTN (hypertension) 09/20/2017   Hypertension    Hypokalemia 10/10/2022   Hypoxia 02/27/2022   Last Assessment & Plan: Formatting of this note might be different from the original. 63 year old female with history of hypertension, gout, breast cancer left mastectomy on oral hormonal therapy, smoker, alcohol user on daily basis came to the emergency room after passing out.  Fell off from the bar stool. Loss of consciousness is unknown.  Patient found to be hypoxic in the ER.  On room air sats   Migraine 09/20/2017   Multifocal pneumonia 10/08/2022   Pain in joint of right hip 09/10/2021   Peripheral neuropathy    Personal history of radiation therapy 2016   RIGHT lumpectomy   Post-operative state 04/12/2022   Respiratory failure (HCC) 10/06/2022   Thyroid  disease      Past Surgical History:  Procedure Laterality Date   ANTERIOR CRUCIATE LIGAMENT REPAIR Right    BREAST BIOPSY Right 07/17/2015   INVASIVE LOBULAR CARCINOMA, CLASSIC TYPE (1.5 MM), ARISING IN A    BREAST BIOPSY Left 09/19/2020   us  bx 3 areas/ 1oc q clip/ 12 oc vision clip, axilla lymph node hydromark shape 3/ path pending   BREAST LUMPECTOMY Right 08/22/2015   BREAST LUMPECTOMY WITH SENTINEL LYMPH NODE BIOPSY Right 08/22/2015    Procedure: BREAST LUMPECTOMY WITH SENTINEL LYMPH NODE BX;  Surgeon: Michelle KANDICE Muse, MD;  Location: [facility];  Service: General;  Laterality: Right;   COLONOSCOPY W/ POLYPECTOMY  2014   COLONOSCOPY WITH PROPOFOL  N/A 04/16/2019   Procedure: COLONOSCOPY WITH BIOPSIES;  Surgeon: Michelle Carmine, MD;  Location: [facility];  Service: Endoscopy;  Laterality: N/A;   DILATION AND CURETTAGE OF UTERUS     20 years ago   LAPAROSCOPIC VAGINAL HYSTERECTOMY WITH SALPINGO OOPHORECTOMY Bilateral 04/12/2022   Procedure: LAPAROSCOPIC ASSISTED VAGINAL HYSTERECTOMY WITH SALPINGO OOPHORECTOMY;  Surgeon: Michelle Alm Agent, MD;  Location: [facility];  Service: Gynecology;  Laterality: Bilateral;   MASTECTOMY Left    2021   NOVASURE ABLATION     POLYPECTOMY N/A 04/16/2019   Procedure: POLYPECTOMY;  Surgeon: Michelle Carmine, MD;  Location: [facility];  Service: Endoscopy;  Laterality: N/A;   PORTACATH PLACEMENT Right 12/01/2020   Procedure: INSERTION PORT-A-CATH;  Surgeon: Michelle Shope, MD;  Location: [facility];  Service: General;  Laterality: Right;   TOTAL MASTECTOMY Left 05/06/2021   Procedure: modified radical mastectomy;  Surgeon: Michelle Shope, MD;  Location: [facility];  Service: General;  Laterality: Left;  Provider requesting 1.5 hours / 90 minutes for procedure     Current Outpatient Medications  Medication Sig Dispense Refill   albuterol  (VENTOLIN  HFA)  108 (90 Base) MCG/ACT inhaler Inhale 2 puffs into the lungs every 6 (six) hours as needed for wheezing or shortness of breath. 8 g 2   allopurinol  (ZYLOPRIM ) 100 MG tablet Take 100 mg by mouth at bedtime.     amLODipine  (NORVASC ) 10 MG tablet Take 10 mg by mouth daily.     aspirin  EC 81 MG tablet Take 1 tablet (81 mg total) by mouth daily. Swallow whole. 180 tablet 1   atorvastatin  (LIPITOR) 10 MG tablet Take 10 mg by mouth daily.     DULoxetine  (CYMBALTA ) 60 MG capsule TAKE 1 CAPSULE(60 MG) BY MOUTH DAILY 30 capsule 1   letrozole   (FEMARA ) 2.5 MG tablet TAKE 1 TABLET(2.5 MG) BY MOUTH DAILY 90 tablet 1   losartan -hydrochlorothiazide  (HYZAAR) 100-12.5 MG tablet TAKE 1 TABLET BY MOUTH DAILY 90 tablet 1   pregabalin  (LYRICA ) 75 MG capsule TAKE 1 CAPSULE(75 MG) BY MOUTH TWICE DAILY 60 capsule 0   sotalol  (BETAPACE ) 120 MG tablet Take 1 tablet (120 mg total) by mouth 2 (two) times daily. 60 tablet 11   spironolactone  (ALDACTONE ) 25 MG tablet TAKE 1 TABLET(25 MG) BY MOUTH DAILY 90 tablet 3   No current facility-administered medications for this visit.    Allergies:   Triamcinolone and Tape    Social History:   reports that she has been smoking cigarettes. She has a 30 pack-year smoking history. She has never used smokeless tobacco. She reports current alcohol use of about 2.0 standard drinks of alcohol per week. She reports that she does not use drugs.   Family History:  family history includes Cancer in her sister; Diabetes in her brother and sister; Stroke in her mother and sister.    ROS:     Review of Systems  Constitutional: Negative.   HENT: Negative.    Eyes: Negative.   Respiratory: Negative.    Gastrointestinal: Negative.   Genitourinary: Negative.   Musculoskeletal: Negative.   Skin: Negative.   Neurological: Negative.   Endo/Heme/Allergies: Negative.   Psychiatric/Behavioral: Negative.    All other systems reviewed and are negative.     All other systems are reviewed and negative.    PHYSICAL EXAM: VS:  BP 104/60   Pulse 98   Ht 5' 6 (1.676 m)   Wt 199 lb (90.3 kg)   LMP 01/05/2017   SpO2 94%   BMI 32.12 kg/m  , BMI Body mass index is 32.12 kg/m. Last weight:  Wt Readings from Last 3 Encounters:  08/13/24 199 lb (90.3 kg)  06/25/24 197 lb 12.8 oz (89.7 kg)  06/19/24 196 lb (88.9 kg)     Physical Exam Constitutional:      Appearance: Normal appearance.  Cardiovascular:     Rate and Rhythm: Normal rate and regular rhythm.     Heart sounds: Normal heart sounds.  Pulmonary:      Effort: Pulmonary effort is normal.     Breath sounds: Normal breath sounds.  Musculoskeletal:     Right lower leg: No edema.     Left lower leg: No edema.  Neurological:     Mental Status: She is alert.       EKG:   Recent Labs: 06/07/2024: ALT 13; BUN 14; Creatinine, Ser 0.72; Hemoglobin 13.6; Platelets 214; Potassium 4.0; Sodium 136    Lipid Panel    Component Value Date/Time   CHOL 161 01/29/2021 0827   TRIG 119 01/29/2021 0827   HDL 42 01/29/2021 0827   CHOLHDL 3.8 01/29/2021 0827  VLDL 24 01/29/2021 0827   LDLCALC 95 01/29/2021 0827      Other studies Reviewed: Additional studies/ records that were reviewed today include:  Review of the above records demonstrates:       No data to display            ASSESSMENT AND PLAN:    ICD-10-CM   1. Primary hypertension  I10     2. Coronary artery disease involving native coronary artery of native heart without angina pectoris  I25.10     3. Breast cancer metastasized to axillary lymph node, right (HCC)  C50.911    C77.3     4. Alcohol use  F10.90     5. Tobacco smoker, 1 pack of cigarettes or less per day  F17.210     6. Vasovagal syncope  R55     7. Mixed hyperlipidemia  E78.2     8. SVT (supraventricular tachycardia)  I47.10    Sotolol is working out good, no further palpitations.       Problem List Items Addressed This Visit       Cardiovascular and Mediastinum   Primary hypertension - Primary (Chronic)   Coronary artery disease involving native coronary artery of native heart without angina pectoris     Immune and Lymphatic   Breast cancer metastasized to axillary lymph node, right (HCC)     Other   Alcohol use (Chronic)   Vasovagal syncope   Mixed hyperlipidemia   Tobacco smoker, 1 pack of cigarettes or less per day   Other Visit Diagnoses       SVT (supraventricular tachycardia)       Sotolol is working out good, no further palpitations.          Disposition:   Return in  about 4 weeks (around 09/10/2024).    Total time spent: 30 minutes  Signed,  Michelle Bathe, MD  08/13/2024 3:44 PM    [facility]

## 2024-08-15 ENCOUNTER — Encounter: Payer: Self-pay | Admitting: Oncology

## 2024-08-15 ENCOUNTER — Inpatient Hospital Stay: Attending: Oncology | Admitting: Oncology

## 2024-08-15 VITALS — BP 115/77 | HR 86 | Temp 97.7°F | Resp 16 | Ht 66.0 in | Wt 202.0 lb

## 2024-08-15 DIAGNOSIS — Z1732 Human epidermal growth factor receptor 2 negative status: Secondary | ICD-10-CM | POA: Diagnosis not present

## 2024-08-15 DIAGNOSIS — Z923 Personal history of irradiation: Secondary | ICD-10-CM | POA: Diagnosis not present

## 2024-08-15 DIAGNOSIS — C773 Secondary and unspecified malignant neoplasm of axilla and upper limb lymph nodes: Secondary | ICD-10-CM | POA: Insufficient documentation

## 2024-08-15 DIAGNOSIS — Z9012 Acquired absence of left breast and nipple: Secondary | ICD-10-CM | POA: Insufficient documentation

## 2024-08-15 DIAGNOSIS — F17210 Nicotine dependence, cigarettes, uncomplicated: Secondary | ICD-10-CM | POA: Diagnosis not present

## 2024-08-15 DIAGNOSIS — Z170 Estrogen receptor positive status [ER+]: Secondary | ICD-10-CM | POA: Diagnosis not present

## 2024-08-15 DIAGNOSIS — G620 Drug-induced polyneuropathy: Secondary | ICD-10-CM | POA: Insufficient documentation

## 2024-08-15 DIAGNOSIS — M1611 Unilateral primary osteoarthritis, right hip: Secondary | ICD-10-CM

## 2024-08-15 DIAGNOSIS — Z9221 Personal history of antineoplastic chemotherapy: Secondary | ICD-10-CM | POA: Insufficient documentation

## 2024-08-15 DIAGNOSIS — Z79811 Long term (current) use of aromatase inhibitors: Secondary | ICD-10-CM | POA: Insufficient documentation

## 2024-08-15 DIAGNOSIS — C50912 Malignant neoplasm of unspecified site of left female breast: Secondary | ICD-10-CM | POA: Diagnosis not present

## 2024-08-15 DIAGNOSIS — Z853 Personal history of malignant neoplasm of breast: Secondary | ICD-10-CM

## 2024-08-15 DIAGNOSIS — M47816 Spondylosis without myelopathy or radiculopathy, lumbar region: Secondary | ICD-10-CM | POA: Diagnosis not present

## 2024-08-15 DIAGNOSIS — Z1721 Progesterone receptor positive status: Secondary | ICD-10-CM | POA: Diagnosis not present

## 2024-08-15 DIAGNOSIS — C50911 Malignant neoplasm of unspecified site of right female breast: Secondary | ICD-10-CM | POA: Diagnosis present

## 2024-08-15 DIAGNOSIS — M169 Osteoarthritis of hip, unspecified: Secondary | ICD-10-CM | POA: Insufficient documentation

## 2024-08-15 DIAGNOSIS — T451X5A Adverse effect of antineoplastic and immunosuppressive drugs, initial encounter: Secondary | ICD-10-CM

## 2024-08-15 DIAGNOSIS — Z808 Family history of malignant neoplasm of other organs or systems: Secondary | ICD-10-CM | POA: Diagnosis not present

## 2024-08-15 DIAGNOSIS — Z79899 Other long term (current) drug therapy: Secondary | ICD-10-CM

## 2024-08-15 MED ORDER — PREGABALIN 75 MG PO CAPS: ORAL_CAPSULE | ORAL | 1 refills | Status: AC

## 2024-08-15 NOTE — Progress Notes (Signed)
 Needs refill lyrica , needs 90 day supply for new insurance, pended.  Still having soreness on the lt side of the breast.

## 2024-08-15 NOTE — Progress Notes (Signed)
 Hematology/Oncology Consult note [facility]  Telephone:([phone] [phone]  Patient Care Team: Fernand Fredy RAMAN, MD as PCP - General (Internal Medicine) Weyman Bright, MD as Consulting Physician (Internal Medicine) Dellie Louanne MATSU, MD (General Surgery) Verta Royden DASEN, [place] as Consulting Physician (Podiatry) Melanee Annah BROCKS, MD as Consulting Physician (Hematology and Oncology) Lane Shope, MD as Consulting Physician (General Surgery) Lenn Aran, MD as Consulting Physician (Radiation Oncology)   Name of the patient: Michelle Walker  [id number]  1961-10-02   Date of visit: 08/15/24  Diagnosis- h/o  recurrent left breast cancer stage II [id number] ER/PR positive HER-2 negative    Chief complaint/ Reason for visit-routine follow-up visit for breast cancer  Heme/Onc history: Patient is a 63 year old female who was diagnosed with 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  She did not require adjuvant chemotherapy.  She was initially on tamoxifen  which she could not tolerate and was switched to letrozole .  Last seen by me in June 2019 at which point she had a menstrual cycle and therefore not given another AI.  She had subsequently did not follow-up with me and remained off hormone therapy.     More recently patient underwent a diagnostic bilateral mammogram which showed a 2.2 x 1.9 x 1.9 cm mass at the 12 o'clock position of the left breast 1 cm from the nipple.  She was also found to have 5 other smaller breast masses ranging from 0.3 to 0.8 cm.  Noted to have a solitary left axillary lymph node with cortical thickening.  She had a biopsy of the dominant left breast mass as well as another smaller breast mass and the left axillary lymph node all of which were positive for metastatic invasive mammary carcinoma grade 2.  Tumor was ER 91 200% positive PR 71 to 80% positive and  HER-2 IHC equivocal but negative by FISH   MRI of the bilateral breasts showed no abnormal findings in the right breast.  At least 6 discrete hypoechoic masses in the left breast with the largest one measuring 2.2 cm with 1 abnormal left axillary lymph node.  The size of the other breast masses ranging from 4mm to 1.4 cm.  The total area of masses and non-mass enhancement was about 10 x 5 x 6.5 cm.  CT chest abdomen and pelvis with contrast showed no evidence of distant metastatic disease.  Bone scan was also negative.   MammaPrint done on the biopsy specimen came back as high risk with greater than 12% chemotherapy benefit.  Plan is for neoadjuvant dose dense AC-Taxol  chemotherapy followed by surgery.  Start of chemotherapy was delayed since patient tested positive for Covid   Patient completed neoadjuvant ddAC- taxol  chemo between feb-June 2022.    Patient underwent left mastectomy with axillary lymph node dissection.  Final pathology showed 12 mm multifocal invasive mammary carcinoma at least 3 of them in number overall grade 2 with negative margins.  Metastatic carcinoma involving 3 out of 12 lymph nodes.  No extranodal extension identified.  Largest site of metastatic deposit 3 mm.  pT1c pN1a   Patient completed adjuvant radiation therapy in October 2022.  patient completed adjuvant zometa  as well  Interval history- Michelle Walker is a 63 year old female with breast cancer who presents for follow-up regarding neuropathy and hip pain.  She has persistent neuropathy without improvement or worsening. No falls have occurred due to neuropathy, but  she did experience a fall when she stepped wrong, causing her hip to 'pop'. She is currently taking Lyrica  for neuropathy management.  She reports ongoing hip pain and is under the care of orthopedics. Recent MRIs of her spine and hip showed arthritis but no other significant findings. She has been receiving hip injections for the past two to two and a  half years, but the relief from the last injection was short-lived, lasting only about two weeks. Further MRIs revealed arthritis in her lower back, and she is scheduled for another injection on the 23rd of this month.  Regarding her breast cancer, she continues to take letrozole  without issues and had a mammogram in April that was normal. She reports soreness and tenderness at the site of her previous breast surgery.  ECOG PS- 1 Pain scale- 3 Opioid associated constipation- no  Review of systems- Review of Systems  Constitutional:  Negative for chills, fever, malaise/fatigue and weight loss.  HENT:  Negative for congestion, ear discharge and nosebleeds.   Eyes:  Negative for blurred vision.  Respiratory:  Negative for cough, hemoptysis, sputum production, shortness of breath and wheezing.   Cardiovascular:  Negative for chest pain, palpitations, orthopnea and claudication.  Gastrointestinal:  Negative for abdominal pain, blood in stool, constipation, diarrhea, heartburn, melena, nausea and vomiting.  Genitourinary:  Negative for dysuria, flank pain, frequency, hematuria and urgency.  Musculoskeletal:  Positive for back pain and joint pain. Negative for myalgias.  Skin:  Negative for rash.  Neurological:  Positive for sensory change (Peripheral neuropathy). Negative for dizziness, tingling, focal weakness, seizures, weakness and headaches.  Endo/Heme/Allergies:  Does not bruise/bleed easily.  Psychiatric/Behavioral:  Negative for depression and suicidal ideas. The patient does not have insomnia.       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 Wide excision/ RT, T1a,N0. ER/PR pos, Her 2.neg. Tamoxifen  x 6 months, d/c  secondary to vasomotor sympotms.    Flu 10/08/2022   Headache    HTN (hypertension) 09/20/2017   Hypertension    Hypokalemia 10/10/2022   Hypoxia 02/27/2022   Last Assessment & Plan: Formatting of this note might be different from the original. 63 year old female with history of hypertension, gout, breast cancer left mastectomy on oral hormonal therapy, smoker, alcohol user on daily basis came to the emergency room after passing out.  Fell off from the bar stool. Loss of consciousness is unknown.  Patient found to be hypoxic in the ER.  On room air sats   Migraine 09/20/2017   Multifocal pneumonia 10/08/2022   Pain in joint of right hip 09/10/2021   Peripheral neuropathy    Personal history of radiation therapy 2016   RIGHT lumpectomy   Post-operative state 04/12/2022   Respiratory failure (HCC) 10/06/2022   Thyroid  disease      Past Surgical History:  Procedure Laterality Date   ANTERIOR CRUCIATE LIGAMENT REPAIR Right    BREAST BIOPSY Right 07/17/2015   INVASIVE LOBULAR CARCINOMA, CLASSIC TYPE (1.5 MM), ARISING IN A    BREAST BIOPSY Left 09/19/2020   us  bx 3 areas/ 1oc q clip/ 12 oc vision clip, axilla lymph node hydromark shape 3/ path pending   BREAST  LUMPECTOMY Right 08/22/2015   BREAST LUMPECTOMY WITH SENTINEL LYMPH NODE BIOPSY Right 08/22/2015   Procedure: BREAST LUMPECTOMY WITH SENTINEL LYMPH NODE BX;  Surgeon: Louanne KANDICE Muse, MD;  Location: [facility] ORS;  Service: General;  Laterality: Right;   COLONOSCOPY W/ POLYPECTOMY  2014   COLONOSCOPY WITH PROPOFOL  N/A 04/16/2019   Procedure: COLONOSCOPY WITH BIOPSIES;  Surgeon: Jinny Carmine, MD;  Location: [facility];  Service: Endoscopy;  Laterality: N/A;   DILATION AND CURETTAGE OF UTERUS     20 years ago   LAPAROSCOPIC VAGINAL HYSTERECTOMY WITH SALPINGO OOPHORECTOMY Bilateral 04/12/2022   Procedure: LAPAROSCOPIC ASSISTED VAGINAL HYSTERECTOMY WITH SALPINGO OOPHORECTOMY;  Surgeon: Janit Alm Agent, MD;  Location: [facility]  ORS;  Service: Gynecology;  Laterality: Bilateral;   MASTECTOMY Left    2021   NOVASURE ABLATION     POLYPECTOMY N/A 04/16/2019   Procedure: POLYPECTOMY;  Surgeon: Jinny Carmine, MD;  Location: [facility];  Service: Endoscopy;  Laterality: N/A;   PORTACATH PLACEMENT Right 12/01/2020   Procedure: INSERTION PORT-A-CATH;  Surgeon: Lane Shope, MD;  Location: [facility] ORS;  Service: General;  Laterality: Right;   TOTAL MASTECTOMY Left 05/06/2021   Procedure: modified radical mastectomy;  Surgeon: Lane Shope, MD;  Location: [facility] ORS;  Service: General;  Laterality: Left;  Provider requesting 1.5 hours / 90 minutes for procedure    Social History   Socioeconomic History   Marital status: Married    Spouse name: Vick   Number of children: 1   Years of education: Not on file   Highest education level: Not on file  Occupational History   Not on file  Tobacco Use   Smoking status: Every Day    Current packs/day: 0.75    Average packs/day: 0.8 packs/day for 40.0 years (30.0 ttl pk-yrs)    Types: Cigarettes   Smokeless tobacco: Never  Vaping Use   Vaping status: Never Used  Substance and Sexual Activity   Alcohol use: Yes    Alcohol/week: 2.0 standard drinks of alcohol    Types: 2 Standard drinks or equivalent per week    Comment: BEER 1-2 QD   Drug use: No   Sexual activity: Yes    Birth control/protection: Surgical    Comment: Hysterectomy  Other Topics Concern   Not on file  Social History Narrative   Daughter and 3 kids in home, her Brother is in the home as well.   Social Drivers of Corporate investment banker Strain: Low Risk  (08/07/2024)   Received from [facility]   Overall Financial Resource Strain (CARDIA)    Difficulty of Paying Living Expenses: Not hard at all  Food Insecurity: No Food Insecurity (08/07/2024)   Received from [facility]   Hunger Vital Sign    Within the past 12 months, you worried that your food  would run out before you got the money to buy more.: Never true    Within the past 12 months, the food you bought just didn't last and you didn't have money to get more.: Never true  Transportation Needs: No Transportation Needs (08/07/2024)   Received from [facility] - Transportation    In the past 12 months, has lack of transportation kept you from medical appointments or from getting medications?: No    Lack of Transportation (Non-Medical): No  Physical Activity: Not on file  Stress: Not on file  Social Connections: Not on file  Intimate Partner Violence: Not on  file    Family History  Problem Relation Age of Onset   Stroke Mother    Cancer Sister        sarcoma on arm; chemo   Diabetes Sister    Stroke Sister    Diabetes Brother    Breast cancer Neg Hx    Ovarian cancer Neg Hx    Colon cancer Neg Hx    Heart disease Neg Hx      Current Outpatient Medications:    albuterol  (VENTOLIN  HFA) 108 (90 Base) MCG/ACT inhaler, Inhale 2 puffs into the lungs every 6 (six) hours as needed for wheezing or shortness of breath., Disp: 8 g, Rfl: 2   allopurinol  (ZYLOPRIM ) 100 MG tablet, Take 100 mg by mouth at bedtime., Disp: , Rfl:    amLODipine  (NORVASC ) 10 MG tablet, Take 10 mg by mouth daily., Disp: , Rfl:    aspirin  EC 81 MG tablet, Take 1 tablet (81 mg total) by mouth daily. Swallow whole., Disp: 180 tablet, Rfl: 1   atorvastatin  (LIPITOR) 10 MG tablet, Take 10 mg by mouth daily., Disp: , Rfl:    DULoxetine  (CYMBALTA ) 60 MG capsule, TAKE 1 CAPSULE(60 MG) BY MOUTH DAILY, Disp: 30 capsule, Rfl: 1   letrozole  (FEMARA ) 2.5 MG tablet, TAKE 1 TABLET(2.5 MG) BY MOUTH DAILY, Disp: 90 tablet, Rfl: 1   losartan -hydrochlorothiazide  (HYZAAR) 100-12.5 MG tablet, TAKE 1 TABLET BY MOUTH DAILY, Disp: 90 tablet, Rfl: 1   sotalol  (BETAPACE ) 120 MG tablet, Take 1 tablet (120 mg total) by mouth 2 (two) times daily., Disp: 60 tablet, Rfl: 11   spironolactone  (ALDACTONE ) 25 MG  tablet, TAKE 1 TABLET(25 MG) BY MOUTH DAILY, Disp: 90 tablet, Rfl: 3   pregabalin  (LYRICA ) 75 MG capsule, TAKE 1 CAPSULE(75 MG) BY MOUTH TWICE DAILY, Disp: 90 capsule, Rfl: 1  Physical exam:  Vitals:   08/15/24 1454  BP: 115/77  Pulse: 86  Resp: 16  Temp: 97.7 F (36.5 C)  TempSrc: Tympanic  SpO2: 100%  Weight: 202 lb (91.6 kg)  Height: 5' 6 (1.676 m)   Physical Exam Cardiovascular:     Rate and Rhythm: Normal rate and regular rhythm.     Heart sounds: Normal heart sounds.  Pulmonary:     Effort: Pulmonary effort is normal.     Breath sounds: Normal breath sounds.  Skin:    General: Skin is warm and dry.  Neurological:     Mental Status: She is alert and oriented to person, place, and time.   Breast exam: Patient is s/p left mastectomy without reconstruction.  No evidence of chest wall recurrence.  She is status post a right lumpectomy with a well-healed surgical scar.  No palpable masses in the right breast.  No palpable bilateral axillary adenopathy  I have personally reviewed labs listed below:    Latest Ref Rng & Units 06/07/2024   11:32 AM  CMP  Glucose 70 - 99 mg/dL 96   BUN 8 - 23 mg/dL 14   Creatinine 9.55 - 1.00 mg/dL 9.27   Sodium 864 - 854 mmol/L 136   Potassium 3.5 - 5.1 mmol/L 4.0   Chloride 98 - 111 mmol/L 103   CO2 22 - 32 mmol/L 23   Calcium  8.9 - 10.3 mg/dL 89.8   Total Protein 6.5 - 8.1 g/dL 7.7   Total Bilirubin 0.0 - 1.2 mg/dL 0.7   Alkaline Phos 38 - 126 U/L 61   AST 15 - 41 U/L 23   ALT 0 - 44  U/L 13       Latest Ref Rng & Units 06/07/2024   11:32 AM  CBC  WBC 4.0 - 10.5 K/uL 5.7   Hemoglobin 12.0 - 15.0 g/dL 86.3   Hematocrit 63.9 - 46.0 % 42.0   Platelets 150 - 400 K/uL 214    I have personally reviewed Radiology images listed below: No images are attached to the encounter.  MR LUMBAR SPINE WO CONTRAST Result Date: 07/25/2024 EXAM: MRI LUMBAR SPINE 07/21/2024 02:43:56 PM TECHNIQUE: Multiplanar multisequence MRI of the lumbar spine was  performed without the administration of intravenous contrast. COMPARISON: None available. CLINICAL HISTORY: Chronic LBP that radiates into both hips without injury. FINDINGS: BONES AND ALIGNMENT: Slight degenerative anterolisthesis at L4-5 with mild-to-moderate facet arthrosis. Normal vertebral body heights. Bone marrow signal is unremarkable. SPINAL CORD: The conus medullaris terminates at L1-2. SOFT TISSUES: No paraspinal mass. L1-L2: No significant disc herniation. No spinal canal stenosis or neural foraminal narrowing. L2-L3: No significant disc herniation. No spinal canal stenosis or neural foraminal narrowing. L3-L4: No significant disc herniation. No spinal canal stenosis or neural foraminal narrowing. L4-L5: Slight degenerative anterolisthesis with mild-to-moderate facet arthrosis. Mild central spinal canal stenosis and mild bilateral lateral recess stenosis. L5-S1: Broad-based disc bulge and small left posterolateral protrusion with mild-to-moderate left neuroforaminal stenosis, but no apparent nerve root impingement. IMPRESSION: 1. Slight degenerative anterolisthesis at L4-5 with mild-to-moderate facet arthrosis, mild central spinal canal stenosis, and mild bilateral lateral recess stenosis. 2. Broad-based disc bulge and small left posterolateral protrusion at L5-S1 with mild-to-moderate left neuroforaminal stenosis, but no apparent nerve root impingement. Electronically signed by: Evalene Coho MD 07/25/2024 08:54 AM EDT RP Workstation: [id number]     Assessment and plan- Patient is a 63 y.o. female with history of right breast cancer s/p lumpectomy stage II ER/PR positive HER2 negative.  History of left breast cancer T1c N1a s/p neoadjuvant AC Taxol  chemotherapy followed by mastectomy.  She is presently on letrozole  and this is a routine follow-up visit  Breast cancer on endocrine therapy Breast cancer managed with endocrine therapy. Soreness at previous surgery site noted. Normal mammogram in  April. No issues with letrozole . Clinically patient is doing well with no concerning signs and symptoms of recurrence based on today's exam.  Pain over left chest wall likely postmastectomy pain.  Drug-induced polyneuropathy Neuropathy persistent without change. No falls reported. Lyrica  prescribed for symptom management. - Prescribe Lyrica  for neuropathy management.  Osteoarthritis of hip and lumbar spine Osteoarthritis in hip and lumbar spine confirmed by MRI. Previous hip injections provided temporary relief. Scheduled for deeper injections. - Proceed with scheduled injections for osteoarthritis on October 23rd.   Visit Diagnosis 1. Chemotherapy-induced peripheral neuropathy   2. High risk medication use   3. Encounter for follow-up surveillance of breast cancer   4. Use of letrozole  (Femara )      Dr. Annah Skene, MD, MPH [facility] [phone] 08/15/2024 3:40 PM

## 2024-09-17 ENCOUNTER — Emergency Department

## 2024-09-17 ENCOUNTER — Other Ambulatory Visit: Payer: Self-pay

## 2024-09-17 ENCOUNTER — Emergency Department: Admission: EM | Admit: 2024-09-17 | Discharge: 2024-09-18 | Disposition: A | Discharge status: Home / Self Care

## 2024-09-17 DIAGNOSIS — N179 Acute kidney failure, unspecified: Secondary | ICD-10-CM | POA: Insufficient documentation

## 2024-09-17 DIAGNOSIS — R42 Dizziness and giddiness: Secondary | ICD-10-CM | POA: Diagnosis present

## 2024-09-17 DIAGNOSIS — Z853 Personal history of malignant neoplasm of breast: Secondary | ICD-10-CM | POA: Diagnosis not present

## 2024-09-17 DIAGNOSIS — I10 Essential (primary) hypertension: Secondary | ICD-10-CM | POA: Diagnosis not present

## 2024-09-17 DIAGNOSIS — E119 Type 2 diabetes mellitus without complications: Secondary | ICD-10-CM | POA: Diagnosis not present

## 2024-09-17 DIAGNOSIS — R55 Syncope and collapse: Secondary | ICD-10-CM

## 2024-09-17 LAB — BASIC METABOLIC PANEL WITH GFR
Anion gap: 15 (ref 5–15)
BUN: 25 mg/dL — ABNORMAL HIGH (ref 8–23)
CO2: 20 mmol/L — ABNORMAL LOW (ref 22–32)
Calcium: 10.1 mg/dL (ref 8.9–10.3)
Chloride: 99 mmol/L (ref 98–111)
Creatinine, Ser: 1.09 mg/dL — ABNORMAL HIGH (ref 0.44–1.00)
GFR, Estimated: 58 mL/min — ABNORMAL LOW (ref >60)
Glucose, Bld: 114 mg/dL — ABNORMAL HIGH (ref 70–99)
Potassium: 3.6 mmol/L (ref 3.5–5.1)
Sodium: 134 mmol/L — ABNORMAL LOW (ref 135–145)

## 2024-09-17 LAB — CBC
HCT: 43.2 % (ref 36.0–46.0)
Hemoglobin: 14.3 g/dL (ref 12.0–15.0)
MCH: 27 pg (ref 26.0–34.0)
MCHC: 33.1 g/dL (ref 30.0–36.0)
MCV: 81.7 fL (ref 80.0–100.0)
Platelets: 256 K/uL (ref 150–400)
RBC: 5.29 MIL/uL — ABNORMAL HIGH (ref 3.87–5.11)
RDW: 13.4 % (ref 11.5–15.5)
WBC: 10.1 K/uL (ref 4.0–10.5)
nRBC: 0 % (ref 0.0–0.2)

## 2024-09-17 LAB — TROPONIN T, HIGH SENSITIVITY
Troponin T High Sensitivity: <15 ng/L (ref 0–19)
Troponin T High Sensitivity: <15 ng/L (ref 0–19)

## 2024-09-17 MED ORDER — SODIUM CHLORIDE 0.9 % IV BOLUS
500.0000 mL | Freq: Once | INTRAVENOUS | Status: AC
  Administered 2024-09-17: 500 mL via INTRAVENOUS

## 2024-09-17 NOTE — ED Triage Notes (Signed)
 Pt arrives via EMS from home for c/o near syncope while going to [place] then onset midsternal CP

## 2024-09-17 NOTE — ED Provider Notes (Signed)
 [facility] Provider Note    Event Date/Time   First MD Initiated Contact with Patient 09/17/24 2300     (approximate)   History   Dizziness  Pt arrives via EMS from home for c/o near syncope while going to [place] then onset midsternal CP   Pt to ED via EMS from home, pt reports she was using the bathroom and was pushing became dizzy lightheaded and diaphoretic. Pt reports she had chest pain momentarily also. Pt reports still feeling lightheaded.    HPI Michelle Walker is a 63 y.o. female PMH DM2, hypertension, hyperlipidemia, breast cancer status post left mastectomy and chemotherapy presents for evaluation of lightheadedness and chest discomfort - Patient notes that at about 830 she was trying to have a bowel movement and straining when she suddenly became lightheaded.  Felt that her heart rate may have gotten slow.  Experienced a vague chest discomfort but no frank pain.  Sensation has resolved. - Otherwise has been in her usual state of health except for recent cough.  Did travel to visit family in [place] over the weekend. - No leg swelling, no shortness of breath, no pleuritic discomfort.  No history of DVT/PE.  No recent surgery/stasis/travel. - Has otherwise been in her usual state of health.  No recent urinary symptoms.   Per chart review, recently seen in our emergency department on 8/7/202 for near syncopal episode.  Did have a cardiology visit in June of this year, noted to have long-term cardiac monitor from January of this year for 2 weeks.  Couple episodes of SVT with rates up to 160s, otherwise mostly NSR.  Workup at that visit unremarkable, discharged home.  Cardiology visit from 08/13/2024 reviewed, appears patient is on sotalol  for paroxysmal SVT.  Stress test in July of this year showed no concerns for ischemia and normal EF.     Physical Exam   Triage Vital Signs: ED Triage Vitals  Encounter Vitals Group     BP 09/17/24 2105 124/76      Girls Systolic BP Percentile --      Girls Diastolic BP Percentile --      Boys Systolic BP Percentile --      Boys Diastolic BP Percentile --      Pulse Rate 09/17/24 2105 (!) 110     Resp 09/17/24 2105 18     Temp 09/17/24 2105 (!) 97.4 F (36.3 C)     Temp Source 09/17/24 2105 Oral     SpO2 09/17/24 2105 100 %     Weight 09/17/24 2107 191 lb (86.6 kg)     Height 09/17/24 2107 5' 6 (1.676 m)     Head Circumference --      Peak Flow --      Pain Score 09/17/24 2107 3     Pain Loc --      Pain Education --      Exclude from Growth Chart --     Most recent vital signs: Vitals:   09/18/24 0100 09/18/24 0130  BP: 131/82 117/85  Pulse: (!) 110 (!) 104  Resp: (!) 25 20  Temp:    SpO2: 92% 95%     General: Awake, no distress.  CV:  Good peripheral perfusion. RRR (heart rate 90s to low 100s throughout my eval), RP 2+ Resp:  Normal effort. CTAB Abd:  No distention. Nontender to deep palpation throughout Other:  No lower extremity swelling appreciated   ED Results / Procedures / Treatments  Labs (all labs ordered are listed, but only abnormal results are displayed) Labs Reviewed  BASIC METABOLIC PANEL WITH GFR - Abnormal; Notable for the following components:      Result Value   Sodium 134 (*)    CO2 20 (*)    Glucose, Bld 114 (*)    BUN 25 (*)    Creatinine, Ser 1.09 (*)    GFR, Estimated 58 (*)    All other components within normal limits  CBC - Abnormal; Notable for the following components:   RBC 5.29 (*)    All other components within normal limits  RESP PANEL BY RT-PCR (RSV, FLU A&B, COVID)  [id number]  TROPONIN T, HIGH SENSITIVITY  TROPONIN T, HIGH SENSITIVITY     EKG  See ED course below   RADIOLOGY Radiology interpreted by myself and radiology report reviewed.  No acute pathology identified.    PROCEDURES:  Critical Care performed: No  Procedures   MEDICATIONS ORDERED IN ED: Medications  sodium chloride  0.9 % bolus 500 mL (0 mLs  Intravenous Stopped 09/18/24 0108)     IMPRESSION / MDM / ASSESSMENT AND PLAN / ED COURSE  I reviewed the triage vital signs and the nursing notes.                              DDX/MDM/AP: Differential diagnosis includes, but is not limited to, vasovagal episode in the setting of straining for a bowel movement, consider episode of SVT, considered but doubt ACS, do not clinically suspect PE at this time.  Consider underlying dehydration.  Do not suspect UTI negative associated review of symptoms and acute onset of episode with resolution prior to my eval.  Also consider viral syndrome contributing with recent URI symptoms, doubt underlying ammonia.  Plan: - Labs - Chest x-ray - EKG - Reassess  Patient's presentation is most consistent with acute presentation with potential threat to life or bodily function.  The patient is on the cardiac monitor to evaluate for evidence of arrhythmia and/or significant heart rate changes.  ED course below.  Labs with mild AKI suggestive of underlying dehydration, given 500 cc IV fluid bolus.  Chest x-ray unremarkable.  Serial troponins negative.   Has also had negative recent outpatient cardiac workup including stress test earlier this year.  Does have a cardiology appointment later today at 3:30 PM.  He remains mildly elevated though appears this is at baseline per review of prior encounters.  Remains asymptomatic here and amenable to discharge home.  Will follow-up with cardiologist later today as planned.  ED return precautions in place.  Patient agrees with plan.  Clinical Course as of 09/18/24 0224  Mon Sep 17, 2024  2311 BMP with mild AKI, otherwise unremarkable  Cbc reviewed, unremarkable  Troponin normal [MM]  2312 CXR: IMPRESSION: No active cardiopulmonary disease.   [MM]  2312 Ecg = sinus tachycardia, rate 110, no gross ST elevation or depression, no significant repolarization abnormality, normal axis, normal intervals.  No clear evidence  of ischemia no arrhythmia my interpretation. [MM]  Tue Sep 18, 2024  0000 Rpt trop stable [MM]  0134 Per chart review, appears pt has some mild tachycardia at baseline, not new today [MM]  0135 Call lab, they are unsure why there has been a delay in the viral swab, will run test again [MM]  0217 Viral swab neg [MM]    Clinical Course User Index [MM] Clarine Ozell LABOR, MD  FINAL CLINICAL IMPRESSION(S) / ED DIAGNOSES   Final diagnoses:  Near syncope  AKI (acute kidney injury)     Rx / DC Orders   ED Discharge Orders     None        Note:  This document was prepared using Dragon voice recognition software and may include unintentional dictation errors.   Clarine Ozell LABOR, MD 09/18/24 [phone]

## 2024-09-17 NOTE — ED Notes (Signed)
 Lab called for blood draw needed

## 2024-09-17 NOTE — ED Triage Notes (Signed)
 Pt to ED via EMS from home, pt reports she was using the bathroom and was pushing became dizzy lightheaded and diaphoretic. Pt reports she had chest pain momentarily also. Pt reports still feeling lightheaded.

## 2024-09-18 ENCOUNTER — Ambulatory Visit (INDEPENDENT_AMBULATORY_CARE_PROVIDER_SITE_OTHER): Admitting: Cardiovascular Disease

## 2024-09-18 ENCOUNTER — Ambulatory Visit: Payer: Self-pay | Admitting: Cardiovascular Disease

## 2024-09-18 ENCOUNTER — Encounter: Payer: Self-pay | Admitting: Cardiovascular Disease

## 2024-09-18 VITALS — BP 106/70 | HR 79 | Ht 66.0 in | Wt 197.0 lb

## 2024-09-18 DIAGNOSIS — E782 Mixed hyperlipidemia: Secondary | ICD-10-CM | POA: Diagnosis not present

## 2024-09-18 DIAGNOSIS — F17210 Nicotine dependence, cigarettes, uncomplicated: Secondary | ICD-10-CM

## 2024-09-18 DIAGNOSIS — R0789 Other chest pain: Secondary | ICD-10-CM

## 2024-09-18 DIAGNOSIS — I10 Essential (primary) hypertension: Secondary | ICD-10-CM | POA: Diagnosis not present

## 2024-09-18 DIAGNOSIS — I200 Unstable angina: Secondary | ICD-10-CM

## 2024-09-18 DIAGNOSIS — R55 Syncope and collapse: Secondary | ICD-10-CM

## 2024-09-18 DIAGNOSIS — I2510 Atherosclerotic heart disease of native coronary artery without angina pectoris: Secondary | ICD-10-CM

## 2024-09-18 LAB — RESP PANEL BY RT-PCR (RSV, FLU A&B, COVID)  RVPGX2
Influenza A by PCR: NEGATIVE
Influenza B by PCR: NEGATIVE
Resp Syncytial Virus by PCR: NEGATIVE
SARS Coronavirus 2 by RT PCR: NEGATIVE

## 2024-09-18 NOTE — Progress Notes (Addendum)
 Cardiology Office Note   Date:  09/18/2024   ID:  Michelle Walker, DOB 19-Dec-1960, MRN [id number]  PCP:  Fernand Fredy RAMAN, MD  Cardiologist:  Denyse Fernand, MD      History of Present Illness: Michelle Walker is a 63 y.o. female who presents for  Chief Complaint  Patient presents with   Follow-up    4 weeks follow up / hospital follow up    Still feels tired after last night, after going to  ER with shortness of breath, felt dizzy, sweating. Then EMS came passed out and had chest pain afterward, and seen in ER. Sent here for evaluation. Discussed cath with patient and has agreed to it.      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 Wide excision/ RT, T1a,N0. ER/PR pos, Her 2.neg. Tamoxifen  x 6 months, d/c secondary to vasomotor sympotms.    Flu 10/08/2022   Headache    HTN (hypertension) 09/20/2017   Hypertension    Hypokalemia 10/10/2022   Hypoxia 02/27/2022   Last Assessment & Plan: Formatting of this note might be different from the original. 63 year old female with history of hypertension, gout, breast cancer left mastectomy on oral hormonal therapy, smoker, alcohol user on daily basis came to the emergency room after passing out.  Fell off from the bar stool. Loss of consciousness is unknown.  Patient found to be hypoxic in the ER.  On room air sats   Migraine 09/20/2017   Multifocal pneumonia 10/08/2022   Pain in joint of right hip 09/10/2021   Peripheral neuropathy    Personal history of radiation therapy 2016   RIGHT lumpectomy   Post-operative state 04/12/2022   Respiratory failure (HCC) 10/06/2022   Thyroid  disease      Past Surgical History:  Procedure Laterality Date   ANTERIOR CRUCIATE LIGAMENT REPAIR Right    BREAST BIOPSY Right 07/17/2015   INVASIVE LOBULAR CARCINOMA, CLASSIC TYPE (1.5 MM), ARISING IN A    BREAST BIOPSY Left 09/19/2020   us   bx 3 areas/ 1oc q clip/ 12 oc vision clip, axilla lymph node hydromark shape 3/ path pending   BREAST LUMPECTOMY Right 08/22/2015   BREAST LUMPECTOMY WITH SENTINEL LYMPH NODE BIOPSY Right 08/22/2015   Procedure: BREAST LUMPECTOMY WITH SENTINEL LYMPH NODE BX;  Surgeon: Louanne KANDICE Muse, MD;  Location: [facility];  Service: General;  Laterality: Right;   COLONOSCOPY W/ POLYPECTOMY  2014   COLONOSCOPY WITH PROPOFOL  N/A 04/16/2019   Procedure: COLONOSCOPY WITH BIOPSIES;  Surgeon: Jinny Carmine, MD;  Location: [facility];  Service: Endoscopy;  Laterality: N/A;   DILATION AND CURETTAGE OF UTERUS     20 years ago   LAPAROSCOPIC VAGINAL HYSTERECTOMY WITH SALPINGO OOPHORECTOMY Bilateral 04/12/2022   Procedure: LAPAROSCOPIC ASSISTED VAGINAL HYSTERECTOMY WITH SALPINGO OOPHORECTOMY;  Surgeon: Janit Alm Agent, MD;  Location: [facility];  Service: Gynecology;  Laterality: Bilateral;   MASTECTOMY Left    2021   NOVASURE ABLATION     POLYPECTOMY N/A 04/16/2019   Procedure: POLYPECTOMY;  Surgeon: Jinny Carmine, MD;  Location: [facility];  Service: Endoscopy;  Laterality: N/A;   PORTACATH PLACEMENT Right 12/01/2020   Procedure: INSERTION PORT-A-CATH;  Surgeon: Lane Shope, MD;  Location: [facility];  Service: General;  Laterality: Right;   TOTAL MASTECTOMY Left 05/06/2021   Procedure: modified radical mastectomy;  Surgeon: Lane Shope, MD;  Location: [facility];  Service: General;  Laterality: Left;  Provider requesting 1.5 hours / 90 minutes for procedure     Current Outpatient Medications  Medication Sig Dispense Refill   albuterol  (VENTOLIN  HFA) 108 (90 Base) MCG/ACT inhaler Inhale 2 puffs into the lungs every 6 (six) hours as needed for wheezing or shortness of breath. 8 g 2   allopurinol  (ZYLOPRIM ) 100 MG tablet Take 100 mg by mouth at bedtime.     aspirin  EC 81 MG tablet Take 1 tablet (81 mg total) by mouth daily. Swallow whole. 180 tablet 1   atorvastatin  (LIPITOR) 10 MG  tablet Take 10 mg by mouth daily.     DULoxetine  (CYMBALTA ) 60 MG capsule TAKE 1 CAPSULE(60 MG) BY MOUTH DAILY 30 capsule 1   letrozole  (FEMARA ) 2.5 MG tablet TAKE 1 TABLET(2.5 MG) BY MOUTH DAILY 90 tablet 1   losartan -hydrochlorothiazide  (HYZAAR) 100-12.5 MG tablet TAKE 1 TABLET BY MOUTH DAILY 90 tablet 1   pregabalin  (LYRICA ) 75 MG capsule TAKE 1 CAPSULE(75 MG) BY MOUTH TWICE DAILY 90 capsule 1   sotalol  (BETAPACE ) 120 MG tablet Take 1 tablet (120 mg total) by mouth 2 (two) times daily. 60 tablet 11   spironolactone  (ALDACTONE ) 25 MG tablet TAKE 1 TABLET(25 MG) BY MOUTH DAILY 90 tablet 3   No current facility-administered medications for this visit.    Allergies:   Triamcinolone and Tape    Social History:   reports that she has been smoking cigarettes. She has a 30 pack-year smoking history. She has never used smokeless tobacco. She reports current alcohol use of about 2.0 standard drinks of alcohol per week. She reports that she does not use drugs.   Family History:  family history includes Cancer in her sister; Diabetes in her brother and sister; Stroke in her mother and sister.    ROS:     Review of Systems  Constitutional: Negative.   HENT: Negative.    Eyes: Negative.   Respiratory: Negative.    Gastrointestinal: Negative.   Genitourinary: Negative.   Musculoskeletal: Negative.   Skin: Negative.   Neurological: Negative.   Endo/Heme/Allergies: Negative.   Psychiatric/Behavioral: Negative.    All other systems reviewed and are negative.     All other systems are reviewed and negative.    PHYSICAL EXAM: VS:  BP 106/70   Pulse 79   Ht 5' 6 (1.676 m)   Wt 197 lb (89.4 kg)   LMP 01/05/2017   SpO2 90%   BMI 31.80 kg/m  , BMI Body mass index is 31.8 kg/m. Last weight:  Wt Readings from Last 3 Encounters:  09/18/24 197 lb (89.4 kg)  09/17/24 191 lb (86.6 kg)  08/15/24 202 lb (91.6 kg)     Physical Exam Constitutional:      Appearance: Normal appearance.   Cardiovascular:     Rate and Rhythm: Normal rate and regular rhythm.     Heart sounds: Normal heart sounds.  Pulmonary:     Effort: Pulmonary effort is normal.     Breath sounds: Normal breath sounds.  Musculoskeletal:     Right lower leg: No edema.     Left lower leg: No edema.  Neurological:     Mental Status: She is alert.       EKG: sinus tachycardia, qtc 444  Recent Labs: 06/07/2024: ALT 13 09/17/2024: BUN 25; Creatinine, Ser 1.09; Hemoglobin 14.3; Platelets 256; Potassium 3.6; Sodium 134    Lipid Panel    Component Value Date/Time  CHOL 161 01/29/2021 0827   TRIG 119 01/29/2021 0827   HDL 42 01/29/2021 0827   CHOLHDL 3.8 01/29/2021 0827   VLDL 24 01/29/2021 0827   LDLCALC 95 01/29/2021 0827      Other studies Reviewed: Additional studies/ records that were reviewed today include:  Review of the above records demonstrates:       No data to display            ASSESSMENT AND PLAN:    ICD-10-CM   1. Other chest pain  R07.89 Procedural/ Surgical Case Request: LEFT HEART CATH AND CORONARY ANGIOGRAPHY with possible coronary intervention   Tightness in chest and h/o 60% distal CAD on CCTA, and syncope.    2. Coronary artery disease involving native coronary artery of native heart without angina pectoris  I25.10 Procedural/ Surgical Case Request: LEFT HEART CATH AND CORONARY ANGIOGRAPHY with possible coronary intervention    3. Primary hypertension  I10 Procedural/ Surgical Case Request: LEFT HEART CATH AND CORONARY ANGIOGRAPHY with possible coronary intervention    4. Mixed hyperlipidemia  E78.2 Procedural/ Surgical Case Request: LEFT HEART CATH AND CORONARY ANGIOGRAPHY with possible coronary intervention    5. Tobacco smoker, 1 pack of cigarettes or less per day  F17.210 Procedural/ Surgical Case Request: LEFT HEART CATH AND CORONARY ANGIOGRAPHY with possible coronary intervention    6. Syncope, unspecified syncope type  R55 Procedural/ Surgical Case  Request: LEFT HEART CATH AND CORONARY ANGIOGRAPHY with possible coronary intervention   Stress test was normal 7/25, but had CCTA 7/35 showing distal 60% RCA, mild LAD/LCX 675 ca score. Then had chest pain pressure likeand syncope. Advise cath.    7. Unstable angina (HCC)  I20.0 Procedural/ Surgical Case Request: LEFT HEART CATH AND CORONARY ANGIOGRAPHY with possible coronary intervention   set up cath    8. Other chest pain  R07.89 Procedural/ Surgical Case Request: LEFT HEART CATH AND CORONARY ANGIOGRAPHY with possible coronary intervention    9. Unstable angina (HCC)  I20.0 Procedural/ Surgical Case Request: LEFT HEART CATH AND CORONARY ANGIOGRAPHY with possible coronary intervention       Problem List Items Addressed This Visit       Cardiovascular and Mediastinum   Primary hypertension (Chronic)   Relevant Orders   Procedural/ Surgical Case Request: LEFT HEART CATH AND CORONARY ANGIOGRAPHY with possible coronary intervention   Coronary artery disease involving native coronary artery of native heart without angina pectoris   Relevant Orders   Procedural/ Surgical Case Request: LEFT HEART CATH AND CORONARY ANGIOGRAPHY with possible coronary intervention   Unstable angina (HCC)   Relevant Orders   Procedural/ Surgical Case Request: LEFT HEART CATH AND CORONARY ANGIOGRAPHY with possible coronary intervention     Other   Mixed hyperlipidemia   Relevant Orders   Procedural/ Surgical Case Request: LEFT HEART CATH AND CORONARY ANGIOGRAPHY with possible coronary intervention   Tobacco smoker, 1 pack of cigarettes or less per day   Relevant Orders   Procedural/ Surgical Case Request: LEFT HEART CATH AND CORONARY ANGIOGRAPHY with possible coronary intervention   Syncope   Relevant Orders   Procedural/ Surgical Case Request: LEFT HEART CATH AND CORONARY ANGIOGRAPHY with possible coronary intervention   Other chest pain - Primary   Relevant Orders   Procedural/ Surgical Case Request:  LEFT HEART CATH AND CORONARY ANGIOGRAPHY with possible coronary intervention       Disposition:   Return in about 1 week (around 09/25/2024) for set up cardiac cath and f/u.  Total time spent: 50 minutes  Signed,  Denyse Bathe, MD  09/18/2024 4:08 PM    [facility]

## 2024-09-18 NOTE — Discharge Instructions (Signed)
 Your evaluation in the emergency department was overall reassuring.  We did see evidence of mild dehydration, and you received IV fluid.  I am unsure as to the exact cause of your near syncopal episode, but it is possible you had a vasovagal episode or a run of SVT.  Please follow-up with your cardiologist today as already planned, and return to the emergency department with any new or worsening symptoms.

## 2024-09-22 ENCOUNTER — Other Ambulatory Visit: Payer: Self-pay | Admitting: Cardiovascular Disease

## 2024-09-24 ENCOUNTER — Ambulatory Visit

## 2024-09-24 ENCOUNTER — Encounter: Payer: Self-pay | Admitting: Gastroenterology

## 2024-09-24 ENCOUNTER — Ambulatory Visit
Admission: RE | Admit: 2024-09-24 | Discharge: 2024-09-24 | Disposition: A | Discharge status: Home / Self Care | Attending: Gastroenterology | Admitting: Gastroenterology

## 2024-09-24 ENCOUNTER — Encounter
Admission: RE | Disposition: A | Discharge status: Home / Self Care | Payer: Self-pay | Source: Home / Self Care | Attending: Gastroenterology

## 2024-09-24 DIAGNOSIS — K641 Second degree hemorrhoids: Secondary | ICD-10-CM | POA: Diagnosis not present

## 2024-09-24 DIAGNOSIS — D124 Benign neoplasm of descending colon: Secondary | ICD-10-CM

## 2024-09-24 DIAGNOSIS — Z79899 Other long term (current) drug therapy: Secondary | ICD-10-CM | POA: Insufficient documentation

## 2024-09-24 DIAGNOSIS — F17210 Nicotine dependence, cigarettes, uncomplicated: Secondary | ICD-10-CM | POA: Diagnosis not present

## 2024-09-24 DIAGNOSIS — Z1211 Encounter for screening for malignant neoplasm of colon: Secondary | ICD-10-CM | POA: Diagnosis not present

## 2024-09-24 DIAGNOSIS — D649 Anemia, unspecified: Secondary | ICD-10-CM | POA: Diagnosis not present

## 2024-09-24 DIAGNOSIS — J449 Chronic obstructive pulmonary disease, unspecified: Secondary | ICD-10-CM | POA: Insufficient documentation

## 2024-09-24 DIAGNOSIS — F32A Depression, unspecified: Secondary | ICD-10-CM | POA: Diagnosis not present

## 2024-09-24 DIAGNOSIS — E782 Mixed hyperlipidemia: Secondary | ICD-10-CM

## 2024-09-24 DIAGNOSIS — R0789 Other chest pain: Secondary | ICD-10-CM

## 2024-09-24 DIAGNOSIS — K5730 Diverticulosis of large intestine without perforation or abscess without bleeding: Secondary | ICD-10-CM | POA: Insufficient documentation

## 2024-09-24 DIAGNOSIS — K635 Polyp of colon: Secondary | ICD-10-CM | POA: Diagnosis not present

## 2024-09-24 DIAGNOSIS — I2510 Atherosclerotic heart disease of native coronary artery without angina pectoris: Secondary | ICD-10-CM | POA: Diagnosis not present

## 2024-09-24 DIAGNOSIS — I10 Essential (primary) hypertension: Secondary | ICD-10-CM | POA: Insufficient documentation

## 2024-09-24 DIAGNOSIS — D123 Benign neoplasm of transverse colon: Secondary | ICD-10-CM | POA: Insufficient documentation

## 2024-09-24 DIAGNOSIS — I200 Unstable angina: Secondary | ICD-10-CM

## 2024-09-24 DIAGNOSIS — Z860101 Personal history of adenomatous and serrated colon polyps: Secondary | ICD-10-CM | POA: Diagnosis present

## 2024-09-24 DIAGNOSIS — R55 Syncope and collapse: Secondary | ICD-10-CM

## 2024-09-24 DIAGNOSIS — M1990 Unspecified osteoarthritis, unspecified site: Secondary | ICD-10-CM | POA: Diagnosis not present

## 2024-09-24 DIAGNOSIS — Z860100 Personal history of colon polyps, unspecified: Secondary | ICD-10-CM

## 2024-09-24 HISTORY — PX: COLONOSCOPY: SHX5424

## 2024-09-24 SURGERY — COLONOSCOPY
Anesthesia: General

## 2024-09-24 MED ORDER — SODIUM CHLORIDE 0.9 % IV SOLN
INTRAVENOUS | Status: DC
  Administered 2024-09-24: 20 mL/h via INTRAVENOUS

## 2024-09-24 MED ORDER — LIDOCAINE HCL (PF) 2 % IJ SOLN
INTRAMUSCULAR | Status: AC
  Filled 2024-09-24: qty 20

## 2024-09-24 MED ORDER — PROPOFOL 10 MG/ML IV BOLUS
INTRAVENOUS | Status: DC | PRN
  Administered 2024-09-24: 100 mg via INTRAVENOUS
  Administered 2024-09-24: 120 ug/kg/min via INTRAVENOUS

## 2024-09-24 MED ORDER — GLYCOPYRROLATE 0.2 MG/ML IJ SOLN
INTRAMUSCULAR | Status: DC | PRN
  Administered 2024-09-24: .2 mg via INTRAVENOUS

## 2024-09-24 MED ORDER — PROPOFOL 1000 MG/100ML IV EMUL
INTRAVENOUS | Status: AC
  Filled 2024-09-24: qty 100

## 2024-09-24 MED ORDER — LIDOCAINE HCL (CARDIAC) PF 100 MG/5ML IV SOSY
PREFILLED_SYRINGE | INTRAVENOUS | Status: DC | PRN
  Administered 2024-09-24: 100 mg via INTRAVENOUS

## 2024-09-24 NOTE — Op Note (Signed)
 [facility] Gastroenterology Patient Name: Michelle Walker Procedure Date: 09/24/2024 8:16 AM MRN: [id number] Account #: [id number] Date of Birth: [birth year] Admit Type: Outpatient Age: 63 Room: [facility] ENDO ROOM 4 Gender: Female Note Status: Finalized Instrument Name: Colon Scope [id number] Procedure:             Colonoscopy Indications:           High risk colon cancer surveillance: Personal history                         of colonic polyps Providers:             Rogelia Copping MD, MD Referring MD:          Fredy CANDIE Bathe, MD (Referring MD) Medicines:             Propofol  per Anesthesia Complications:         No immediate complications. Procedure:             Pre-Anesthesia Assessment:                        - Prior to the procedure, a History and Physical was                         performed, and patient medications and allergies were                         reviewed. The patient's tolerance of previous                         anesthesia was also reviewed. The risks and benefits                         of the procedure and the sedation options and risks                         were discussed with the patient. All questions were                         answered, and informed consent was obtained. Prior                         Anticoagulants: The patient has taken no anticoagulant                         or antiplatelet agents. ASA Grade Assessment: II - A                         patient with mild systemic disease. After reviewing                         the risks and benefits, the patient was deemed in                         satisfactory condition to undergo the procedure.                        After obtaining informed consent, the colonoscope was  passed under direct vision. Throughout the procedure,                         the patient's blood pressure, pulse, and oxygen                         saturations were monitored continuously. The                          Colonoscope was introduced through the anus and                         advanced to the the cecum, identified by appendiceal                         orifice and ileocecal valve. The colonoscopy was                         performed without difficulty. The patient tolerated                         the procedure well. The quality of the bowel                         preparation was excellent. Findings:      The perianal and digital rectal examinations were normal.      A 4 mm polyp was found in the transverse colon. The polyp was sessile.       The polyp was removed with a cold snare. Resection and retrieval were       complete.      An 8 mm polyp was found in the sigmoid colon. The polyp was sessile. The       polyp was removed with a cold snare. Resection and retrieval were       complete.      Two sessile polyps were found in the descending colon. The polyps were 3       to 4 mm in size. These polyps were removed with a cold snare. Resection       and retrieval were complete.      Multiple small-mouthed diverticula were found in the entire colon.      Non-bleeding internal hemorrhoids were found during retroflexion. The       hemorrhoids were Grade II (internal hemorrhoids that prolapse but reduce       spontaneously). Impression:            - One 4 mm polyp in the transverse colon, removed with                         a cold snare. Resected and retrieved.                        - One 8 mm polyp in the sigmoid colon, removed with a                         cold snare. Resected and retrieved.                        - Two 3 to 4 mm polyps in the  descending colon,                         removed with a cold snare. Resected and retrieved.                        - Diverticulosis in the entire examined colon.                        - Non-bleeding internal hemorrhoids. Recommendation:        - Discharge patient to home.                        - Resume previous diet.                         - Continue present medications.                        - Await pathology results.                        - Repeat colonoscopy in 5 years for surveillance. Procedure Code(s):     --- Professional ---                        [phone], Colonoscopy, flexible; with removal of                         tumor(s), polyp(s), or other lesion(s) by snare                         technique Diagnosis Code(s):     --- Professional ---                        Z86.010, Personal history of colonic polyps                        D12.5, Benign neoplasm of sigmoid colon CPT copyright 2022 American Medical Association. All rights reserved. The codes documented in this report are preliminary and upon coder review may  be revised to meet current compliance requirements. Rogelia Copping MD, MD 09/24/2024 8:45:08 AM This report has been signed electronically. Number of Addenda: 0 Note Initiated On: 09/24/2024 8:16 AM Scope Withdrawal Time: 0 hours 8 minutes 39 seconds  Total Procedure Duration: 0 hours 14 minutes 38 seconds  Estimated Blood Loss:  Estimated blood loss: none.      [facility]

## 2024-09-24 NOTE — Anesthesia Preprocedure Evaluation (Signed)
 Anesthesia Evaluation  Patient identified by MRN, date of birth, ID band Patient awake    Reviewed: Allergy & Precautions, NPO status , Patient's Chart, lab work & pertinent test results  Airway Mallampati: II  TM Distance: >3 FB Neck ROM: full    Dental  (+) Teeth Intact   Pulmonary neg pulmonary ROS, COPD, Current Smoker and Patient abstained from smoking.   Pulmonary exam normal  + decreased breath sounds      Cardiovascular Exercise Tolerance: Good hypertension, Pt. on medications + CAD  negative cardio ROS Normal cardiovascular exam Rhythm:Regular Rate:Normal     Neuro/Psych  Headaches   Depression    negative neurological ROS  negative psych ROS   GI/Hepatic negative GI ROS, Neg liver ROS,,,  Endo/Other  negative endocrine ROSdiabetes, Well Controlled, Type 2  Class 3 obesity  Renal/GU negative Renal ROS  negative genitourinary   Musculoskeletal  (+) Arthritis ,    Abdominal  (+) + obese  Peds negative pediatric ROS (+)  Hematology negative hematology ROS (+) Blood dyscrasia, anemia   Anesthesia Other Findings Past Medical History: No date: Anemia No date: Arthritis 08/2015: Breast cancer (HCC)     Comment:  1.5 mm invasive lobular cancer, 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               Wide excision/ RT, T1a,N0. ER/PR pos, Her 2.neg.               Tamoxifen  x 6 months, d/c secondary to vasomotor               sympotms.  10/08/2022: Flu No date: Headache 09/20/2017: HTN (hypertension) No date: Hypertension 10/10/2022: Hypokalemia 02/27/2022: Hypoxia     Comment:  Last Assessment & Plan: Formatting of this note might be              different from the original. 63 year old female with               history of hypertension, gout, breast cancer left               mastectomy on oral hormonal therapy, smoker, alcohol user              on daily basis came to the emergency room after passing               out. Clemens  off from the bar stool. Loss of consciousness               is unknown. Patient found to be hypoxic in the ER. On               room air sats 09/20/2017: Migraine 10/08/2022: Multifocal pneumonia 09/10/2021: Pain in joint of right hip No date: Peripheral neuropathy 2016: Personal history of radiation therapy     Comment:  RIGHT lumpectomy 04/12/2022: Post-operative state 10/06/2022: Respiratory failure (HCC) No date: Thyroid  disease  Past Surgical History: No date: ANTERIOR CRUCIATE LIGAMENT REPAIR; Right 07/17/2015: BREAST BIOPSY; Right     Comment:  INVASIVE LOBULAR CARCINOMA, CLASSIC TYPE (1.5 MM),               ARISING IN A  09/19/2020: BREAST BIOPSY; Left     Comment:  us  bx 3 areas/ 1oc q clip/ 12 oc vision clip, axilla               lymph node hydromark shape 3/ path pending 08/22/2015: BREAST LUMPECTOMY; Right 08/22/2015: BREAST LUMPECTOMY WITH SENTINEL LYMPH NODE BIOPSY; Right  Comment:  Procedure: BREAST LUMPECTOMY WITH SENTINEL LYMPH NODE               BX;  Surgeon: Louanne KANDICE Muse, MD;  Location: [facility]               ORS;  Service: General;  Laterality: Right; 2014: COLONOSCOPY W/ POLYPECTOMY 04/16/2019: COLONOSCOPY WITH PROPOFOL ; N/A     Comment:  Procedure: COLONOSCOPY WITH BIOPSIES;  Surgeon: Jinny Carmine, MD;  Location: [facility];  Service:               Endoscopy;  Laterality: N/A; No date: DILATION AND CURETTAGE OF UTERUS     Comment:  20 years ago 04/12/2022: LAPAROSCOPIC VAGINAL HYSTERECTOMY WITH SALPINGO  OOPHORECTOMY; Bilateral     Comment:  Procedure: LAPAROSCOPIC ASSISTED VAGINAL HYSTERECTOMY               WITH SALPINGO OOPHORECTOMY;  Surgeon: Janit Alm Agent,              MD;  Location: [facility] ORS;  Service: Gynecology;                Laterality: Bilateral; No date: MASTECTOMY; Left     Comment:  2021 No date: NOVASURE ABLATION 04/16/2019: POLYPECTOMY; N/A     Comment:  Procedure: POLYPECTOMY;  Surgeon: Jinny Carmine,  MD;                Location: [facility];  Service: Endoscopy;                Laterality: N/A; 12/01/2020: PORTACATH PLACEMENT; Right     Comment:  Procedure: INSERTION PORT-A-CATH;  Surgeon: Lane Shope, MD;  Location: [facility] ORS;  Service: General;                Laterality: Right; 05/06/2021: TOTAL MASTECTOMY; Left     Comment:  Procedure: modified radical mastectomy;  Surgeon:               Lane Shope, MD;  Location: [facility] ORS;  Service:               General;  Laterality: Left;  Provider requesting 1.5               hours / 90 minutes for procedure  BMI    Body Mass Index: 31.64 kg/m      Reproductive/Obstetrics negative OB ROS                              Anesthesia Physical Anesthesia Plan  ASA: 3  Anesthesia Plan: General   Post-op Pain Management:    Induction: Intravenous  PONV Risk Score and Plan: Propofol  infusion and TIVA  Airway Management Planned: Natural Airway and Nasal Cannula  Additional Equipment:   Intra-op Plan:   Post-operative Plan:   Informed Consent: I have reviewed the patients History and Physical, chart, labs and discussed the procedure including the risks, benefits and alternatives for the proposed anesthesia with the patient or authorized representative who has indicated his/her understanding and acceptance.     Dental Advisory Given  Plan Discussed with: CRNA  Anesthesia Plan Comments:         Anesthesia Quick Evaluation

## 2024-09-24 NOTE — Anesthesia Postprocedure Evaluation (Signed)
 Anesthesia Post Note  Patient: Michelle Walker  Procedure(s) Performed: COLONOSCOPY  Anesthesia Type: General Anesthetic complications: no   No notable events documented.   Last Vitals:  Vitals:   09/24/24 0847 09/24/24 0857  BP: 127/82 (!) 136/90  Pulse: (!) 119 (!) 110  Resp: (!) 27 (!) 24  Temp: (!) 35.6 C   SpO2: 100% 99%    Last Pain:  Vitals:   09/24/24 0857  TempSrc:   PainSc: 0-No pain                 VAN STAVEREN,Milon Dethloff

## 2024-09-24 NOTE — Transfer of Care (Signed)
 Immediate Anesthesia Transfer of Care Note  Patient: Michelle Walker  Procedure(s) Performed: COLONOSCOPY  Patient Location: PACU and Endoscopy Unit  Anesthesia Type:General  Level of Consciousness: awake  Airway & Oxygen Therapy: Patient Spontanous Breathing  Post-op Assessment: Report given to RN  Post vital signs: Reviewed and stable  Last Vitals:  Vitals Value Taken Time  BP 127/82 09/24/24 08:47  Temp    Pulse 118 09/24/24 08:47  Resp 27 09/24/24 08:47  SpO2 100 % 09/24/24 08:47  Vitals shown include unfiled device data.  Last Pain:  Vitals:   09/24/24 0757  TempSrc: Temporal  PainSc: 0-No pain         Complications: No notable events documented.

## 2024-09-24 NOTE — H&P (Signed)
 Rogelia Copping, MD [facility] [address] [place], [place] [address] Phone:[phone] Fax : [phone]  Primary Care Physician:  Fernand Fredy RAMAN, MD Primary Gastroenterologist:  Dr. Copping  Pre-Procedure History & Physical: HPI:  Michelle Walker is a 63 y.o. female is here for an colonoscopy.   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 Wide excision/ RT, T1a,N0. ER/PR pos, Her 2.neg. Tamoxifen  x 6 months, d/c secondary to vasomotor sympotms.    Flu 10/08/2022   Headache    HTN (hypertension) 09/20/2017   Hypertension    Hypokalemia 10/10/2022   Hypoxia 02/27/2022   Last Assessment & Plan: Formatting of this note might be different from the original. 63 year old female with history of hypertension, gout, breast cancer left mastectomy on oral hormonal therapy, smoker, alcohol user on daily basis came to the emergency room after passing out.  Fell off from the bar stool. Loss of consciousness is unknown.  Patient found to be hypoxic in the ER.  On room air sats   Migraine 09/20/2017   Multifocal pneumonia 10/08/2022   Pain in joint of right hip 09/10/2021   Peripheral neuropathy    Personal history of radiation therapy 2016   RIGHT lumpectomy   Post-operative state 04/12/2022   Respiratory failure (HCC) 10/06/2022   Thyroid  disease     Past Surgical History:  Procedure Laterality Date   ANTERIOR CRUCIATE LIGAMENT REPAIR Right    BREAST BIOPSY Right 07/17/2015   INVASIVE LOBULAR CARCINOMA, CLASSIC TYPE (1.5 MM), ARISING IN A    BREAST BIOPSY Left 09/19/2020   us  bx 3 areas/ 1oc q clip/ 12 oc vision clip, axilla lymph node hydromark shape 3/ path pending   BREAST LUMPECTOMY Right 08/22/2015   BREAST LUMPECTOMY WITH SENTINEL LYMPH NODE BIOPSY Right 08/22/2015   Procedure: BREAST LUMPECTOMY WITH SENTINEL LYMPH NODE BX;  Surgeon: Louanne KANDICE Muse, MD;  Location: [facility]  ORS;  Service: General;  Laterality: Right;   COLONOSCOPY W/ POLYPECTOMY  2014   COLONOSCOPY WITH PROPOFOL  N/A 04/16/2019   Procedure: COLONOSCOPY WITH BIOPSIES;  Surgeon: Copping Rogelia, MD;  Location: [facility];  Service: Endoscopy;  Laterality: N/A;   DILATION AND CURETTAGE OF UTERUS     20 years ago   LAPAROSCOPIC VAGINAL HYSTERECTOMY WITH SALPINGO OOPHORECTOMY Bilateral 04/12/2022   Procedure: LAPAROSCOPIC ASSISTED VAGINAL HYSTERECTOMY WITH SALPINGO OOPHORECTOMY;  Surgeon: Janit Alm Agent, MD;  Location: [facility] ORS;  Service: Gynecology;  Laterality: Bilateral;   MASTECTOMY Left    2021   NOVASURE ABLATION     POLYPECTOMY N/A 04/16/2019   Procedure: POLYPECTOMY;  Surgeon: Copping Rogelia, MD;  Location: [facility];  Service: Endoscopy;  Laterality: N/A;   PORTACATH PLACEMENT Right 12/01/2020   Procedure: INSERTION PORT-A-CATH;  Surgeon: Lane Shope, MD;  Location: [facility] OR[facility];  Service: General;  Laterality: Right;   TOTAL MASTECTOMY Left 05/06/2021   Procedure: modified radical mastectomy;  Surgeon: Lane Shope, MD;  Location: [facility] OR[facility];  Service: General;  Laterality: Left;  Provider requesting 1.5 hours / 90 minutes for procedure    Prior to Admission medications   Medication Sig Start Date End Date Taking? Authorizing Provider  albuterol  (VENTOLIN  HFA) 108 (90 Base) MCG/ACT inhaler Inhale 2 puffs into the lungs every 6 (six) hours as needed for wheezing or shortness of breath. 10/10/22  Yes Lenon Marien CROME, MD  allopurinol  (ZYLOPRIM ) 100  MG tablet Take 100 mg by mouth at bedtime.   Yes [provider]  aspirin  EC 81 MG tablet Take 1 tablet (81 mg total) by mouth daily. Swallow whole. 05/31/23  Yes Scoggins, Amber, NP  atorvastatin  (LIPITOR) 10 MG tablet Take 10 mg by mouth daily. 05/27/22  Yes [provider]  DULoxetine  (CYMBALTA ) 60 MG capsule TAKE 1 CAPSULE(60 MG) BY MOUTH DAILY 07/30/24  Yes Melanee Annah BROCKS, MD  letrozole  (FEMARA ) 2.5 MG  tablet TAKE 1 TABLET(2.5 MG) BY MOUTH DAILY 06/27/24  Yes Rao, Archana C, MD  losartan -hydrochlorothiazide  (HYZAAR) 100-12.5 MG tablet TAKE 1 TABLET BY MOUTH DAILY 03/01/24  Yes Fernand Denyse LABOR, MD  pregabalin  (LYRICA ) 75 MG capsule TAKE 1 CAPSULE(75 MG) BY MOUTH TWICE DAILY 08/15/24  Yes Melanee Annah BROCKS, MD  sotalol  (BETAPACE ) 120 MG tablet Take 1 tablet (120 mg total) by mouth 2 (two) times daily. 12/06/23 12/05/24 Yes Fernand Denyse LABOR, MD  spironolactone  (ALDACTONE ) 25 MG tablet TAKE 1 TABLET(25 MG) BY MOUTH DAILY 07/05/24  Yes Fernand Denyse LABOR, MD    Allergies as of 06/12/2024 - Review Complete 06/07/2024  Allergen Reaction Noted   Triamcinolone Other (See Comments) 12/10/2019   Tape Rash 08/18/2015    Family History  Problem Relation Age of Onset   Stroke Mother    Cancer Sister        sarcoma on arm; chemo   Diabetes Sister    Stroke Sister    Diabetes Brother    Breast cancer Neg Hx    Ovarian cancer Neg Hx    Colon cancer Neg Hx    Heart disease Neg Hx     Social History   Socioeconomic History   Marital status: Married    Spouse name: Vick   Number of children: 1   Years of education: Not on file   Highest education level: Not on file  Occupational History   Not on file  Tobacco Use   Smoking status: Every Day    Current packs/day: 0.75    Average packs/day: 0.8 packs/day for 40.0 years (30.0 ttl pk-yrs)    Types: Cigarettes   Smokeless tobacco: Never  Vaping Use   Vaping status: Never Used  Substance and Sexual Activity   Alcohol use: Yes    Alcohol/week: 2.0 standard drinks of alcohol    Types: 2 Standard drinks or equivalent per week    Comment: BEER 1-2 QD   Drug use: No   Sexual activity: Yes    Birth control/protection: Surgical    Comment: Hysterectomy  Other Topics Concern   Not on file  Social History Narrative   Daughter and 3 kids in home, her Brother is in the home as well.   Social Drivers of Corporate Investment Banker Strain: Low Risk   (08/07/2024)   Received from [facility]   Overall Financial Resource Strain (CARDIA)    Difficulty of Paying Living Expenses: Not hard at all  Food Insecurity: No Food Insecurity (08/07/2024)   Received from [facility]   Hunger Vital Sign    Within the past 12 months, you worried that your food would run out before you got the money to buy more.: Never true    Within the past 12 months, the food you bought just didn't last and you didn't have money to get more.: Never true  Transportation Needs: No Transportation Needs (08/07/2024)   Received from [facility]   PRAPARE -  Transportation    In the past 12 months, has lack of transportation kept you from medical appointments or from getting medications?: No    Lack of Transportation (Non-Medical): No  Physical Activity: Not on file  Stress: Not on file  Social Connections: Not on file  Intimate Partner Violence: Not on file    Review of Systems: See HPI, otherwise negative ROS  Physical Exam: BP (!) 136/95   Pulse (!) 108   Temp (!) 96.3 F (35.7 C) (Temporal)   Resp 20   Ht 5' 6 (1.676 m)   Wt 88.9 kg   LMP 01/05/2017   SpO2 100%   BMI 31.64 kg/m  General:   Alert,  pleasant and cooperative in NAD Head:  Normocephalic and atraumatic. Neck:  Supple; no masses or thyromegaly. Lungs:  Clear throughout to auscultation.    Heart:  Regular rate and rhythm. Abdomen:  Soft, nontender and nondistended. Normal bowel sounds, without guarding, and without rebound.   Neurologic:  Alert and  oriented x4;  grossly normal neurologically.  Impression/Plan: Dayane G Omahoney is here for an colonoscopy to be performed for a history of adenomatous polyps on 2020   Risks, benefits, limitations, and alternatives regarding  colonoscopy have been reviewed with the patient.  Questions have been answered.  All parties agreeable.   Rogelia Copping, MD  09/24/2024, 8:18 AM

## 2024-09-25 ENCOUNTER — Ambulatory Visit (INDEPENDENT_AMBULATORY_CARE_PROVIDER_SITE_OTHER): Admitting: Internal Medicine

## 2024-09-25 ENCOUNTER — Encounter: Payer: Self-pay | Admitting: Internal Medicine

## 2024-09-25 VITALS — BP 120/72 | HR 118 | Ht 66.0 in | Wt 202.4 lb

## 2024-09-25 DIAGNOSIS — I471 Supraventricular tachycardia, unspecified: Secondary | ICD-10-CM | POA: Insufficient documentation

## 2024-09-25 DIAGNOSIS — E1169 Type 2 diabetes mellitus with other specified complication: Secondary | ICD-10-CM

## 2024-09-25 DIAGNOSIS — I10 Essential (primary) hypertension: Secondary | ICD-10-CM | POA: Diagnosis not present

## 2024-09-25 DIAGNOSIS — M1A09X0 Idiopathic chronic gout, multiple sites, without tophus (tophi): Secondary | ICD-10-CM

## 2024-09-25 DIAGNOSIS — R7303 Prediabetes: Secondary | ICD-10-CM | POA: Diagnosis not present

## 2024-09-25 DIAGNOSIS — E782 Mixed hyperlipidemia: Secondary | ICD-10-CM | POA: Diagnosis not present

## 2024-09-25 DIAGNOSIS — Z860100 Personal history of colon polyps, unspecified: Secondary | ICD-10-CM

## 2024-09-25 DIAGNOSIS — Z13820 Encounter for screening for osteoporosis: Secondary | ICD-10-CM | POA: Insufficient documentation

## 2024-09-25 LAB — SURGICAL PATHOLOGY

## 2024-09-25 SURGERY — LEFT HEART CATH AND CORONARY ANGIOGRAPHY
Anesthesia: Moderate Sedation | Laterality: Left

## 2024-09-25 NOTE — Progress Notes (Signed)
 Established Patient Office Visit  Subjective:  Patient ID: Michelle Walker, female    DOB: 03/17/1961  Age: 63 y.o. MRN: [id number]  Chief Complaint  Patient presents with   Follow-up    3 month follow up    Patient is here today for follow up. She reports feeling well today. Patient reports taking her medications as prescribed.  Patient went to hospital 09/17/24 for near syncope event with hypotension; She had reported straining to pass stool when her symptoms began. Her Cardiologist stopped her amlodipine . Her BP is within normal range today. Her Cardiologist recommended cardiac catherization. She had Ca score of 635 in 05/2023. Today her pulse is 118. She denies chest pain, shortness of breath, palpitations. She endorses feeling tired. Recommend patient to get cardiac cath scheduled.  She is due for routine blood work; patient is not fasting today but will collect blood work today.  She needs low dose chest CT completed. It was ordered at her last visit. She is also do for DEXA scan. Will order. Patient had colonoscopy completed yesterday and she reports it was normal.  Patient reports she is still smoking and not ready to quit smoking at this time. Reinforced smoking cessation and healthy diet and exercise as tolerated.     No other concerns at this time.   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 Wide excision/ RT, T1a,N0. ER/PR pos, Her 2.neg. Tamoxifen  x 6 months, d/c secondary to vasomotor sympotms.    Flu 10/08/2022   Headache    HTN (hypertension) 09/20/2017   Hypertension    Hypokalemia 10/10/2022   Hypoxia 02/27/2022   Last Assessment & Plan: Formatting of this note might be different from the original. 63 year old female with history of hypertension, gout, breast cancer left mastectomy on oral hormonal therapy, smoker, alcohol user on daily basis came to the  emergency room after passing out.  Fell off from the bar stool. Loss of consciousness is unknown.  Patient found to be hypoxic in the ER.  On room air sats   Migraine 09/20/2017   Multifocal pneumonia 10/08/2022   Pain in joint of right hip 09/10/2021   Peripheral neuropathy    Personal history of radiation therapy 2016   RIGHT lumpectomy   Post-operative state 04/12/2022   Respiratory failure (HCC) 10/06/2022   Thyroid  disease     Past Surgical History:  Procedure Laterality Date   ANTERIOR CRUCIATE LIGAMENT REPAIR Right    BREAST BIOPSY Right 07/17/2015   INVASIVE LOBULAR CARCINOMA, CLASSIC TYPE (1.5 MM), ARISING IN A    BREAST BIOPSY Left 09/19/2020   us  bx 3 areas/ 1oc q clip/ 12 oc vision clip, axilla lymph node hydromark shape 3/ path pending   BREAST LUMPECTOMY Right 08/22/2015   BREAST LUMPECTOMY WITH SENTINEL LYMPH NODE BIOPSY Right 08/22/2015   Procedure: BREAST LUMPECTOMY WITH SENTINEL LYMPH NODE BX;  Surgeon: Louanne KANDICE Muse, MD;  Location: [facility];  Service: General;  Laterality: Right;   COLONOSCOPY N/A 09/24/2024   Procedure: COLONOSCOPY;  Surgeon: Jinny Carmine, MD;  Location: [facility];  Service: Endoscopy;  Laterality: N/A;   COLONOSCOPY W/ POLYPECTOMY  2014   COLONOSCOPY WITH PROPOFOL  N/A 04/16/2019   Procedure: COLONOSCOPY WITH BIOPSIES;  Surgeon: Jinny Carmine, MD;  Location: [facility];  Service: Endoscopy;  Laterality: N/A;   DILATION AND CURETTAGE OF UTERUS  20 years ago   LAPAROSCOPIC VAGINAL HYSTERECTOMY WITH SALPINGO OOPHORECTOMY Bilateral 04/12/2022   Procedure: LAPAROSCOPIC ASSISTED VAGINAL HYSTERECTOMY WITH SALPINGO OOPHORECTOMY;  Surgeon: Janit Alm Agent, MD;  Location: [facility];  Service: Gynecology;  Laterality: Bilateral;   MASTECTOMY Left    2021   NOVASURE ABLATION     POLYPECTOMY N/A 04/16/2019   Procedure: POLYPECTOMY;  Surgeon: Jinny Carmine, MD;  Location: [facility];  Service: Endoscopy;  Laterality: N/A;    PORTACATH PLACEMENT Right 12/01/2020   Procedure: INSERTION PORT-A-CATH;  Surgeon: Lane Shope, MD;  Location: [facility];  Service: General;  Laterality: Right;   TOTAL MASTECTOMY Left 05/06/2021   Procedure: modified radical mastectomy;  Surgeon: Lane Shope, MD;  Location: [facility];  Service: General;  Laterality: Left;  Provider requesting 1.5 hours / 90 minutes for procedure    Social History   Socioeconomic History   Marital status: Married    Spouse name: Vick   Number of children: 1   Years of education: Not on file   Highest education level: Not on file  Occupational History   Not on file  Tobacco Use   Smoking status: Every Day    Current packs/day: 0.75    Average packs/day: 0.8 packs/day for 40.0 years (30.0 ttl pk-yrs)    Types: Cigarettes   Smokeless tobacco: Never  Vaping Use   Vaping status: Never Used  Substance and Sexual Activity   Alcohol use: Yes    Alcohol/week: 2.0 standard drinks of alcohol    Types: 2 Standard drinks or equivalent per week    Comment: BEER 1-2 QD   Drug use: No   Sexual activity: Yes    Birth control/protection: Surgical    Comment: Hysterectomy  Other Topics Concern   Not on file  Social History Narrative   Daughter and 3 kids in home, her Brother is in the home as well.   Social Drivers of Corporate Investment Banker Strain: Low Risk  (08/07/2024)   Received from [facility]   Overall Financial Resource Strain (CARDIA)    Difficulty of Paying Living Expenses: Not hard at all  Food Insecurity: No Food Insecurity (08/07/2024)   Received from [facility]   Hunger Vital Sign    Within the past 12 months, you worried that your food would run out before you got the money to buy more.: Never true    Within the past 12 months, the food you bought just didn't last and you didn't have money to get more.: Never true  Transportation Needs: No Transportation Needs (08/07/2024)   Received from  [facility]    In the past 12 months, has lack of transportation kept you from medical appointments or from getting medications?: No    Lack of Transportation (Non-Medical): No  Physical Activity: Not on file  Stress: Not on file  Social Connections: Not on file  Intimate Partner Violence: Not on file    Family History  Problem Relation Age of Onset   Stroke Mother    Cancer Sister        sarcoma on arm; chemo   Diabetes Sister    Stroke Sister    Diabetes Brother    Breast cancer Neg Hx    Ovarian cancer Neg Hx    Colon cancer Neg Hx    Heart disease Neg Hx     Allergies  Allergen Reactions   Triamcinolone Other (See  Comments)    Hypopigmentation s/p steroid injection   Tape Rash    SURGICAL TAPE AFTER BREAST BIOPSY    Outpatient Medications Prior to Visit  Medication Sig   albuterol  (VENTOLIN  HFA) 108 (90 Base) MCG/ACT inhaler Inhale 2 puffs into the lungs every 6 (six) hours as needed for wheezing or shortness of breath.   allopurinol  (ZYLOPRIM ) 100 MG tablet Take 100 mg by mouth at bedtime.   aspirin  EC 81 MG tablet Take 1 tablet (81 mg total) by mouth daily. Swallow whole.   atorvastatin  (LIPITOR) 10 MG tablet Take 10 mg by mouth daily.   DULoxetine  (CYMBALTA ) 60 MG capsule TAKE 1 CAPSULE(60 MG) BY MOUTH DAILY   letrozole  (FEMARA ) 2.5 MG tablet TAKE 1 TABLET(2.5 MG) BY MOUTH DAILY   losartan -hydrochlorothiazide  (HYZAAR) 100-12.5 MG tablet TAKE 1 TABLET BY MOUTH DAILY   pregabalin  (LYRICA ) 75 MG capsule TAKE 1 CAPSULE(75 MG) BY MOUTH TWICE DAILY   sotalol  (BETAPACE ) 120 MG tablet Take 1 tablet (120 mg total) by mouth 2 (two) times daily.   spironolactone  (ALDACTONE ) 25 MG tablet TAKE 1 TABLET(25 MG) BY MOUTH DAILY   No facility-administered medications prior to visit.    Review of Systems  Constitutional:  Positive for malaise/fatigue. Negative for chills and fever.  HENT: Negative.  Negative for congestion and sore  throat.   Eyes: Negative.  Negative for blurred vision and pain.  Respiratory: Negative.  Negative for cough and shortness of breath.   Cardiovascular: Negative.  Negative for chest pain, palpitations and leg swelling.  Gastrointestinal: Negative.  Negative for abdominal pain, blood in stool, constipation, diarrhea, heartburn, melena, nausea and vomiting.  Genitourinary: Negative.  Negative for dysuria, flank pain, frequency and urgency.  Musculoskeletal: Negative.  Negative for joint pain and myalgias.  Skin: Negative.   Neurological: Negative.  Negative for dizziness, tingling, sensory change, weakness and headaches.  Endo/Heme/Allergies: Negative.   Psychiatric/Behavioral: Negative.  Negative for depression and suicidal ideas. The patient is not nervous/anxious.        Objective:   BP 120/72   Pulse (!) 118   Ht 5' 6 (1.676 m)   Wt 202 lb 6.4 oz (91.8 kg)   LMP 01/05/2017   SpO2 97%   BMI 32.67 kg/m   Vitals:   09/25/24 1524  BP: 120/72  Pulse: (!) 118  Height: 5' 6 (1.676 m)  Weight: 202 lb 6.4 oz (91.8 kg)  SpO2: 97%  BMI (Calculated): 32.68    Physical Exam Vitals and nursing note reviewed.  Constitutional:      Appearance: Normal appearance.  HENT:     Head: Normocephalic and atraumatic.     Nose: Nose normal.     Mouth/Throat:     Mouth: Mucous membranes are moist.     Pharynx: Oropharynx is clear.  Eyes:     Conjunctiva/sclera: Conjunctivae normal.     Pupils: Pupils are equal, round, and reactive to light.  Cardiovascular:     Rate and Rhythm: Regular rhythm. Tachycardia present.     Pulses: Normal pulses.     Heart sounds: Normal heart sounds. No murmur heard. Pulmonary:     Effort: Pulmonary effort is normal.     Breath sounds: Normal breath sounds. No wheezing.  Abdominal:     General: Bowel sounds are normal.     Palpations: Abdomen is soft.     Tenderness: There is no abdominal tenderness. There is no right CVA tenderness or left CVA  tenderness.  Musculoskeletal:  General: Normal range of motion.     Cervical back: Normal range of motion.     Right lower leg: No edema.     Left lower leg: No edema.  Skin:    General: Skin is warm and dry.  Neurological:     General: No focal deficit present.     Mental Status: She is alert and oriented to person, place, and time.  Psychiatric:        Mood and Affect: Mood normal.        Behavior: Behavior normal.      No results found for any visits on 09/25/24.  Recent Results (from the past 2160 hours)  Basic metabolic panel     Status: Abnormal   Collection Time: 09/17/24  9:41 PM  Result Value Ref Range   Sodium 134 (L) 135 - 145 mmol/L   Potassium 3.6 3.5 - 5.1 mmol/L   Chloride 99 98 - 111 mmol/L   CO2 20 (L) 22 - 32 mmol/L   Glucose, Bld 114 (H) 70 - 99 mg/dL    Comment: Glucose reference range applies only to samples taken after fasting for at least 8 hours.   BUN 25 (H) 8 - 23 mg/dL   Creatinine, Ser 8.90 (H) 0.44 - 1.00 mg/dL   Calcium  10.1 8.9 - 10.3 mg/dL   GFR, Estimated 58 (L) >60 mL/min    Comment: (NOTE) Calculated using the CKD-EPI Creatinine Equation (2021)    Anion gap 15 5 - 15    Comment: Performed at [facility], [address] [place], [place] [address]  CBC     Status: Abnormal   Collection Time: 09/17/24  9:41 PM  Result Value Ref Range   WBC 10.1 4.0 - 10.5 K/uL   RBC 5.29 (H) 3.87 - 5.11 MIL/uL   Hemoglobin 14.3 12.0 - 15.0 g/dL   HCT 56.7 63.9 - 53.9 %   MCV 81.7 80.0 - 100.0 fL   MCH 27.0 26.0 - 34.0 pg   MCHC 33.1 30.0 - 36.0 g/dL   RDW 86.5 88.4 - 84.4 %   Platelets 256 150 - 400 K/uL   nRBC 0.0 0.0 - 0.2 %    Comment: Performed at [facility], [address] [place], [place] [address]  Troponin T, High Sensitivity     Status: None   Collection Time: 09/17/24  9:41 PM  Result Value Ref Range   Troponin T High Sensitivity <15 0 - 19 ng/L    Comment: (NOTE) Biotin concentrations > 1000 ng/mL  falsely decrease TnT results.  Serial cardiac troponin measurements are suggested.  Refer to the Links section for chest pain algorithms and additional  guidance. Performed at [facility], [address] [place], [place] [address]   Troponin T, High Sensitivity     Status: None   Collection Time: 09/17/24 11:29 PM  Result Value Ref Range   Troponin T High Sensitivity <15 0 - 19 ng/L    Comment: (NOTE) Biotin concentrations > 1000 ng/mL falsely decrease TnT results.  Serial cardiac troponin measurements are suggested.  Refer to the Links section for chest pain algorithms and additional  guidance. Performed at [facility], [address] [place], [place] [address]   Resp panel by RT-PCR (RSV, Flu A&B, Covid) Anterior Nasal Swab     Status: None   Collection Time: 09/17/24 11:54 PM   Specimen: Anterior Nasal Swab  Result Value Ref Range   SARS Coronavirus 2 by  RT PCR NEGATIVE NEGATIVE    Comment: (NOTE) SARS-CoV-2 target nucleic acids are NOT DETECTED.  The SARS-CoV-2 RNA is generally detectable in upper respiratory specimens during the acute phase of infection. The lowest concentration of SARS-CoV-2 viral copies this assay can detect is 138 copies/mL. A negative result does not preclude SARS-Cov-2 infection and should not be used as the sole basis for treatment or other patient management decisions. A negative result may occur with  improper specimen collection/handling, submission of specimen other than nasopharyngeal swab, presence of viral mutation(s) within the areas targeted by this assay, and inadequate number of viral copies(<138 copies/mL). A negative result must be combined with clinical observations, patient history, and epidemiological information. The expected result is Negative.  Fact Sheet for Patients:  bloggercourse.com  Fact Sheet for Healthcare Providers:  seriousbroker.it  This  test is no t yet approved or cleared by the [place]  FDA and  has been authorized for detection and/or diagnosis of SARS-CoV-2 by FDA under an Emergency Use Authorization (EUA). This EUA will remain  in effect (meaning this test can be used) for the duration of the COVID-19 declaration under Section 564(b)(1) of the Act, 21 U.S.C.section 360bbb-3(b)(1), unless the authorization is terminated  or revoked sooner.       Influenza A by PCR NEGATIVE NEGATIVE   Influenza B by PCR NEGATIVE NEGATIVE    Comment: (NOTE) The Xpert Xpress SARS-CoV-2/FLU/RSV plus assay is intended as an aid in the diagnosis of influenza from Nasopharyngeal swab specimens and should not be used as a sole basis for treatment. Nasal washings and aspirates are unacceptable for Xpert Xpress SARS-CoV-2/FLU/RSV testing.  Fact Sheet for Patients: bloggercourse.com  Fact Sheet for Healthcare Providers: seriousbroker.it  This test is not yet approved or cleared by the [place]  FDA and has been authorized for detection and/or diagnosis of SARS-CoV-2 by FDA under an Emergency Use Authorization (EUA). This EUA will remain in effect (meaning this test can be used) for the duration of the COVID-19 declaration under Section 564(b)(1) of the Act, 21 U.S.C. section 360bbb-3(b)(1), unless the authorization is terminated or revoked.     Resp Syncytial Virus by PCR NEGATIVE NEGATIVE    Comment: (NOTE) Fact Sheet for Patients: bloggercourse.com  Fact Sheet for Healthcare Providers: seriousbroker.it  This test is not yet approved or cleared by the [place]  FDA and has been authorized for detection and/or diagnosis of SARS-CoV-2 by FDA under an Emergency Use Authorization (EUA). This EUA will remain in effect (meaning this test can be used) for the duration of the COVID-19 declaration under Section 564(b)(1) of  the Act, 21 U.S.C. section 360bbb-3(b)(1), unless the authorization is terminated or revoked.  Performed at [facility], [address] [place], [place] [address]   Surgical pathology     Status: None   Collection Time: 09/24/24 12:00 AM  Result Value Ref Range   SURGICAL PATHOLOGY      SURGICAL PATHOLOGY [facility] [address], [address] [place], [place] [address] Telephone [phone] or [phone] Fax [phone]  REPORT OF SURGICAL PATHOLOGY   Accession #: [id number] Patient Name: KAINA, ORENGO Visit # : [id number]  MRN: [id number] Physician: Jinny Carmine DOB/Age 02-28-1961 (Age: 64) Gender: F Collected Date: 09/24/2024 Received Date: 09/24/2024  FINAL DIAGNOSIS       1. Descending Colon Polyp, x2 cold snare :       POLYPOID COLONIC MUCOSA WITHOUT SIGNIFICANT DIAGNOSTIC ALTERATION.      NEGATIVE FOR DYSPLASIA.  2. Transverse Colon Polyp, cold snare :       TUBULAR ADENOMA.      NEGATIVE FOR HIGH-GRADE DYSPLASIA.       3. Sigmoid  Colon Polyp, cold snare :       HYPERPLASTIC POLYP.      NEGATIVE FOR DYSPLASIA.       ELECTRONIC SIGNATURE : Pepper Dutton Md, Pathologist, Electronic Signature  MICROSCOPIC DESCRIPTION  CASE COMMENTS STAINS USED IN DIAGNOSIS: H&E H&E H&E    CLINICAL HISTOR Y  SPECIMEN(S) OBTAINED 1. Descending Colon Polyp, X2 Cold Snare 2. Transverse Colon Polyp, Cold Snare 3. Sigmoid  Colon Polyp, Cold Snare  SPECIMEN COMMENTS: SPECIMEN CLINICAL INFORMATION: 1. Personal history of colon polyps, polyps, diverticulosis    Gross Description 1. Received in formalin is a tan, soft tissue fragment that is submitted in toto.Size:  1.0 cm, 1 block submitted. 2. Received in formalin is a tan, soft tissue fragment that is submitted in toto.Size:  0.6 cm, 1 block submitted. 3. Received in formalin are tan, soft tissue fragments that are submitted in toto.Number:  3  Size:  0.1-0.9 cm,  1  block.mb 09-24-24        Report signed out from the following location(s) [place]. [facility] [address] [place], [place] [address] CLIA #: [id number]  [facility] [address] [place], [place] [address] CLIA #: [id number]       Assessment & Plan:  Continue medications as prescribed. Recommend patient get cardiac cath scheduled. Routine blood work collected today and FU with patient on results. DEXA scan ordered. Patient to get low dose chest CT scheduled. Keep specialists appointments as scheduled. Discussed going to ED for chest pain, shortness of breath. Reinforced need for smoking cessation, healthy diet and exercising as tolerated. Problem List Items Addressed This Visit     Gout   History of colonic polyps   Primary hypertension (Chronic)   Combined hyperlipidemia associated with type 2 diabetes mellitus (HCC)   Other Visit Diagnoses       SVT (supraventricular tachycardia)    -  Primary     Screening for osteoporosis       Relevant Orders   HM DEXA SCAN (Completed)     Prediabetes           Return in about 6 weeks (around 11/06/2024).   Total time spent: 30 minutes. This time includes review of previous notes and results and patient face to face interaction during today's visit.    FERNAND FREDY RAMAN, MD  09/25/2024   This document may have been prepared by [facility] Voice Recognition software and as such may include unintentional dictation errors.

## 2024-09-26 ENCOUNTER — Ambulatory Visit: Payer: Self-pay | Admitting: Gastroenterology

## 2024-09-26 LAB — LIPID PANEL W/O CHOL/HDL RATIO
Cholesterol, Total: 148 mg/dL (ref 100–199)
HDL: 51 mg/dL (ref >39)
LDL Chol Calc (NIH): 53 mg/dL (ref 0–99)
Triglycerides: 286 mg/dL — ABNORMAL HIGH (ref 0–149)
VLDL Cholesterol Cal: 44 mg/dL — ABNORMAL HIGH (ref 5–40)

## 2024-09-26 LAB — CBC WITH DIFFERENTIAL/PLATELET
Basophils Absolute: 0 x10E3/uL (ref 0.0–0.2)
Basos: 0 %
EOS (ABSOLUTE): 0.1 x10E3/uL (ref 0.0–0.4)
Eos: 1 %
Hematocrit: 39.2 % (ref 34.0–46.6)
Hemoglobin: 12.5 g/dL (ref 11.1–15.9)
Immature Grans (Abs): 0.1 x10E3/uL (ref 0.0–0.1)
Immature Granulocytes: 1 %
Lymphocytes Absolute: 2 x10E3/uL (ref 0.7–3.1)
Lymphs: 22 %
MCH: 26.8 pg (ref 26.6–33.0)
MCHC: 31.9 g/dL (ref 31.5–35.7)
MCV: 84 fL (ref 79–97)
Monocytes Absolute: 0.7 x10E3/uL (ref 0.1–0.9)
Monocytes: 8 %
Neutrophils Absolute: 6 x10E3/uL (ref 1.4–7.0)
Neutrophils: 68 %
Platelets: 224 x10E3/uL (ref 150–450)
RBC: 4.67 x10E6/uL (ref 3.77–5.28)
RDW: 13.1 % (ref 11.7–15.4)
WBC: 8.8 x10E3/uL (ref 3.4–10.8)

## 2024-09-26 LAB — TSH+T4F+T3FREE
Free T4: 1.26 ng/dL (ref 0.82–1.77)
T3, Free: 3.9 pg/mL (ref 2.0–4.4)
TSH: 0.021 u[IU]/mL — ABNORMAL LOW (ref 0.450–4.500)

## 2024-09-26 LAB — CMP14+EGFR
ALT: 14 IU/L (ref 0–32)
AST: 22 IU/L (ref 0–40)
Albumin: 4 g/dL (ref 3.9–4.9)
Alkaline Phosphatase: 79 IU/L (ref 49–135)
BUN/Creatinine Ratio: 16 (ref 12–28)
BUN: 13 mg/dL (ref 8–27)
Bilirubin Total: 0.2 mg/dL (ref 0.0–1.2)
CO2: 20 mmol/L (ref 20–29)
Calcium: 9.8 mg/dL (ref 8.7–10.3)
Chloride: 105 mmol/L (ref 96–106)
Creatinine, Ser: 0.83 mg/dL (ref 0.57–1.00)
Globulin, Total: 2.3 g/dL (ref 1.5–4.5)
Glucose: 86 mg/dL (ref 70–99)
Potassium: 4 mmol/L (ref 3.5–5.2)
Sodium: 138 mmol/L (ref 134–144)
Total Protein: 6.3 g/dL (ref 6.0–8.5)
eGFR: 80 mL/min/1.73 (ref >59)

## 2024-09-26 LAB — HEMOGLOBIN A1C
Est. average glucose Bld gHb Est-mCnc: 131 mg/dL
Hgb A1c MFr Bld: 6.2 % — ABNORMAL HIGH (ref 4.8–5.6)

## 2024-09-26 LAB — URIC ACID: Uric Acid: 4.6 mg/dL (ref 3.0–7.2)

## 2024-09-28 NOTE — Addendum Note (Signed)
 Addended by: FERNAND FREDY RAMAN on: 09/28/2024 09:44 AM   Modules accepted: Orders

## 2024-09-28 NOTE — Progress Notes (Signed)
Pt informed

## 2024-10-02 ENCOUNTER — Other Ambulatory Visit: Payer: Self-pay | Admitting: Cardiovascular Disease

## 2024-10-02 DIAGNOSIS — Z720 Tobacco use: Secondary | ICD-10-CM

## 2024-10-02 DIAGNOSIS — R002 Palpitations: Secondary | ICD-10-CM

## 2024-10-02 DIAGNOSIS — E782 Mixed hyperlipidemia: Secondary | ICD-10-CM

## 2024-10-02 DIAGNOSIS — I10 Essential (primary) hypertension: Secondary | ICD-10-CM

## 2024-10-02 DIAGNOSIS — I471 Supraventricular tachycardia, unspecified: Secondary | ICD-10-CM

## 2024-10-04 ENCOUNTER — Ambulatory Visit

## 2024-10-17 ENCOUNTER — Other Ambulatory Visit: Payer: Self-pay

## 2024-10-18 MED ORDER — ATORVASTATIN CALCIUM 10 MG PO TABS: 10.0000 mg | ORAL_TABLET | Freq: Every day | ORAL | 3 refills | Status: AC

## 2024-10-26 ENCOUNTER — Other Ambulatory Visit: Payer: Self-pay | Admitting: Oncology

## 2024-10-29 ENCOUNTER — Encounter: Payer: Self-pay | Admitting: Oncology

## 2024-11-06 ENCOUNTER — Ambulatory Visit: Admitting: Internal Medicine

## 2024-11-20 ENCOUNTER — Telehealth: Payer: Self-pay | Admitting: Internal Medicine

## 2024-11-20 NOTE — Telephone Encounter (Signed)
 PT called in regards to refills on a ALLOPURINOL  100MG  90 day supply Send to [facility] in [place]

## 2024-11-21 ENCOUNTER — Other Ambulatory Visit: Payer: Self-pay | Admitting: Internal Medicine

## 2024-11-21 MED ORDER — ALLOPURINOL 100 MG PO TABS: 100.0000 mg | ORAL_TABLET | Freq: Every day | ORAL | 3 refills | Status: AC

## 2024-12-18 ENCOUNTER — Ambulatory Visit: Admitting: Cardiovascular Disease

## 2025-02-13 ENCOUNTER — Inpatient Hospital Stay: Admitting: Oncology
